# Patient Record
Sex: Male | Born: 1935 | Race: White | Hispanic: No | Smoking: Former smoker
Health system: Southern US, Community
[De-identification: ages and names within clinical notes are randomized; demographics above are authoritative.]

## PROBLEM LIST (undated history)

## (undated) DIAGNOSIS — I10 Essential (primary) hypertension: Secondary | ICD-10-CM

## (undated) DIAGNOSIS — M48062 Spinal stenosis, lumbar region with neurogenic claudication: Secondary | ICD-10-CM

## (undated) DIAGNOSIS — C44601 Unspecified malignant neoplasm of skin of unspecified upper limb, including shoulder: Secondary | ICD-10-CM

## (undated) DIAGNOSIS — E1140 Type 2 diabetes mellitus with diabetic neuropathy, unspecified: Secondary | ICD-10-CM

## (undated) DIAGNOSIS — R3915 Urgency of urination: Secondary | ICD-10-CM

## (undated) DIAGNOSIS — Z87442 Personal history of urinary calculi: Secondary | ICD-10-CM

## (undated) DIAGNOSIS — R600 Localized edema: Secondary | ICD-10-CM

## (undated) DIAGNOSIS — M549 Dorsalgia, unspecified: Secondary | ICD-10-CM

## (undated) DIAGNOSIS — E7800 Pure hypercholesterolemia, unspecified: Secondary | ICD-10-CM

## (undated) DIAGNOSIS — M4800 Spinal stenosis, site unspecified: Secondary | ICD-10-CM

## (undated) DIAGNOSIS — M1990 Unspecified osteoarthritis, unspecified site: Secondary | ICD-10-CM

## (undated) DIAGNOSIS — R252 Cramp and spasm: Secondary | ICD-10-CM

## (undated) HISTORY — DX: Unspecified malignant neoplasm of skin of unspecified upper limb, including shoulder: C44.601

## (undated) HISTORY — DX: Spinal stenosis, lumbar region with neurogenic claudication: M48.062

## (undated) HISTORY — DX: Spinal stenosis, site unspecified: M48.00

## (undated) HISTORY — DX: Type 2 diabetes mellitus with diabetic neuropathy, unspecified: E11.40

## (undated) HISTORY — DX: Essential (primary) hypertension: I10

## (undated) HISTORY — PX: SKIN CANCER EXCISION: SHX779

---

## 2000-09-23 ENCOUNTER — Ambulatory Visit (HOSPITAL_COMMUNITY): Admission: RE | Admit: 2000-09-23 | Discharge: 2000-09-23 | Payer: Self-pay | Admitting: *Deleted

## 2008-06-26 LAB — HM COLONOSCOPY

## 2011-10-14 ENCOUNTER — Encounter: Payer: Self-pay | Admitting: Emergency Medicine

## 2011-10-14 ENCOUNTER — Emergency Department (HOSPITAL_COMMUNITY): Payer: Worker's Compensation

## 2011-10-14 ENCOUNTER — Emergency Department (HOSPITAL_COMMUNITY)
Admission: EM | Admit: 2011-10-14 | Discharge: 2011-10-14 | Disposition: A | Discharge status: Home / Self Care | Payer: Worker's Compensation | Attending: Orthopedic Surgery | Admitting: Orthopedic Surgery

## 2011-10-14 DIAGNOSIS — E119 Type 2 diabetes mellitus without complications: Secondary | ICD-10-CM | POA: Insufficient documentation

## 2011-10-14 DIAGNOSIS — S8990XA Unspecified injury of unspecified lower leg, initial encounter: Secondary | ICD-10-CM | POA: Insufficient documentation

## 2011-10-14 DIAGNOSIS — M79609 Pain in unspecified limb: Secondary | ICD-10-CM | POA: Insufficient documentation

## 2011-10-14 DIAGNOSIS — S9030XA Contusion of unspecified foot, initial encounter: Secondary | ICD-10-CM | POA: Insufficient documentation

## 2011-10-14 DIAGNOSIS — Z794 Long term (current) use of insulin: Secondary | ICD-10-CM | POA: Insufficient documentation

## 2011-10-14 DIAGNOSIS — E7800 Pure hypercholesterolemia, unspecified: Secondary | ICD-10-CM | POA: Insufficient documentation

## 2011-10-14 DIAGNOSIS — W240XXA Contact with lifting devices, not elsewhere classified, initial encounter: Secondary | ICD-10-CM | POA: Insufficient documentation

## 2011-10-14 DIAGNOSIS — S97109A Crushing injury of unspecified toe(s), initial encounter: Secondary | ICD-10-CM

## 2011-10-14 DIAGNOSIS — Y990 Civilian activity done for income or pay: Secondary | ICD-10-CM | POA: Insufficient documentation

## 2011-10-14 DIAGNOSIS — Z7982 Long term (current) use of aspirin: Secondary | ICD-10-CM | POA: Insufficient documentation

## 2011-10-14 DIAGNOSIS — S99919A Unspecified injury of unspecified ankle, initial encounter: Secondary | ICD-10-CM | POA: Insufficient documentation

## 2011-10-14 DIAGNOSIS — Z79899 Other long term (current) drug therapy: Secondary | ICD-10-CM | POA: Insufficient documentation

## 2011-10-14 DIAGNOSIS — S9780XA Crushing injury of unspecified foot, initial encounter: Secondary | ICD-10-CM | POA: Insufficient documentation

## 2011-10-14 DIAGNOSIS — S92919A Unspecified fracture of unspecified toe(s), initial encounter for closed fracture: Secondary | ICD-10-CM | POA: Insufficient documentation

## 2011-10-14 DIAGNOSIS — W208XXA Other cause of strike by thrown, projected or falling object, initial encounter: Secondary | ICD-10-CM | POA: Insufficient documentation

## 2011-10-14 DIAGNOSIS — R609 Edema, unspecified: Secondary | ICD-10-CM | POA: Insufficient documentation

## 2011-10-14 HISTORY — DX: Pure hypercholesterolemia, unspecified: E78.00

## 2011-10-14 LAB — PROTIME-INR
INR: 1.03 (ref 0.00–1.49)
Prothrombin Time: 13.7 seconds (ref 11.6–15.2)

## 2011-10-14 LAB — CBC
MCH: 28.8 pg (ref 26.0–34.0)
MCHC: 34.8 g/dL (ref 30.0–36.0)
Platelets: 196 10*3/uL (ref 150–400)
RBC: 4.51 MIL/uL (ref 4.22–5.81)
RDW: 14.3 % (ref 11.5–15.5)
WBC: 10.7 10*3/uL — ABNORMAL HIGH (ref 4.0–10.5)

## 2011-10-14 LAB — BASIC METABOLIC PANEL
Calcium: 10.9 mg/dL — ABNORMAL HIGH (ref 8.4–10.5)
GFR calc Af Amer: >90 mL/min (ref >90)
GFR calc non Af Amer: 90 mL/min — ABNORMAL LOW (ref >90)
Potassium: 4.3 mEq/L (ref 3.5–5.1)
Sodium: 140 mEq/L (ref 135–145)

## 2011-10-14 LAB — APTT: aPTT: 36 seconds (ref 24–37)

## 2011-10-14 MED ORDER — SODIUM CHLORIDE 0.9 % IV SOLN
Freq: Once | INTRAVENOUS | Status: AC
  Administered 2011-10-14: 18:00:00 via INTRAVENOUS

## 2011-10-14 MED ORDER — CLINDAMYCIN PHOSPHATE 600 MG/50ML IV SOLN
600.0000 mg | Freq: Once | INTRAVENOUS | Status: AC
  Administered 2011-10-14: 600 mg via INTRAVENOUS
  Filled 2011-10-14: qty 50

## 2011-10-14 NOTE — ED Notes (Signed)
Post op boot has been applied

## 2011-10-14 NOTE — Consult Note (Signed)
77 y/o male sustained injury to his left great toe when a forklift malfunctioned at work and caused a crush injury to his foot. Pt states his toe was under the skid of the forklift for 4-5 minutes. Pt denies any other injury s/p this accident. States he has history of neuropathy to his feet due to diabetes. PMH: diabetes, neuropathy, hyperlipidemia Allergies: none Meds: see mar ROS: pain with weight bearing left lower leg, hx of neuropathy bilateral legs,  PE: alert and appropriate 77 y/o male in no acute distress. Vitals stable Left great toe with laceration just proximal to the nail bed his toe, vertical laceration down to IP joint with a 'U" shaped laceration surrounding the nail of the toe, moderate ecchymosis to distal end of the toe and to MTP areas of toes 1-3. Pt had a digital block prior to arrival thus baseline sensation unable to be determined. nv intact to other toes with mild decreased sensation. Pt walking on his heel with mild bleeding present X-rays: comminuted fracture of the left great toe of the distal phalanx Assessment: complex laceration and comminuted fracture of the left great toe. Procedure: after sterile prep and consent pt had a repeat digital block of the left great toe, thorough irrigation with 500cc of saline to toe and laceration. Pt had 3 sutures of vicryl placed into nail bed to repair the nail bed. 4.0 prolene used to suture the superficial stellate laceration leaving some opening for drainage as needed. Sterile dressing applied Plan: follow up in our office in 2 days for a wound check. Pt received IV antibiotics in the ED and given scripts for oral antibiotics and pain medications.

## 2011-10-14 NOTE — ED Notes (Signed)
Pt here after having fork arm drop on foot; pt with large laceration to left great toe; bleeding controlled with dressing bruising noted

## 2011-10-14 NOTE — Progress Notes (Signed)
Orthopedic Tech Progress Note Patient Details:  Christian [name] [birth year] [id number]  Other Ortho Devices Type of Ortho Device: Postop boot Ortho Device Location: left foot Ortho Device Interventions: Application   Nikki Dom 10/14/2011, 6:55 PM

## 2011-10-14 NOTE — ED Provider Notes (Signed)
History     CSN: [id number]  Arrival date & time 10/14/11  1424   First MD Initiated Contact with Patient 10/14/11 1554      Chief Complaint  Patient presents with  . Laceration   Pleasant, 77 year old gentleman, who is a known diabetic. Sustained an injury to his left foot, today while he was at work and the fork arms from the forklift dropped and landed on his foot. There was bleeding noted. It was controlled. A dressing. Bruising. Also noted in triage. Patient states that his tetanus is up to date. He denies any other injuries. He denies any numbness, weakness or tingling. Patient did not attempt to ambulate after this happened. (Consider location/radiation/quality/duration/timing/severity/associated sxs/prior treatment) HPI  Past Medical History  Diagnosis Date  . Diabetes mellitus   . Hypercholesterolemia     History reviewed. No pertinent past surgical history.  History reviewed. No pertinent family history.  History  Substance Use Topics  . Smoking status: Never Smoker   . Smokeless tobacco: Not on file  . Alcohol Use: No      Review of Systems  All other systems reviewed and are negative.    Allergies  Review of patient's allergies indicates no known allergies.  Home Medications   Current Outpatient Rx  Name Route Sig Dispense Refill  . ASPIRIN EC 81 MG PO TBEC Oral Take 81 mg by mouth daily.      Marland Kitchen VITAMIN D 1000 UNITS PO TABS Oral Take 1,000 Units by mouth daily.      . OMEGA-3 FATTY ACIDS 1000 MG PO CAPS Oral Take 3 g by mouth 2 (two) times daily.      Marland Kitchen GLIMEPIRIDE 4 MG PO TABS Oral Take 4 mg by mouth daily before breakfast.      . INSULIN GLARGINE 100 UNIT/ML Dean SOLN Subcutaneous Inject 10 Units into the skin at bedtime.      Marland Kitchen LISINOPRIL 10 MG PO TABS Oral Take 10 mg by mouth daily.      Marland Kitchen METFORMIN HCL 1000 MG PO TABS Oral Take 1,000 mg by mouth 2 (two) times daily with a meal.      . ADULT MULTIVITAMIN W/MINERALS CH Oral Take 1 tablet by mouth  daily.      Marland Kitchen SIMVASTATIN 80 MG PO TABS Oral Take 80 mg by mouth at bedtime.        BP 161/59  Pulse 66  Temp(Src) 97.5 F (36.4 C) (Oral)  Resp 20  SpO2 99%  Physical Exam  Nursing note and vitals reviewed. Constitutional: He is oriented to person, place, and time. He appears well-developed and well-nourished.  HENT:  Head: Normocephalic and atraumatic.  Eyes: Conjunctivae and EOM are normal. Pupils are equal, round, and reactive to light.  Neck: Neck supple.  Cardiovascular: Normal rate and regular rhythm.  Exam reveals no gallop and no friction rub.   No murmur heard. Pulmonary/Chest: Breath sounds normal. He has no wheezes. He has no rales. He exhibits no tenderness.  Abdominal: Soft. Bowel sounds are normal. He exhibits no distension. There is no tenderness. There is no rebound and no guarding.  Musculoskeletal: Normal range of motion. He exhibits edema and tenderness.       Bruising, bleeding noted.   Neurological: He is alert and oriented to person, place, and time. No cranial nerve deficit. Coordination normal.  Skin: Skin is warm and dry. No rash noted.  Psychiatric: He has a normal mood and affect.    ED Course  Procedures (including critical care time)  Labs Reviewed - No data to display No results found.   No diagnosis found.    MDM  Pt is seen and examined;  Initial history and physical completed.  Will follow.  xrays ordered, Td up to date.  Did not want pain meds at thsi time      4:38 PM  Patient has returned from x-ray. Will thoroughly irrigate and cleanse wound with the assistant medical student with normal saline and Betadine.  Digital block for now.  May need ortho repair.  No results found for this or any previous visit. Dg Foot Complete Left  10/14/2011  *RADIOLOGY REPORT*  Clinical Data: Injury of left foot.  Soft tissue lacerations involving toes and distal foot with bruising.  LEFT FOOT - COMPLETE 3+ VIEW  Comparison: None.  Findings:  Nonaneurysmal arterial calcifications are present.  There is distal soft tissue injury involving the first toe.  There is fracture of the proximal lateral articular surface of the distal phalanx at the IP joint of the first toe.  There is a comminuted fracture of the distal phalangeal tuberosity of the distal phalanx of the first toe.  No opaque foreign body is evident.  There is narrowing of the lateral joint space of the first MTP joint with spurring.  No dislocation is evident.  Posterior and plantar calcaneal spurring is present.  Dorsal navicular spurring is present.  IMPRESSION: Fracture of proximal lateral articular surface of the distal phalanx at the IP joint of the first toe.  Comminuted fracture of the distal phalangeal tuberosity of distal phalanx of first toe.  Distal soft tissue injury laceration involving first toe.  First MTP joint osteoarthritic change.  Dorsal tarsal degenerative spurring.  Calcaneal spurring.  Original Report Authenticated By: Crawford Givens, M.D.    Will page ORTHO  Ortho contacted.  Repair done by ORtho PA, Dixon.  Rx and instructions given by PA.  Discharged by myself.   Angelino Rumery A. Patrica Duel, MD 10/15/11 1535

## 2013-02-21 ENCOUNTER — Other Ambulatory Visit: Payer: Self-pay | Admitting: Family Medicine

## 2013-02-21 ENCOUNTER — Other Ambulatory Visit: Payer: Self-pay | Admitting: Physician Assistant

## 2013-02-21 NOTE — Telephone Encounter (Signed)
Medication refilled per protocol. 

## 2013-03-03 ENCOUNTER — Other Ambulatory Visit: Payer: Self-pay | Admitting: Family Medicine

## 2013-03-03 ENCOUNTER — Ambulatory Visit (INDEPENDENT_AMBULATORY_CARE_PROVIDER_SITE_OTHER): Payer: Medicare Other | Admitting: Physician Assistant

## 2013-03-03 ENCOUNTER — Encounter: Payer: Self-pay | Admitting: Physician Assistant

## 2013-03-03 VITALS — BP 136/80 | HR 72 | Temp 97.2°F | Resp 18 | Ht 66.5 in | Wt 195.0 lb

## 2013-03-03 DIAGNOSIS — E119 Type 2 diabetes mellitus without complications: Secondary | ICD-10-CM

## 2013-03-03 DIAGNOSIS — E7800 Pure hypercholesterolemia, unspecified: Secondary | ICD-10-CM

## 2013-03-03 DIAGNOSIS — I10 Essential (primary) hypertension: Secondary | ICD-10-CM | POA: Insufficient documentation

## 2013-03-03 HISTORY — DX: Essential (primary) hypertension: I10

## 2013-03-03 MED ORDER — PIOGLITAZONE HCL 45 MG PO TABS: 45.0000 mg | ORAL_TABLET | Freq: Every day | ORAL | Status: DC

## 2013-03-03 MED ORDER — NIACIN ER (ANTIHYPERLIPIDEMIC) 500 MG PO TBCR: 500.0000 mg | EXTENDED_RELEASE_TABLET | Freq: Every day | ORAL | Status: DC

## 2013-03-03 NOTE — Progress Notes (Signed)
Patient ID: Christian [name] MRN: [id number], DOB: December 06, 1934, 78 y.o. Date of Encounter: @DATE @  Chief Complaint:  Chief Complaint  Patient presents with  . routine follow up and med refills    HPI: 78 y.o. year old male  presents for routine f/u.  1-Diabetes:  He did retire one month ago but his diet/exercise have not improved as he had said they would. At prior OVs with Dr Modesto Charon he had said his diet, exercise would improve once he retired. However, today says he "can do no outdoor activity b/c he is allergic to the sun". Has a stationary bike but he rarely uses it. Regarding diet, he says "tries but it is hard to stick to it".  Says he stopped Lantus 1 montha ago b/c didn't think he needed it. Says he was sometimes getting low BS readings at night early morning-in the 60s. So, he just quit the Lantus.  Now, he checks BS every morning and every night. Morning readings are now 160 to >200. Night readings are >200 quitte often.  He is taking other meds as directed. Says that sometimes (about once a week he may forget the night dose of hi med but o/w is taking as directed.   2-Lipids: Taking meds as directed. No adv effects.   3. HTN: Taking med as directed. No LE Edema or other adv effects.    Past Medical History  Diagnosis Date  . Diabetes mellitus   . Hypercholesterolemia   . HTN (hypertension) 03/03/2013     Home Meds: See attached medication section for current medication list. Any medications entered into computer today will not appear on this note's list. The medications listed below were entered prior to today. Current Outpatient Prescriptions on File Prior to Visit  Medication Sig Dispense Refill  . aspirin EC 81 MG tablet Take 81 mg by mouth daily.        . cholecalciferol (VITAMIN D) 1000 UNITS tablet Take 1,000 Units by mouth daily.        . fish oil-omega-3 fatty acids 1000 MG capsule Take 3 g by mouth 2 (two) times daily.        Marland Kitchen glimepiride (AMARYL) 4 MG tablet  Take 4 mg by mouth 2 (two) times daily.       Marland Kitchen lisinopril (PRINIVIL,ZESTRIL) 10 MG tablet Take 10 mg by mouth daily.        . metFORMIN (GLUCOPHAGE) 1000 MG tablet Take 1,000 mg by mouth 2 (two) times daily with a meal.        . Multiple Vitamin (MULITIVITAMIN WITH MINERALS) TABS Take 1 tablet by mouth daily.        . simvastatin (ZOCOR) 80 MG tablet TAKE ONE TABLET BY MOUTH AT BEDTIME  30 tablet  2  . insulin glargine (LANTUS) 100 UNIT/ML injection Inject 10 Units into the skin at bedtime.         No current facility-administered medications on file prior to visit.    Allergies: No Known Allergies  History   Social History  . Marital Status: Single    Spouse Name: N/A    Number of Children: N/A  . Years of Education: N/A   Occupational History  . Not on file.   Social History Main Topics  . Smoking status: Former Smoker    Quit date: 03/04/2003  . Smokeless tobacco: Never Used  . Alcohol Use: No  . Drug Use: No  . Sexually Active: Not on file   Other Topics  Concern  . Not on file   Social History Narrative  . No narrative on file    No family history on file.   Review of Systems:  See HPI for pertinent ROS. All other ROS negative.    Physical Exam: Blood pressure 136/80, pulse 72, temperature 97.2 F (36.2 C), temperature source Oral, resp. rate 18, height 5' 6.5" (1.689 m), weight 195 lb (88.451 kg)., Body mass index is 31.01 kg/(m^2). General: WNWD WM. Appears in no acute distress. Neck: Supple. No thyromegaly. No lymphadenopathy. No Carotid Bruits. Lungs: Clear bilaterally to auscultation without wheezes, rales, or rhonchi. Breathing is unlabored. Heart: RRR with S1 S2. No murmurs, rubs, or gallops. Abdomen: Soft, non-tender, non-distended with normoactive bowel sounds. No hepatomegaly. No rebound/guarding. No obvious abdominal masses. Musculoskeletal:  Strength and tone normal for age. Extremities/Skin: Warm and dry. No clubbing or cyanosis. No edema. No  rashes or suspicious lesions. Neuro: Alert and oriented X 3. Moves all extremities spontaneously. Gait is normal. CNII-XII grossly in tact. Psych:  Responds to questions appropriately with a normal affect. Diabetic Foot Exam: Inspection: Nml. Sensation : Nml. 2+ Right DP. 1 + Left DP. No palpable PT pulse Bilaterally.      ASSESSMENT AND PLAN:  78 y.o. year old male with  1. Diabetes See HPI. Pt quit Lantus. He is to discuss with Pharmacy cost of Actos vs cost of Lantus. Will check A1C then decide what changes to make to currnet meds.  - Microalbumin, urine - Hemoglobin A1C, fingerstick  2. HTN (hypertension) At Goal. On ACE Inh. Had BMET LOV. Repeat this next OV  3. Hypercholesterolemia Was at goal at last lab. Cont currnet meds. Needs refill on niaspan now.  - niacin (NIASPAN) 500 MG CR tablet; Take 1 tablet (500 mg total) by mouth at bedtime.  Dispense: 30 tablet; Refill: 5  ROV 3 months.   [address] [place], [place], [facility] 03/03/2013 2:12 PM

## 2013-03-04 ENCOUNTER — Telehealth: Payer: Self-pay | Admitting: Family Medicine

## 2013-03-04 MED ORDER — PIOGLITAZONE HCL 15 MG PO TABS: 15.0000 mg | ORAL_TABLET | Freq: Every day | ORAL | Status: DC

## 2013-03-04 NOTE — Telephone Encounter (Signed)
Message copied by Donne Anon on Fri Mar 04, 2013  9:15 AM ------      Message from: Allayne Butcher      Created: Fri Mar 04, 2013  8:03 AM       At his OV, we discussed cost of meds. I sent Rx for Actos 45mg  to pharmacy just so he could ask pharmacy cost of Actos vs Lantus. He called the office yesterday and told Morrie Sheldon that he wanted to do the Actos. Tell him that the 45 mg dose may be too much. I hope he did not already pick up that Rx. I recommend Actos 15mg  . Tell him A1C and urine test were good.      Send Rx for Actos 15mg  one po QD # 30/ 5 ------

## 2013-03-04 NOTE — Telephone Encounter (Signed)
Message copied by Donne Anon on Fri Mar 04, 2013  9:16 AM ------      Message from: Allayne Butcher      Created: Fri Mar 04, 2013  8:03 AM       At his OV, we discussed cost of meds. I sent Rx for Actos 45mg  to pharmacy just so he could ask pharmacy cost of Actos vs Lantus. He called the office yesterday and told Morrie Sheldon that he wanted to do the Actos. Tell him that the 45 mg dose may be too much. I hope he did not already pick up that Rx. I recommend Actos 15mg  . Tell him A1C and urine test were good.      Send Rx for Actos 15mg  one po QD # 30/ 5 ------

## 2013-03-04 NOTE — Telephone Encounter (Signed)
Patient was called.  He had not picked up new Rx Actos 45 mg yet.  Told patient based on lab results the provider wants to decrease the Actos to 15 mg per day.  Told to continue to monitor sugar levels and to call us if any abnormal highs or lows.  Make f/u appt for 3 months.  Pharmacy was called and RX was changed verbally with pharmacist.

## 2013-03-29 ENCOUNTER — Telehealth: Payer: Self-pay | Admitting: Physician Assistant

## 2013-03-30 MED ORDER — METFORMIN HCL 1000 MG PO TABS: 1000.0000 mg | ORAL_TABLET | Freq: Two times a day (BID) | ORAL | Status: DC

## 2013-03-30 NOTE — Telephone Encounter (Signed)
Medication refilled per protocol. 

## 2013-04-20 DIAGNOSIS — C44601 Unspecified malignant neoplasm of skin of unspecified upper limb, including shoulder: Secondary | ICD-10-CM

## 2013-04-20 HISTORY — DX: Unspecified malignant neoplasm of skin of unspecified upper limb, including shoulder: C44.601

## 2013-05-25 ENCOUNTER — Other Ambulatory Visit: Payer: Self-pay | Admitting: Physician Assistant

## 2013-05-25 ENCOUNTER — Encounter: Payer: Self-pay | Admitting: Family Medicine

## 2013-05-25 NOTE — Telephone Encounter (Signed)
Medication refill for one time only.  Patient needs to be seen.  Letter sent for patient to call and schedule 

## 2013-05-30 LAB — HM DIABETES EYE EXAM

## 2013-05-31 ENCOUNTER — Telehealth: Payer: Self-pay | Admitting: Physician Assistant

## 2013-05-31 MED ORDER — LISINOPRIL 10 MG PO TABS: 10.0000 mg | ORAL_TABLET | Freq: Every day | ORAL | Status: DC

## 2013-05-31 NOTE — Telephone Encounter (Signed)
Lisinopril 10mg

## 2013-05-31 NOTE — Telephone Encounter (Signed)
F/u appt next month  One month refill sent

## 2013-06-08 ENCOUNTER — Ambulatory Visit (INDEPENDENT_AMBULATORY_CARE_PROVIDER_SITE_OTHER): Payer: Medicare Other | Admitting: Physician Assistant

## 2013-06-08 ENCOUNTER — Encounter: Payer: Self-pay | Admitting: Physician Assistant

## 2013-06-08 VITALS — BP 100/56 | HR 60 | Temp 97.7°F | Resp 18 | Wt 203.0 lb

## 2013-06-08 DIAGNOSIS — E1142 Type 2 diabetes mellitus with diabetic polyneuropathy: Secondary | ICD-10-CM

## 2013-06-08 DIAGNOSIS — E7800 Pure hypercholesterolemia, unspecified: Secondary | ICD-10-CM

## 2013-06-08 DIAGNOSIS — M791 Myalgia, unspecified site: Secondary | ICD-10-CM

## 2013-06-08 DIAGNOSIS — E119 Type 2 diabetes mellitus without complications: Secondary | ICD-10-CM

## 2013-06-08 DIAGNOSIS — E1149 Type 2 diabetes mellitus with other diabetic neurological complication: Secondary | ICD-10-CM

## 2013-06-08 DIAGNOSIS — I10 Essential (primary) hypertension: Secondary | ICD-10-CM

## 2013-06-08 DIAGNOSIS — IMO0001 Reserved for inherently not codable concepts without codable children: Secondary | ICD-10-CM

## 2013-06-08 DIAGNOSIS — E1140 Type 2 diabetes mellitus with diabetic neuropathy, unspecified: Secondary | ICD-10-CM

## 2013-06-08 LAB — HEMOGLOBIN A1C: Hgb A1c MFr Bld: 6.2 % — ABNORMAL HIGH (ref <5.7)

## 2013-06-08 LAB — COMPLETE METABOLIC PANEL WITH GFR
Alkaline Phosphatase: 46 U/L (ref 39–117)
BUN: 21 mg/dL (ref 6–23)
CO2: 25 mEq/L (ref 19–32)
Creat: 0.92 mg/dL (ref 0.50–1.35)
GFR, Est African American: >89 mL/min
GFR, Est Non African American: 81 mL/min
Glucose, Bld: 111 mg/dL — ABNORMAL HIGH (ref 70–99)
Sodium: 141 mEq/L (ref 135–145)
Total Bilirubin: 0.4 mg/dL (ref 0.3–1.2)
Total Protein: 6.6 g/dL (ref 6.0–8.3)

## 2013-06-08 LAB — LIPID PANEL
Cholesterol: 117 mg/dL (ref 0–200)
LDL Cholesterol: 52 mg/dL (ref 0–99)
VLDL: 24 mg/dL (ref 0–40)

## 2013-06-08 MED ORDER — GLIMEPIRIDE 2 MG PO TABS: ORAL_TABLET | ORAL | Status: DC

## 2013-06-08 MED ORDER — GABAPENTIN 300 MG PO CAPS: 300.0000 mg | ORAL_CAPSULE | Freq: Three times a day (TID) | ORAL | Status: DC

## 2013-06-08 NOTE — Progress Notes (Signed)
Patient ID: Christian [name] MRN: [id number], DOB: 07-05-1935, 78 y.o. Date of Encounter: @DATE @  Chief Complaint:  Chief Complaint  Patient presents with  . 3 month check up    HPI: 78 y.o. year old white male  presents for a routine followup office visit.  #1 diabetes: At his last office visit on 03/03/2013 we discussed restarting Lantus versus starting Actos. He did check with the pharmacy regarding cost and they were to be very similar. He did go ahead and start the activities. He is taking that daily in addition to his other diabetic medicines which include metformin and Amaryl. Diet: He says he has had difficulty "sticking to it quite. However he says he has cut out bread. And if he does eat any it is whole-grain.  He still is doing no exercise. His only activity includes working in the yard and in his shop. He does check his blood sugar 3 times a day every day. He says at that 30 day average is 123. He does report that several times when he has been out doing yard while working in his shop he is felt shaky checked his blood sugar and gynecologist is 60. He then doesn't drink some orange juice and it resolves. Asked him what time of day this is. He says that it is always in the evening between 5 and 8 PM. He says that his fasting blood sugars in the morning her never too low Discussed that he must eat regularly and not wait 5-6 hours between meals. He needs to be eating before he goes out to do the daughter brought her shop work.  Today I did ask him if he is having any type of pain burning or pins and needles sensation in his feet. He says he does have a constant pain in the bottoms of his feet year when he's at rest.   Hyperlipidemia: He is taking the medications as directed. No adverse effects. No myalgias. He is on Zocor 80 as well as fish oil and Niaspan.  Hypertension: He is taking medications as directed. He has no lower extremity edema. As well he never had lightheadedness  or presyncope.   Past Medical History  Diagnosis Date  . Diabetes mellitus   . Hypercholesterolemia   . HTN (hypertension) 03/03/2013  . Diabetic neuropathy      Home Meds: See attached medication section for current medication list. Any medications entered into computer today will not appear on this note's list. The medications listed below were entered prior to today. Current Outpatient Prescriptions on File Prior to Visit  Medication Sig Dispense Refill  . aspirin EC 81 MG tablet Take 81 mg by mouth daily.        . cholecalciferol (VITAMIN D) 1000 UNITS tablet Take 1,000 Units by mouth daily.        . fish oil-omega-3 fatty acids 1000 MG capsule Take 3 g by mouth 2 (two) times daily.        Marland Kitchen lisinopril (PRINIVIL,ZESTRIL) 10 MG tablet Take 1 tablet (10 mg total) by mouth daily.  30 tablet  0  . metFORMIN (GLUCOPHAGE) 1000 MG tablet Take 1 tablet (1,000 mg total) by mouth 2 (two) times daily with a meal.  60 tablet  5  . Multiple Vitamin (MULITIVITAMIN WITH MINERALS) TABS Take 1 tablet by mouth daily.        . niacin (NIASPAN) 500 MG CR tablet Take 1 tablet (500 mg total) by mouth at bedtime.  30  tablet  5  . pioglitazone (ACTOS) 15 MG tablet Take 1 tablet (15 mg total) by mouth daily.  30 tablet  5  . simvastatin (ZOCOR) 80 MG tablet TAKE ONE TABLET BY MOUTH AT BEDTIME  30 tablet  0   No current facility-administered medications on file prior to visit.    Allergies: No Known Allergies  History   Social History  . Marital Status: Single    Spouse Name: N/A    Number of Children: N/A  . Years of Education: N/A   Occupational History  . Not on file.   Social History Main Topics  . Smoking status: Former Smoker    Quit date: 03/04/2003  . Smokeless tobacco: Never Used  . Alcohol Use: No  . Drug Use: No  . Sexual Activity: Not on file   Other Topics Concern  . Not on file   Social History Narrative  . No narrative on file    No family history on file.   Review of  Systems:  See HPI for pertinent ROS. All other ROS negative.    Physical Exam: Blood pressure 100/56, pulse 60, temperature 97.7 F (36.5 C), temperature source Oral, resp. rate 18, weight 203 lb (92.08 kg)., Body mass index is 32.28 kg/(m^2). General: Well-nourished well-developed white male . Appears in no acute distress.  Neck: Supple. No thyromegaly. No lymphadenopathy. No carotid bruit. Lungs: Clear bilaterally to auscultation without wheezes, rales, or rhonchi. Breathing is unlabored. Heart: RRR with S1 S2. No murmurs, rubs, or gallops. Abdomen: Soft, non-tender, non-distended with normoactive bowel sounds. No hepatomegaly. No rebound/guarding. No obvious abdominal masses. Musculoskeletal:  Strength and tone normal for age. Extremities/Skin: Warm and dry. No clubbing or cyanosis. No edema. No rashes or suspicious lesions. Neuro: Alert and oriented X 3. Moves all extremities spontaneously. Gait is normal. CNII-XII grossly in tact. Psych:  Responds to questions appropriately with a normal affect.     ASSESSMENT AND PLAN:  78 y.o. year old male with  1. Diabetes Microalbumin: Done 03/03/2013 - COMPLETE METABOLIC PANEL WITH GFR - Hemoglobin A1c Discussed with him at length that he needs to eat regularly every 4 hours. He should be eating 5 small meals/snacks per day. He especially needs to make sure that he has eaten within several hours if he is going to be doing activity outside in and her shop. However given that he says that his 30 day average glucose was 123 according to his monitor or an event that he is having episodes of some low readings we'll go ahead and decrease his Amaryl dose. He had been taking 4 mg twice a day. Decrease this to 2 mg twice a day. Call me if he has continues to have any episodes of low blood sugars in your 60 or consistently less than 80. - glimepiride (AMARYL) 2 MG tablet; Take One Twice a Day  Dispense: 60 tablet; Refill: 5  2. HTN (hypertension) BP  at goal. On ACE inhibitor. Nurse got a lower reading than usual today. Blood pressure is usually good. As well patient denies any lightheadedness. Continue current medication without change. - COMPLETE METABOLIC PANEL WITH GFR  3. Hypercholesterolemia On Zocor 80, fish oil, Niaspan. He did have coffee which had nondairy creamer and Splenda. This was about 2-1/2 hours ago he did drink 16 ounces. Otherwise is fasting so we'll go ahead and check lab. - COMPLETE METABOLIC PANEL WITH GFR - Lipid panel  4. Diabetic neuropathy We'll start Neurontin. He is going to  take 1 each bedtime on day one. Will increase to 1 twice a day on day 2. He will then increase to one by mouth 3 times a day. I've instructed him of these directions. - gabapentin (NEURONTIN) 300 MG capsule; Take 1 capsule (300 mg total) by mouth 3 (three) times daily.  Dispense: 90 capsule; Refill: 3  5. Myalgia When I asked him about the pain in his feet- then mentions that he does get some cramps in his legs. I will check a CK given he is on statin therapy. However this has not been original complaints of his andthis  does not seem to be significant . The CK is normal then can continue statin therapy. - CK  #6 screening PSA normal 10 2012. Would not repeat given his advanced age of 72.  #7 screening colonoscopy: He reports that his last one was 5 years ago and is due to repeat. He onset he is going to call him in the near future to schedule this.  #8 vaccine: Pneumovax: 05/01/2008 here Tetanus: 07/23/2011 given here Zostavax: He needs to check with his insurance regarding cost of this in followup. Flu vaccine he will need this at his next visit as it will then be flu season.    [address] [place], [place], [facility] 06/08/2013 1:03 PM

## 2013-06-21 ENCOUNTER — Telehealth: Payer: Self-pay | Admitting: Family Medicine

## 2013-06-21 MED ORDER — SIMVASTATIN 40 MG PO TABS: 40.0000 mg | ORAL_TABLET | Freq: Every day | ORAL | Status: DC

## 2013-06-21 NOTE — Telephone Encounter (Signed)
Message copied by Donne Anon on Tue Jun 21, 2013 12:08 PM ------      Message from: Allayne Butcher      Created: Fri Jun 17, 2013 12:26 PM       His cholesterol has gotten really low. Currently on Simvastatin 80mg . Can decrease his to 40mg .      Send new Rx-6 month supply      Rest of labs are great. ------

## 2013-06-21 NOTE — Telephone Encounter (Signed)
Pt aware of lab results and lowering of Simvastatin.  New Rx to pharmacy.

## 2013-06-27 ENCOUNTER — Encounter: Payer: Self-pay | Admitting: Family Medicine

## 2013-06-27 DIAGNOSIS — Z961 Presence of intraocular lens: Secondary | ICD-10-CM | POA: Insufficient documentation

## 2013-06-28 ENCOUNTER — Other Ambulatory Visit: Payer: Self-pay | Admitting: Physician Assistant

## 2013-08-24 ENCOUNTER — Other Ambulatory Visit: Payer: Self-pay | Admitting: Physician Assistant

## 2013-08-29 ENCOUNTER — Other Ambulatory Visit: Payer: Self-pay | Admitting: Physician Assistant

## 2013-08-29 DIAGNOSIS — E1349 Other specified diabetes mellitus with other diabetic neurological complication: Secondary | ICD-10-CM

## 2013-08-29 NOTE — Telephone Encounter (Signed)
Test strips refilled

## 2013-09-05 ENCOUNTER — Other Ambulatory Visit: Payer: Self-pay | Admitting: Physician Assistant

## 2013-09-05 NOTE — Telephone Encounter (Signed)
Medication refilled per protocol. 

## 2013-09-07 ENCOUNTER — Encounter: Payer: Self-pay | Admitting: Physician Assistant

## 2013-09-07 ENCOUNTER — Ambulatory Visit (INDEPENDENT_AMBULATORY_CARE_PROVIDER_SITE_OTHER): Payer: Medicare Other | Admitting: Physician Assistant

## 2013-09-07 VITALS — BP 126/68 | HR 76 | Temp 97.6°F | Resp 18 | Wt 213.0 lb

## 2013-09-07 DIAGNOSIS — E1142 Type 2 diabetes mellitus with diabetic polyneuropathy: Secondary | ICD-10-CM

## 2013-09-07 DIAGNOSIS — I10 Essential (primary) hypertension: Secondary | ICD-10-CM

## 2013-09-07 DIAGNOSIS — Z23 Encounter for immunization: Secondary | ICD-10-CM

## 2013-09-07 DIAGNOSIS — E119 Type 2 diabetes mellitus without complications: Secondary | ICD-10-CM

## 2013-09-07 DIAGNOSIS — E1149 Type 2 diabetes mellitus with other diabetic neurological complication: Secondary | ICD-10-CM

## 2013-09-07 DIAGNOSIS — E7800 Pure hypercholesterolemia, unspecified: Secondary | ICD-10-CM

## 2013-09-07 DIAGNOSIS — E1140 Type 2 diabetes mellitus with diabetic neuropathy, unspecified: Secondary | ICD-10-CM

## 2013-09-07 LAB — HEMOGLOBIN A1C, FINGERSTICK: Hgb A1C (fingerstick): 6.1 % — ABNORMAL HIGH (ref <5.7)

## 2013-09-07 MED ORDER — GABAPENTIN 600 MG PO TABS: 600.0000 mg | ORAL_TABLET | Freq: Three times a day (TID) | ORAL | Status: DC

## 2013-09-07 NOTE — Progress Notes (Signed)
Patient ID: KALIQ LEGE MRN: [id number], DOB: 27-Mar-1935, 78 y.o. Date of Encounter: @DATE @  Chief Complaint:  Chief Complaint  Patient presents with  . 3 mth check up    is fasting    HPI: 78 y.o. year old male  presents for ROV  DM:  At LOV he reportd that some times around 5-6 pm he was feeling shaky and would check BS and would get 60s. Discussed need to eat routinely every 4 hours. Also, did go ahead and decrease Amaryl from 4mg  BID to 2mg  BID. Pt says he did make this change. Says he tries to remember to eat regularlay but sometimes "not hungry so forgets to eat". 2-3 times has "gottne low BS--70s"- since LOV.  One month BS average is 143. Fasting this AM was 123.   HTN:  Taking meds as directed. No adv effects.  HLD:  At last lab LDL was very low. Saidcould decrease Zocor 80 to 40. Pt did make this change.   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   At LOV started neurontin for Diabetic Neuropathy. Pt says htat helped A LOT. However, symptoms not completley resolved.    Past Medical History  Diagnosis Date  . Diabetes mellitus   . Hypercholesterolemia   . HTN (hypertension) 03/03/2013  . Diabetic neuropathy      Home Meds: See attached medication section for current medication list. Any medications entered into computer today will not appear on this note's list. The medications listed below were entered prior to today. Current Outpatient Prescriptions on File Prior to Visit  Medication Sig Dispense Refill  . ACCU-CHEK AVIVA PLUS test strip USE ONE STRIP TO CHECK GLUCOSE TWICE DAILY  100 each  11  . aspirin EC 81 MG tablet Take 81 mg by mouth daily.        . cholecalciferol (VITAMIN D) 1000 UNITS tablet Take 1,000 Units by mouth daily.        . Cyanocobalamin (B-12 PO) Take 1 tablet by mouth daily.      . fish oil-omega-3 fatty acids 1000 MG capsule Take 3 g by mouth 2 (two) times daily.        Marland Kitchen glimepiride (AMARYL) 2 MG tablet Take One  Twice a Day  60 tablet  5  . lisinopril (PRINIVIL,ZESTRIL) 10 MG tablet TAKE ONE TABLET BY MOUTH ONCE DAILY  30 tablet  5  . metFORMIN (GLUCOPHAGE) 1000 MG tablet Take 1 tablet (1,000 mg total) by mouth 2 (two) times daily with a meal.  60 tablet  5  . Multiple Vitamin (MULITIVITAMIN WITH MINERALS) TABS Take 1 tablet by mouth daily.        . niacin (NIASPAN) 500 MG CR tablet TAKE ONE TABLET BY MOUTH AT BEDTIME  30 tablet  5  . pioglitazone (ACTOS) 15 MG tablet TAKE ONE TABLET BY MOUTH ONCE DAILY  30 tablet  1  . potassium gluconate 595 MG TABS tablet Take 595 mg by mouth daily.      . simvastatin (ZOCOR) 40 MG tablet Take 1 tablet (40 mg total) by mouth at bedtime.  30 tablet  5   No current facility-administered medications on file prior to visit.    Allergies: No Known Allergies  History   Social History  . Marital Status: Single    Spouse Name: N/A    Number of Children: N/A  . Years of Education: N/A   Occupational History  . Not on file.  Social History Main Topics  . Smoking status: Former Smoker    Quit date: 03/04/2003  . Smokeless tobacco: Never Used  . Alcohol Use: No  . Drug Use: No  . Sexual Activity: Not on file   Other Topics Concern  . Not on file   Social History Narrative  . No narrative on file    No family history on file.   Review of Systems:  See HPI for pertinent ROS. All other ROS negative.    Physical Exam: Blood pressure 126/68, pulse 76, temperature 97.6 F (36.4 C), temperature source Oral, resp. rate 18, weight 213 lb (96.616 kg)., Body mass index is 33.87 kg/(m^2). General:Obese WM Appears in no acute distress. Neck: Supple. No thyromegaly. No lymphadenopathy.No carotid bruit.  Lungs: Clear bilaterally to auscultation without wheezes, rales, or rhonchi. Breathing is unlabored. Heart: RRR with S1 S2. No murmurs, rubs, or gallops. Abdomen: Soft, non-tender, non-distended with normoactive bowel sounds. No hepatomegaly. No  rebound/guarding. No obvious abdominal masses. Musculoskeletal:  Strength and tone normal for age. Extremities/Skin: Warm and dry. No clubbing or cyanosis. No edema. No rashes or suspicious lesions. Neuro: Alert and oriented X 3. Moves all extremities spontaneously. Gait is normal. CNII-XII grossly in tact. Psych:  Responds to questions appropriately with a normal affect. Diabetic Foot Exam: Inspection: nml.  Sensation: intact. 2+ Right DP. 1+ Left DP.   No palpable PT pulses bilaterally     ASSESSMENT AND PLAN:  78 y.o. year old male with  1. Diabetes - Hemoglobin A1C, fingerstick MicroAlbumin 03/03/13 Cont to try to eat routinely. Will not furhter decrease Amaryl now but will do so if cont to have low/lower readings  2. Diabetic neuropathy Will gradually increase dose. Currently on 300 TID.  Day 1: Take 300, 300, 600 HS Day 2: 600, 300, 600 Day 3: 600 tid - gabapentin (NEURONTIN) 600 MG tablet; Take 1 tablet (600 mg total) by mouth 3 (three) times daily.  Dispense: 90 tablet; Refill: 3  3. Hypercholesterolemia Pt did decrese dose of statin as directed at last lab.  4. HTN (hypertension) At goal.  5. Screening PSAS: Nml 07/2011.    Will not repeat given advanced age of 25  6. Screening Colonsocopy: AtLOV pt says due for f/u. Today ssys he forgot to call to sched f/u. I wrote this down on his AVS for his "homework' today  7. Vaccines: Pneumovax: 05/01/2008: here Tetanus: 07/23/2011: here Zostavax: He checked with insurance: cost $200-pt defers Flu vaccine: pt agreeable to get today   5. Need for prophylactic vaccination and inoculation against influenza - Flu Vaccine QUAD 36+ mos PF IM (Fluarix)   Signed, [address] [place], [place], [facility] 09/07/2013 9:18 AM

## 2013-09-08 ENCOUNTER — Telehealth: Payer: Self-pay | Admitting: Family Medicine

## 2013-09-08 NOTE — Telephone Encounter (Signed)
Message copied by Donne Anon on Thu Sep 08, 2013  4:53 PM ------      Message from: Allayne Butcher      Created: Wed Sep 07, 2013  2:04 PM       Tell him to stop the Amaryl. I am afraid he is going to get hypoglycemia.       Stop the Amaryl but continue all other medications the same. ------

## 2013-09-08 NOTE — Telephone Encounter (Signed)
Pt aware of lab results and provider recommendations.  Amaryl removed from medication list.  Pt has 3 mth f/u appt

## 2013-10-04 ENCOUNTER — Other Ambulatory Visit: Payer: Self-pay | Admitting: Physician Assistant

## 2013-10-18 ENCOUNTER — Other Ambulatory Visit: Payer: Self-pay | Admitting: Physician Assistant

## 2013-10-28 ENCOUNTER — Encounter: Payer: Self-pay | Admitting: Family Medicine

## 2013-11-04 ENCOUNTER — Other Ambulatory Visit: Payer: Self-pay | Admitting: Physician Assistant

## 2013-11-04 NOTE — Telephone Encounter (Signed)
Medication refilled per protocol. 

## 2013-11-17 ENCOUNTER — Other Ambulatory Visit: Payer: Self-pay | Admitting: Physician Assistant

## 2013-11-17 DIAGNOSIS — E119 Type 2 diabetes mellitus without complications: Secondary | ICD-10-CM

## 2013-11-17 NOTE — Telephone Encounter (Signed)
Medication refilled per protocol. 

## 2013-12-07 ENCOUNTER — Encounter: Payer: Self-pay | Admitting: Physician Assistant

## 2013-12-07 ENCOUNTER — Ambulatory Visit (INDEPENDENT_AMBULATORY_CARE_PROVIDER_SITE_OTHER): Payer: Medicare Other | Admitting: Physician Assistant

## 2013-12-07 VITALS — BP 124/68 | HR 68 | Temp 97.1°F | Resp 18 | Ht 66.5 in | Wt 213.0 lb

## 2013-12-07 DIAGNOSIS — E119 Type 2 diabetes mellitus without complications: Secondary | ICD-10-CM

## 2013-12-07 DIAGNOSIS — I10 Essential (primary) hypertension: Secondary | ICD-10-CM

## 2013-12-07 DIAGNOSIS — E1140 Type 2 diabetes mellitus with diabetic neuropathy, unspecified: Secondary | ICD-10-CM

## 2013-12-07 DIAGNOSIS — Z23 Encounter for immunization: Secondary | ICD-10-CM

## 2013-12-07 DIAGNOSIS — E1149 Type 2 diabetes mellitus with other diabetic neurological complication: Secondary | ICD-10-CM

## 2013-12-07 DIAGNOSIS — E1142 Type 2 diabetes mellitus with diabetic polyneuropathy: Secondary | ICD-10-CM

## 2013-12-07 DIAGNOSIS — E7800 Pure hypercholesterolemia, unspecified: Secondary | ICD-10-CM

## 2013-12-07 LAB — COMPLETE METABOLIC PANEL WITH GFR
ALK PHOS: 53 U/L (ref 39–117)
ALT: 16 U/L (ref 0–53)
AST: 15 U/L (ref 0–37)
Albumin: 4.4 g/dL (ref 3.5–5.2)
BILIRUBIN TOTAL: 0.5 mg/dL (ref 0.2–1.2)
BUN: 21 mg/dL (ref 6–23)
CO2: 25 meq/L (ref 19–32)
CREATININE: 0.94 mg/dL (ref 0.50–1.35)
Calcium: 9.6 mg/dL (ref 8.4–10.5)
Chloride: 105 mEq/L (ref 96–112)
GFR, Est African American: >89 mL/min
GFR, Est Non African American: 78 mL/min
Glucose, Bld: 172 mg/dL — ABNORMAL HIGH (ref 70–99)
Potassium: 4.6 mEq/L (ref 3.5–5.3)
Sodium: 142 mEq/L (ref 135–145)
TOTAL PROTEIN: 6.6 g/dL (ref 6.0–8.3)

## 2013-12-07 LAB — LIPID PANEL
CHOL/HDL RATIO: 3.8 ratio
Cholesterol: 159 mg/dL (ref 0–200)
HDL: 42 mg/dL (ref >39)
LDL Cholesterol: 92 mg/dL (ref 0–99)
TRIGLYCERIDES: 123 mg/dL (ref <150)
VLDL: 25 mg/dL (ref 0–40)

## 2013-12-07 LAB — HEMOGLOBIN A1C
HEMOGLOBIN A1C: 8 % — AB (ref <5.7)
Mean Plasma Glucose: 183 mg/dL — ABNORMAL HIGH (ref <117)

## 2013-12-07 NOTE — Progress Notes (Signed)
Patient ID: Christian [name] MRN: [id number], DOB: 11-Jun-1935, 79 y.o. Date of Encounter: @DATE @  Chief Complaint: No chief complaint on file.   HPI: 79 y.o. year old white malehite male  presents for routine followup visit.  Diabetes: At his last office visit 09/07/13 he actually was reporting that sometimes around 5 or 6 PM he was feeling shaky and when checked his blood sugar --he would get it in the 60s. See that note for further details of that.  However today he says that for the past 2 months all of a sudden his sugars have been reading much higher.  Says that he checks his blood sugar 3 times a day: Fasting morning, around 1:00 PM before eating lunch, and around 6 to 7 PM before eating dinner. Says that all 3 times a day he is getting high readings usually between 200-300. Says this morning fasting reading was 167. Says this is about as low as it has gotten in the last 2 months. Says that his meter and battery are very old and he is wondering whether his meter is an accurate.  I told him we will check an A1c today and see whether it reflects the above high readings or not. If A1c is high as well then we will make adjustments in his medicines. If A1c is low then we will get him a new meter and new battery and see whether his blood sugar readings change with that or not.  Hypertension: Taking meds as directed. No adverse effects.  Hyperlipidemia: At lab 9/14 recommended decrease Zocor from  80 to 40. He did make that change.  Office visit 9/14 started Neurontin for diabetic neuropathy. At office visit 12/14 he reported that the medicine had helped a lot. However symptoms were not completely resolved. Therefore at that visit 12/14 be further titrated up the Neurontin. He says that he did increase that dose is and is up to 600 mg 3 times a day. Says that the discomfort in his feet is much improved and he claims that he was experiencing in his feet have resolved.  He does no activity except  for yard work and working in his shop at times.   Past Medical History  Diagnosis Date  . Diabetes mellitus   . Hypercholesterolemia   . HTN (hypertension) 03/03/2013  . Diabetic neuropathy   . Skin cancer of arm 04/20/2013    left upper arm     Home Meds: See attached medication section for current medication list. Any medications entered into computer today will not appear on this note's list. The medications listed below were entered prior to today. Current Outpatient Prescriptions on File Prior to Visit  Medication Sig Dispense Refill  . ACCU-CHEK AVIVA PLUS test strip USE ONE STRIP TO CHECK GLUCOSE TWICE DAILY  100 each  11  . aspirin EC 81 MG tablet Take 81 mg by mouth daily.        . cholecalciferol (VITAMIN D) 1000 UNITS tablet Take 1,000 Units by mouth daily.        . Cyanocobalamin (B-12 PO) Take 1 tablet by mouth daily.      . fish oil-omega-3 fatty acids 1000 MG capsule Take 3 g by mouth 2 (two) times daily.        Marland Kitchen gabapentin (NEURONTIN) 600 MG tablet Take 1 tablet (600 mg total) by mouth 3 (three) times daily.  90 tablet  3  . lisinopril (PRINIVIL,ZESTRIL) 10 MG tablet TAKE ONE TABLET BY MOUTH  ONCE DAILY  30 tablet  5  . metFORMIN (GLUCOPHAGE) 1000 MG tablet TAKE ONE TABLET BY MOUTH TWICE DAILY WITH A MEAL  60 tablet  2  . Multiple Vitamin (MULITIVITAMIN WITH MINERALS) TABS Take 1 tablet by mouth daily.        . niacin (NIASPAN) 500 MG CR tablet TAKE ONE TABLET BY MOUTH AT BEDTIME  30 tablet  5  . pioglitazone (ACTOS) 15 MG tablet TAKE ONE TABLET BY MOUTH ONCE DAILY  30 tablet  0  . potassium gluconate 595 MG TABS tablet Take 595 mg by mouth daily.      . simvastatin (ZOCOR) 40 MG tablet Take 1 tablet (40 mg total) by mouth at bedtime.  30 tablet  5   No current facility-administered medications on file prior to visit.    Allergies: No Known Allergies  History   Social History  . Marital Status: Single    Spouse Name: N/A    Number of Children: N/A  . Years of  Education: N/A   Occupational History  . Not on file.   Social History Main Topics  . Smoking status: Former Smoker    Quit date: 03/04/2003  . Smokeless tobacco: Never Used  . Alcohol Use: No  . Drug Use: No  . Sexual Activity: Not on file   Other Topics Concern  . Not on file   Social History Narrative  . No narrative on file    No family history on file.   Review of Systems:  See HPI for pertinent ROS. All other ROS negative.    Physical Exam: Blood pressure 124/68, pulse 68, temperature 97.1 F (36.2 C), temperature source Oral, resp. rate 18, height 5' 6.5" (1.689 m), weight 213 lb (96.616 kg)., Body mass index is 33.87 kg/(m^2). General: WM. Abdominal Obesity--Moderate. Appears in no acute distress. Neck: Supple. No thyromegaly. No lymphadenopathy.No carotid bruit.  Lungs: Clear bilaterally to auscultation without wheezes, rales, or rhonchi. Breathing is unlabored. Heart: RRR with S1 S2. No murmurs, rubs, or gallops. Abdomen: Soft, non-tender, non-distended with normoactive bowel sounds. No hepatomegaly. No rebound/guarding. No obvious abdominal masses. Musculoskeletal:  Strength and tone normal for age. Extremities/Skin: Warm and dry. No clubbing or cyanosis. No edema. No rashes or suspicious lesions. Neuro: Alert and oriented X 3. Moves all extremities spontaneously. Gait is normal. CNII-XII grossly in tact. Psych:  Responds to questions appropriately with a normal affect. Diabetic foot exam: Inspection: He has a lot of scaly skin. However no open wound or sores. Sensation is intact. Trace bilateral  dorsalis pedis pulses . No palpable posterior tibial pulses bilaterally.      ASSESSMENT AND PLAN:  79 y.o. year old male with  1. Diabetes - COMPLETE METABOLIC PANEL WITH GFR - Hemoglobin A1c  Microalbumin 03/03/13 He does have annual eye exam. On statin. On ACE inhibitor. Has had Pneumovax in past. Will update today with Prevnar 13.  2. Diabetic  neuropathy Improved with increased dose of Neurontin. - COMPLETE METABOLIC PANEL WITH GFR  3. Hypercholesterolemia Decrease dose of statin for his Zocor 80 down to Zocor 40 at lab 9/14. Recheck now. - COMPLETE METABOLIC PANEL WITH GFR - Lipid panel  4. HTN (hypertension) Blood pressure at goal. On ACE inhibitor. - COMPLETE METABOLIC PANEL WITH GFR  5. screening PSA: Normal 07/2011. Would not repeat given advanced age of 32.  36. screening colonoscopy: At recent office visits we have discussed that this was due to. Today he says that he has decided  that he needs to come in to pick it off for now because he had to make a change of his insurance he is well aware of risks versus benefits.  7. Immunizations: Pneumonia vaccine: Received Pneumovax here 05/01/2008. Update with Prevnar 13 now. Tetanus: Given here 07/23/2011 Zostavax: He checked with his insurance-- cost would be $200 patient defers. Influenza Vaccine: Received here 09/07/13  Regular office visit 3 months. We'll follow up his A1c and decide what to do about his sugars.   Signed, [address] [place], [place], [facility] 12/07/2013 9:01 AM

## 2013-12-08 ENCOUNTER — Other Ambulatory Visit: Payer: Self-pay | Admitting: *Deleted

## 2013-12-08 MED ORDER — GLIPIZIDE ER 5 MG PO TB24: 5.0000 mg | ORAL_TABLET | Freq: Every day | ORAL | Status: DC

## 2013-12-08 MED ORDER — PIOGLITAZONE HCL 45 MG PO TABS: 45.0000 mg | ORAL_TABLET | Freq: Every day | ORAL | Status: DC

## 2013-12-15 ENCOUNTER — Other Ambulatory Visit: Payer: Self-pay | Admitting: Physician Assistant

## 2013-12-15 NOTE — Telephone Encounter (Signed)
Refill appropriate and filled per protocol. 

## 2014-01-09 ENCOUNTER — Other Ambulatory Visit: Payer: Self-pay | Admitting: Physician Assistant

## 2014-01-09 NOTE — Telephone Encounter (Signed)
Medication refilled per protocol. 

## 2014-02-01 ENCOUNTER — Other Ambulatory Visit: Payer: Self-pay | Admitting: Physician Assistant

## 2014-02-01 NOTE — Telephone Encounter (Signed)
Refill appropriate and filled per protocol. 

## 2014-02-06 ENCOUNTER — Other Ambulatory Visit: Payer: Self-pay | Admitting: Physician Assistant

## 2014-03-13 ENCOUNTER — Ambulatory Visit (INDEPENDENT_AMBULATORY_CARE_PROVIDER_SITE_OTHER): Payer: Medicare Other | Admitting: Physician Assistant

## 2014-03-13 ENCOUNTER — Encounter: Payer: Self-pay | Admitting: Family Medicine

## 2014-03-13 ENCOUNTER — Encounter: Payer: Self-pay | Admitting: Physician Assistant

## 2014-03-13 VITALS — BP 134/70 | HR 68 | Temp 98.2°F | Resp 18 | Wt 222.0 lb

## 2014-03-13 DIAGNOSIS — E7800 Pure hypercholesterolemia, unspecified: Secondary | ICD-10-CM

## 2014-03-13 DIAGNOSIS — E119 Type 2 diabetes mellitus without complications: Secondary | ICD-10-CM

## 2014-03-13 DIAGNOSIS — E1142 Type 2 diabetes mellitus with diabetic polyneuropathy: Secondary | ICD-10-CM

## 2014-03-13 DIAGNOSIS — M545 Low back pain, unspecified: Secondary | ICD-10-CM

## 2014-03-13 DIAGNOSIS — E1149 Type 2 diabetes mellitus with other diabetic neurological complication: Secondary | ICD-10-CM

## 2014-03-13 DIAGNOSIS — E1140 Type 2 diabetes mellitus with diabetic neuropathy, unspecified: Secondary | ICD-10-CM

## 2014-03-13 DIAGNOSIS — I10 Essential (primary) hypertension: Secondary | ICD-10-CM

## 2014-03-13 LAB — HEMOGLOBIN A1C, FINGERSTICK: HEMOGLOBIN A1C, FINGERSTICK: 6 % — AB (ref <5.7)

## 2014-03-13 LAB — MICROALBUMIN, URINE: MICROALB UR: 1.78 mg/dL (ref 0.00–1.89)

## 2014-03-13 MED ORDER — SITAGLIPTIN PHOSPHATE 100 MG PO TABS: 100.0000 mg | ORAL_TABLET | Freq: Every day | ORAL | Status: DC

## 2014-03-13 NOTE — Progress Notes (Signed)
Patient ID: Christian [name] MRN: [id number], DOB: 1935-05-10, 79 y.o. Date of Encounter: @DATE @  Chief Complaint:  Chief Complaint  Patient presents with  . 3 mth check up    had coffee with splenda/creamer    HPI: 79 y.o. year old white male  presents for routine followup visit.  Diabetes: At his office visit 09/07/13 he actually was reporting that sometimes around 5 or 6 PM he was feeling shaky and when checked his blood sugar --he would get it in the 60s. 09/07/13 he actually was reporting that sometimes around 5 or 6 PM he was feeling shaky and when checked his blood sugar --he would get it in the 60s. See that note for further details of that.  However at [place] 12/2013 he said that for the past 2 months all of a sudden his sugars had been reading much higher.  Said that he checks his blood sugar 3 times a day: Fasting morning, around 1:00 PM before eating lunch, and around 6 to 7 PM before eating dinner. Says that all 3 times a day he is getting high readings usually between 200-300. Says this morning fasting reading was 167. Says this is about as low as it has gotten in the last 2 months. Said that his meter and battery were very old and he was wondering whether his meter was accurate.  I told him we would check an A1c  and see whether it reflects the above high readings or not. If A1c is high as well then we will make adjustments in his medicines. If A1c is low then we will get him a new meter and new battery and see whether his blood sugar readings change with that or not.  12/2013--A1C had risen to 8.0. At that time I told him to increase Actos from 15-45 mg. Also recommended he add Glucotrol XL 5 mg every morning. Today he states that he did both of these medication changes. Says that his 30 day average blood sugar now is down to 122. As it may states he checks his blood sugar 2 times a day. As the there are some days are him he checks it once a day and some days he is able to check it 3 times a day. He always checks it fasting in the morning and consistently gets 120s to 130s. As his readings are back down to normal now. However,  he says that since making these medicine changes he has developed some swelling in both of his ankles. Visit he read that Actos can cause swelling. Says that prior to increasing the dose he was having no swelling and now he is having swelling every day.  Hypertension: Taking meds as directed. No adverse effects.  Hyperlipidemia: At lab 9/14 recommended decrease Zocor from  80 to 40. He did make that change.  Office visit 9/14 started Neurontin for diabetic neuropathy. At office visit 12/14 he reported that the medicine had helped a lot. However symptoms were not completely resolved. Therefore at that visit 12/14 be further titrated up the Neurontin. He says that he did increase that dose is and is up to 600 mg 3 times a day. Says that the discomfort in his feet is much improved and he claims that he was experiencing in his feet have resolved.  He does no activity except for yard work and working in his shop at times.  He reports that his "back has been giving him a lot of trouble." Says that he has pain across bilateral low back. Says this has been going on for about 6 months. Not having any radiation of pain or numbness or tingling down either  leg. Has never received medical evaluation of this back pain.   Past Medical History  Diagnosis Date  . Diabetes mellitus   . Hypercholesterolemia   . HTN (hypertension) 03/03/2013  . Diabetic neuropathy   . Skin cancer of arm 04/20/2013    left upper arm     Home Meds:  Outpatient Prescriptions Prior to Visit  Medication Sig Dispense Refill  . ACCU-CHEK AVIVA PLUS test strip USE ONE STRIP TO CHECK GLUCOSE TWICE DAILY  100 each  11  . aspirin EC 81 MG tablet Take 81 mg by mouth daily.        . cholecalciferol (VITAMIN D) 1000 UNITS tablet Take 1,000 Units by mouth daily.        . Cyanocobalamin (B-12 PO) Take 1 tablet by mouth daily.      . fish oil-omega-3 fatty acids 1000 MG capsule Take 3 g by mouth 2 (two) times daily.        Marland Kitchen gabapentin  (NEURONTIN) 600 MG tablet TAKE ONE TABLET BY MOUTH THREE TIMES DAILY  90 tablet  1  . glipiZIDE (GLUCOTROL XL) 5 MG 24 hr tablet Take 1 tablet (5 mg total) by mouth daily with breakfast.  30 tablet  3  . lisinopril (PRINIVIL,ZESTRIL) 10 MG tablet TAKE ONE TABLET BY MOUTH ONCE DAILY  30 tablet  3  . metFORMIN (GLUCOPHAGE) 1000 MG tablet TAKE ONE TABLET BY MOUTH TWICE DAILY WITH MEALS  60 tablet  1  . Multiple Vitamin (MULITIVITAMIN WITH MINERALS) TABS Take 1 tablet by mouth daily.        . niacin (NIASPAN) 500 MG CR tablet TAKE ONE TABLET BY MOUTH AT BEDTIME  30 tablet  5  . potassium gluconate 595 MG TABS tablet Take 595 mg by mouth daily.      . simvastatin (ZOCOR) 40 MG tablet TAKE ONE TABLET BY MOUTH ONCE DAILY AT BEDTIME  30 tablet  5  . pioglitazone (ACTOS) 45 MG tablet Take 1 tablet (45 mg total) by mouth daily.  30 tablet  3   No facility-administered medications prior to visit.    Allergies: No Known Allergies  History   Social History  . Marital Status: Single    Spouse Name: N/A    Number of Children: N/A  . Years of Education: N/A   Occupational History  . Not on file.   Social History Main Topics  . Smoking status: Former Smoker    Quit date: 03/04/2003  . Smokeless tobacco: Never Used  . Alcohol Use: No  . Drug Use: No  . Sexual Activity: Not on file   Other Topics Concern  . Not on file   Social History Narrative  . No narrative on file    No family history on file.   Review of Systems:  See HPI for pertinent ROS. All other ROS negative.    Physical Exam: Blood pressure 134/70, pulse 68, temperature 98.2 F (36.8 C), temperature source Oral, resp. rate 18, weight 222 lb (100.699 kg)., Body mass index is 35.3 kg/(m^2). General: WM. Abdominal Obesity--Moderate. Appears in no acute distress. Neck: Supple. No thyromegaly. No lymphadenopathy.No carotid bruit.  Lungs: Clear bilaterally to auscultation without wheezes, rales, or rhonchi. Breathing is  unlabored. Heart: RRR with S1 S2. No murmurs, rubs, or gallops. Abdomen: Soft, non-tender, non-distended with normoactive bowel sounds. No hepatomegaly. No rebound/guarding. No obvious abdominal masses. Musculoskeletal:  Strength and tone normal for age. Extremities/Skin: Warm and dry. 1+ - 2+ pitting edema at ankles  bilaterally. No edema higher than this level.  Neuro: Alert and oriented X 3. Moves all extremities spontaneously. Gait is normal. CNII-XII grossly in tact. Psych:  Responds to questions appropriately with a normal affect. Diabetic foot exam: Inspection: He has a lot of scaly skin. However no open wound or sores. Sensation is intact. Trace bilateral  dorsalis pedis pulses . No palpable posterior tibial pulses bilaterally.      ASSESSMENT AND PLAN:  79 y.o. year old male with   1. Diabetes - Hemoglobin A1C, fingerstick - Microalbumin, urine - sitaGLIPtin (JANUVIA) 100 MG tablet; Take 1 tablet (100 mg total) by mouth daily.  Dispense: 30 tablet; Refill: 3  Will stop Actos. Will start Januvia in place of Actos. Continiue other medications the same.  -MicroAlbumin--Last was 03/03/13--Repeat now.    He does have annual eye exam. On statin. On ACE inhibitor. Has had Pneumovax in past.  Updated 12/2013 with Prevnar 13.  2. Diabetic neuropathy Improved with increased dose of Neurontin.   3. Hypercholesterolemia Decreased dose of statin from Zocor 80 down to Zocor 40 at lab 9/14. Rechecked FLP/ LFT 12/2013.  4. HTN (hypertension) Blood pressure at goal. On ACE inhibitor. CMET good 12/2013   5. Low back pain - DG Lumbar Spine Complete; Future Will obtain x-ray. I will followup with patient once I obtain these results for further recommendations.   5. screening PSA: Normal 07/2011. Would not repeat given advanced age of 21.  70. screening colonoscopy: At recent office visits we have discussed that this was due.   7. Immunizations: Pneumonia vaccine: Received  Pneumovax here 05/01/2008. Updated with Prevnar 13 12/2013. Tetanus: Given here 07/23/2011 Zostavax: He checked with his insurance-- cost would be $200 patient defers. Influenza Vaccine: Received here 09/07/13  Regular office visit 3 months. Will follow up his A1c and decide what to do about his sugars.     [address] [place], [place], [facility] 03/13/2014 5:19 PM

## 2014-03-14 ENCOUNTER — Ambulatory Visit
Admission: RE | Admit: 2014-03-14 | Discharge: 2014-03-14 | Disposition: A | Discharge status: Home / Self Care | Payer: Medicare Other | Source: Ambulatory Visit | Attending: Physician Assistant | Admitting: Physician Assistant

## 2014-03-14 DIAGNOSIS — M545 Low back pain, unspecified: Secondary | ICD-10-CM

## 2014-03-15 ENCOUNTER — Telehealth: Payer: Self-pay | Admitting: Family Medicine

## 2014-03-15 DIAGNOSIS — M545 Low back pain, unspecified: Secondary | ICD-10-CM

## 2014-03-15 DIAGNOSIS — R937 Abnormal findings on diagnostic imaging of other parts of musculoskeletal system: Secondary | ICD-10-CM

## 2014-03-15 NOTE — Telephone Encounter (Signed)
Pt made aware of lab and xray results.  States no problem with cost of Januvia.  Ortho referral initiated for back pain.

## 2014-03-15 NOTE — Telephone Encounter (Signed)
Message copied by Olena Mater on Wed Mar 15, 2014 12:42 PM ------      Message from: Dena Billet      Created: Mon Mar 13, 2014  5:17 PM       Labs are good.      A1 C. is back down to 6.0 which is excellent. (Last check 3/15 it had gone up to 8.0)      Tell him to take the medications as directed/ as planned at the office visit today. Let me know if the cost of Januvia is going to be high.       ------

## 2014-03-15 NOTE — Telephone Encounter (Signed)
Message copied by Olena Mater on Wed Mar 15, 2014 12:43 PM ------      Message from: Dena Billet      Created: Tue Mar 14, 2014  5:21 PM       Tell him that the x-ray shows advanced disc degeneration.      I recommend that he followup with a specialist.Find out if he has any preference of who he sees. ------

## 2014-03-17 ENCOUNTER — Other Ambulatory Visit: Payer: Self-pay | Admitting: Physician Assistant

## 2014-03-17 NOTE — Telephone Encounter (Signed)
Medication refilled per protocol. 

## 2014-04-06 ENCOUNTER — Other Ambulatory Visit: Payer: Self-pay | Admitting: Family Medicine

## 2014-04-06 ENCOUNTER — Other Ambulatory Visit: Payer: Self-pay | Admitting: Physician Assistant

## 2014-04-06 NOTE — Telephone Encounter (Signed)
Medication refilled per protocol. 

## 2014-04-25 ENCOUNTER — Other Ambulatory Visit: Payer: Self-pay | Admitting: Physician Assistant

## 2014-04-25 NOTE — Telephone Encounter (Signed)
Prescription sent to pharmacy.

## 2014-05-17 DIAGNOSIS — G9519 Other vascular myelopathies: Secondary | ICD-10-CM

## 2014-05-17 DIAGNOSIS — R29818 Other symptoms and signs involving the nervous system: Secondary | ICD-10-CM

## 2014-05-17 DIAGNOSIS — M4800 Spinal stenosis, site unspecified: Secondary | ICD-10-CM

## 2014-05-17 HISTORY — DX: Other vascular myelopathies: G95.19

## 2014-05-17 HISTORY — DX: Spinal stenosis, site unspecified: M48.00

## 2014-05-17 HISTORY — DX: Other symptoms and signs involving the nervous system: R29.818

## 2014-06-03 ENCOUNTER — Encounter: Payer: Self-pay | Admitting: *Deleted

## 2014-06-14 ENCOUNTER — Ambulatory Visit (INDEPENDENT_AMBULATORY_CARE_PROVIDER_SITE_OTHER): Payer: Medicare Other | Admitting: Physician Assistant

## 2014-06-14 ENCOUNTER — Encounter: Payer: Self-pay | Admitting: Physician Assistant

## 2014-06-14 VITALS — BP 124/52 | HR 74 | Temp 97.9°F | Resp 18 | Ht 68.5 in | Wt 217.0 lb

## 2014-06-14 DIAGNOSIS — E7800 Pure hypercholesterolemia, unspecified: Secondary | ICD-10-CM

## 2014-06-14 DIAGNOSIS — M545 Low back pain, unspecified: Secondary | ICD-10-CM

## 2014-06-14 DIAGNOSIS — R600 Localized edema: Secondary | ICD-10-CM

## 2014-06-14 DIAGNOSIS — E1149 Type 2 diabetes mellitus with other diabetic neurological complication: Secondary | ICD-10-CM

## 2014-06-14 DIAGNOSIS — R609 Edema, unspecified: Secondary | ICD-10-CM

## 2014-06-14 DIAGNOSIS — Z23 Encounter for immunization: Secondary | ICD-10-CM

## 2014-06-14 DIAGNOSIS — E119 Type 2 diabetes mellitus without complications: Secondary | ICD-10-CM

## 2014-06-14 DIAGNOSIS — E1165 Type 2 diabetes mellitus with hyperglycemia: Secondary | ICD-10-CM

## 2014-06-14 DIAGNOSIS — I10 Essential (primary) hypertension: Secondary | ICD-10-CM

## 2014-06-14 LAB — COMPLETE METABOLIC PANEL WITH GFR
ALBUMIN: 4.2 g/dL (ref 3.5–5.2)
ALK PHOS: 47 U/L (ref 39–117)
ALT: 26 U/L (ref 0–53)
AST: 20 U/L (ref 0–37)
BUN: 13 mg/dL (ref 6–23)
CALCIUM: 9.4 mg/dL (ref 8.4–10.5)
CHLORIDE: 107 meq/L (ref 96–112)
CO2: 25 meq/L (ref 19–32)
Creat: 0.99 mg/dL (ref 0.50–1.35)
GFR, EST AFRICAN AMERICAN: 85 mL/min
GFR, EST NON AFRICAN AMERICAN: 73 mL/min
GLUCOSE: 168 mg/dL — AB (ref 70–99)
POTASSIUM: 4.3 meq/L (ref 3.5–5.3)
Sodium: 140 mEq/L (ref 135–145)
TOTAL PROTEIN: 6.5 g/dL (ref 6.0–8.3)
Total Bilirubin: 0.4 mg/dL (ref 0.2–1.2)

## 2014-06-14 LAB — LIPID PANEL
CHOLESTEROL: 131 mg/dL (ref 0–200)
HDL: 38 mg/dL — ABNORMAL LOW (ref >39)
LDL Cholesterol: 60 mg/dL (ref 0–99)
Total CHOL/HDL Ratio: 3.4 Ratio
Triglycerides: 163 mg/dL — ABNORMAL HIGH (ref <150)
VLDL: 33 mg/dL (ref 0–40)

## 2014-06-14 MED ORDER — HYDROCHLOROTHIAZIDE 12.5 MG PO CAPS: 12.5000 mg | ORAL_CAPSULE | Freq: Every day | ORAL | Status: DC

## 2014-06-14 NOTE — Progress Notes (Signed)
Patient ID: Christian [name] MRN: [id number], DOB: 1935/09/15, 79 y.o. Date of Encounter: @DATE @  Chief Complaint:  Chief Complaint  Patient presents with  . Follow-up    3 month    HPI: 79 y.o. year old white male  presents for routine followup visit.  Diabetes:  At [place] 12/2013 he said that for the past 2 months all of a sudden his sugars had been reading much higher.  12/2013--A1C had risen to 8.0. At that time I told him to increase Actos from 15mg  to 45 mg. Also recommended he add Glucotrol XL 5 mg every morning.  At f/u OV 03/2014 he stated that he did both of these medication changes. Says that his 30 day average blood sugar now is down to 122. On most days he checks his blood sugar 2 times a day. Said there are some days are him he checks it once a day and some days he is able to check it 3 times a day. He always checks it fasting in the morning and consistently gets 120s to 130s. As his readings are back down to normal now. However, at that 03/2014 OV he said that since making these medicine changes he had developed some swelling in both of his ankles. Said he read that Actos can cause swelling. Said that prior to increasing the dose he was having no swelling and now he is having swelling every day.  At the 03/2014 OV I told him to stop Actos. Started Januvia in place of Actos. Continued all other medications the same.  Today he reports that he did stop the activities and did start [place]. Says that he continues to take the Januvia daily as directed.  However, he says he does still have some swelling in his ankles. He says that he sleeps on 2 pillows at night but has done this for long time for comfort. Says that he is has no orthopnea. He has noticed no increase in shortness of breath or dyspnea on exertion and no PND.   Hypertension: Taking meds as directed. No adverse effects.  Hyperlipidemia: On Zocor 40mg  and on Niaspan. No myalgias or other adverse medication  effects.  Office visit 9/14 started Neurontin for diabetic neuropathy. At office visit 12/14 he reported that the medicine had helped a lot. However symptoms were not completely resolved. Therefore at that visit 12/14 be further titrated up the Neurontin. He says that he did increase that dose is and is up to 600 mg 3 times a day. Says that the discomfort in his feet is much improved and he claims that he was experiencing in his feet have resolved.  He does no activity except for yard work and working in his shop at times.  At [place] 03/2014 he reported that his "back has been giving him a lot of trouble." Michela Pitcher that he has pain across bilateral low back. Michela Pitcher this has been going on for about 6 months. Not having any radiation of pain or numbness or tingling down either leg. Has never received medical evaluation of this back pain. At office visit 03/2014 obtained x-ray of lumbar spine. X-ray of lumbar spine showed:  "Moderate to severe disc space loss at L2-L3 and L3-L4-----" and "wide spread in plate changes----" I then ordered a referral to a spine specialist. Today patient states that he is seeing Dr. Nelva Bush and Dr. Nelva Bush is doing injections. He says is so far he has had injections once and he is scheduled to have  his second round of injections next Wednesday. Says after that first injection he only felt relief for a couple of days.   Past Medical History  Diagnosis Date  . Diabetes mellitus   . Hypercholesterolemia   . HTN (hypertension) 03/03/2013  . Diabetic neuropathy   . Skin cancer of arm 04/20/2013    left upper arm  . Spinal stenosis 05/17/2014    L3-L4   . Neurogenic claudication 05/17/2014     Home Meds:  Outpatient Prescriptions Prior to Visit  Medication Sig Dispense Refill  . ACCU-CHEK AVIVA PLUS test strip USE ONE STRIP TO CHECK GLUCOSE TWICE DAILY  100 each  11  . aspirin EC 81 MG tablet Take 81 mg by mouth daily.        . cholecalciferol (VITAMIN D) 1000 UNITS tablet Take  1,000 Units by mouth daily.        . Cyanocobalamin (B-12 PO) Take 1 tablet by mouth daily.      . fish oil-omega-3 fatty acids 1000 MG capsule Take 3 g by mouth 2 (two) times daily.        Marland Kitchen gabapentin (NEURONTIN) 600 MG tablet TAKE ONE TABLET BY MOUTH THREE TIMES DAILY  90 tablet  3  . glipiZIDE (GLUCOTROL XL) 5 MG 24 hr tablet TAKE ONE TABLET BY MOUTH ONCE DAILY WITH  BREAKFAST  30 tablet  3  . lisinopril (PRINIVIL,ZESTRIL) 10 MG tablet TAKE ONE TABLET BY MOUTH ONCE DAILY  30 tablet  3  . metFORMIN (GLUCOPHAGE) 1000 MG tablet TAKE ONE TABLET BY MOUTH TWICE DAILY WITH MEALS  60 tablet  3  . Multiple Vitamin (MULITIVITAMIN WITH MINERALS) TABS Take 1 tablet by mouth daily.        . niacin (NIASPAN) 500 MG CR tablet TAKE ONE TABLET BY MOUTH AT BEDTIME  30 tablet  6  . potassium gluconate 595 MG TABS tablet Take 595 mg by mouth daily.      . simvastatin (ZOCOR) 40 MG tablet TAKE ONE TABLET BY MOUTH ONCE DAILY AT BEDTIME  30 tablet  5  . sitaGLIPtin (JANUVIA) 100 MG tablet Take 1 tablet (100 mg total) by mouth daily.  30 tablet  3   No facility-administered medications prior to visit.    Allergies: No Known Allergies  History   Social History  . Marital Status: Single    Spouse Name: N/A    Number of Children: N/A  . Years of Education: N/A   Occupational History  . Not on file.   Social History Main Topics  . Smoking status: Former Smoker    Quit date: 03/04/2003  . Smokeless tobacco: Never Used  . Alcohol Use: No  . Drug Use: No  . Sexual Activity: Not on file   Other Topics Concern  . Not on file   Social History Narrative  . No narrative on file    No family history on file.   Review of Systems:  See HPI for pertinent ROS. All other ROS negative.    Physical Exam: Blood pressure 124/52, pulse 74, temperature 97.9 F (36.6 C), temperature source Oral, resp. rate 18, height 5' 8.5" (1.74 m), weight 217 lb (98.431 kg)., Body mass index is 32.51 kg/(m^2). General: WM.  Abdominal Obesity--Moderate. Appears in no acute distress. Neck: Supple. No thyromegaly. No lymphadenopathy.No carotid bruit.  Lungs: Clear bilaterally to auscultation without wheezes, rales, or rhonchi. Breathing is unlabored. Heart: RRR with S1 S2. No murmurs, rubs, or gallops. Abdomen: Soft, non-tender, non-distended with  normoactive bowel sounds. No hepatomegaly. No rebound/guarding. No obvious abdominal masses. Musculoskeletal:  Strength and tone normal for age. Extremities/Skin: Warm and dry. 1+ - 2+ pitting edema at ankles bilaterally. No edema higher than this level.  Neuro: Alert and oriented X 3. Moves all extremities spontaneously. Gait is normal. CNII-XII grossly in tact. Psych:  Responds to questions appropriately with a normal affect. Diabetic foot exam: Inspection: He has a lot of scaly skin. However no open wound or sores. Sensation is intact. Trace bilateral  dorsalis pedis pulses . No palpable posterior tibial pulses bilaterally.      ASSESSMENT AND PLAN:  79 y.o. year old male with   1. Diabetes - Hemoglobin A1C - CMET -FLP  Microalbumin was performed 03/13/2014   He does have annual eye exam.  On ASA 81mg  QD On statin. On ACE inhibitor.  Has had Pneumovax 23 in past. Had Prevnar 13-- 12/2013.  2. Diabetic neuropathy Improved with increased dose of Neurontin. Some of this neuropathy is probably actually secondary to his lumbar spine abnormalities that were just diagnosed 03/2014. Also diabetic neuropathy may be contributing to symptoms.   3. Hypercholesterolemia On Zocor and Niaspan. Rechecked FLP/ LFT 12/2013.   4. Essential hypertension Blood Pressure at goal. On ACE inhibitor. Need to add low-dose diuretic but we'll have to monitor for lightheadedness/presyncope as his blood pressure is on the low side. Told him to call me if he develops lightheadedness/presyncope.  The only medicine he is on effecting blood pressure is  ACE inhibitor which he needs for  renal protection with his diabetes. However he is now needing some low-dose diuretics because of his lower extremity edema. - COMPLETE METABOLIC PANEL WITH GFR - hydrochlorothiazide (MICROZIDE) 12.5 MG capsule; Take 1 capsule (12.5 mg total) by mouth daily.  Dispense: 30 capsule; Refill: 5  5. Bilateral lower extremity edema - hydrochlorothiazide (MICROZIDE) 12.5 MG capsule; Take 1 capsule (12.5 mg total) by mouth daily.  Dispense: 30 capsule; Refill: 5 He is to call me if he develops lightheadedness/presyncope. He had normal renal function and normal potassium at last CMET -CMET   6. Low back pain Being managed by Dr. Nelva Bush.   7. screening PSA: Normal 07/2011. Would not repeat given advanced age of 72.  23. screening colonoscopy: At recent office visits we have discussed that this was due.  At visit 06/2014 he says that he needs to get his back pain control prior to scheduling this.  7. Immunizations: Pneumonia vaccine: Received Pneumovax here 05/01/2008. Updated with Prevnar 13 12/2013. Tetanus: Given here 07/23/2011 Zostavax: He checked with his insurance-- cost would be $200 patient defers. Influenza Vaccine: Received here 09/07/13. At office visit 06/14/2014 he was agreeable to go ahead and get his flu shot for this season.  Regular office visit 3 months. Sooner if needed. I GAVE HIM JANUVIA SAMPLES TODAY--21 PILLS--THIS IS THE ONLY MED WE GET SAMPLES OF THAT HE IS ON--WILL GIVE HIM SAMPLES AT [facility] OV as he is Medicare/try to avoid [employer].     [address] [place], [place], [facility] 06/14/2014 9:33 AM

## 2014-06-15 LAB — HEMOGLOBIN A1C
Hgb A1c MFr Bld: 7.3 % — ABNORMAL HIGH (ref <5.7)
Mean Plasma Glucose: 163 mg/dL — ABNORMAL HIGH (ref <117)

## 2014-06-22 ENCOUNTER — Other Ambulatory Visit: Payer: Self-pay | Admitting: Physician Assistant

## 2014-06-22 MED ORDER — GLIPIZIDE 10 MG PO TABS: 10.0000 mg | ORAL_TABLET | Freq: Two times a day (BID) | ORAL | Status: DC

## 2014-07-14 ENCOUNTER — Encounter: Payer: Self-pay | Admitting: Family Medicine

## 2014-07-14 DIAGNOSIS — M5106 Intervertebral disc disorders with myelopathy, lumbar region: Secondary | ICD-10-CM | POA: Insufficient documentation

## 2014-07-25 ENCOUNTER — Other Ambulatory Visit: Payer: Self-pay | Admitting: Physician Assistant

## 2014-07-25 NOTE — Telephone Encounter (Signed)
Medication refilled per protocol. 

## 2014-08-21 ENCOUNTER — Other Ambulatory Visit: Payer: Self-pay | Admitting: Physician Assistant

## 2014-08-21 NOTE — Telephone Encounter (Signed)
Medication refilled per protocol. 

## 2014-08-23 ENCOUNTER — Other Ambulatory Visit: Payer: Self-pay | Admitting: Physician Assistant

## 2014-08-24 NOTE — Telephone Encounter (Signed)
Medication refilled per protocol. 

## 2014-09-05 ENCOUNTER — Other Ambulatory Visit: Payer: Self-pay | Admitting: Physician Assistant

## 2014-09-06 NOTE — Telephone Encounter (Signed)
Medication refilled per protocol. 

## 2014-09-13 ENCOUNTER — Encounter: Payer: Self-pay | Admitting: Physician Assistant

## 2014-09-13 ENCOUNTER — Ambulatory Visit (INDEPENDENT_AMBULATORY_CARE_PROVIDER_SITE_OTHER): Payer: Medicare Other | Admitting: Physician Assistant

## 2014-09-13 VITALS — BP 142/70 | HR 76 | Temp 98.2°F | Resp 18 | Wt 218.0 lb

## 2014-09-13 DIAGNOSIS — I10 Essential (primary) hypertension: Secondary | ICD-10-CM

## 2014-09-13 DIAGNOSIS — E7800 Pure hypercholesterolemia, unspecified: Secondary | ICD-10-CM

## 2014-09-13 DIAGNOSIS — E1165 Type 2 diabetes mellitus with hyperglycemia: Secondary | ICD-10-CM

## 2014-09-13 DIAGNOSIS — E1142 Type 2 diabetes mellitus with diabetic polyneuropathy: Secondary | ICD-10-CM

## 2014-09-13 DIAGNOSIS — M5106 Intervertebral disc disorders with myelopathy, lumbar region: Secondary | ICD-10-CM

## 2014-09-13 DIAGNOSIS — R600 Localized edema: Secondary | ICD-10-CM

## 2014-09-13 LAB — HEMOGLOBIN A1C, FINGERSTICK: HEMOGLOBIN A1C, FINGERSTICK: 6.9 % — AB (ref <5.7)

## 2014-09-13 NOTE — Progress Notes (Signed)
Patient ID: Christian [name] MRN: [id number], DOB: [birth year], 79 y.o. Date of Encounter: @DATE @  Chief Complaint:  Chief Complaint  Patient presents with  . routine check up    is fasting    HPI: 79 y.o. year old white male  presents for routine followup visit.  Diabetes:  At [place] 12/2013 he said that for the past 2 months all of a sudden his sugars had been reading much higher.  12/2013--A1C had risen to 8.0. At that time I told him to increase Actos from 15mg  to 45 mg. Also recommended he add Glucotrol XL 5 mg every morning.  At f/u OV 03/2014 he stated that he did both of these medication changes. Says that his 30 day average blood sugar now is down to 122. On most days he checks his blood sugar 2 times a day. Said there are some days are him he checks it once a day and some days he is able to check it 3 times a day. He always checks it fasting in the morning and consistently gets 120s to 130s. As his readings are back down to normal now. However, at that 03/2014 OV he said that since making these medicine changes he had developed some swelling in both of his ankles. Said he read that Actos can cause swelling. Said that prior to increasing the dose he was having no swelling and now he is having swelling every day.  At the 03/2014 OV I told him to stop Actos. Started Januvia in place of Actos. Continued all other medications the same.  At f/u OV he reports that he did stop the Actos and did start Januvia. Says that he continues to take the Januvia daily as directed.  At 09/2014 OV--- says BS are running around 200.  Fastings are around 200. Also checks at night. Gets 150 - 200.  Says that he is taking all of his medications as directed and assure that he has not accidentally run out of any of them.  At 09/2014 OV--says no longer having swelling in feet--no longer taking mrd for this.  He says that he sleeps on 2 pillows at night but has done this for long time for comfort. Says that he  is has no orthopnea. He has noticed no increase in shortness of breath or dyspnea on exertion and no PND.   Hypertension: Taking meds as directed. No adverse effects.  Hyperlipidemia: On Zocor 40mg  and on Niaspan. No myalgias or other adverse medication effects.  Office visit 9/14 started Neurontin for diabetic neuropathy. At office visit 12/14 he reported that the medicine had helped a lot. However symptoms were not completely resolved. Therefore at that visit 12/14 be further titrated up the Neurontin. He says that he did increase that dose is and is up to 600 mg 3 times a day. Says that the discomfort in his feet is much improved and he claims that he was experiencing in his feet have resolved. At office visit 09/2014 he says that the cost of the Neurontin has increased from $30-$50.  He also says that he feels that this medicine is not really helping. He is also seeing Dr. Nelva Bush regarding his back. Says that he has had 2 injections by Dr. Nelva Bush and that they are not helping either. Says that he has another appointment with Dr. Nelva Bush December  28th. I have told him to discuss all of these symptoms with Dr. Nelva Bush and to continue current dose of Neurontin for now  until we get input from Dr. Nelva Bush.    At [place] 03/2014 he reported that his "back has been giving him a lot of trouble." Michela Pitcher that he has pain across bilateral low back. Michela Pitcher this has been going on for about 6 months. Not having any radiation of pain or numbness or tingling down either leg. Has never received medical evaluation of this back pain. At office visit 03/2014 obtained x-ray of lumbar spine. X-ray of lumbar spine showed:  "Moderate to severe disc space loss at L2-L3 and L3-L4-----" and "wide spread end plate changes----" I then ordered a referral to a spine specialist. Today patient states that he is seeing Dr. Nelva Bush and Dr. Nelva Bush is doing injections. He says is so far he has had injections once and he is scheduled to have his  second round of injections next Wednesday. Says after that first injection he only felt relief for a couple of days.   Past Medical History  Diagnosis Date  . Diabetes mellitus   . Hypercholesterolemia   . HTN (hypertension) 03/03/2013  . Diabetic neuropathy   . Skin cancer of arm 04/20/2013    left upper arm  . Spinal stenosis 05/17/2014    L3-L4   . Neurogenic claudication 05/17/2014     Home Meds:  Outpatient Prescriptions Prior to Visit  Medication Sig Dispense Refill  . ACCU-CHEK AVIVA PLUS test strip USE ONE STRIP TO CHECK GLUCOSE TWICE DAILY 100 each 11  . aspirin EC 81 MG tablet Take 81 mg by mouth daily.      . cholecalciferol (VITAMIN D) 1000 UNITS tablet Take 1,000 Units by mouth daily.      . Cyanocobalamin (B-12 PO) Take 1 tablet by mouth daily.    . fish oil-omega-3 fatty acids 1000 MG capsule Take 3 g by mouth 2 (two) times daily.      Marland Kitchen gabapentin (NEURONTIN) 600 MG tablet TAKE ONE TABLET BY MOUTH THREE TIMES DAILY 90 tablet 0  . glipiZIDE (GLUCOTROL) 10 MG tablet Take 1 tablet (10 mg total) by mouth 2 (two) times daily before a meal. 60 tablet 3  . hydrochlorothiazide (MICROZIDE) 12.5 MG capsule Take 1 capsule (12.5 mg total) by mouth daily. 30 capsule 5  . JANUVIA 100 MG tablet TAKE ONE TABLET BY MOUTH ONCE DAILY 30 tablet 0  . lisinopril (PRINIVIL,ZESTRIL) 10 MG tablet TAKE ONE TABLET BY MOUTH ONCE DAILY 30 tablet 0  . metFORMIN (GLUCOPHAGE) 1000 MG tablet TAKE ONE TABLET BY MOUTH TWICE DAILY WITH MEALS 60 tablet 0  . Multiple Vitamin (MULITIVITAMIN WITH MINERALS) TABS Take 1 tablet by mouth daily.      . niacin (NIASPAN) 500 MG CR tablet TAKE ONE TABLET BY MOUTH AT BEDTIME 30 tablet 6  . potassium gluconate 595 MG TABS tablet Take 595 mg by mouth daily.    . simvastatin (ZOCOR) 40 MG tablet TAKE ONE TABLET BY MOUTH ONCE DAILY AT BEDTIME 30 tablet 3   No facility-administered medications prior to visit.    Allergies: No Known Allergies  History   Social  History  . Marital Status: Single    Spouse Name: N/A    Number of Children: N/A  . Years of Education: N/A   Occupational History  . Not on file.   Social History Main Topics  . Smoking status: Former Smoker    Quit date: 03/04/2003  . Smokeless tobacco: Never Used  . Alcohol Use: No  . Drug Use: No  . Sexual Activity: Not on  file   Other Topics Concern  . Not on file   Social History Narrative    History reviewed. No pertinent family history.   Review of Systems:  See HPI for pertinent ROS. All other ROS negative.    Physical Exam: Blood pressure 142/70, pulse 76, temperature 98.2 F (36.8 C), temperature source Oral, resp. rate 18, weight 218 lb (98.884 kg)., Body mass index is 32.66 kg/(m^2). General: WM. Abdominal Obesity--Moderate. Appears in no acute distress. Neck: Supple. No thyromegaly. No lymphadenopathy.No carotid bruit.  Lungs: Clear bilaterally to auscultation without wheezes, rales, or rhonchi. Breathing is unlabored. Heart: RRR with S1 S2. No murmurs, rubs, or gallops. Abdomen: Soft, non-tender, non-distended with normoactive bowel sounds. No hepatomegaly. No rebound/guarding. No obvious abdominal masses. Musculoskeletal:  Strength and tone normal for age. Extremities/Skin: Warm and dry. 1+ - 2+ pitting edema at ankles bilaterally. No edema higher than this level.  Neuro: Alert and oriented X 3. Moves all extremities spontaneously. Gait is normal. CNII-XII grossly in tact. Psych:  Responds to questions appropriately with a normal affect. Diabetic foot exam: Inspection: He has a lot of scaly skin. However no open wound or sores. Sensation is intact. Trace right  dorsalis pedis pulse . No palpable Left DP pulse.  No palpable posterior tibial pulses bilaterally.      ASSESSMENT AND PLAN:  79 y.o. year old male with   1. Diabetes - Hemoglobin A1C   Microalbumin was performed 03/13/2014 Diabetic foot exam documented and quality metrics on  09/13/2014.   He does have annual eye exam.  On ASA 81mg  QD On statin. On ACE inhibitor.  Has had Pneumovax 23 in past. Had Prevnar 13-- 12/2013.  2. Diabetic neuropathy In past--Improved with increased dose of Neurontin.- Some of this neuropathy is probably actually secondary to his lumbar spine abnormalities that were just diagnosed 03/2014. Also diabetic neuropathy may be contributing to symptoms. The history of present illness on 09/2014 office note--- wait to get input from Dr.Ramos  3. Hypercholesterolemia On Zocor and Niaspan. Lipid panel excellent at last check 06/14/14.   4. Essential hypertension Blood Pressure at goal. On ACE inhibitor.  5. H/O  Bilateral lower extremity edema - hydrochlorothiazide (MICROZIDE) 12.5 MG capsule; Take 1 capsule (12.5 mg total) by mouth daily.  Dispense: 30 capsule; Refill: 5--- says he is no longer having lower extremity edema and is just using this when necessary.   6. Low back pain Being managed by Dr. Nelva Bush.   7. screening PSA: Normal 07/2011. Will not repeat given advanced age of 26.  52. screening colonoscopy: At recent office visits we have discussed that this was due.  At visit 06/2014 he says that he needs to get his back pain control prior to scheduling this.  7. Immunizations: Pneumonia vaccine: Received Pneumovax here 05/01/2008. Updated with Prevnar 13 12/2013. Tetanus: Given here 07/23/2011 Zostavax: He checked with his insurance-- cost would be $200 patient defers. Influenza Vaccine: Received here 09/07/13. At office visit 06/14/2014 he was agreeable to go ahead and get his flu shot for this season.  Regular office visit 3 months. Sooner if needed. I GAVE HIM JANUVIA SAMPLES TODAY--21 PILLS--THIS IS THE ONLY MED WE GET SAMPLES OF THAT HE IS ON--WILL GIVE HIM SAMPLES AT [facility] OV as he is Medicare/try to avoid [employer].     Signed, [address] [place], [place], [facility] 09/13/2014 9:01 AM

## 2014-09-18 ENCOUNTER — Other Ambulatory Visit: Payer: Self-pay | Admitting: Physician Assistant

## 2014-09-19 NOTE — Telephone Encounter (Signed)
Medication refilled per protocol. 

## 2014-09-25 ENCOUNTER — Other Ambulatory Visit: Payer: Self-pay | Admitting: Physician Assistant

## 2014-09-25 NOTE — Telephone Encounter (Signed)
Medication refilled per protocol. 

## 2014-10-09 ENCOUNTER — Other Ambulatory Visit: Payer: Self-pay | Admitting: Physician Assistant

## 2014-10-09 NOTE — Telephone Encounter (Signed)
Medication refilled per protocol. 

## 2014-10-23 ENCOUNTER — Other Ambulatory Visit: Payer: Self-pay | Admitting: Physician Assistant

## 2014-10-24 NOTE — Telephone Encounter (Signed)
Medication refilled per protocol. 

## 2014-10-25 ENCOUNTER — Other Ambulatory Visit: Payer: Self-pay | Admitting: Physician Assistant

## 2014-10-25 NOTE — Telephone Encounter (Signed)
Refill appropriate and filled per protocol. 

## 2014-10-31 ENCOUNTER — Other Ambulatory Visit: Payer: Self-pay | Admitting: Neurological Surgery

## 2014-11-01 ENCOUNTER — Other Ambulatory Visit: Payer: Self-pay | Admitting: Physician Assistant

## 2014-11-01 NOTE — Telephone Encounter (Signed)
Medication refilled per protocol. 

## 2014-11-02 ENCOUNTER — Other Ambulatory Visit: Payer: Self-pay | Admitting: Physician Assistant

## 2014-11-02 DIAGNOSIS — IMO0002 Reserved for concepts with insufficient information to code with codable children: Secondary | ICD-10-CM

## 2014-11-02 DIAGNOSIS — E1165 Type 2 diabetes mellitus with hyperglycemia: Secondary | ICD-10-CM

## 2014-11-02 NOTE — Telephone Encounter (Signed)
Medication refilled per protocol. 

## 2014-11-14 ENCOUNTER — Ambulatory Visit (HOSPITAL_COMMUNITY)
Admission: RE | Admit: 2014-11-14 | Discharge: 2014-11-14 | Disposition: A | Discharge status: Home / Self Care | Payer: PPO | Source: Ambulatory Visit | Attending: Neurological Surgery | Admitting: Neurological Surgery

## 2014-11-14 ENCOUNTER — Encounter (HOSPITAL_COMMUNITY): Payer: Self-pay

## 2014-11-14 ENCOUNTER — Encounter (HOSPITAL_COMMUNITY)
Admission: RE | Admit: 2014-11-14 | Discharge: 2014-11-14 | Disposition: A | Discharge status: Home / Self Care | Payer: PPO | Source: Ambulatory Visit | Attending: Neurological Surgery | Admitting: Neurological Surgery

## 2014-11-14 DIAGNOSIS — M4806 Spinal stenosis, lumbar region: Secondary | ICD-10-CM | POA: Insufficient documentation

## 2014-11-14 DIAGNOSIS — I10 Essential (primary) hypertension: Secondary | ICD-10-CM | POA: Diagnosis not present

## 2014-11-14 DIAGNOSIS — M48061 Spinal stenosis, lumbar region without neurogenic claudication: Secondary | ICD-10-CM

## 2014-11-14 HISTORY — DX: Unspecified osteoarthritis, unspecified site: M19.90

## 2014-11-14 HISTORY — DX: Urgency of urination: R39.15

## 2014-11-14 HISTORY — DX: Personal history of urinary calculi: Z87.442

## 2014-11-14 NOTE — Progress Notes (Signed)
Nurse noticed that patient had a chest xray at 1328. Patient did not have any labs or EKG done before leaving PAT. Lorriane Shire at Dr. Ronnald Ramp office informed of this.

## 2014-11-14 NOTE — Progress Notes (Signed)
   11/14/14 [place]  Have you ever been diagnosed with sleep apnea through a sleep study? No  Do you snore loudly (loud enough to be heard through closed doors)?  0  Do you often feel tired, fatigued, or sleepy during the daytime? 1  Has anyone observed you stop breathing during your sleep? 0  Do you have, or are you being treated for high blood pressure? 1  BMI more than 35 kg/m2? 0  Age over 78 years old? 1  Neck circumference greater than 40 cm/16 inches? 1  Gender: 1  Obstructive Sleep Apnea Score 5  Score 4 or greater  Results sent to PCP   This patient has screened at risk for sleep apnea using the STOP Bang tool used during a pre-surgical visit. A score of 4 or greater is at risk for sleep apnea.

## 2014-11-14 NOTE — Progress Notes (Signed)
PCP is [employer]. Patient denied having any acute cardiac or pulmonary issues. Patient stated he had a stress test in [place], but patient was unaware as to exactly when or where it occurred. Patient denied having a cardiac cath or sleep study. Sleep apnea results sent to PCP.  Nurse informed patient that he needed to have a chest xray done. Patient stated he had one done he thought within the last year at the building over near [place]. Nurse called Lorriane Shire at Dr. Ronnald Ramp office to inquire about xray results. Lorriane Shire stated she did not have any results, but informed Nurse that patient see Dr. Nelva Bush at [facility]. Nurse then called Dr. Herma Mering office and inquired about xray results, they too denied having any results. Finally Nurse called [facility] and they too did not have any xray results on file for patient. Will inform patient of this.

## 2014-11-14 NOTE — Progress Notes (Signed)
Gerilyn Nestle (Quarry manager) approched Nurse inquiring about patients whereabouts. Nurse last saw patient seating in chair awaiting to have lab work, EKG, and Xray done. Patient is no where to be found. Another patient was also sitting in PAT and stated "he was sitting right beside me talking to that other man...Marland KitchenMarland KitchenI never saw him leave...Marland KitchenMarland KitchenI don't know where he went." Nurse called patient x 4 times on number listed, however phone is going straight to voicemail.

## 2014-11-14 NOTE — Pre-Procedure Instructions (Signed)
Christian [name]  11/14/2014   Your procedure is scheduled on:  Wednesday November 22, 2014 at 12:50 PM.  Report to [facility] Admitting at 10:50 AM.  Call this number if you have problems the morning of surgery: [phone]   For any other questions Monday-Friday from 8am-4pm call: [phone]   Remember:   Do not eat food or drink liquids after midnight.   Take these medicines the morning of surgery with A SIP OF WATER: Acetaminophen (Tylenol) if needed, Gabapentin (Neurontin), Hydrocodone if needed   Do NOT take any diabetic medications the morning of your surgery    Please stop taking any vitamins, fish oil, Advil, Motrin, Ibuprofen, Alleve, etc on Wednesday February 10th   Do not wear jewelry.  Do not wear lotions, powders, or cologne.   Men may shave face and neck.  Do not bring valuables to the hospital.  [facility] is not responsible for any belongings or valuables.               Contacts, dentures or bridgework may not be worn into surgery.  Leave suitcase in the car. After surgery it may be brought to your room.  For patients admitted to the hospital, discharge time is determined by your treatment team.               Patients discharged the day of surgery will not be allowed to drive home.  Name and phone number of your driver:   Special Instructions: Please shower using CHG the night before and the morning of your surgery   Please read over the following fact sheets that you were given: Pain Booklet, Coughing and Deep Breathing, MRSA Information and Surgical Site Infection Prevention

## 2014-11-15 ENCOUNTER — Other Ambulatory Visit (HOSPITAL_COMMUNITY): Payer: Self-pay | Admitting: *Deleted

## 2014-11-15 ENCOUNTER — Other Ambulatory Visit: Payer: Self-pay | Admitting: Family Medicine

## 2014-11-15 ENCOUNTER — Other Ambulatory Visit: Payer: PPO

## 2014-11-15 ENCOUNTER — Telehealth: Payer: Self-pay | Admitting: Family Medicine

## 2014-11-15 DIAGNOSIS — Z01818 Encounter for other preprocedural examination: Secondary | ICD-10-CM

## 2014-11-15 DIAGNOSIS — Z9189 Other specified personal risk factors, not elsewhere classified: Secondary | ICD-10-CM

## 2014-11-15 LAB — CBC WITH DIFFERENTIAL/PLATELET
BASOS ABS: 0 10*3/uL (ref 0.0–0.1)
Basophils Relative: 0 % (ref 0–1)
Eosinophils Absolute: 0.2 10*3/uL (ref 0.0–0.7)
Eosinophils Relative: 2 % (ref 0–5)
HCT: 34.6 % — ABNORMAL LOW (ref 39.0–52.0)
Hemoglobin: 11.5 g/dL — ABNORMAL LOW (ref 13.0–17.0)
LYMPHS PCT: 31 % (ref 12–46)
Lymphs Abs: 2.4 10*3/uL (ref 0.7–4.0)
MCH: 28.7 pg (ref 26.0–34.0)
MCHC: 33.2 g/dL (ref 30.0–36.0)
MCV: 86.3 fL (ref 78.0–100.0)
MPV: 8.7 fL (ref 8.6–12.4)
Monocytes Absolute: 0.6 10*3/uL (ref 0.1–1.0)
Monocytes Relative: 8 % (ref 3–12)
NEUTROS ABS: 4.7 10*3/uL (ref 1.7–7.7)
NEUTROS PCT: 59 % (ref 43–77)
PLATELETS: 196 10*3/uL (ref 150–400)
RBC: 4.01 MIL/uL — AB (ref 4.22–5.81)
RDW: 15.6 % — AB (ref 11.5–15.5)
WBC: 7.9 10*3/uL (ref 4.0–10.5)

## 2014-11-15 LAB — BASIC METABOLIC PANEL
BUN: 22 mg/dL (ref 6–23)
CHLORIDE: 106 meq/L (ref 96–112)
CO2: 22 meq/L (ref 19–32)
CREATININE: 1.13 mg/dL (ref 0.50–1.35)
Calcium: 9.6 mg/dL (ref 8.4–10.5)
Glucose, Bld: 230 mg/dL — ABNORMAL HIGH (ref 70–99)
POTASSIUM: 4.5 meq/L (ref 3.5–5.3)
Sodium: 140 mEq/L (ref 135–145)

## 2014-11-15 LAB — SURGICAL PCR SCREEN
MRSA, PCR: POSITIVE — AB
Staphylococcus aureus: POSITIVE — AB

## 2014-11-15 LAB — PROTIME-INR
INR: 1.06 (ref <1.50)
Prothrombin Time: 13.8 seconds (ref 11.6–15.2)

## 2014-11-15 NOTE — Telephone Encounter (Signed)
Called patient.  He did not realize he needed labs yesterday.  Some one told him after CXR he could leave.  Is going to come by our office today to do lab work.  Also explain about sleep study and order for has been placed

## 2014-11-15 NOTE — Progress Notes (Signed)
Pt notified for positive MRSA and staph swab. Pt voiced understanding that he needs to pick up Mupirocin Rx at [facility] today and start using it twice a day.

## 2014-11-15 NOTE — Progress Notes (Signed)
Mupirocin Ointment Rx called into [facility] at [facility] for positive PCR of MRSA and Staph.

## 2014-11-17 NOTE — Progress Notes (Signed)
Patient had lab work done at [facility] office. Nurse will discontinue previous orders entered. Patient will need EKG DOS.

## 2014-11-21 MED ORDER — CEFAZOLIN SODIUM-DEXTROSE 2-3 GM-% IV SOLR
2.0000 g | INTRAVENOUS | Status: DC
  Filled 2014-11-21: qty 50

## 2014-11-21 MED ORDER — DEXAMETHASONE SODIUM PHOSPHATE 10 MG/ML IJ SOLN
10.0000 mg | INTRAMUSCULAR | Status: DC
  Filled 2014-11-21: qty 1

## 2014-11-22 ENCOUNTER — Ambulatory Visit (HOSPITAL_COMMUNITY)
Admission: RE | Admit: 2014-11-22 | Discharge: 2014-11-24 | Disposition: A | Discharge status: Skilled Nursing Facility | Payer: PPO | Source: Ambulatory Visit | Attending: Neurological Surgery | Admitting: Neurological Surgery

## 2014-11-22 ENCOUNTER — Encounter (HOSPITAL_COMMUNITY): Payer: Self-pay | Admitting: Certified Registered Nurse Anesthetist

## 2014-11-22 ENCOUNTER — Inpatient Hospital Stay (HOSPITAL_COMMUNITY): Payer: PPO

## 2014-11-22 ENCOUNTER — Encounter (HOSPITAL_COMMUNITY)
Admission: RE | Disposition: A | Discharge status: Skilled Nursing Facility | Payer: PPO | Source: Ambulatory Visit | Attending: Neurological Surgery

## 2014-11-22 ENCOUNTER — Inpatient Hospital Stay (HOSPITAL_COMMUNITY): Payer: PPO | Admitting: Certified Registered Nurse Anesthetist

## 2014-11-22 DIAGNOSIS — E780 Pure hypercholesterolemia: Secondary | ICD-10-CM | POA: Diagnosis not present

## 2014-11-22 DIAGNOSIS — Z79899 Other long term (current) drug therapy: Secondary | ICD-10-CM | POA: Insufficient documentation

## 2014-11-22 DIAGNOSIS — E1140 Type 2 diabetes mellitus with diabetic neuropathy, unspecified: Secondary | ICD-10-CM | POA: Insufficient documentation

## 2014-11-22 DIAGNOSIS — E119 Type 2 diabetes mellitus without complications: Secondary | ICD-10-CM | POA: Insufficient documentation

## 2014-11-22 DIAGNOSIS — M4806 Spinal stenosis, lumbar region: Principal | ICD-10-CM | POA: Insufficient documentation

## 2014-11-22 DIAGNOSIS — I10 Essential (primary) hypertension: Secondary | ICD-10-CM | POA: Diagnosis not present

## 2014-11-22 DIAGNOSIS — Z85828 Personal history of other malignant neoplasm of skin: Secondary | ICD-10-CM | POA: Insufficient documentation

## 2014-11-22 DIAGNOSIS — Z419 Encounter for procedure for purposes other than remedying health state, unspecified: Secondary | ICD-10-CM

## 2014-11-22 DIAGNOSIS — M1990 Unspecified osteoarthritis, unspecified site: Secondary | ICD-10-CM | POA: Insufficient documentation

## 2014-11-22 DIAGNOSIS — Z87891 Personal history of nicotine dependence: Secondary | ICD-10-CM | POA: Diagnosis not present

## 2014-11-22 DIAGNOSIS — Z7982 Long term (current) use of aspirin: Secondary | ICD-10-CM | POA: Diagnosis not present

## 2014-11-22 DIAGNOSIS — Z9889 Other specified postprocedural states: Secondary | ICD-10-CM

## 2014-11-22 HISTORY — PX: LUMBAR LAMINECTOMY/DECOMPRESSION MICRODISCECTOMY: SHX5026

## 2014-11-22 LAB — GLUCOSE, CAPILLARY
GLUCOSE-CAPILLARY: 139 mg/dL — AB (ref 70–99)
GLUCOSE-CAPILLARY: 167 mg/dL — AB (ref 70–99)
Glucose-Capillary: 154 mg/dL — ABNORMAL HIGH (ref 70–99)
Glucose-Capillary: 214 mg/dL — ABNORMAL HIGH (ref 70–99)

## 2014-11-22 SURGERY — LUMBAR LAMINECTOMY/DECOMPRESSION MICRODISCECTOMY 1 LEVEL
Anesthesia: General | Site: Back | Laterality: Bilateral

## 2014-11-22 MED ORDER — METFORMIN HCL 500 MG PO TABS
1000.0000 mg | ORAL_TABLET | Freq: Two times a day (BID) | ORAL | Status: DC
  Administered 2014-11-23 – 2014-11-24 (×3): 1000 mg via ORAL
  Filled 2014-11-22 (×5): qty 2

## 2014-11-22 MED ORDER — PROMETHAZINE HCL 25 MG/ML IJ SOLN
6.2500 mg | INTRAMUSCULAR | Status: DC | PRN
Start: 2014-11-22 — End: 2014-11-22

## 2014-11-22 MED ORDER — LISINOPRIL 10 MG PO TABS
10.0000 mg | ORAL_TABLET | Freq: Every day | ORAL | Status: DC
  Administered 2014-11-23 – 2014-11-24 (×2): 10 mg via ORAL
  Filled 2014-11-22 (×2): qty 1

## 2014-11-22 MED ORDER — MIDAZOLAM HCL 2 MG/2ML IJ SOLN
INTRAMUSCULAR | Status: AC
  Filled 2014-11-22: qty 2

## 2014-11-22 MED ORDER — ONDANSETRON HCL 4 MG/2ML IJ SOLN
INTRAMUSCULAR | Status: AC
  Filled 2014-11-22: qty 2

## 2014-11-22 MED ORDER — HYDROMORPHONE HCL 1 MG/ML IJ SOLN
INTRAMUSCULAR | Status: AC
  Filled 2014-11-22: qty 1

## 2014-11-22 MED ORDER — 0.9 % SODIUM CHLORIDE (POUR BTL) OPTIME
TOPICAL | Status: DC | PRN
  Administered 2014-11-22: 1000 mL

## 2014-11-22 MED ORDER — ROCURONIUM BROMIDE 50 MG/5ML IV SOLN
INTRAVENOUS | Status: AC
  Filled 2014-11-22: qty 1

## 2014-11-22 MED ORDER — PHENYLEPHRINE HCL 10 MG/ML IJ SOLN
10.0000 mg | INTRAMUSCULAR | Status: DC | PRN
  Administered 2014-11-22: 40 ug/min via INTRAVENOUS

## 2014-11-22 MED ORDER — SODIUM CHLORIDE 0.9 % IR SOLN
Status: DC | PRN
  Administered 2014-11-22: 14:00:00

## 2014-11-22 MED ORDER — BUPIVACAINE HCL (PF) 0.25 % IJ SOLN
INTRAMUSCULAR | Status: DC | PRN
  Administered 2014-11-22: 3 mL

## 2014-11-22 MED ORDER — EPHEDRINE SULFATE 50 MG/ML IJ SOLN
INTRAMUSCULAR | Status: AC
  Filled 2014-11-22: qty 1

## 2014-11-22 MED ORDER — CEFAZOLIN SODIUM 1-5 GM-% IV SOLN
1.0000 g | Freq: Three times a day (TID) | INTRAVENOUS | Status: AC
  Administered 2014-11-22 – 2014-11-23 (×2): 1 g via INTRAVENOUS
  Filled 2014-11-22 (×2): qty 50

## 2014-11-22 MED ORDER — GABAPENTIN 600 MG PO TABS
600.0000 mg | ORAL_TABLET | Freq: Three times a day (TID) | ORAL | Status: DC
  Administered 2014-11-22 – 2014-11-24 (×5): 600 mg via ORAL
  Filled 2014-11-22 (×7): qty 1

## 2014-11-22 MED ORDER — PHENOL 1.4 % MT LIQD: 1.0000 | OROMUCOSAL | Status: DC | PRN

## 2014-11-22 MED ORDER — LACTATED RINGERS IV SOLN
INTRAVENOUS | Status: DC | PRN
  Administered 2014-11-22 (×2): via INTRAVENOUS

## 2014-11-22 MED ORDER — PHENYLEPHRINE HCL 10 MG/ML IJ SOLN
INTRAMUSCULAR | Status: DC | PRN
Start: 2014-11-22 — End: 2014-11-22
  Administered 2014-11-22: 120 ug via INTRAVENOUS
  Administered 2014-11-22: 80 ug via INTRAVENOUS

## 2014-11-22 MED ORDER — LACTATED RINGERS IV SOLN: INTRAVENOUS | Status: DC

## 2014-11-22 MED ORDER — FENTANYL CITRATE 0.05 MG/ML IJ SOLN
INTRAMUSCULAR | Status: AC
  Filled 2014-11-22: qty 5

## 2014-11-22 MED ORDER — LINAGLIPTIN 5 MG PO TABS
5.0000 mg | ORAL_TABLET | Freq: Every day | ORAL | Status: DC
  Administered 2014-11-23 – 2014-11-24 (×2): 5 mg via ORAL
  Filled 2014-11-22 (×2): qty 1

## 2014-11-22 MED ORDER — HYDROCODONE-ACETAMINOPHEN 10-325 MG PO TABS
1.0000 | ORAL_TABLET | Freq: Four times a day (QID) | ORAL | Status: DC | PRN
  Administered 2014-11-22: 1 via ORAL
  Administered 2014-11-23 (×3): 2 via ORAL
  Administered 2014-11-24: 1 via ORAL
  Administered 2014-11-24: 2 via ORAL
  Filled 2014-11-22 (×3): qty 2
  Filled 2014-11-22: qty 1
  Filled 2014-11-22 (×2): qty 2

## 2014-11-22 MED ORDER — GLYCOPYRROLATE 0.2 MG/ML IJ SOLN
INTRAMUSCULAR | Status: DC | PRN
  Administered 2014-11-22: 0.4 mg via INTRAVENOUS

## 2014-11-22 MED ORDER — SODIUM CHLORIDE 0.9 % IJ SOLN
INTRAMUSCULAR | Status: AC
  Filled 2014-11-22: qty 10

## 2014-11-22 MED ORDER — HYDROMORPHONE HCL 1 MG/ML IJ SOLN
INTRAMUSCULAR | Status: AC
  Administered 2014-11-22: 0.5 mg
  Filled 2014-11-22: qty 1

## 2014-11-22 MED ORDER — SODIUM CHLORIDE 0.9 % IJ SOLN
3.0000 mL | Freq: Two times a day (BID) | INTRAMUSCULAR | Status: DC
  Administered 2014-11-22: 3 mL via INTRAVENOUS

## 2014-11-22 MED ORDER — MENTHOL 3 MG MT LOZG: 1.0000 | LOZENGE | OROMUCOSAL | Status: DC | PRN

## 2014-11-22 MED ORDER — POTASSIUM CHLORIDE IN NACL 20-0.9 MEQ/L-% IV SOLN
INTRAVENOUS | Status: DC
  Filled 2014-11-22 (×5): qty 1000

## 2014-11-22 MED ORDER — PHENYLEPHRINE 40 MCG/ML (10ML) SYRINGE FOR IV PUSH (FOR BLOOD PRESSURE SUPPORT)
PREFILLED_SYRINGE | INTRAVENOUS | Status: AC
  Filled 2014-11-22: qty 10

## 2014-11-22 MED ORDER — HYDROMORPHONE HCL 1 MG/ML IJ SOLN
0.2500 mg | INTRAMUSCULAR | Status: DC | PRN
  Administered 2014-11-22 (×3): 0.5 mg via INTRAVENOUS

## 2014-11-22 MED ORDER — FENTANYL CITRATE 0.05 MG/ML IJ SOLN
INTRAMUSCULAR | Status: DC | PRN
  Administered 2014-11-22: 150 ug via INTRAVENOUS

## 2014-11-22 MED ORDER — ASPIRIN EC 81 MG PO TBEC
81.0000 mg | DELAYED_RELEASE_TABLET | Freq: Every day | ORAL | Status: DC
  Administered 2014-11-23 – 2014-11-24 (×2): 81 mg via ORAL
  Filled 2014-11-22 (×2): qty 1

## 2014-11-22 MED ORDER — ONDANSETRON HCL 4 MG/2ML IJ SOLN: 4.0000 mg | INTRAMUSCULAR | Status: DC | PRN

## 2014-11-22 MED ORDER — LIDOCAINE HCL (CARDIAC) 20 MG/ML IV SOLN
INTRAVENOUS | Status: DC | PRN
  Administered 2014-11-22: 80 mg via INTRAVENOUS

## 2014-11-22 MED ORDER — CHLORHEXIDINE GLUCONATE CLOTH 2 % EX PADS
6.0000 | MEDICATED_PAD | Freq: Every day | CUTANEOUS | Status: DC
  Administered 2014-11-23 – 2014-11-24 (×2): 6 via TOPICAL

## 2014-11-22 MED ORDER — ACETAMINOPHEN 650 MG RE SUPP: 650.0000 mg | RECTAL | Status: DC | PRN

## 2014-11-22 MED ORDER — VANCOMYCIN HCL IN DEXTROSE 1-5 GM/200ML-% IV SOLN
INTRAVENOUS | Status: AC
  Administered 2014-11-22: 1000 mg via INTRAVENOUS
  Filled 2014-11-22: qty 200

## 2014-11-22 MED ORDER — ONDANSETRON HCL 4 MG/2ML IJ SOLN
INTRAMUSCULAR | Status: DC | PRN
Start: 2014-11-22 — End: 2014-11-22
  Administered 2014-11-22: 4 mg via INTRAVENOUS

## 2014-11-22 MED ORDER — NEOSTIGMINE METHYLSULFATE 10 MG/10ML IV SOLN
INTRAVENOUS | Status: DC | PRN
  Administered 2014-11-22: 3 mg via INTRAVENOUS

## 2014-11-22 MED ORDER — ROCURONIUM BROMIDE 100 MG/10ML IV SOLN
INTRAVENOUS | Status: DC | PRN
  Administered 2014-11-22: 40 mg via INTRAVENOUS

## 2014-11-22 MED ORDER — GLIPIZIDE 10 MG PO TABS
10.0000 mg | ORAL_TABLET | Freq: Every day | ORAL | Status: DC
  Administered 2014-11-23 – 2014-11-24 (×2): 10 mg via ORAL
  Filled 2014-11-22 (×3): qty 1

## 2014-11-22 MED ORDER — PROPOFOL 10 MG/ML IV BOLUS
INTRAVENOUS | Status: AC
  Filled 2014-11-22: qty 20

## 2014-11-22 MED ORDER — THROMBIN 5000 UNITS EX SOLR
CUTANEOUS | Status: DC | PRN
  Administered 2014-11-22 (×2): 5000 [IU] via TOPICAL

## 2014-11-22 MED ORDER — LIDOCAINE HCL (CARDIAC) 20 MG/ML IV SOLN
INTRAVENOUS | Status: AC
  Filled 2014-11-22: qty 5

## 2014-11-22 MED ORDER — SODIUM CHLORIDE 0.9 % IJ SOLN: 3.0000 mL | INTRAMUSCULAR | Status: DC | PRN

## 2014-11-22 MED ORDER — THROMBIN 5000 UNITS EX SOLR
CUTANEOUS | Status: DC | PRN
  Administered 2014-11-22: 15:00:00 via TOPICAL

## 2014-11-22 MED ORDER — MIDAZOLAM HCL 5 MG/5ML IJ SOLN
INTRAMUSCULAR | Status: DC | PRN
  Administered 2014-11-22: 2 mg via INTRAVENOUS

## 2014-11-22 MED ORDER — NIACIN ER (ANTIHYPERLIPIDEMIC) 500 MG PO TBCR
500.0000 mg | EXTENDED_RELEASE_TABLET | Freq: Every day | ORAL | Status: DC
  Administered 2014-11-22 – 2014-11-23 (×2): 500 mg via ORAL
  Filled 2014-11-22 (×3): qty 1

## 2014-11-22 MED ORDER — PROPOFOL 10 MG/ML IV BOLUS
INTRAVENOUS | Status: DC | PRN
  Administered 2014-11-22: 170 mg via INTRAVENOUS

## 2014-11-22 MED ORDER — HEMOSTATIC AGENTS (NO CHARGE) OPTIME
TOPICAL | Status: DC | PRN
  Administered 2014-11-22: 1 via TOPICAL

## 2014-11-22 MED ORDER — MORPHINE SULFATE 2 MG/ML IJ SOLN: 1.0000 mg | INTRAMUSCULAR | Status: DC | PRN

## 2014-11-22 MED ORDER — ACETAMINOPHEN 325 MG PO TABS: 650.0000 mg | ORAL_TABLET | ORAL | Status: DC | PRN

## 2014-11-22 SURGICAL SUPPLY — 49 items
BAG DECANTER FOR FLEXI CONT (MISCELLANEOUS) ×3 IMPLANT
BENZOIN TINCTURE PRP APPL 2/3 (GAUZE/BANDAGES/DRESSINGS) ×3 IMPLANT
BUR MATCHSTICK NEURO 3.0 LAGG (BURR) ×3 IMPLANT
CANISTER SUCT 3000ML PPV (MISCELLANEOUS) ×3 IMPLANT
CLOSURE WOUND 1/2 X4 (GAUZE/BANDAGES/DRESSINGS) ×1
CONT SPEC 4OZ CLIKSEAL STRL BL (MISCELLANEOUS) ×3 IMPLANT
DRAPE LAPAROTOMY 100X72X124 (DRAPES) ×3 IMPLANT
DRAPE MICROSCOPE LEICA (MISCELLANEOUS) ×3 IMPLANT
DRAPE POUCH INSTRU U-SHP 10X18 (DRAPES) ×3 IMPLANT
DRAPE SURG 17X23 STRL (DRAPES) ×3 IMPLANT
DRSG OPSITE 4X5.5 SM (GAUZE/BANDAGES/DRESSINGS) IMPLANT
DRSG OPSITE POSTOP 4X6 (GAUZE/BANDAGES/DRESSINGS) ×3 IMPLANT
DRSG TELFA 3X8 NADH (GAUZE/BANDAGES/DRESSINGS) IMPLANT
DURAPREP 26ML APPLICATOR (WOUND CARE) ×3 IMPLANT
ELECT REM PT RETURN 9FT ADLT (ELECTROSURGICAL) ×3
ELECTRODE REM PT RTRN 9FT ADLT (ELECTROSURGICAL) ×1 IMPLANT
GAUZE SPONGE 4X4 16PLY XRAY LF (GAUZE/BANDAGES/DRESSINGS) IMPLANT
GLOVE BIO SURGEON STRL SZ8 (GLOVE) ×3 IMPLANT
GLOVE BIO SURGEON STRL SZ8.5 (GLOVE) ×3 IMPLANT
GLOVE BIOGEL PI IND STRL 7.5 (GLOVE) ×4 IMPLANT
GLOVE BIOGEL PI INDICATOR 7.5 (GLOVE) ×8
GLOVE SS BIOGEL STRL SZ 8 (GLOVE) ×1 IMPLANT
GLOVE SUPERSENSE BIOGEL SZ 8 (GLOVE) ×2
GLOVE SURG SS PI 7.0 STRL IVOR (GLOVE) ×9 IMPLANT
GOWN STRL REUS W/ TWL LRG LVL3 (GOWN DISPOSABLE) IMPLANT
GOWN STRL REUS W/ TWL XL LVL3 (GOWN DISPOSABLE) ×4 IMPLANT
GOWN STRL REUS W/TWL 2XL LVL3 (GOWN DISPOSABLE) IMPLANT
GOWN STRL REUS W/TWL LRG LVL3 (GOWN DISPOSABLE)
GOWN STRL REUS W/TWL XL LVL3 (GOWN DISPOSABLE) ×8
HEMOSTAT POWDER KIT SURGIFOAM (HEMOSTASIS) IMPLANT
HEMOSTAT POWDER SURGIFOAM 1G (HEMOSTASIS) ×3 IMPLANT
KIT BASIN OR (CUSTOM PROCEDURE TRAY) ×3 IMPLANT
KIT ROOM TURNOVER OR (KITS) ×3 IMPLANT
NEEDLE HYPO 25X1 1.5 SAFETY (NEEDLE) ×3 IMPLANT
NEEDLE SPNL 20GX3.5 QUINCKE YW (NEEDLE) ×3 IMPLANT
NS IRRIG 1000ML POUR BTL (IV SOLUTION) ×3 IMPLANT
PACK LAMINECTOMY NEURO (CUSTOM PROCEDURE TRAY) ×3 IMPLANT
PAD ARMBOARD 7.5X6 YLW CONV (MISCELLANEOUS) ×15 IMPLANT
RUBBERBAND STERILE (MISCELLANEOUS) ×6 IMPLANT
SPONGE SURGIFOAM ABS GEL SZ50 (HEMOSTASIS) ×3 IMPLANT
STRIP CLOSURE SKIN 1/2X4 (GAUZE/BANDAGES/DRESSINGS) ×2 IMPLANT
SUT VIC AB 0 CT1 18XCR BRD8 (SUTURE) ×1 IMPLANT
SUT VIC AB 0 CT1 8-18 (SUTURE) ×2
SUT VIC AB 2-0 CP2 18 (SUTURE) ×3 IMPLANT
SUT VIC AB 3-0 SH 8-18 (SUTURE) ×6 IMPLANT
SYR 20ML ECCENTRIC (SYRINGE) ×3 IMPLANT
TOWEL OR 17X24 6PK STRL BLUE (TOWEL DISPOSABLE) ×3 IMPLANT
TOWEL OR 17X26 10 PK STRL BLUE (TOWEL DISPOSABLE) ×3 IMPLANT
WATER STERILE IRR 1000ML POUR (IV SOLUTION) ×3 IMPLANT

## 2014-11-22 NOTE — Anesthesia Procedure Notes (Signed)
Procedure Name: Intubation Date/Time: 11/22/2014 1:07 PM Performed by: Carney Living Pre-anesthesia Checklist: Patient identified, Emergency Drugs available, Suction available, Patient being monitored and Timeout performed Patient Re-evaluated:Patient Re-evaluated prior to inductionOxygen Delivery Method: Circle system utilized Preoxygenation: Pre-oxygenation with 100% oxygen Intubation Type: IV induction Ventilation: Mask ventilation without difficulty Laryngoscope Size: Mac and 4 Grade View: Grade I Tube type: Oral Tube size: 7.5 mm Number of attempts: 1 Airway Equipment and Method: Stylet Placement Confirmation: ETT inserted through vocal cords under direct vision and breath sounds checked- equal and bilateral Secured at: 23 cm Tube secured with: Tape Dental Injury: Teeth and Oropharynx as per pre-operative assessment

## 2014-11-22 NOTE — Anesthesia Postprocedure Evaluation (Signed)
  Anesthesia Post-op Note  Patient: Christian [name]  Procedure(s) Performed: Procedure(s): Laminectomy and foraminotomy lumbar three-lumbar four bilateral  (Bilateral)  Patient Location: PACU  Anesthesia Type:General  Level of Consciousness: awake  Airway and Oxygen Therapy: Patient Spontanous Breathing  Post-op Pain: mild  Post-op Assessment: Post-op Vital signs reviewed  Post-op Vital Signs: Reviewed  Last Vitals:  Filed Vitals:   11/22/14 1740  BP:   Pulse: 61  Temp: 37.3 C  Resp: 11    Complications: No apparent anesthesia complications

## 2014-11-22 NOTE — Progress Notes (Signed)
Patient left for OR prior to bag of LR being scanned.

## 2014-11-22 NOTE — Anesthesia Preprocedure Evaluation (Addendum)
Anesthesia Evaluation  Patient identified by MRN, date of birth, ID band Patient awake    Reviewed: Allergy & Precautions, NPO status , Patient's Chart, lab work & pertinent test results  Airway Mallampati: II  TM Distance: >3 FB Neck ROM: Full    Dental  (+) Poor Dentition, Missing, Dental Advisory Given   Pulmonary former smoker,  breath sounds clear to auscultation        Cardiovascular hypertension, Pt. on medications Rhythm:Regular Rate:Normal     Neuro/Psych    GI/Hepatic negative GI ROS, Neg liver ROS,   Endo/Other  diabetes, Type 2, Oral Hypoglycemic Agents  Renal/GU negative Renal ROS     Musculoskeletal  (+) Arthritis -, Osteoarthritis,    Abdominal   Peds  Hematology   Anesthesia Other Findings   Reproductive/Obstetrics                           Anesthesia Physical Anesthesia Plan  ASA: III  Anesthesia Plan: General   Post-op Pain Management:    Induction: Intravenous  Airway Management Planned: Oral ETT  Additional Equipment:   Intra-op Plan:   Post-operative Plan: Extubation in OR  Informed Consent: I have reviewed the patients History and Physical, chart, labs and discussed the procedure including the risks, benefits and alternatives for the proposed anesthesia with the patient or authorized representative who has indicated his/her understanding and acceptance.   Dental advisory given  Plan Discussed with: CRNA and Anesthesiologist  Anesthesia Plan Comments:         Anesthesia Quick Evaluation

## 2014-11-22 NOTE — Op Note (Signed)
11/22/2014  3:03 PM  PATIENT:  Christian [name]  80 y.o. male  PRE-OPERATIVE DIAGNOSIS:  Lumbar spinal stenosis L3-4 with back and bilateral leg pain  POST-OPERATIVE DIAGNOSIS:  Same  PROCEDURE:  Decompressive lumbar laminectomy, medial facetectomy, foraminotomy at L3-4 bilaterally for central canal and nerve root decompression  SURGEON:  Sherley Bounds, MD  ASSISTANTS: Dr. Arnoldo Morale  ANESTHESIA:   General  EBL: Less than 50 ml  Total I/O In: 1000 [I.V.:1000] Out: -   BLOOD ADMINISTERED:none  DRAINS: None   SPECIMEN:  No Specimen  INDICATION FOR PROCEDURE: This patient presented with back and bilateral leg pain. MRI showed spinal stenosis at L3-4. He tried medical management without relief. I recommended decompressive hemilaminectomies at L3-4 for spinal stenosis. Patient understood the risks, benefits, and alternatives and potential outcomes and wished to proceed.  PROCEDURE DETAILS: The patient was taken to the operating room and after induction of adequate generalized endotracheal anesthesia, the patient was rolled into the prone position on the Wilson frame and all pressure points were padded. The lumbar region was cleaned and then prepped with DuraPrep and draped in the usual sterile fashion. 5 cc of local anesthesia was injected and then a dorsal midline incision was made and carried down to the lumbo sacral fascia. The fascia was opened and the paraspinous musculature was taken down in a subperiosteal fashion to expose L3-4 bilaterally. Intraoperative x-ray confirmed my level, and then I used a combination of the high-speed drill and the Kerrison punches to perform a hemilaminectomy, medial facetectomy, and foraminotomy at L3-4 bilaterally. The underlying yellow ligament was opened and removed in a piecemeal fashion to expose the underlying dura and exiting nerve root. I undercut the lateral recess bilaterally and dissected down until I was medial to and distal to the pedicle. The  nerve root was well decompressed. We then gently retracted the nerve root medially with a retractor, coagulated the epidural venous vasculature, and inspected the disc space. No significant disc herniation was identified. I then palpated with a coronary dilator along the nerve root and into the foramen to assure adequate decompression. I felt no more compression of the nerve root. I irrigated with saline solution containing bacitracin. Achieved hemostasis with bipolar cautery, lined the dura with Gelfoam, and then closed the fascia with 0 Vicryl. I closed the subcutaneous tissues with 2-0 Vicryl and the subcuticular tissues with 3-0 Vicryl. The skin was then closed with benzoin and Steri-Strips. The drapes were removed, a sterile dressing was applied. The patient was awakened from general anesthesia and transferred to the recovery room in stable condition. At the end of the procedure all sponge, needle and instrument counts were correct.   PLAN OF CARE: Admit for overnight observation  PATIENT DISPOSITION:  PACU - hemodynamically stable.   Delay start of Pharmacological VTE agent (>24hrs) due to surgical blood loss or risk of bleeding:  yes

## 2014-11-22 NOTE — Transfer of Care (Signed)
Immediate Anesthesia Transfer of Care Note  Patient: Christian [name]  Procedure(s) Performed: Procedure(s): Laminectomy and foraminotomy lumbar three-lumbar four bilateral  (Bilateral)  Patient Location: PACU  Anesthesia Type:General  Level of Consciousness: awake, alert , oriented and patient cooperative  Airway & Oxygen Therapy: Patient Spontanous Breathing and Patient connected to nasal cannula oxygen  Post-op Assessment: Report given to RN and Post -op Vital signs reviewed and stable  Post vital signs: Reviewed and stable  Last Vitals:  Filed Vitals:   11/22/14 1113  BP: 128/68  Pulse: 69  Temp: 36.5 C  Resp: 18    Complications: No apparent anesthesia complications

## 2014-11-22 NOTE — H&P (Signed)
Subjective: Patient is a 80 y.o. male admitted for lum lam for stenosis. Onset of symptoms was several months ago, gradually worsening since that time.  The pain is rated moderate, and is located at the across the lower back and radiates to legs. The pain is described as aching and occurs intermittently. The symptoms have been progressive. Symptoms are exacerbated by exercise. MRI or CT showed stenosis L3-4   Past Medical History  Diagnosis Date  . Hypercholesterolemia   . Diabetic neuropathy   . Skin cancer of arm 04/20/2013    left upper arm  . Spinal stenosis 05/17/2014    L3-L4   . 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 HTN (hypertension) 03/03/2013  . Diabetes mellitus     Type 2  . History of kidney stones   . Urgency of urination   . Arthritis     Past Surgical History  Procedure Laterality Date  . Skin cancer excision Left     shoulder and hand    Prior to Admission medications   Medication Sig Start Date End Date Taking? Authorizing Provider  ACCU-CHEK AVIVA PLUS test strip USE ONE STRIP TO CHECK GLUCOSE TWICE DAILY 11/02/14  Yes Orlena Sheldon, PA-C  acetaminophen (TYLENOL) 500 MG tablet Take 1,000 mg by mouth every 8 (eight) hours as needed for mild pain or moderate pain (back pain).   Yes Historical Provider, MD  aspirin EC 81 MG tablet Take 81 mg by mouth daily.     Yes Historical Provider, MD  aspirin-sod bicarb-citric acid (ALKA-SELTZER) 325 MG TBEF tablet Take 325 mg by mouth daily as needed (morning congestion).   Yes Historical Provider, MD  cholecalciferol (VITAMIN D) 1000 UNITS tablet Take 1,000 Units by mouth daily.     Yes Historical Provider, MD  Cyanocobalamin (B-12 PO) Take 1 tablet by mouth daily.   Yes Historical Provider, MD  fish oil-omega-3 fatty acids 1000 MG capsule Take 3 g by mouth 2 (two) times daily.     Yes Historical Provider, MD  gabapentin (NEURONTIN) 600 MG tablet TAKE ONE TABLET BY MOUTH THREE TIMES DAILY 09/25/14  Yes Orlena Sheldon, PA-C   glipiZIDE (GLUCOTROL) 10 MG tablet TAKE ONE TABLET BY MOUTH TWICE DAILY BEFORE  A  MEAL 10/24/14  Yes Orlena Sheldon, PA-C  HYDROcodone-acetaminophen (NORCO) 10-325 MG per tablet Take 1 tablet by mouth 3 (three) times daily as needed (per Dr Nelva Bush).   Yes Historical Provider, MD  JANUVIA 100 MG tablet TAKE ONE TABLET BY MOUTH ONCE DAILY 11/01/14  Yes Orlena Sheldon, PA-C  lisinopril (PRINIVIL,ZESTRIL) 10 MG tablet TAKE ONE TABLET BY MOUTH ONCE DAILY 10/09/14  Yes Orlena Sheldon, PA-C  metFORMIN (GLUCOPHAGE) 1000 MG tablet TAKE ONE TABLET BY MOUTH TWICE DAILY WITH MEALS 09/19/14  Yes Orlena Sheldon, PA-C  Multiple Vitamin (MULITIVITAMIN WITH MINERALS) TABS Take 1 tablet by mouth daily.     Yes Historical Provider, MD  niacin (NIASPAN) 500 MG CR tablet TAKE ONE TABLET BY MOUTH AT BEDTIME 10/25/14  Yes Lonie Peak Dixon, PA-C  potassium gluconate 595 MG TABS tablet Take 595 mg by mouth daily.   Yes Historical Provider, MD  simvastatin (ZOCOR) 40 MG tablet TAKE ONE TABLET BY MOUTH ONCE DAILY AT BEDTIME 07/25/14  Yes Orlena Sheldon, PA-C   No Known Allergies  History  Substance Use Topics  . Smoking status: Former Smoker    Quit date: 03/04/2003  . Smokeless tobacco: Never Used  . Alcohol Use: No  History reviewed. No pertinent family history.   Review of Systems  Positive ROS: neg  All other systems have been reviewed and were otherwise negative with the exception of those mentioned in the HPI and as above.  Objective: Vital signs in last 24 hours: Temp:  [97.7 F (36.5 C)] 97.7 F (36.5 C) (02/17 1113) Pulse Rate:  [69] 69 (02/17 1113) Resp:  [18] 18 (02/17 1113) BP: (128)/(68) 128/68 mmHg (02/17 1113) SpO2:  [97 %] 97 % (02/17 1113) Weight:  [215 lb 3.2 oz (97.614 kg)] 215 lb 3.2 oz (97.614 kg) (02/17 1113)  General Appearance: Alert, cooperative, no distress, appears stated age Head: Normocephalic, without obvious abnormality, atraumatic Eyes: PERRL, conjunctiva/corneas clear, EOM's intact     Neck: Supple, symmetrical, trachea midline Back: Symmetric, no curvature, ROM normal, no CVA tenderness Lungs:  respirations unlabored Heart: Regular rate and rhythm Abdomen: Soft, non-tender Extremities: Extremities normal, atraumatic, no cyanosis or edema Pulses: 2+ and symmetric all extremities Skin: Skin color, texture, turgor normal, no rashes or lesions  NEUROLOGIC:   Mental status: Alert and oriented x4,  no aphasia, good attention span, fund of knowledge, and memory Motor Exam - grossly normal Sensory Exam - grossly normal Reflexes: 1= Coordination - grossly normal Gait - grossly normal Balance - grossly normal Cranial Nerves: I: smell Not tested  II: visual acuity  OS: nl    OD: nl  II: visual fields Full to confrontation  II: pupils Equal, round, reactive to light  III,VII: ptosis None  III,IV,VI: extraocular muscles  Full ROM  V: mastication Normal  V: facial light touch sensation  Normal  V,VII: corneal reflex  Present  VII: facial muscle function - upper  Normal  VII: facial muscle function - lower Normal  VIII: hearing Not tested  IX: soft palate elevation  Normal  IX,X: gag reflex Present  XI: trapezius strength  5/5  XI: sternocleidomastoid strength 5/5  XI: neck flexion strength  5/5  XII: tongue strength  Normal    Data Review Lab Results  Component Value Date   WBC 7.9 11/15/2014   HGB 11.5* 11/15/2014   HCT 34.6* 11/15/2014   MCV 86.3 11/15/2014   PLT 196 11/15/2014   Lab Results  Component Value Date   NA 140 11/15/2014   K 4.5 11/15/2014   CL 106 11/15/2014   CO2 22 11/15/2014   BUN 22 11/15/2014   CREATININE 1.13 11/15/2014   GLUCOSE 230* 11/15/2014   Lab Results  Component Value Date   INR 1.06 11/15/2014    Assessment/Plan: Patient admitted for lumbar lam for stenosis L3-4. Patient has failed a reasonable attempt at conservative therapy.  I explained the condition and procedure to the patient and answered any questions.   Patient wishes to proceed with procedure as planned. Understands risks/ benefits and typical outcomes of procedure.   Malaiyah Achorn S 11/22/2014 12:14 PM

## 2014-11-22 NOTE — Plan of Care (Signed)
Problem: Consults Goal: Diagnosis - Spinal Surgery Outcome: Completed/Met Date Met:  11/22/14 Lumbar Laminectomy (Complex)     

## 2014-11-23 ENCOUNTER — Encounter (HOSPITAL_COMMUNITY): Payer: Self-pay | Admitting: Neurological Surgery

## 2014-11-23 DIAGNOSIS — M4806 Spinal stenosis, lumbar region: Secondary | ICD-10-CM | POA: Diagnosis not present

## 2014-11-23 LAB — GLUCOSE, CAPILLARY
GLUCOSE-CAPILLARY: 189 mg/dL — AB (ref 70–99)
Glucose-Capillary: 166 mg/dL — ABNORMAL HIGH (ref 70–99)
Glucose-Capillary: 186 mg/dL — ABNORMAL HIGH (ref 70–99)
Glucose-Capillary: 143 mg/dL — ABNORMAL HIGH (ref 70–99)

## 2014-11-23 MED ORDER — INSULIN ASPART 100 UNIT/ML ~~LOC~~ SOLN
0.0000 [IU] | Freq: Three times a day (TID) | SUBCUTANEOUS | Status: DC
Start: 2014-11-23 — End: 2014-11-24
  Administered 2014-11-23: 3 [IU] via SUBCUTANEOUS
  Administered 2014-11-24: 2 [IU] via SUBCUTANEOUS
  Administered 2014-11-24: 3 [IU] via SUBCUTANEOUS

## 2014-11-23 MED ORDER — INSULIN ASPART 100 UNIT/ML ~~LOC~~ SOLN: 0.0000 [IU] | Freq: Every day | SUBCUTANEOUS | Status: DC

## 2014-11-23 NOTE — Progress Notes (Signed)
Subjective: Patient reports appropriate back soreness, no leg pain, apparently he walks several times with a walker in the halls yesterday and did fine. Today he seems a little off balanced especially with sitting. He tends to want to lean back.  Objective: Vital signs in last 24 hours: Temp:  [97.3 F (36.3 C)-99.5 F (37.5 C)] 99.5 F (37.5 C) (02/18 0400) Pulse Rate:  [61-108] 102 (02/18 0400) Resp:  [11-22] 20 (02/18 0400) BP: (128-186)/(49-75) 148/57 mmHg (02/18 0400) SpO2:  [92 %-100 %] 97 % (02/18 0400) Weight:  [215 lb 3.2 oz (97.614 kg)] 215 lb 3.2 oz (97.614 kg) (02/17 1113)  Intake/Output from previous day: 02/17 0730 - 02/18 0729 In: 1500 [I.V.:1500] Out: 50 [Blood:50] Intake/Output this shift:    Neurologic: Grossly normal, though he has somewhat difficulty with sitting balance right now  Lab Results: Lab Results  Component Value Date   WBC 7.9 11/15/2014   HGB 11.5* 11/15/2014   HCT 34.6* 11/15/2014   MCV 86.3 11/15/2014   PLT 196 11/15/2014   Lab Results  Component Value Date   INR 1.06 11/15/2014   BMET Lab Results  Component Value Date   NA 140 11/15/2014   K 4.5 11/15/2014   CL 106 11/15/2014   CO2 22 11/15/2014   GLUCOSE 230* 11/15/2014   BUN 22 11/15/2014   CREATININE 1.13 11/15/2014   CALCIUM 9.6 11/15/2014    Studies/Results: Dg Lumbar Spine 2-3 Views  11/22/2014   CLINICAL DATA:  L3-4 laminectomy.  EXAM: LUMBAR SPINE - 2-3 VIEW  COMPARISON:  03/14/2014  FINDINGS: Examination demonstrates normal vertebral body alignment and heights. There is moderate spondylosis of the lumbar spine. There is moderate disc space narrowing at the L3-4 level in to a lesser extent at the L2-3 level. Moderate facet arthropathy is present. Initial film demonstrates a metallic instrument with tip between the L3 and L4 spinous processes. Subsequent image demonstrates metallic surgical instrument with tip over the posterior elements at the L3-4 level.  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 Surgical instrument with tip over the posterior elements at the L3-4 level.   Electronically Signed   By: Marin Olp M.D.   On: 11/22/2014 15:26    Assessment/Plan: Seems to be doing okay other than some difficulty with balance. I will ask physical therapy to evaluate him.   LOS: 1 day    Dwanda Tufano S 11/23/2014, 7:32 AM

## 2014-11-23 NOTE — Progress Notes (Signed)
Patient appears to be too unsteady on his feet to be safe for discharge home today. I have ordered physical therapy and he will be discharged when he appears to be more steady on his feet and less of a fall risk

## 2014-11-23 NOTE — Evaluation (Addendum)
Occupational Therapy Evaluation Patient Details Name: Christian [name] MRN: [id number] DOB: 1935/06/05 Today's Date: 11/23/2014    History of Present Illness pt admitted with stenosis at L34, s/p Decompressive lumbar laminectomy, medial facetectomy, foraminotomy at L3-4 bilaterally for central canal and nerve root decompression   Clinical Impression   PTA pt lived at home and was independent with ADLs. Pt's daughter was present and provided PLOF information due to pt's decreased attention and difficulty answering questions. Pt currently limited by pain, decreased cognition, and decreased ROM and is not safe to go home at this time. Pt lives alone and does not have 24/7 Supervision and safest d/c venue would be SNF at this time. Pt will benefit from acute OT to address functional mobility and ADLs while maintaining back precautions.      Follow Up Recommendations  SNF;Supervision/Assistance - 24 hour    Equipment Recommendations  3 in 1 bedside comode    Recommendations for Other Services       Precautions / Restrictions Precautions Precautions: Back Precaution Booklet Issued: Yes (comment) Precaution Comments: Pt unable to recall back precautions from PT session immediately before OT. Educated pt on 3/3 back precautions and incorporating into ADLs.  Restrictions Weight Bearing Restrictions: No      Mobility Bed Mobility Overal bed mobility: Needs Assistance Bed Mobility: Rolling;Sidelying to Sit;Sit to Sidelying Rolling: Min assist Sidelying to sit: Min assist     Sit to sidelying: Min assist General bed mobility comments: VC's and tactile cues for sequencing and safety. Min A to help pt perform mobility.   Transfers Overall transfer level: Needs assistance Equipment used: Rolling walker (2 wheeled) Transfers: Sit to/from Stand Sit to Stand: Min assist         General transfer comment: cued for hand placement and technique    Balance Overall balance assessment:  Needs assistance Sitting-balance support: Single extremity supported;Feet supported Sitting balance-Leahy Scale: Poor Sitting balance - Comments: due to pain, pt visibly shaking   Standing balance support: Bilateral upper extremity supported;During functional activity Standing balance-Leahy Scale: Poor                              ADL Overall ADL's : Needs assistance/impaired Eating/Feeding: Independent;Sitting   Grooming: Min guard;Standing;Wash/dry hands   Upper Body Bathing: Set up;Sitting   Lower Body Bathing: Sit to/from stand;Moderate assistance Lower Body Bathing Details (indicate cue type and reason): min A to stand, assist to reach Bil feet Upper Body Dressing : Set up;Sitting   Lower Body Dressing: Maximal assistance;Sit to/from stand   Toilet Transfer: Minimal assistance;Ambulation;RW Toilet Transfer Details (indicate cue type and reason): sit<>stand with min A; pt stood over toilet Toileting- Clothing Manipulation and Hygiene: Minimal assistance;Sit to/from stand       Functional mobility during ADLs: Minimal assistance;Rolling walker       Vision  Pt reports no change from baseline.           Pertinent Vitals/Pain Pain Assessment: Faces Faces Pain Scale: Hurts whole lot Pain Location: back Pain Descriptors / Indicators: Aching;Grimacing Pain Intervention(s): Limited activity within patient's tolerance;Monitored during session;Repositioned;Patient requesting pain meds-RN notified     Hand Dominance     Extremity/Trunk Assessment Upper Extremity Assessment Upper Extremity Assessment: Overall WFL for tasks assessed   Lower Extremity Assessment Lower Extremity Assessment: Generalized weakness   Cervical / Trunk Assessment Cervical / Trunk Assessment: Normal   Communication Communication Communication: No difficulties   Cognition Arousal/Alertness:  Awake/alert Behavior During Therapy: WFL for tasks assessed/performed Overall Cognitive  Status: Impaired/Different from baseline Area of Impairment: Attention;Memory;Following commands;Safety/judgement;Problem solving   Current Attention Level: Sustained Memory: Decreased short-term memory;Decreased recall of precautions Following Commands: Follows one step commands with increased time;Follows one step commands inconsistently Safety/Judgement: Decreased awareness of safety;Decreased awareness of deficits   Problem Solving: Slow processing;Decreased initiation;Difficulty sequencing;Requires tactile cues;Requires verbal cues General Comments: Pt not answering questions appropriately. Pt's daughter reports he has been "off" for a bit but this is apparently worse than normal.    General Comments    Educated pt and dtr on back care/ advised precautions, lifting restrictions, log roll a dn bed mobility, progression of activity.            Home Living Family/patient expects to be discharged to:: Private residence Living Arrangements: Alone Available Help at Discharge: Available PRN/intermittently Type of Home: Mobile home Home Access: Ramped entrance     [place]: One level     Bathroom Shower/Tub: Tub/shower unit Shower/tub characteristics: Architectural technologist: Standard     Home Equipment: Cane - single point   Additional Comments: Got by independently      Prior Functioning/Environment Level of Independence: Independent             OT Diagnosis: Generalized weakness;Acute pain   OT Problem List: Decreased strength;Decreased range of motion;Decreased activity tolerance;Impaired balance (sitting and/or standing);Decreased cognition;Decreased safety awareness;Decreased knowledge of use of DME or AE;Decreased knowledge of precautions;Pain   OT Treatment/Interventions: Self-care/ADL training;Therapeutic exercise;Neuromuscular education;Energy conservation;DME and/or AE instruction;Therapeutic activities;Cognitive remediation/compensation;Patient/family  education;Balance training    OT Goals(Current goals can be found in the care plan section) Acute Rehab OT Goals Patient Stated Goal: get home OT Goal Formulation: With patient Time For Goal Achievement: 12/07/14 Potential to Achieve Goals: Good ADL Goals Pt Will Perform Grooming: with supervision;standing Pt Will Perform Lower Body Bathing: with set-up;with supervision;with adaptive equipment;sit to/from stand Pt Will Perform Lower Body Dressing: with set-up;with supervision;with adaptive equipment;sit to/from stand Pt Will Transfer to Toilet: with supervision;ambulating;bedside commode Pt Will Perform Toileting - Clothing Manipulation and hygiene: with supervision;sit to/from stand  OT Frequency: Min 2X/week   Barriers to D/C: Decreased caregiver support             End of Session Equipment Utilized During Treatment: Gait belt;Rolling walker Nurse Communication: Patient requests pain meds  Activity Tolerance: Patient limited by pain Patient left: in bed;with call bell/phone within reach;with family/visitor present   Time: 7124-5809 OT Time Calculation (min): 24 min Charges:  OT General Charges $OT Visit: 1 Procedure OT Evaluation $Initial OT Evaluation Tier I: 1 Procedure OT Treatments $Self Care/Home Management : 8-22 mins G-Codes: OT G-codes **NOT FOR INPATIENT CLASS** Functional Assessment Tool Used: clinical judgement Functional Limitation: Self care Self Care Current Status (X8338): At least 40 percent but less than 60 percent impaired, limited or restricted Self Care Goal Status (S5053): At least 1 percent but less than 20 percent impaired, limited or restricted  Juluis Rainier 11/23/2014, 6:30 PM  Cyndie Chime, OTR/L Occupational Therapist [phone] (pager)

## 2014-11-23 NOTE — Progress Notes (Signed)
Inpatient Diabetes Program Recommendations  AACE/ADA: New Consensus Statement on Inpatient Glycemic Control (2013)  Target Ranges:  Prepandial:   less than 140 mg/dL      Peak postprandial:   less than 180 mg/dL (1-2 hours)      Critically ill patients:  140 - 180 mg/dL   Results for Christian [name], Christian [name] (MRN [id number]) as of 11/23/2014 09:55  Ref. Range 11/22/2014 11:12 11/22/2014 15:12 11/22/2014 18:33 11/22/2014 21:31 11/23/2014 08:16  Glucose-Capillary Latest Range: 70-99 mg/dL 154 (H) 139 (H) 167 (H) 214 (H) 166 (H)    Diabetes history: DM2 Outpatient Diabetes medications: Glipzide 10 mg BID, Januvia 100 mg daily, Metformin 1000 mg BID Current orders for Inpatient glycemic control: Tradjenta 5 mg daily, Glipizide 10 mg QAM, Metformin 1000 mg BID  Inpatient Diabetes Program Recommendations Correction (SSI): While inpatient, please consider ordering CBGs with Novolog correction ACHS. HgbA1C: Last A1C was 7.3% on 06/14/14. Please consider ordering an A1C to evalaute glycemic control over the past 2-3 months.  Thanks, Barnie Alderman, RN, MSN, CCRN, CDE Diabetes Coordinator Inpatient Diabetes Program [phone] (Team Pager) [phone] ([facility] office) [phone] [facility] office)

## 2014-11-23 NOTE — Evaluation (Signed)
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  Pt currently limited functionally due to the problems listed. ( See problems list.)   Pt will benefit from PT to maximize function and safety in order to get ready for next venue listed below.     Follow Up Recommendations SNF    Equipment Recommendations  Rolling walker with 5" wheels;3in1 (PT)    Recommendations for Other Services       Precautions / Restrictions Precautions Precautions: Back      Mobility  Bed Mobility Overal bed mobility: Needs Assistance Bed Mobility: Rolling;Sidelying to Sit;Sit to Sidelying Rolling: Min assist Sidelying to sit: Min assist     Sit to sidelying: Min assist General bed mobility comments: educated on safest technique and practiced  Transfers Overall transfer level: Needs assistance Equipment used: Rolling walker (2 wheeled) Transfers: Sit to/from Stand Sit to Stand: Min assist         General transfer comment: cued for hand placement and technique  Ambulation/Gait Ambulation/Gait assistance: Min assist Ambulation Distance (Feet): 15 Feet (x2) Assistive device: Rolling walker (2 wheeled) Gait Pattern/deviations: Step-through pattern Gait velocity: slow   General Gait Details: short slow guarded steps with poor use of RW  Stairs            Wheelchair Mobility    Modified Rankin (Stroke Patients Only)       Balance Overall balance assessment: Needs assistance Sitting-balance support: No upper extremity supported Sitting balance-Leahy Scale: Fair     Standing balance support: Single extremity supported;Bilateral upper  extremity supported Standing balance-Leahy Scale: Poor                               Pertinent Vitals/Pain Pain Assessment: Faces Faces Pain Scale: Hurts whole lot Pain Location: back Pain Descriptors / Indicators: Aching;Grimacing Pain Intervention(s): Limited activity within patient's tolerance;Monitored during session    Home Living Family/patient expects to be discharged to:: Private residence Living Arrangements: Alone Available Help at Discharge: Available PRN/intermittently Type of Home: Mobile home Home Access: Ramped entrance     Home Layout: One level Home Equipment: [place] - single point Additional Comments: Got by independently    Prior Function Level of Independence: Independent               Hand Dominance        Extremity/Trunk Assessment   Upper Extremity Assessment: Defer to OT evaluation           Lower Extremity Assessment: Generalized weakness         Communication   Communication: No difficulties  Cognition Arousal/Alertness: Awake/alert Behavior During Therapy: WFL for tasks assessed/performed Overall Cognitive Status: Within Functional Limits for tasks assessed                      General Comments General comments (skin integrity, edema, etc.): Educated pt and dtr on back care/ advised precautions, lifting restrictions, log roll a dn bed mobility, progression of activity.    Exercises        Assessment/Plan    PT Assessment Patient needs continued PT services  PT Diagnosis Difficulty walking;Generalized weakness;Acute pain  PT Problem List Decreased strength;Decreased activity tolerance;Decreased balance;Decreased mobility;Pain;Decreased knowledge of use of DME  PT Treatment Interventions DME instruction;Functional mobility training;Therapeutic activities;Balance training;Patient/family education;Gait training   PT Goals (Current goals can be found in the Care Plan section) Acute Rehab PT  Goals Patient Stated Goal: get home PT Goal Formulation: With patient Time For Goal Achievement: 11/30/14 Potential to Achieve Goals: Good    Frequency Min 5X/week   Barriers to discharge        Co-evaluation               End of Session   Activity Tolerance: Patient tolerated treatment well Patient left: in bed;with call bell/phone within reach Nurse Communication: Mobility status    Functional Assessment Tool Used: clinical judgement Functional Limitation: Mobility: Walking and moving around Mobility: Walking and Moving Around Current Status (O5929): At least 1 percent but less than 20 percent impaired, limited or restricted Mobility: Walking and Moving Around Discharge Status [phone]): At least 1 percent but less than 20 percent impaired, limited or restricted    Time: 1630-1650 PT Time Calculation (min) (ACUTE ONLY): 20 min   Charges:   PT Evaluation $Initial PT Evaluation Tier I: 1 Procedure     PT G Codes:   PT G-Codes **NOT FOR INPATIENT CLASS** Functional Assessment Tool Used: clinical judgement Functional Limitation: Mobility: Walking and moving around Mobility: Walking and Moving Around Current Status (M6381): At least 1 percent but less than 20 percent impaired, limited or restricted Mobility: Walking and Moving Around Discharge Status [phone]): At least 1 percent but less than 20 percent impaired, limited or restricted    Jeyli Zwicker, Tessie Fass 11/23/2014, 5:01 PM   11/23/2014  Donnella Sham, PT [phone]  (pager)

## 2014-11-24 DIAGNOSIS — M4806 Spinal stenosis, lumbar region: Secondary | ICD-10-CM | POA: Diagnosis not present

## 2014-11-24 LAB — GLUCOSE, CAPILLARY
Glucose-Capillary: 152 mg/dL — ABNORMAL HIGH (ref 70–99)
Glucose-Capillary: 148 mg/dL — ABNORMAL HIGH (ref 70–99)

## 2014-11-24 MED ORDER — HYDROCODONE-ACETAMINOPHEN 10-325 MG PO TABS: 1.0000 | ORAL_TABLET | Freq: Four times a day (QID) | ORAL | Status: DC | PRN

## 2014-11-24 NOTE — Progress Notes (Signed)
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 Pt with improved functional mobility with direct assist but cognition continues to impair safety and function. Will continue to follow and recommend SNF. Discussed with RN and requested conversation with dgtr regarding prior cognition.   Follow Up Recommendations  SNF     Equipment Recommendations       Recommendations for Other Services       Precautions / Restrictions Precautions Precautions: Back Precaution Booklet Issued: Yes (comment) Precaution Comments: Pt unable to recall precautions even within session with handout directly in front of him despite education repeatedly during session    Mobility  Bed Mobility Overal bed mobility: Needs Assistance Bed Mobility: Rolling;Sidelying to Sit Rolling: Min assist Sidelying to sit: Min assist       General bed mobility comments: VC's and tactile cues for sequencing and safety. Min A to help pt perform mobility.   Transfers Overall transfer level: Needs assistance   Transfers: Sit to/from Stand Sit to Stand: Min assist         General transfer comment: cued for hand placement and technique with max cues for initiation  Ambulation/Gait Ambulation/Gait assistance: Min assist Ambulation Distance (Feet): 150 Feet Assistive device: Rolling walker (2 wheeled) Gait Pattern/deviations: Step-through pattern;Decreased stride length;Trunk flexed   Gait velocity interpretation: Below normal speed for age/gender General Gait Details: max constant multimodal cues for posture, position  in RW, looking up and directional cues. Pt required cues every 3 sec for use of RW and gait, no evidence of carry over   Stairs            Wheelchair Mobility    Modified Rankin (Stroke Patients Only)       Balance Overall balance assessment: Needs assistance   Sitting balance-Leahy Scale: Good       Standing balance-Leahy Scale: Fair                      Cognition Arousal/Alertness: Awake/alert Behavior During Therapy: Flat affect Overall Cognitive Status: Impaired/Different from baseline Area of Impairment: Attention;Memory;Following commands;Safety/judgement;Problem solving;Orientation Orientation Level: Disoriented to;Time;Situation Current Attention Level: Sustained Memory: Decreased recall of precautions;Decreased short-term memory Following Commands: Follows one step commands with increased time;Follows one step commands inconsistently Safety/Judgement: Decreased awareness of safety;Decreased awareness of deficits   Problem Solving: Slow processing;Decreased initiation;Difficulty sequencing;Requires tactile cues;Requires verbal cues General Comments: Pt unaware of month, day and year ("1966") even after education for orientation within session. Pt requires constant cues for precautions and mobility. No family present to confirm PLOF but markedly different for someone who was living alone    Exercises      General Comments        Pertinent Vitals/Pain Pain Assessment: No/denies pain    Home Living                      Prior Function            PT Goals (current goals can now be found in the care plan section) Progress towards PT goals: Progressing toward goals    Frequency  PT Plan Current plan remains appropriate    Co-evaluation             End of Session Equipment Utilized During Treatment: Gait belt Activity Tolerance: Patient tolerated treatment well Patient left: in chair;with call bell/phone within reach      Time: 0758-0822 PT Time Calculation (min) (ACUTE ONLY): 24 min  Charges:  $Gait Training: 8-22 mins $Therapeutic Activity: 8-22 mins                    G Codes:      Melford Aase Dec 21, 2014, 9:12 AM Elwyn Reach, [place]

## 2014-11-24 NOTE — Progress Notes (Signed)
Patient ID: Christian [name], male   DOB: 1935/09/27, 80 y.o.   MRN: [id number] Patient doing better improved leg pain back pain well-controlled.  Awake alert somewhat confused but appears to be neurologically at his baseline  Continue to mobilize physical outpatient therapy will work on SNF placement

## 2014-11-24 NOTE — Progress Notes (Signed)
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 Pt limited by increased confusion and fatigue, requiring +2 assistance for safety and multimodal cues for sequencing. Pt required mod assist to transfer into car and had difficulty following simple commands. Pt d/c at this time to SNF. No further acute OT needs.    Follow Up Recommendations  SNF;Supervision/Assistance - 24 hour    Equipment Recommendations       Recommendations for Other Services      Precautions / Restrictions Precautions Precautions: Back Precaution Comments: Pt uanble to recall precautions       Mobility Bed Mobility               General bed mobility comments: NT  Transfers Overall transfer level: Needs assistance Equipment used: Rolling walker (2 wheeled) Transfers: Sit to/from [employer] Transfers Sit to Stand: Min assist;+2 safety/equipment Stand pivot transfers: Mod assist;+2 safety/equipment       General transfer comment: Multimodal cues for sequencing. Pt frequently forgot commands and stated desire to do other tasks. +2 for safety due to confusion. Pt required mod assistance to transfer into car and to scoot hips back due to difficulty following commands.         ADL Overall ADL's : Needs assistance/impaired                                       General ADL Comments: Assisted pt, family, and NT with car transfer. Pt required +2 for safety with multimodal cues. Pt unable to follow simple commands. Assisted pt in transfer to w/c then into car. Explained to family that SNF staff would assist with transfer out of car and family  should NOT attempt. Family is driving straight to SNF.                 Cognition  Arousal/Alertness: Awake/Alert Behavior During Therapy: Flat affect Overall Cognitive Status: Impaired/Different from baseline Area of Impairment: Orientation;Attention;Memory;Following commands;Safety/judgement;Awareness;Problem solving Orientation Level: Disoriented to;Time;Situation Current Attention Level: Sustained Memory: Decreased recall of precautions;Decreased short-term memory  Following Commands: Follows one step commands with increased time Safety/Judgement: Decreased awareness of safety;Decreased awareness of deficits Awareness: Emergent Problem Solving: Slow processing;Decreased initiation;Difficulty sequencing;Requires tactile cues;Requires verbal cues General Comments: Pt with decreased orientation and attention from this AM session. Possibly due to medications.                 Pertinent Vitals/ Pain       Pain Assessment: Faces Faces Pain Scale: Hurts little more Pain Location: back Pain Descriptors / Indicators: Grimacing Pain Intervention(s): Limited activity within patient's tolerance;Monitored during session;Repositioned         Frequency       Progress Toward Goals  OT Goals(current goals can now be found in the care plan section)  Progress towards OT goals: Goals met/education completed, patient discharged from [place] All goals met and education completed, patient discharged from OT services       End of Session Equipment Utilized During Treatment: Gait belt;Rolling walker   Activity Tolerance Patient limited by fatigue   Patient  Left Other (comment) (in car with family, transferring to SNF)   Nurse Communication      Functional Assessment Tool Used: clinical judgement Functional Limitation: Self care Self Care Current Status [phone]): At least 40 percent but less than 60 percent impaired, limited or restricted Self Care Goal Status (L4650): At  least 1 percent but less than 20 percent impaired, limited or restricted Self Care Discharge Status [phone]): At least 40 percent but less than 60 percent impaired, limited or restricted   Time: 1735-1755 OT Time Calculation (min): 20 min  Charges: OT G-codes **NOT FOR INPATIENT CLASS** Functional Assessment Tool Used: clinical judgement Functional Limitation: Self care Self Care Current Status (K8127): At least 40 percent but less than 60 percent impaired, limited or restricted Self Care Goal Status (N1700): At least 1 percent but less than 20 percent impaired, limited or restricted Self Care Discharge Status [phone]): At least 40 percent but less than 60 percent impaired, limited or restricted OT General Charges $OT Visit: 1 Procedure OT Treatments $Self Care/Home Management : 8-22 mins  Juluis Rainier 11/24/2014, 6:19 PM   Secundino Ginger Lynetta Mare, OTR/L Occupational Therapist [phone] (pager)

## 2014-11-24 NOTE — Clinical Social Work Note (Signed)
Message left with daughter. Patient agreeable to SNF placement but seems somewhat confused. CSW will go ahead and start SNF search so that patient can be placed today.   Liz Beach MSW, [place], [place], [phone]

## 2014-11-24 NOTE — Progress Notes (Signed)
Report called to Hassan Rowan at [facility] @ 1655. Pt's daughter given D/C instructions with Rx in the packet for SNF. Pt's incision is covered with Honeycomb dressing and has no sign of infection. Pt's IV was removed prior to D/C. Pt D/C'd to SNF @ 1735 with family transporting. Pt is stable @ D/C. Holli Humbles, RN

## 2014-11-24 NOTE — Discharge Summary (Signed)
Physician Discharge Summary  Patient ID: Christian [name] MRN: [id number] DOB/AGE: Jul 01, 1935 80 y.o.  Admit date: 11/22/2014 Discharge date: 11/24/2014  Admission Diagnoses:  Discharge Diagnoses:  Active Problems:   S/P lumbar laminectomy   Discharged Condition: good  Hospital Course: Surgery 2 days ago for lumbar decompression. Did fairly well with pain issues, but had some confusion that resolved. He was very slow to increase activity, and needed a great deal of assistance. It was felt that he would benefit from a short stay at a SNF, and this was arranged for him. He was released to a SNF on pod 2.  Consults: None  Significant Diagnostic Studies: none  Treatments: surgery: L 34 decompression  Discharge Exam: Blood pressure 129/50, pulse 95, temperature 98.6 F (37 C), temperature source Oral, resp. rate 16, height 5\' 8"  (1.727 m), weight 97.614 kg (215 lb 3.2 oz), SpO2 100 %. Incision/Wound:clean and dry; no new neuro issues  Disposition:SNF     Medication List    ASK your doctor about these medications        ACCU-CHEK AVIVA PLUS test strip  Generic drug:  glucose blood  USE ONE STRIP TO CHECK GLUCOSE TWICE DAILY     acetaminophen 500 MG tablet  Commonly known as:  TYLENOL  Take 1,000 mg by mouth every 8 (eight) hours as needed for mild pain or moderate pain (back pain).     aspirin EC 81 MG tablet  Take 81 mg by mouth daily.     aspirin-sod bicarb-citric acid 325 MG Tbef tablet  Commonly known as:  ALKA-SELTZER  Take 325 mg by mouth daily as needed (morning congestion).     B-12 PO  Take 1 tablet by mouth daily.     cholecalciferol 1000 UNITS tablet  Commonly known as:  VITAMIN D  Take 1,000 Units by mouth daily.     fish oil-omega-3 fatty acids 1000 MG capsule  Take 3 g by mouth 2 (two) times daily.     gabapentin 600 MG tablet  Commonly known as:  NEURONTIN  TAKE ONE TABLET BY MOUTH THREE TIMES DAILY     glipiZIDE 10 MG tablet  Commonly known  as:  GLUCOTROL  TAKE ONE TABLET BY MOUTH TWICE DAILY BEFORE  A  MEAL     HYDROcodone-acetaminophen 10-325 MG per tablet  Commonly known as:  NORCO  Take 1 tablet by mouth 3 (three) times daily as needed (per Dr Nelva Bush).     JANUVIA 100 MG tablet  Generic drug:  sitaGLIPtin  TAKE ONE TABLET BY MOUTH ONCE DAILY     lisinopril 10 MG tablet  Commonly known as:  PRINIVIL,ZESTRIL  TAKE ONE TABLET BY MOUTH ONCE DAILY     metFORMIN 1000 MG tablet  Commonly known as:  GLUCOPHAGE  TAKE ONE TABLET BY MOUTH TWICE DAILY WITH MEALS     multivitamin with minerals Tabs tablet  Take 1 tablet by mouth daily.     niacin 500 MG CR tablet  Commonly known as:  NIASPAN  TAKE ONE TABLET BY MOUTH AT BEDTIME     potassium gluconate 595 MG Tabs tablet  Take 595 mg by mouth daily.     simvastatin 40 MG tablet  Commonly known as:  ZOCOR  TAKE ONE TABLET BY MOUTH ONCE DAILY AT BEDTIME         At home rest most of the time. Get up 9 or 10 times each day and take a 15 or 20 minute walk. No riding  in the car and to your first postoperative appointment. If you have neck surgery you may shower from the chest down starting on the third postoperative day. If you had back surgery he may start showering on the third postoperative day with saran wrap wrapped around your incisional area 3 times. After the shower remove the saran wrap. Take pain medicine as needed and other medications as instructed. Call my office for an appointment.  SignedFaythe Ghee, MD 11/24/2014, 1:07 PM

## 2014-11-24 NOTE — Progress Notes (Signed)
Occupational Therapy Treatment Patient Details Name: NYAIRE DENBLEYKER MRN: [id number] DOB: 1935-08-14 Today's Date: 11/24/2014    History of present illness pt admitted with stenosis at L34, s/p Decompressive lumbar laminectomy, medial facetectomy, foraminotomy at L3-4 bilaterally for central canal and nerve root decompression   OT comments  Pt seen today for functional mobility and ADLs. Pt continues ot be limited by pain (although he denies pain) and decreased ROM. Pt shaking and reports he is cold, but also feel that generalized weakness is contributing. Pt was oriented during OT session, however note that he was not Oriented during PT session this AM. Continue to feel that SNF would be safest d/c venue. Acute OT will continue to follow per POC.    Follow Up Recommendations  SNF;Supervision/Assistance - 24 hour    Equipment Recommendations  3 in 1 bedside comode    Recommendations for Other Services      Precautions / Restrictions Precautions Precautions: Back Precaution Booklet Issued: Yes (comment) Precaution Comments: Pt able to recall 1/3 back precautions (twisting) independently. Used handout to review back precautions and ways to incorporate into ADLs.  Restrictions Weight Bearing Restrictions: No       Mobility Bed Mobility Overal bed mobility: Needs Assistance Bed Mobility: Rolling;Sidelying to Sit;Sit to Sidelying Rolling: Min assist Sidelying to sit: Min assist     Sit to sidelying: Min assist General bed mobility comments: VC's and tactile cues for sequencing and safety. Min A to help pt perform mobility.   Transfers Overall transfer level: Needs assistance Equipment used: Rolling walker (2 wheeled) Transfers: Sit to/from Stand Sit to Stand: Min assist         General transfer comment: VC"s for hand placement and Min A to power up from bed. Multiple VC's for pt not to pull on RW. Tactile cues to move hands to bed.     Balance Overall balance  assessment: Needs assistance   Sitting balance-Leahy Scale: Good       Standing balance-Leahy Scale: Fair                     ADL Overall ADL's : Needs assistance/impaired     Grooming: Min guard;Standing;Wash/dry hands               Lower Body Dressing: Maximal assistance;Sit to/from stand Lower Body Dressing Details (indicate cue type and reason): pt is unable to reach LEs through figure four method. Reinforced no bending to reach LEs for LB ADLs. 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  Toilet Transfer: Minimal assistance;Ambulation;RW           Functional mobility during ADLs: Minimal assistance;Rolling walker General ADL Comments: Pt is progressing slowly and denies pain today, however grimacing with movement. Pt is oriented during OT session, but note that he was not oriented during PT this morning.                 Cognition  Arousal/Alertness: Awake/Alert Behavior During Therapy: Flat affect Overall Cognitive Status: Impaired/Different from baseline Area of Impairment: Attention;Memory;Safety/judgement;Problem solving Orientation Level: Disoriented to;Time;Situation Current Attention Level: Sustained Memory: Decreased recall of precautions;Decreased short-term memory  Following Commands: Follows one step commands with increased time Safety/Judgement: Decreased awareness of safety;Decreased awareness of deficits   Problem Solving: Slow processing;Decreased initiation;Difficulty sequencing;Requires tactile cues;Requires verbal cues General Comments: Pt oriented to time, however note that this is different from PT session this morning. Pt continues to demonstrate decreased awareness of safety,  attention, memory, and problem solving.                  Pertinent Vitals/ Pain       Pain Assessment: Faces (pt verablly declined pain, but grimacing) Faces Pain Scale: Hurts even more Pain Location: back Pain  Descriptors / Indicators: Grimacing Pain Intervention(s): Limited activity within patient's tolerance;Monitored during session;Repositioned         Frequency Min 2X/week     Progress Toward Goals  OT Goals(current goals can now be found in the care plan section)  Progress towards OT goals: Progressing toward goals  Acute Rehab OT Goals Patient Stated Goal: get home OT Goal Formulation: With patient Time For Goal Achievement: 12/07/14 Potential to Achieve Goals: Good  Plan Discharge plan remains appropriate       End of Session Equipment Utilized During Treatment: Gait belt;Rolling walker   Activity Tolerance Patient limited by fatigue   Patient Left in bed;with call bell/phone within reach;with family/visitor present   Nurse Communication      Functional Assessment Tool Used: clinical judgement Functional Limitation: Self care Self Care Current Status (V0131): At least 40 percent but less than 60 percent impaired, limited or restricted Self Care Goal Status (Y3888): At least 1 percent but less than 20 percent impaired, limited or restricted   Time: 7579-7282 OT Time Calculation (min): 24 min  Charges: OT G-codes **NOT FOR INPATIENT CLASS** Functional Assessment Tool Used: clinical judgement Functional Limitation: Self care Self Care Current Status (S6015): At least 40 percent but less than 60 percent impaired, limited or restricted Self Care Goal Status (I1537): At least 1 percent but less than 20 percent impaired, limited or restricted OT General Charges $OT Visit: 1 Procedure OT Treatments $Self Care/Home Management : 23-37 mins  Juluis Rainier 11/24/2014, 11:41 AM   Cyndie Chime, OTR/L Occupational Therapist [phone] (pager)

## 2014-11-24 NOTE — Discharge Instructions (Signed)

## 2014-11-24 NOTE — Clinical Social Work Placement (Signed)
Clinical Social Work Department CLINICAL SOCIAL WORK PLACEMENT NOTE 11/24/2014  Patient:  Christian [name], Christian [name]  Account Number:  [id number] Admit date:  11/22/2014  Clinical Social Worker:  Kemper Durie, [place]  Date/time:  11/24/2014 01:37 PM  Clinical Social Work is seeking post-discharge placement for this patient at the following level of care:   [place]   (*CSW will update this form in Epic as items are completed)   11/24/2014  Patient/family provided with [facility] Department of Clinical Social Work's list of facilities offering this level of care within the geographic area requested by the patient (or if unable, by the patient's family).  11/24/2014  Patient/family informed of their freedom to choose among providers that offer the needed level of care, that participate in Medicare, Medicaid or managed care program needed by the patient, have an available bed and are willing to accept the patient.  11/24/2014  Patient/family informed of [facility]' ownership interest in [facility], as well as of the fact that they are under no obligation to receive care at this facility.  PASARR submitted to EDS on 11/24/2014 PASARR number received on 11/24/2014  FL2 transmitted to all facilities in geographic area requested by pt/family on  11/24/2014 FL2 transmitted to all facilities within larger geographic area on   Patient informed that his/her managed care company has contracts with or will negotiate with  certain facilities, including the following:     Patient/family informed of bed offers received:  11/24/2014 Patient chooses bed at [facility] Physician recommends and patient chooses bed at    Patient to be transferred to [facility] on  11/24/2014 Patient to be transferred to facility by Family will transport Patient and family notified of transfer on 11/24/2014 Name of family member notified:  Thedore Mins  The following physician request were  entered in Epic:   Additional Comments:    Per MD patient ready for DC to [facility]. RN, patient, patient's family, and facility notified of DC. RN given number for report. DC packet on chart. Family wishes to transport (patient cannot DC until auth received, RN aware of this). CSW signing off.    Liz Beach MSW, [place], [place], [phone]

## 2014-11-27 ENCOUNTER — Encounter: Payer: Self-pay | Admitting: Internal Medicine

## 2014-11-27 ENCOUNTER — Non-Acute Institutional Stay (SKILLED_NURSING_FACILITY): Payer: PPO | Admitting: Internal Medicine

## 2014-11-27 DIAGNOSIS — Z9889 Other specified postprocedural states: Secondary | ICD-10-CM

## 2014-11-27 DIAGNOSIS — E7800 Pure hypercholesterolemia, unspecified: Secondary | ICD-10-CM

## 2014-11-27 DIAGNOSIS — E1165 Type 2 diabetes mellitus with hyperglycemia: Secondary | ICD-10-CM

## 2014-11-27 DIAGNOSIS — D62 Acute posthemorrhagic anemia: Secondary | ICD-10-CM

## 2014-11-27 DIAGNOSIS — E1142 Type 2 diabetes mellitus with diabetic polyneuropathy: Secondary | ICD-10-CM

## 2014-11-27 DIAGNOSIS — K5900 Constipation, unspecified: Secondary | ICD-10-CM

## 2014-11-27 DIAGNOSIS — R2681 Unsteadiness on feet: Secondary | ICD-10-CM

## 2014-11-27 DIAGNOSIS — I10 Essential (primary) hypertension: Secondary | ICD-10-CM

## 2014-11-27 NOTE — Progress Notes (Signed)
Patient ID: NAS WAFER, male   DOB: 08/04/1935, 80 y.o.   MRN: [id number]     Facility: [facility]    PCP: Karis Juba, PA-C  Code Status: full code  No Known Allergies  Chief Complaint  Patient presents with  . New Admit To SNF     HPI:  80 year old patient is here for short term rehabilitation post hospital admission from 11/22/14-11/24/14 for lumbar spinal stenosis where he underwent lumbar L3-4 decompression. He is seen in his room today. He mentions that his pain is not under control at present. Denies muscle spasm. Has numbness and tingling in his legs and feet which is chronic for him. He has not had a bowel movement since admission to the hospital. Appetite is good. Denies nausea, vomiting or abdominal pain.  Has easy fatigue. He has PMH of HTN, DM, neuropathy  Review of Systems:  Constitutional: Negative for fever, chills, diaphoresis.  HENT: Negative for headache, congestion, nasal discharge Eyes: Negative for eye pain, blurred vision, double vision and discharge.  Respiratory: Negative for cough, shortness of breath and wheezing.   Cardiovascular: Negative for chest pain, palpitations, leg swelling.  Gastrointestinal: Negative for heartburn, nausea, vomiting, abdominal pain, melena, rectal bleed, diarrhea Genitourinary: Negative for dysuria, flank pain.  Musculoskeletal: Negative for back pain, falls Skin: Negative for itching, rash.  Neurological: Negative for dizziness, focal weakness Psychiatric/Behavioral: Negative for depression, anxiety   Past Medical History  Diagnosis Date  . Hypercholesterolemia   . Diabetic neuropathy   . Skin cancer of arm 04/20/2013    left upper arm  . Spinal stenosis 05/17/2014    L3-L4   . 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 HTN (hypertension) 03/03/2013  . Diabetes mellitus     Type 2  . History of kidney stones   . Urgency of urination   . Arthritis    Past Surgical History  Procedure  Laterality Date  . Skin cancer excision Left     shoulder and hand  . Lumbar laminectomy/decompression microdiscectomy Bilateral 11/22/2014    Procedure: Laminectomy and foraminotomy lumbar three-lumbar four bilateral ;  Surgeon: Eustace Moore, MD;  Location: [place] [facility];  Service: Neurosurgery;  Laterality: Bilateral;   Social History:   reports that he quit smoking about 11 years ago. He has never used smokeless tobacco. He reports that he does not drink alcohol or use illicit drugs.  History reviewed. No pertinent family history.  Medications: Patient's Medications  New Prescriptions   No medications on file  Previous Medications   ACCU-CHEK AVIVA PLUS TEST STRIP    USE ONE STRIP TO CHECK GLUCOSE TWICE DAILY   ACETAMINOPHEN (TYLENOL) 500 MG TABLET    Take 1,000 mg by mouth every 8 (eight) hours as needed for mild pain or moderate pain (back pain).   ASPIRIN EC 81 MG TABLET    Take 81 mg by mouth daily.     ASPIRIN-SOD BICARB-CITRIC ACID (ALKA-SELTZER) 325 MG TBEF TABLET    Take 325 mg by mouth daily as needed (morning congestion).   CHOLECALCIFEROL (VITAMIN D) 1000 UNITS TABLET    Take 1,000 Units by mouth daily.     CYANOCOBALAMIN (B-12 PO)    Take 1 tablet by mouth daily.   FISH OIL-OMEGA-3 FATTY ACIDS 1000 MG CAPSULE    Take 3 g by mouth 2 (two) times daily.     GABAPENTIN (NEURONTIN) 600 MG TABLET    TAKE ONE TABLET BY MOUTH THREE TIMES DAILY  GLIPIZIDE (GLUCOTROL) 10 MG TABLET    TAKE ONE TABLET BY MOUTH TWICE DAILY BEFORE  A  MEAL   HYDROCODONE-ACETAMINOPHEN (NORCO) 10-325 MG PER TABLET    Take 1-2 tablets by mouth every 6 (six) hours as needed for moderate pain (per Dr Nelva Bush).   JANUVIA 100 MG TABLET    TAKE ONE TABLET BY MOUTH ONCE DAILY   LISINOPRIL (PRINIVIL,ZESTRIL) 10 MG TABLET    TAKE ONE TABLET BY MOUTH ONCE DAILY   METFORMIN (GLUCOPHAGE) 1000 MG TABLET    TAKE ONE TABLET BY MOUTH TWICE DAILY WITH MEALS   MULTIPLE VITAMIN (MULITIVITAMIN WITH MINERALS) TABS    Take 1  tablet by mouth daily.     NIACIN (NIASPAN) 500 MG CR TABLET    TAKE ONE TABLET BY MOUTH AT BEDTIME   POTASSIUM GLUCONATE 595 MG TABS TABLET    Take 595 mg by mouth daily.   SIMVASTATIN (ZOCOR) 40 MG TABLET    TAKE ONE TABLET BY MOUTH ONCE DAILY AT BEDTIME  Modified Medications   No medications on file  Discontinued Medications   No medications on file     Physical Exam: Filed Vitals:   11/27/14 1309  BP: 102/55  Pulse: 79  Temp: 97 F (36.1 C)  Resp: 18  SpO2: 98%    General- elderly male, overweight, in no acute distress Head- normocephalic, atraumatic Throat- moist mucus membrane Eyes- PERRLA, EOMI, no pallor, no icterus, no discharge, normal conjunctiva, normal sclera, wears glasses Neck- no cervical lymphadenopathy Cardiovascular- normal s1,s2, no murmurs, palpable dorsalis pedis and radial pulses, no leg edema Respiratory- bilateral clear to auscultation, no wheeze, no rhonchi, no crackles, no use of accessory muscles Abdomen- bowel sounds present, soft, non tender, distended, no guarding or rigidity Musculoskeletal- able to move all 4 extremities, lower extremity weakness noted, no paraspinal tenderness Neurological- no focal deficit Skin- warm and dry, honey comb dressing on lumbar surgical incision, some blood stain at one dressing site but otherwise clean and dry Psychiatry- alert and oriented to person, place and time, normal mood and affect    Labs reviewed: Basic Metabolic Panel:  Recent Labs  12/07/13 0859 06/14/14 0947 11/15/14 1133  NA 142 140 140  K 4.6 4.3 4.5  CL 105 107 106  CO2 25 25 22   GLUCOSE 172* 168* 230*  BUN 21 13 22   CREATININE 0.94 0.99 1.13  CALCIUM 9.6 9.4 9.6   Liver Function Tests:  Recent Labs  12/07/13 0859 06/14/14 0947  AST 15 20  ALT 16 26  ALKPHOS 53 47  BILITOT 0.5 0.4  PROT 6.6 6.5  ALBUMIN 4.4 4.2   No results for input(s): LIPASE, AMYLASE in the last 8760 hours. No results for input(s): AMMONIA in the last  8760 hours. CBC:  Recent Labs  11/15/14 1133  WBC 7.9  NEUTROABS 4.7  HGB 11.5*  HCT 34.6*  MCV 86.3  PLT 196   Cardiac Enzymes: No results for input(s): CKTOTAL, CKMB, CKMBINDEX, TROPONINI in the last 8760 hours. BNP: Invalid input(s): POCBNP CBG:  Recent Labs  11/23/14 2133 11/24/14 0850 11/24/14 1256  GLUCAP 143* 152* 148*    Assessment/Plan  Gait instability Will have him work with physical therapy and occupational therapy team to help with gait training and muscle strengthening exercises.fall precautions. Skin care. Encourage to be out of bed.   Lumbar spinal stenosis S/p decompression. Continue therapy. On norco 10/325 tid prn for pain and pain not under control. Will have him on norco 10/325 q6h  prn for pain for now and reassess. Has f/u with orthopedics  Diabetic neuropathy Continue neurontin 600 mg po tid  Diabetes Continue home regimen glucotrol 10 mg id, januvia 100 mg daily and glucophage 1000 mg bid, monitor cbg  HTN bp on softer side of normal in facility, decrease lisinopril to 5 mg daily for now to avoid hypotension and falls. Also monitor bp to adjust dosing further as needed  Anemia Likely post op from blood loss, monitor with recheck next lab  HLD Continue fish oil- omega 3 supplement with zocor 40 mg daily  Constipation Add miralax 17 g daily and colace 100 mg bid for now and reassess   Goals of care: short term rehabilitation   Labs/tests ordered: cbc next lab  Family/ staff Communication: reviewed care plan with patient and nursing supervisor    Blanchie Serve, MD  [place] [phone] (Monday-Friday 8 am - 5 pm) [phone] (afterhours)

## 2014-11-29 LAB — CBC AND DIFFERENTIAL
HCT: 30 % — AB (ref 41–53)
Platelets: 306 10*3/uL (ref 150–399)
WBC: 10.1 10^3/mL

## 2014-12-11 ENCOUNTER — Encounter: Payer: Self-pay | Admitting: Registered Nurse

## 2014-12-11 ENCOUNTER — Non-Acute Institutional Stay (SKILLED_NURSING_FACILITY): Payer: PPO | Admitting: Registered Nurse

## 2014-12-11 DIAGNOSIS — Z9889 Other specified postprocedural states: Secondary | ICD-10-CM

## 2014-12-11 DIAGNOSIS — E7800 Pure hypercholesterolemia, unspecified: Secondary | ICD-10-CM

## 2014-12-11 DIAGNOSIS — L853 Xerosis cutis: Secondary | ICD-10-CM

## 2014-12-11 DIAGNOSIS — E1142 Type 2 diabetes mellitus with diabetic polyneuropathy: Secondary | ICD-10-CM | POA: Diagnosis not present

## 2014-12-11 DIAGNOSIS — D62 Acute posthemorrhagic anemia: Secondary | ICD-10-CM | POA: Diagnosis not present

## 2014-12-11 DIAGNOSIS — R2681 Unsteadiness on feet: Secondary | ICD-10-CM | POA: Diagnosis not present

## 2014-12-11 DIAGNOSIS — I10 Essential (primary) hypertension: Secondary | ICD-10-CM

## 2014-12-11 DIAGNOSIS — K5900 Constipation, unspecified: Secondary | ICD-10-CM | POA: Diagnosis not present

## 2014-12-11 LAB — CBC AND DIFFERENTIAL: Hemoglobin: 10 g/dL — AB (ref 13.5–17.5)

## 2014-12-11 NOTE — Progress Notes (Signed)
Patient ID: Christian [name], male   DOB: 1935-03-24, 80 y.o.   MRN: [id number]   Place of Service: [facility]  No Known Allergies  Code Status: Full Code  Goals of Care: Longevity/STR  Chief Complaint  Patient presents with  . Discharge Note    HPI 80 y.o. male with PMH of DM2 with peripheral neuropathy, HLD, HTN, spinal stenosis among other is being seen for a discharge visit. Patient was here for short-term rehabilitation post hospital admission from 11/22/14 to 11/24/14 for lumbar L3-4 decompression. Patient has worked with therapy team and is ready to be discharged home with [facility] PT/OT. Seen in room today. Denies any concerns  Review of Systems Constitutional: Negative for fever and chills HENT: Negative for ear pain, congestion, and sore throat Eyes: Negative for eye pain and eye discharge Cardiovascular: Negative for chest pain, palpitations, and leg swelling Respiratory: Negative cough, shortness of breath, and wheezing.  Gastrointestinal: Negative for nausea and vomiting. Negative for abdominal pain, diarrhea and constipation.  Genitourinary: Negative for  dysuria Endocrine: Negative for polydipsia, polyphagia, and polyuria Musculoskeletal: Positive for back pain.  Neurological: Negative for dizziness and headache.  Skin: Negative for rash and itchiness Psychiatric: Negative for depression.   Past Medical History  Diagnosis Date  . Hypercholesterolemia   . Diabetic neuropathy   . Skin cancer of arm 04/20/2013    left upper arm  . Spinal stenosis 05/17/2014    L3-L4   . 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 HTN (hypertension) 03/03/2013  . Diabetes mellitus     Type 2  . History of kidney stones   . Urgency of urination   . Arthritis     Past Surgical History  Procedure Laterality Date  . Skin cancer excision Left     shoulder and hand  . Lumbar laminectomy/decompression microdiscectomy Bilateral 11/22/2014    Procedure: Laminectomy and foraminotomy  lumbar three-lumbar four bilateral ;  Surgeon: Eustace Moore, MD;  Location: [place] [facility];  Service: Neurosurgery;  Laterality: Bilateral;    History   Social History  . Marital Status: Divorced    Spouse Name: N/A  . Number of Children: N/A  . Years of Education: N/A   Occupational History  . Not on file.   Social History Main Topics  . Smoking status: Former Smoker    Quit date: 03/04/2003  . Smokeless tobacco: Never Used  . Alcohol Use: No  . Drug Use: No  . Sexual Activity: Not on file   Other Topics Concern  . Not on file   Social History Narrative    No family history on file.    Medication List       This list is accurate as of: 12/11/14  4:25 PM.  Always use your most recent med list.               ACCU-CHEK AVIVA PLUS test strip  Generic drug:  glucose blood  USE ONE STRIP TO CHECK GLUCOSE TWICE DAILY     acetaminophen 500 MG tablet  Commonly known as:  TYLENOL  Take 1,000 mg by mouth every 8 (eight) hours as needed for mild pain or moderate pain (back pain).     aspirin EC 81 MG tablet  Take 81 mg by mouth daily.     aspirin-sod bicarb-citric acid 325 MG Tbef tablet  Commonly known as:  ALKA-SELTZER  Take 325 mg by mouth daily as needed (morning congestion).     B-12 PO  Take 1 tablet by mouth daily.     cholecalciferol 1000 UNITS tablet  Commonly known as:  VITAMIN D  Take 1,000 Units by mouth daily.     fish oil-omega-3 fatty acids 1000 MG capsule  Take 3 g by mouth 2 (two) times daily.     gabapentin 600 MG tablet  Commonly known as:  NEURONTIN  TAKE ONE TABLET BY MOUTH THREE TIMES DAILY     glipiZIDE 10 MG tablet  Commonly known as:  GLUCOTROL  TAKE ONE TABLET BY MOUTH TWICE DAILY BEFORE  A  MEAL     HYDROcodone-acetaminophen 10-325 MG per tablet  Commonly known as:  NORCO  Take 1 tablet by mouth every 4 (four) hours as needed.     JANUVIA 100 MG tablet  Generic drug:  sitaGLIPtin  TAKE ONE TABLET BY MOUTH ONCE DAILY      lisinopril 10 MG tablet  Commonly known as:  PRINIVIL,ZESTRIL  TAKE ONE TABLET BY MOUTH ONCE DAILY     metFORMIN 1000 MG tablet  Commonly known as:  GLUCOPHAGE  TAKE ONE TABLET BY MOUTH TWICE DAILY WITH MEALS     multivitamin with minerals Tabs tablet  Take 1 tablet by mouth daily.     niacin 500 MG CR tablet  Commonly known as:  NIASPAN  TAKE ONE TABLET BY MOUTH AT BEDTIME     potassium gluconate 595 MG Tabs tablet  Take 595 mg by mouth daily.     simvastatin 40 MG tablet  Commonly known as:  ZOCOR  TAKE ONE TABLET BY MOUTH ONCE DAILY AT BEDTIME        Physical Exam  BP 143/69 mmHg  Pulse 93  Temp(Src) 98.6 F (37 C)  Resp 18  Ht 5\' 8"  (1.727 m)  Wt 204 lb (92.534 kg)  BMI 31.03 kg/m2  Constitutional: WDWN elderly male in no acute distress. Conversant and pleasant HEENT: Normocephalic and atraumatic. PERRL. EOM intact. No icterus. External auditory canals patent, auricles without lesions. No nasal discharge or sinus tenderness. Oral mucosa moist. Posterior pharynx clear of any exudate or lesions.  Neck: Supple and nontender. No lymphadenopathy, masses, or thyromegaly. No JVD or carotid bruits. Cardiac: Normal S1, S2. RRR without appreciable murmurs, rubs, or gallops. Distal pulses intact. Trace pitting edema of BLE Lungs: No respiratory distress. Unlabored respirations. Breath sounds clear bilaterally without rales, rhonchi, or wheezes. Abdomen: Audible bowel sounds in all quadrants. Round, appears distended, but soft and nontender   Musculoskeletal: able to move all extremities. Ambulates with cane.  No joint erythema or tenderness.  Skin: Warm and very dry/flaky. No rash noted. Lumbar surgical incision w/o signs of infection Neurological: Alert and oriented to person, place, and time. No focal deficits.  Psychiatric: Judgment and insight adequate. Appropriate mood and affect.   Labs Reviewed  CBC Latest Ref Rng 12/11/2014 11/29/2014 11/15/2014  WBC - - 10.1 7.9    Hemoglobin 13.5 - 17.5 g/dL 10.0(A) - 11.5(L)  Hematocrit 41 - 53 % - 30(A) 34.6(L)  Platelets 150 - 399 K/L - 306 196    CMP Latest Ref Rng 11/15/2014 06/14/2014 12/07/2013  Glucose 70 - 99 mg/dL 230(H) 168(H) 172(H)  BUN 6 - 23 mg/dL 22 13 21   Creatinine 0.50 - 1.35 mg/dL 1.13 0.99 0.94  Sodium 135 - 145 mEq/L 140 140 142  Potassium 3.5 - 5.3 mEq/L 4.5 4.3 4.6  Chloride 96 - 112 mEq/L 106 107 105  CO2 19 - 32 mEq/L 22 25 25   Calcium  8.4 - 10.5 mg/dL 9.6 9.4 9.6  Total Protein 6.0 - 8.3 g/dL - 6.5 6.6  Total Bilirubin 0.2 - 1.2 mg/dL - 0.4 0.5  Alkaline Phos 39 - 117 U/L - 47 53  AST 0 - 37 U/L - 20 15  ALT 0 - 53 U/L - 26 16    Assessment & Plan 1. S/P lumbar laminectomy Pain is adequately controlled on current regimen. Continue norco 10/325mg  every four as needed for pain. Continue to work with [facility] PT/OT for gait/strength/balance training to restore/maintain function. Continue to f/u with neurosurg.  2. Hypercholesterolemia Continue zocor 40mg  daily.  3. Essential hypertension Slightly elevated today. BP goal for him <140/90. Continue lisinopril 10mg  daily. Encourage keeping BP diary and review with pcp. PCP to adjust BP med as necessary.   4. Diabetic polyneuropathy associated with type 2 diabetes mellitus Stable. CBG range 120-210s. Continue glipizide 10mg  twice daily, metformin 100mg  twice daily, and [place] 100mg  daily with neurotin 600mg  three times daily for neuropathy. Continue to f/u with pcp  5. Constipation, unspecified constipation type No issues. Continue colace and miralax as needed for constipation. Encourage hydration and ambulation as tolerated.   6. Acute blood loss anemia Most likely post-op. Recent h&h 10/30.2. F/u with pcp   7. Gait instability Continue to work with [facility] PT/OT for gait/strength/balance training to restore/maximze function. Fall risk precautions  8. Xerosis Encourage increasing fluid intake and using moisturizer daily.    Home health  services: PT/OT DME required:None PCP follow-up: Dr. Doren Custard on 12/20/14 @10 :15 30-day supply of prescription medications provided (#30 Norco 10/325mg )   Time spent: 40 minutes on care coordination and counseling  Family/Staff Communication Plan of care discussed with patient and nursing staff. Patient and nursing staff verbalized understanding and agree with plan of care. No additional questions or concerns reported.    Arthur Holms, MSN, AGNP-C [facility] [address] [place], [place] [address] [phone] [8am-5pm] After hours: [phone]

## 2014-12-13 ENCOUNTER — Telehealth: Payer: Self-pay | Admitting: *Deleted

## 2014-12-13 DIAGNOSIS — E1140 Type 2 diabetes mellitus with diabetic neuropathy, unspecified: Secondary | ICD-10-CM | POA: Diagnosis not present

## 2014-12-13 DIAGNOSIS — E785 Hyperlipidemia, unspecified: Secondary | ICD-10-CM | POA: Diagnosis not present

## 2014-12-13 DIAGNOSIS — Z4789 Encounter for other orthopedic aftercare: Secondary | ICD-10-CM | POA: Diagnosis not present

## 2014-12-13 NOTE — Telephone Encounter (Signed)
Received fax from [facility] Discharge Dispostion   Date of admit: 11/24/2014  Date of discharge: 12/12/14  Level of [place] and rehab  Attending physician:David Ronnald Ramp, MD  VVZ:SMOLMBE [place],MD  Discharge Dispostion:Discharged to home, Self care  DX:M48.06-Spinal Stenosis,lumbar region

## 2014-12-20 ENCOUNTER — Ambulatory Visit (INDEPENDENT_AMBULATORY_CARE_PROVIDER_SITE_OTHER): Payer: PPO | Admitting: Physician Assistant

## 2014-12-20 ENCOUNTER — Inpatient Hospital Stay: Payer: PPO | Admitting: Physician Assistant

## 2014-12-20 ENCOUNTER — Encounter: Payer: Self-pay | Admitting: Physician Assistant

## 2014-12-20 ENCOUNTER — Ambulatory Visit: Payer: Medicare Other | Admitting: Physician Assistant

## 2014-12-20 VITALS — BP 126/62 | HR 92 | Temp 98.5°F | Resp 18 | Wt 210.0 lb

## 2014-12-20 DIAGNOSIS — Z09 Encounter for follow-up examination after completed treatment for conditions other than malignant neoplasm: Secondary | ICD-10-CM | POA: Diagnosis not present

## 2014-12-20 NOTE — Progress Notes (Signed)
Patient ID: MAHONRI SEIDEN MRN: [id number], DOB: [birth year], 80 y.o. Date of Encounter: 12/20/2014, 3:54 PM    Chief Complaint:  Chief Complaint  Patient presents with  . hosp follow up    back surg     HPI: 80 y.o. year old white male here for follow-up office visit status post hospitalization and low back surgery. Daughter is here with him for this visit. Patient states that he sees the surgeon for follow-up visit this upcoming Monday and at that time they will determine whether he can return to driving.  I reviewed his discharge summary from the hospital. Hospitalized 2/17 through 11/24/14. He then went to skilled nursing facility. They report that he was there in that facility for 19 days.  Pt states that he feels pretty good. Says that he has some soreness in his back but otherwise is good. Says that he really doesn't feel much weakness.  feels that his strength is pretty good and he feels like he's getting around well. Says physical therapy is coming to his house every Wednesday. Says at the follow-up appointment with the surgeon on Monday they will determine when he can return to driving and will also determine how much further physical therapy he needs.  No specific complaints or concerns today. He had no significant complications with this surgery and hospitalization.     Home Meds:   Outpatient Prescriptions Prior to Visit  Medication Sig Dispense Refill  . ACCU-CHEK AVIVA PLUS test strip USE ONE STRIP TO CHECK GLUCOSE TWICE DAILY 100 each 11  . acetaminophen (TYLENOL) 500 MG tablet Take 1,000 mg by mouth every 8 (eight) hours as needed for mild pain or moderate pain (back pain).    Marland Kitchen aspirin EC 81 MG tablet Take 81 mg by mouth daily.      Marland Kitchen aspirin-sod bicarb-citric acid (ALKA-SELTZER) 325 MG TBEF tablet Take 325 mg by mouth daily as needed (morning congestion).    . cholecalciferol (VITAMIN D) 1000 UNITS tablet Take 1,000 Units by mouth daily.      Marland Kitchen gabapentin  (NEURONTIN) 600 MG tablet TAKE ONE TABLET BY MOUTH THREE TIMES DAILY 90 tablet 5  . glipiZIDE (GLUCOTROL) 10 MG tablet TAKE ONE TABLET BY MOUTH TWICE DAILY BEFORE  A  MEAL 60 tablet 2  . HYDROcodone-acetaminophen (NORCO) 10-325 MG per tablet Take 1 tablet by mouth every 4 (four) hours as needed.    Marland Kitchen JANUVIA 100 MG tablet TAKE ONE TABLET BY MOUTH ONCE DAILY 30 tablet 1  . lisinopril (PRINIVIL,ZESTRIL) 10 MG tablet TAKE ONE TABLET BY MOUTH ONCE DAILY 30 tablet 3  . metFORMIN (GLUCOPHAGE) 1000 MG tablet TAKE ONE TABLET BY MOUTH TWICE DAILY WITH MEALS 60 tablet 3  . Multiple Vitamin (MULITIVITAMIN WITH MINERALS) TABS Take 1 tablet by mouth daily.      . simvastatin (ZOCOR) 40 MG tablet TAKE ONE TABLET BY MOUTH ONCE DAILY AT BEDTIME 30 tablet 3  . Cyanocobalamin (B-12 PO) Take 1 tablet by mouth daily.    . fish oil-omega-3 fatty acids 1000 MG capsule Take 3 g by mouth 2 (two) times daily.      . niacin (NIASPAN) 500 MG CR tablet TAKE ONE TABLET BY MOUTH AT BEDTIME (Patient not taking: Reported on 12/20/2014) 30 tablet 0  . potassium gluconate 595 MG TABS tablet Take 595 mg by mouth daily.     No facility-administered medications prior to visit.    Allergies: No Known Allergies    Review of Systems:  See HPI for pertinent ROS. All other ROS negative.    Physical Exam: Blood pressure 126/62, pulse 92, temperature 98.5 F (36.9 C), temperature source Oral, resp. rate 18, weight 210 lb (95.255 kg)., Body mass index is 31.94 kg/(m^2). General: WNWD WM.  Appears in no acute distress. Neck: Supple. No thyromegaly. No lymphadenopathy. No carotid bruits. Lungs: Clear bilaterally to auscultation without wheezes, rales, or rhonchi. Breathing is unlabored. Heart: Regular rhythm. No murmurs, rubs, or gallops. Msk:  Strength and tone normal for age. Extremities/Skin: Warm and dry. Neuro: Alert and oriented X 3. Moves all extremities spontaneously. Gait is normal. CNII-XII grossly in tact. Psych:   Responds to questions appropriately with a normal affect.     ASSESSMENT AND PLAN:  80 y.o. year old male with  1. Hospital discharge follow-up  He made it through the hospitalization and surgery with no significant complications. He is what doing well postoperatively as well. Last routine office visit was 09/13/14. Will have him schedule follow-up office visit here for about 3 weeks from now so we can get him back on schedule for regular office visits and lab work.   [address] [place], [place], [facility] 12/20/2014 3:54 PM

## 2015-01-09 ENCOUNTER — Other Ambulatory Visit: Payer: Self-pay | Admitting: Physician Assistant

## 2015-01-09 NOTE — Telephone Encounter (Signed)
Medication refilled per protocol. 

## 2015-01-10 ENCOUNTER — Ambulatory Visit (INDEPENDENT_AMBULATORY_CARE_PROVIDER_SITE_OTHER): Payer: PPO | Admitting: Physician Assistant

## 2015-01-10 ENCOUNTER — Encounter: Payer: Self-pay | Admitting: Physician Assistant

## 2015-01-10 VITALS — BP 160/78 | HR 68 | Temp 97.8°F | Resp 18 | Wt 215.0 lb

## 2015-01-10 DIAGNOSIS — Z9889 Other specified postprocedural states: Secondary | ICD-10-CM

## 2015-01-10 DIAGNOSIS — I10 Essential (primary) hypertension: Secondary | ICD-10-CM | POA: Diagnosis not present

## 2015-01-10 DIAGNOSIS — E1142 Type 2 diabetes mellitus with diabetic polyneuropathy: Secondary | ICD-10-CM | POA: Diagnosis not present

## 2015-01-10 DIAGNOSIS — E7800 Pure hypercholesterolemia, unspecified: Secondary | ICD-10-CM

## 2015-01-10 DIAGNOSIS — E1165 Type 2 diabetes mellitus with hyperglycemia: Secondary | ICD-10-CM | POA: Diagnosis not present

## 2015-01-10 MED ORDER — HYDROCODONE-ACETAMINOPHEN 5-325 MG PO TABS: 1.0000 | ORAL_TABLET | Freq: Four times a day (QID) | ORAL | Status: DC | PRN

## 2015-01-10 MED ORDER — NIACIN ER (ANTIHYPERLIPIDEMIC) 500 MG PO TBCR: 500.0000 mg | EXTENDED_RELEASE_TABLET | Freq: Every day | ORAL | Status: DC

## 2015-01-10 NOTE — Progress Notes (Signed)
Patient ID: Christian [name] MRN: [id number], DOB: [birth year], 80 y.o. Date of Encounter: @DATE @  Chief Complaint:  Chief Complaint  Patient presents with  . 3 week follow up    needs RF hydrocodone    HPI: 80 y.o. year old white male  presents for routine followup visit.    Hypertension: Taking meds as directed. No adverse effects.  Hyperlipidemia: On Zocor 40mg  and on Niaspan. No myalgias or other adverse medication effects.  Diabetes: On metformin, Glucotrol, Januvia. 03/2014 he was having lower extremity edema so Actos was stopped and was changed to [place]. He is taking all medicines as directed. No hypoglycemia episodes.  Last visit with me was 12/20/14 for a postoperative visit after having lumbar laminectomy 11/22/14. He has done well with that surgery with no complications. A day he is requesting a refill on his hydrocodone. Has the medicine bottle with him which was given when he left the rehabilitation facility. The date was 12/13/14 on his prescription bottle for #30. He says that he only uses these occasionally when he had absolutely has to. However says that he does occasionally have significant pain and wants to have some pain medicine on hand to use if needed.  No other complaints or concerns today.  Past Medical History  Diagnosis Date  . Hypercholesterolemia   . Diabetic neuropathy   . Skin cancer of arm 04/20/2013    left upper arm  . Spinal stenosis 05/17/2014    L3-L4   . 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 HTN (hypertension) 03/03/2013  . Diabetes mellitus     Type 2  . History of kidney stones   . Urgency of urination   . Arthritis      Home Meds:  Outpatient Prescriptions Prior to Visit  Medication Sig Dispense Refill  . ACCU-CHEK AVIVA PLUS test strip USE ONE STRIP TO CHECK GLUCOSE TWICE DAILY 100 each 11  . acetaminophen (TYLENOL) 500 MG tablet Take 1,000 mg by mouth every 8 (eight) hours as needed for mild pain or moderate pain (back  pain).    Marland Kitchen aspirin EC 81 MG tablet Take 81 mg by mouth daily.      Marland Kitchen aspirin-sod bicarb-citric acid (ALKA-SELTZER) 325 MG TBEF tablet Take 325 mg by mouth daily as needed (morning congestion).    . cholecalciferol (VITAMIN D) 1000 UNITS tablet Take 1,000 Units by mouth daily.      . Cyanocobalamin (B-12 PO) Take 1 tablet by mouth daily.    . fish oil-omega-3 fatty acids 1000 MG capsule Take 3 g by mouth 2 (two) times daily.      Marland Kitchen gabapentin (NEURONTIN) 600 MG tablet TAKE ONE TABLET BY MOUTH THREE TIMES DAILY 90 tablet 5  . glipiZIDE (GLUCOTROL) 10 MG tablet TAKE ONE TABLET BY MOUTH TWICE DAILY BEFORE  A  MEAL 60 tablet 2  . JANUVIA 100 MG tablet TAKE ONE TABLET BY MOUTH ONCE DAILY 30 tablet 1  . lisinopril (PRINIVIL,ZESTRIL) 10 MG tablet TAKE ONE TABLET BY MOUTH ONCE DAILY 30 tablet 3  . metFORMIN (GLUCOPHAGE) 1000 MG tablet TAKE ONE TABLET BY MOUTH TWICE DAILY WITH MEALS 60 tablet 3  . Multiple Vitamin (MULITIVITAMIN WITH MINERALS) TABS Take 1 tablet by mouth daily.      . potassium gluconate 595 MG TABS tablet Take 595 mg by mouth daily.    . simvastatin (ZOCOR) 40 MG tablet TAKE ONE TABLET BY MOUTH ONCE DAILY AT BEDTIME 30 tablet 3  . HYDROcodone-acetaminophen (NORCO) 10-325 MG  per tablet Take 1 tablet by mouth every 4 (four) hours as needed.    . niacin (NIASPAN) 500 MG CR tablet TAKE ONE TABLET BY MOUTH AT BEDTIME 30 tablet 5   No facility-administered medications prior to visit.    Allergies: No Known Allergies  History   Social History  . Marital Status: Divorced    Spouse Name: N/A  . Number of Children: N/A  . Years of Education: N/A   Occupational History  . Not on file.   Social History Main Topics  . Smoking status: Former Smoker    Quit date: 03/04/2003  . Smokeless tobacco: Never Used  . Alcohol Use: No  . Drug Use: No  . Sexual Activity: Not on file   Other Topics Concern  . Not on file   Social History Narrative    No family history on file.   Review  of Systems:  See HPI for pertinent ROS. All other ROS negative.    Physical Exam: Blood pressure 160/78, pulse 68, temperature 97.8 F (36.6 C), temperature source Oral, resp. rate 18, weight 215 lb (97.523 kg)., Body mass index is 32.7 kg/(m^2). General: WM. Abdominal Obesity--Moderate. Appears in no acute distress. Neck: Supple. No thyromegaly. No lymphadenopathy.No carotid bruit.  Lungs: Clear bilaterally to auscultation without wheezes, rales, or rhonchi. Breathing is unlabored. Heart: RRR with S1 S2. No murmurs, rubs, or gallops. Abdomen: Soft, non-tender, non-distended with normoactive bowel sounds. No hepatomegaly. No rebound/guarding. No obvious abdominal masses. Musculoskeletal:  Strength and tone normal for age. Extremities/Skin: Warm and dry. 1+ - 2+ pitting edema at ankles bilaterally. No edema higher than this level.  Neuro: Alert and oriented X 3. Moves all extremities spontaneously. Gait is normal. CNII-XII grossly in tact. Psych:  Responds to questions appropriately with a normal affect. Diabetic foot exam: Inspection: He has a lot of scaly skin. However no open wound or sores. Sensation is intact. 2+Left DP pulse. 1+ Right DP pulse.   No palpable posterior tibial pulses bilaterally.      ASSESSMENT AND PLAN:  80 y.o. year old male with   1. Diabetes - Hemoglobin A1C   Microalbumin was performed 03/13/2014 Diabetic foot exam documented and quality metrics on 01/10/2015.   He does have annual eye exam.  On ASA 81mg  QD On statin. On ACE inhibitor.  Has had Pneumovax 23 in past. Had Prevnar 13-- 12/2013.  2. Hypercholesterolemia On Zocor and Niaspan. Lipid panel excellent at last check 06/14/14.   3. Essential hypertension Blood Pressure high today but usually at goal. WILL MONITOR --IF HIGH AGAIN AT NEXT OV, WILL INCREASE MEDS. On ACE inhibitor. Check BMET.  4. H/O  Bilateral lower extremity edema - hydrochlorothiazide (MICROZIDE) 12.5 MG capsule; Take 1  capsule (12.5 mg total) by mouth daily.  Dispense: 30 capsule; Refill: 5--- says he is no longer having lower extremity edema and is just using this when necessary.  5. S/P lumbar laminectomy He had been getting hydrocodone 10mg . Will decrease the dose. Discussed with patient. - HYDROcodone-acetaminophen (NORCO/VICODIN) 5-325 MG per tablet; Take 1 tablet by mouth every 6 (six) hours as needed.  Dispense: 30 tablet; Refill: 0    6. screening PSA: Normal 07/2011. Will not repeat given advanced age of 56.  55. screening colonoscopy: At recent office visits we have discussed that this was due.  At visit 06/2014 he says that he needs to get his back pain control prior to scheduling this. Given age 64, can probably stop these--will discuss with  pt  7. Immunizations: Pneumonia vaccine: Received Pneumovax here 05/01/2008. Updated with Prevnar 13 12/2013. Tetanus: Given here 07/23/2011 Zostavax: He checked with his insurance-- cost would be $200 patient defers. Influenza Vaccine: Received here 09/07/13. At office visit 06/14/2014 he was agreeable to go ahead and get his flu shot for this season.  Regular office visit 3 months. Sooner if needed.    Christian [name], [place], [facility] 01/10/2015 6:23 PM

## 2015-01-11 LAB — COMPLETE METABOLIC PANEL WITH GFR
ALT: 29 U/L (ref 0–53)
AST: 22 U/L (ref 0–37)
Albumin: 3.8 g/dL (ref 3.5–5.2)
Alkaline Phosphatase: 49 U/L (ref 39–117)
BUN: 22 mg/dL (ref 6–23)
CALCIUM: 9.3 mg/dL (ref 8.4–10.5)
CO2: 25 mEq/L (ref 19–32)
CREATININE: 1.02 mg/dL (ref 0.50–1.35)
Chloride: 107 mEq/L (ref 96–112)
GFR, Est African American: 81 mL/min
GFR, Est Non African American: 70 mL/min
Glucose, Bld: 104 mg/dL — ABNORMAL HIGH (ref 70–99)
Potassium: 4.5 mEq/L (ref 3.5–5.3)
Sodium: 141 mEq/L (ref 135–145)
Total Bilirubin: 0.4 mg/dL (ref 0.2–1.2)
Total Protein: 6.1 g/dL (ref 6.0–8.3)

## 2015-01-11 LAB — HEMOGLOBIN A1C
HEMOGLOBIN A1C: 7.1 % — AB (ref <5.7)
MEAN PLASMA GLUCOSE: 157 mg/dL — AB (ref <117)

## 2015-01-26 ENCOUNTER — Other Ambulatory Visit: Payer: Self-pay | Admitting: *Deleted

## 2015-01-26 MED ORDER — LISINOPRIL 10 MG PO TABS: 10.0000 mg | ORAL_TABLET | Freq: Every day | ORAL | Status: DC

## 2015-01-26 NOTE — Telephone Encounter (Signed)
Pt called stating he is needing refill on lisinopril

## 2015-01-30 ENCOUNTER — Other Ambulatory Visit: Payer: Self-pay | Admitting: Family Medicine

## 2015-01-30 MED ORDER — LISINOPRIL 10 MG PO TABS: 10.0000 mg | ORAL_TABLET | Freq: Every day | ORAL | Status: DC

## 2015-01-30 MED ORDER — SIMVASTATIN 40 MG PO TABS: 40.0000 mg | ORAL_TABLET | Freq: Every day | ORAL | Status: DC

## 2015-01-30 NOTE — Telephone Encounter (Signed)
Medication refilled per protocol.  meds had already been refilled but pharmacy said they didn't have them.  Sent again

## 2015-02-11 ENCOUNTER — Encounter (HOSPITAL_BASED_OUTPATIENT_CLINIC_OR_DEPARTMENT_OTHER): Payer: Self-pay

## 2015-02-22 ENCOUNTER — Other Ambulatory Visit: Payer: Self-pay | Admitting: Physician Assistant

## 2015-02-22 NOTE — Telephone Encounter (Signed)
Christian [name] requesting speak with you regarding his test strips - insurance would not pay for them [phone]

## 2015-02-23 NOTE — Telephone Encounter (Signed)
Told patient to call his insurance and find out which diabetic supplies they cover.  Let me know and we will order those for him.

## 2015-02-26 ENCOUNTER — Other Ambulatory Visit: Payer: Self-pay | Admitting: Physician Assistant

## 2015-02-26 NOTE — Telephone Encounter (Signed)
Medication refill per protocol °

## 2015-02-28 ENCOUNTER — Other Ambulatory Visit: Payer: Self-pay | Admitting: Physician Assistant

## 2015-02-28 ENCOUNTER — Telehealth: Payer: Self-pay | Admitting: Physician Assistant

## 2015-02-28 DIAGNOSIS — Z9889 Other specified postprocedural states: Secondary | ICD-10-CM

## 2015-02-28 MED ORDER — HYDROCODONE-ACETAMINOPHEN 5-325 MG PO TABS: 1.0000 | ORAL_TABLET | Freq: Four times a day (QID) | ORAL | Status: DC | PRN

## 2015-02-28 NOTE — Telephone Encounter (Signed)
Rx ready for pick up.  No answer when try call pt

## 2015-02-28 NOTE — Telephone Encounter (Signed)
Patient requesting refill on his hydrocodone  [phone]

## 2015-02-28 NOTE — Telephone Encounter (Signed)
Approved for #[id number] 

## 2015-02-28 NOTE — Telephone Encounter (Signed)
LRF 01/10/15 #30  LOV 01/10/15  OK refill?

## 2015-02-28 NOTE — Telephone Encounter (Signed)
Patient returning your call [phone]

## 2015-03-08 MED ORDER — BLOOD GLUCOSE TEST VI STRP: ORAL_STRIP | Status: DC

## 2015-03-08 NOTE — Telephone Encounter (Signed)
Generic prescription sent to pharmacy.

## 2015-03-19 ENCOUNTER — Other Ambulatory Visit: Payer: Self-pay | Admitting: Physician Assistant

## 2015-03-19 NOTE — Telephone Encounter (Signed)
Script sent to pharmacy.

## 2015-03-26 ENCOUNTER — Other Ambulatory Visit: Payer: Self-pay | Admitting: Physician Assistant

## 2015-03-27 NOTE — Telephone Encounter (Signed)
Medication refilled per protocol. 

## 2015-04-12 ENCOUNTER — Encounter: Payer: Self-pay | Admitting: Physician Assistant

## 2015-04-12 ENCOUNTER — Ambulatory Visit (INDEPENDENT_AMBULATORY_CARE_PROVIDER_SITE_OTHER): Payer: PPO | Admitting: Physician Assistant

## 2015-04-12 VITALS — BP 104/58 | HR 68 | Temp 97.4°F | Resp 20 | Ht 68.0 in | Wt 213.0 lb

## 2015-04-12 DIAGNOSIS — E1142 Type 2 diabetes mellitus with diabetic polyneuropathy: Secondary | ICD-10-CM

## 2015-04-12 DIAGNOSIS — E7800 Pure hypercholesterolemia, unspecified: Secondary | ICD-10-CM

## 2015-04-12 DIAGNOSIS — Z9889 Other specified postprocedural states: Secondary | ICD-10-CM

## 2015-04-12 DIAGNOSIS — R600 Localized edema: Secondary | ICD-10-CM

## 2015-04-12 DIAGNOSIS — E1165 Type 2 diabetes mellitus with hyperglycemia: Secondary | ICD-10-CM

## 2015-04-12 DIAGNOSIS — I10 Essential (primary) hypertension: Secondary | ICD-10-CM | POA: Diagnosis not present

## 2015-04-12 LAB — COMPLETE METABOLIC PANEL WITH GFR
ALBUMIN: 4.1 g/dL (ref 3.5–5.2)
ALT: 53 U/L (ref 0–53)
AST: 43 U/L — ABNORMAL HIGH (ref 0–37)
Alkaline Phosphatase: 54 U/L (ref 39–117)
BUN: 28 mg/dL — ABNORMAL HIGH (ref 6–23)
CHLORIDE: 101 meq/L (ref 96–112)
CO2: 24 meq/L (ref 19–32)
Calcium: 9.6 mg/dL (ref 8.4–10.5)
Creat: 1.36 mg/dL — ABNORMAL HIGH (ref 0.50–1.35)
GFR, EST AFRICAN AMERICAN: 57 mL/min — AB
GFR, Est Non African American: 49 mL/min — ABNORMAL LOW
GLUCOSE: 183 mg/dL — AB (ref 70–99)
POTASSIUM: 4.5 meq/L (ref 3.5–5.3)
SODIUM: 137 meq/L (ref 135–145)
TOTAL PROTEIN: 6.6 g/dL (ref 6.0–8.3)
Total Bilirubin: 0.4 mg/dL (ref 0.2–1.2)

## 2015-04-12 LAB — LIPID PANEL
Cholesterol: 154 mg/dL (ref 0–200)
HDL: 40 mg/dL (ref >=40)
LDL CALC: 78 mg/dL (ref 0–99)
Total CHOL/HDL Ratio: 3.9 Ratio
Triglycerides: 182 mg/dL — ABNORMAL HIGH (ref <150)
VLDL: 36 mg/dL (ref 0–40)

## 2015-04-12 MED ORDER — GLIPIZIDE ER 5 MG PO TB24: 5.0000 mg | ORAL_TABLET | Freq: Every day | ORAL | Status: DC

## 2015-04-12 NOTE — Progress Notes (Signed)
Patient ID: Christian [name] MRN: [id number], DOB: [birth year], 80 y.o. Date of Encounter: @DATE @  Chief Complaint:  Chief Complaint  Patient presents with  . Medication Management    pt fasting - pt is only taking 5 mg of glipizide - would like rx changed so he does not have to cut in half  . Medication Refill    HPI: 80 y.o. year old white male  presents for routine followup visit.    Hypertension: Taking meds as directed. No adverse effects.  Hyperlipidemia: On Zocor 40mg  and on Niaspan. No myalgias or other adverse medication effects.  Diabetes: On metformin, Glucotrol, Januvia. 03/2014 he was having lower extremity edema so Actos was stopped and was changed to [place]. He is taking all medicines as directed. No hypoglycemia episodes.  At OV 04/12/2015--is that he tries to check his blood sugar every morning but sometimes it's every 2 or 3 days. Even fasting morning he is getting high readings recently in the 200s sometimes even up to 300. Says that he has been "on the go ". Says that he is "not eating like are to ". Says that he has been working trying to get a vehicle to where it will pass inspection. Says that he has had been working on things around the house. Says that he "has been eating biscuits, potpie, sausage/ egg/ cheese croissant biscuits, french fries--all these things I know I shouldn't be eating". Asked exactly why this has changed but other than just being busy with these projects around the house he cannot give me any other answer. He has lived alone for 20 years. Says that he had been cooking healthier foods but says that he "hates cooking".  At visit with me  12/20/14 was here for a postoperative visit after having lumbar laminectomy 11/22/14. He has done well with that surgery with no complications. At that [place], was  requesting a refill on his hydrocodone. Has the medicine bottle with him which was given when he left the rehabilitation facility. The date was  12/13/14 on his prescription bottle for #30. He says that he only uses these occasionally when he had absolutely has to. However says that he does occasionally have significant pain and wants to have some pain medicine on hand to use if needed.  No other complaints or concerns today.  Past Medical History  Diagnosis Date  . Hypercholesterolemia   . Diabetic neuropathy   . Skin cancer of arm 04/20/2013    left upper arm  . Spinal stenosis 05/17/2014    L3-L4   . 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 HTN (hypertension) 03/03/2013  . Diabetes mellitus     Type 2  . History of kidney stones   . Urgency of urination   . Arthritis      Home Meds:  Outpatient Prescriptions Prior to Visit  Medication Sig Dispense Refill  . acetaminophen (TYLENOL) 500 MG tablet Take 1,000 mg by mouth every 8 (eight) hours as needed for mild pain or moderate pain (back pain).    Marland Kitchen aspirin EC 81 MG tablet Take 81 mg by mouth daily.      Marland Kitchen aspirin-sod bicarb-citric acid (ALKA-SELTZER) 325 MG TBEF tablet Take 325 mg by mouth daily as needed (morning congestion).    . cholecalciferol (VITAMIN D) 1000 UNITS tablet Take 1,000 Units by mouth daily.      . Cyanocobalamin (B-12 PO) Take 1 tablet by mouth daily.    . fish oil-omega-3 fatty acids 1000 MG  capsule Take 3 g by mouth 2 (two) times daily.      Marland Kitchen gabapentin (NEURONTIN) 600 MG tablet TAKE ONE TABLET BY MOUTH THREE TIMES DAILY 90 tablet 5  . glipiZIDE (GLUCOTROL) 10 MG tablet TAKE ONE TABLET BY MOUTH TWICE DAILY BEFORE A MEAL (Patient taking differently: TAKE 1/2 TABLET BY MOUTH TWICE DAILY BEFORE A MEAL) 60 tablet 3  . Glucose Blood (BLOOD GLUCOSE TEST STRIPS) STRP Dispense based on patient and insurance preference. Use to monitor FSBS 2x daily. Dx: E11.9 100 each 11  . HYDROcodone-acetaminophen (NORCO/VICODIN) 5-325 MG per tablet Take 1 tablet by mouth every 6 (six) hours as needed. 30 tablet 0  . JANUVIA 100 MG tablet TAKE ONE TABLET BY MOUTH ONCE DAILY 30  tablet 2  . lisinopril (PRINIVIL,ZESTRIL) 10 MG tablet Take 1 tablet (10 mg total) by mouth daily. 90 tablet 1  . metFORMIN (GLUCOPHAGE) 1000 MG tablet TAKE ONE TABLET BY MOUTH TWICE DAILY WITH  MEALS 60 tablet 1  . Multiple Vitamin (MULITIVITAMIN WITH MINERALS) TABS Take 1 tablet by mouth daily.      . niacin (NIASPAN) 500 MG CR tablet Take 1 tablet (500 mg total) by mouth at bedtime. 30 tablet 5  . potassium gluconate 595 MG TABS tablet Take 595 mg by mouth daily.    . simvastatin (ZOCOR) 40 MG tablet Take 1 tablet (40 mg total) by mouth daily with breakfast. 90 tablet 1   No facility-administered medications prior to visit.    Allergies:  Allergies  Allergen Reactions  . Actos [Pioglitazone]     LE Edema--03/2014    History   Social History  . Marital Status: Divorced    Spouse Name: N/A  . Number of Children: N/A  . Years of Education: N/A   Occupational History  . Not on file.   Social History Main Topics  . Smoking status: Former Smoker    Quit date: 03/04/2003  . Smokeless tobacco: Never Used  . Alcohol Use: No  . Drug Use: No  . Sexual Activity: Not on file   Other Topics Concern  . Not on file   Social History Narrative    History reviewed. No pertinent family history.   Review of Systems:  See HPI for pertinent ROS. All other ROS negative.    Physical Exam: Blood pressure 104/58, pulse 68, temperature 97.4 F (36.3 C), temperature source Oral, resp. rate 20, height 5\' 8"  (1.727 m), weight 213 lb (96.616 kg)., Body mass index is 32.39 kg/(m^2). General: WM. Abdominal Obesity--Moderate. Appears in no acute distress. Neck: Supple. No thyromegaly. No lymphadenopathy.No carotid bruit.  Lungs: Clear bilaterally to auscultation without wheezes, rales, or rhonchi. Breathing is unlabored. Heart: RRR with S1 S2. No murmurs, rubs, or gallops. Abdomen: Soft, non-tender, non-distended with normoactive bowel sounds. No hepatomegaly. No rebound/guarding. No obvious  abdominal masses. Musculoskeletal:  Strength and tone normal for age. Extremities/Skin: Warm and dry. 1+ - 2+ pitting edema at ankles bilaterally. No edema higher than this level.  Neuro: Alert and oriented X 3. Moves all extremities spontaneously. Gait is normal. CNII-XII grossly in tact. Psych:  Responds to questions appropriately with a normal affect. Diabetic foot exam: Inspection: He has a lot of scaly skin. However no open wound or sores. Sensation is intact. 2+Left DP pulse. 1+ Right DP pulse.   No palpable posterior tibial pulses bilaterally.      ASSESSMENT AND PLAN:  80 y.o. year old male with   1. Diabetes - Hemoglobin A1C  Microalbumin was performed 03/13/2014. Repeat 04/12/2015. Diabetic foot exam documented and quality metrics on 01/10/2015.   He does have annual eye exam.  On ASA 81mg  QD On statin. On ACE inhibitor.  Has had Pneumovax 23 in past. Had Prevnar 13-- 12/2013.  2. Hypercholesterolemia On Zocor and Niaspan. Lipid panel excellent at last check. Recheck 04/12/2015.   3. Essential hypertension Blood Pressure controlled-- noted that today 04/12/2015 blood pressure a little low but also noted that at my last visit 01/10/15 his blood pressure was a little high-- therefore, will continue current medicines the same and continue to monitor. On ACE inhibitor. Check BMET.  4. H/O  Bilateral lower extremity edema - hydrochlorothiazide (MICROZIDE) 12.5 MG capsule; Take 1 capsule (12.5 mg total) by mouth daily.  Dispense: 30 capsule; Refill: 5--- says he is no longer having lower extremity edema and is just using this when necessary.  5. S/P lumbar laminectomy   6. screening PSA: Normal 07/2011. Will not repeat given advanced age of 60.  35. screening colonoscopy: At recent office visits we have discussed that this was due.  At visit 06/2014 he says that he needs to get his back pain control prior to scheduling this. Given age 28, can probably stop these--will discuss  with pt Discussed colonoscpoy with patient at visit 04/12/15. He does seem interested in wanting to have continued follow-up colonoscopies. However at today's visit he just says it to much is going on and for [place] to rediscuss it at his next visit once things settle down.  7. Immunizations: Pneumonia vaccine: Received Pneumovax here 05/01/2008. Updated with Prevnar 13 12/2013. Tetanus: Given here 07/23/2011 Zostavax: He checked with his insurance-- cost would be $200 patient defers. Influenza Vaccine: Received here 09/07/13. At office visit 06/14/2014 he was agreeable to go ahead and get his flu shot for this season.  Regular office visit 3 months. Sooner if needed.    Signed, [address] [place], [place], [facility] 04/12/2015 9:47 AM

## 2015-04-13 ENCOUNTER — Other Ambulatory Visit: Payer: Self-pay | Admitting: Family Medicine

## 2015-04-13 DIAGNOSIS — E1165 Type 2 diabetes mellitus with hyperglycemia: Secondary | ICD-10-CM

## 2015-04-13 DIAGNOSIS — IMO0002 Reserved for concepts with insufficient information to code with codable children: Secondary | ICD-10-CM

## 2015-04-13 LAB — HEMOGLOBIN A1C
HEMOGLOBIN A1C: 8.9 % — AB (ref <5.7)
Mean Plasma Glucose: 209 mg/dL — ABNORMAL HIGH (ref <117)

## 2015-04-13 LAB — MICROALBUMIN, URINE: Microalb, Ur: 1.2 mg/dL (ref <2.0)

## 2015-04-13 MED ORDER — GLIPIZIDE ER 10 MG PO TB24: 10.0000 mg | ORAL_TABLET | Freq: Every day | ORAL | Status: DC

## 2015-05-03 ENCOUNTER — Encounter: Payer: Self-pay | Admitting: Physician Assistant

## 2015-05-03 ENCOUNTER — Ambulatory Visit (INDEPENDENT_AMBULATORY_CARE_PROVIDER_SITE_OTHER): Payer: PPO | Admitting: Physician Assistant

## 2015-05-03 VITALS — BP 134/68 | HR 70 | Temp 97.5°F | Resp 18 | Ht 68.0 in | Wt 208.0 lb

## 2015-05-03 DIAGNOSIS — E1165 Type 2 diabetes mellitus with hyperglycemia: Secondary | ICD-10-CM

## 2015-05-03 MED ORDER — CANAGLIFLOZIN 300 MG PO TABS: 300.0000 mg | ORAL_TABLET | Freq: Every day | ORAL | Status: DC

## 2015-05-03 NOTE — Progress Notes (Addendum)
Patient ID: PORTER MOES MRN: [id number], DOB: 04/01/1935, 80 y.o. Date of Encounter: @DATE @  Chief Complaint:  Chief Complaint  Patient presents with  . 3 week F/U    DM/ Kidney function- is fasting    HPI: 80 y.o. year old white male  presents to discuss Diabetes.  NOTE---ADDENDUM ADDED 06/14/2015---- RECEIVED NOTE IN EPIC FROM RN HEALTH COACH---SEE HER NOTE DOCUMENTED IN EPIC--- ---SHE WILL CONT TO F/U WITH PT-------  Hypertension: Taking meds as directed. No adverse effects.  Hyperlipidemia: On Zocor 40mg  and on Niaspan. No myalgias or other adverse medication effects.  Diabetes: On metformin, Glucotrol, Januvia. 03/2014 he was having lower extremity edema so Actos was stopped and was changed to [place]. He is taking all medicines as directed. No hypoglycemia episodes.  At OV 04/12/2015--is that he tries to check his blood sugar every morning but sometimes it's every 2 or 3 days. Even fasting morning he is getting high readings recently in the 200s sometimes even up to 300. Says that he has been "on the go ". Says that he is "not eating like are to ". Says that he has been working trying to get a vehicle to where it will pass inspection. Says that he has had been working on things around the house. Says that he "has been eating biscuits, potpie, sausage/ egg/ cheese croissant biscuits, french fries--all these things I know I shouldn't be eating". Asked exactly why this has changed but other than just being busy with these projects around the house he cannot give me any other answer. He has lived alone for 20 years. Says that he had been cooking healthier foods but says that he "hates cooking".  At 04/12/2015 OV, labs revealed HgbA1C 8.9.  Also Creatinine up to 1.36.  At that time increased the Glucotrol XL to 10 mg daily. Told him to document blood sugar readings and bring that in to visit in 3 weeks.  He comes in for f/u OV 05/03/2015. He brings blood sugar readings. Has  the following for fasting morning readings: 229, 245, 166, 222, 240, 183, 197, 210, 164, 289, 212, 232, 999, 265, 208, 194, 171, 219, 212 "PM Readings": 117, 190, 172, 123, 218, and 96, 165, 232, 42, 249, 202, 215, 250, 220, 188, 247, 145.  At visit 05/03/2015 discussed with him the fact that we are running out of medication options and the fact that his sugar is not controlled.  Also discussed that the higher his sugar is the worse his kidney number will be.  Discussed that if his kidney numbers continue to rise will have to stop metformin and will have to decrease Januvia which is going to make his blood sugar even worse. Discussed his diet and the diet that he reported 04/12/2015. Also, I discussed the fact that he had lived alone for 20 years and asked if he had always been eating this way or if his diet recently worsened. He says that for the most part he has always eaten this way and really his diet has not changed much. Discussed then, if this is the case, that it's just that his pancreas is not working as well as it had in the past and that pancreatic function is gradually worsening. Discussed his breakfast biscuits and the fact that the sausage egg cheese has minimal carbohydrate but if he could just cut out the bread part-- or at least decrease the amount of bread that he is eating with that. Discussed french fries and  the fact that this is potatoes which his carbohydrates and this needs to be cut out. Discussed potpie having crust which is basically bread/carbohydrate and this needs to be cut out. He says that he does like beans and chicken. Discussed that he can eat as much of this as he wants. Gave him another carbohydrate handout to remind him the foods that he needs to be avoiding/limiting as much as possible.  He states that the Januvia cost him $45. Today I gave him 28 sample tablets to help save him some money.  Today I printed in AVS and marked the Glucotrol XL 10 mg for him to make  sure that he has increased to that dose as directed at the time of labs 04/12/2015.  Also at today's visit 05/03/15 I am adding Invokana. We had some samples of the 100 mg dose so I given him #10 of these to start taking one each morning. I have sent in a prescription for the 300 mg strength and he will start taking that prescription strength after he finishes up the samples. (Will f/u with him at next OV to find out how much this COSTS him and whether he can afford this--cannot use savings card--Medicare) He is to work on his diet as well as these medication changes. Explained to him that if we cannot get his sugar controlled with this, that he will need to take Lantus--- he says that he did use the Lantus pens in the past and is aware of them. He will have follow-up visit here in 3 months to reassess.   At visit with me  12/20/14 was here for a postoperative visit after having lumbar laminectomy 11/22/14. He has done well with that surgery with no complications. At that [place], was  requesting a refill on his hydrocodone. Has the medicine bottle with him which was given when he left the rehabilitation facility. The date was 12/13/14 on his prescription bottle for #30. He says that he only uses these occasionally when he had absolutely has to. However says that he does occasionally have significant pain and wants to have some pain medicine on hand to use if needed.  No other complaints or concerns today.  Past Medical History  Diagnosis Date  . Hypercholesterolemia   . Diabetic neuropathy   . Skin cancer of arm 04/20/2013    left upper arm  . Spinal stenosis 05/17/2014    L3-L4   . 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 HTN (hypertension) 03/03/2013  . Diabetes mellitus     Type 2  . History of kidney stones   . Urgency of urination   . Arthritis      Home Meds:  Outpatient Prescriptions Prior to Visit  Medication Sig Dispense Refill  . acetaminophen (TYLENOL) 500 MG tablet Take 1,000 mg by  mouth every 8 (eight) hours as needed for mild pain or moderate pain (back pain).    Marland Kitchen aspirin EC 81 MG tablet Take 81 mg by mouth daily.      Marland Kitchen aspirin-sod bicarb-citric acid (ALKA-SELTZER) 325 MG TBEF tablet Take 325 mg by mouth daily as needed (morning congestion).    . cholecalciferol (VITAMIN D) 1000 UNITS tablet Take 1,000 Units by mouth daily.      . Cyanocobalamin (B-12 PO) Take 1 tablet by mouth daily.    . fish oil-omega-3 fatty acids 1000 MG capsule Take 3 g by mouth 2 (two) times daily.      Marland Kitchen gabapentin (NEURONTIN) 600 MG tablet TAKE  ONE TABLET BY MOUTH THREE TIMES DAILY 90 tablet 5  . glipiZIDE (GLUCOTROL XL) 10 MG 24 hr tablet Take 1 tablet (10 mg total) by mouth daily with breakfast. 30 tablet 5  . Glucose Blood (BLOOD GLUCOSE TEST STRIPS) STRP Dispense based on patient and insurance preference. Use to monitor FSBS 2x daily. Dx: E11.9 100 each 11  . HYDROcodone-acetaminophen (NORCO/VICODIN) 5-325 MG per tablet Take 1 tablet by mouth every 6 (six) hours as needed. 30 tablet 0  . JANUVIA 100 MG tablet TAKE ONE TABLET BY MOUTH ONCE DAILY 30 tablet 2  . lisinopril (PRINIVIL,ZESTRIL) 10 MG tablet Take 1 tablet (10 mg total) by mouth daily. 90 tablet 1  . metFORMIN (GLUCOPHAGE) 1000 MG tablet TAKE ONE TABLET BY MOUTH TWICE DAILY WITH  MEALS 60 tablet 1  . Multiple Vitamin (MULITIVITAMIN WITH MINERALS) TABS Take 1 tablet by mouth daily.      . niacin (NIASPAN) 500 MG CR tablet Take 1 tablet (500 mg total) by mouth at bedtime. 30 tablet 5  . potassium gluconate 595 MG TABS tablet Take 595 mg by mouth daily.    . simvastatin (ZOCOR) 40 MG tablet Take 1 tablet (40 mg total) by mouth daily with breakfast. 90 tablet 1   No facility-administered medications prior to visit.    Allergies:  Allergies  Allergen Reactions  . Actos [Pioglitazone]     LE Edema--03/2014    History   Social History  . Marital Status: Divorced    Spouse Name: N/A  . Number of Children: N/A  . Years of  Education: N/A   Occupational History  . Not on file.   Social History Main Topics  . Smoking status: Former Smoker    Quit date: 03/04/2003  . Smokeless tobacco: Never Used  . Alcohol Use: No  . Drug Use: No  . Sexual Activity: Not on file   Other Topics Concern  . Not on file   Social History Narrative    History reviewed. No pertinent family history.   Review of Systems:  See HPI for pertinent ROS. All other ROS negative.    Physical Exam: Blood pressure 134/68, pulse 70, temperature 97.5 F (36.4 C), temperature source Oral, resp. rate 18, height 5\' 8"  (1.727 m), weight 208 lb (94.348 kg)., Body mass index is 31.63 kg/(m^2). General: WM. Abdominal Obesity--Moderate. Appears in no acute distress. Neck: Supple. No thyromegaly. No lymphadenopathy.No carotid bruit.  Lungs: Clear bilaterally to auscultation without wheezes, rales, or rhonchi. Breathing is unlabored. Heart: RRR with S1 S2. No murmurs, rubs, or gallops. Abdomen: Soft, non-tender, non-distended with normoactive bowel sounds. No hepatomegaly. No rebound/guarding. No obvious abdominal masses. Musculoskeletal:  Strength and tone normal for age. Extremities/Skin: Warm and dry. 1+ - 2+ pitting edema at ankles bilaterally. No edema higher than this level.  Neuro: Alert and oriented X 3. Moves all extremities spontaneously. Gait is normal. CNII-XII grossly in tact. Psych:  Responds to questions appropriately with a normal affect. Diabetic foot exam: Inspection: He has a lot of scaly skin. However no open wound or sores. Sensation is intact. 2+Left DP pulse. 1+ Right DP pulse.   No palpable posterior tibial pulses bilaterally.      ASSESSMENT AND PLAN:  80 y.o. year old male with     ---------------------------NOTE---ADDENDUM ADDED 06/14/2015---- RECEIVED NOTE IN EPIC FROM RN HEALTH COACH---SEE HER NOTE DOCUMENTED IN EPIC--- ---SHE WILL CONT TO F/U WITH PT-------   1. Diabetes  SEE HPI---AT LAB  04/12/2015--Increased Glucotrol XL to  10mg  QD. AT OV 05/03/2015---Added Invokana  H/O LE Edema with Actos  Watch Kidney Function regarding Metformin and Januvia  At future OVs, will give Januvia samples when they are available, to keep down cost for pt.   - Hemoglobin A1C   Microalbumin was performed 03/13/2014. Repeat 04/12/2015. Diabetic foot exam documented and quality metrics on 01/10/2015.   He does have annual eye exam.  On ASA 81mg  QD On statin. On ACE inhibitor.  Has had Pneumovax 23 in past. Had Prevnar 13-- 12/2013.  2. Hypercholesterolemia On Zocor and Niaspan. Lipid panel excellent at last check. Recheck 04/12/2015.   3. Essential hypertension Blood Pressure controlled-- noted that today 04/12/2015 blood pressure a little low but also noted that at my last visit 01/10/15 his blood pressure was a little high-- therefore, will continue current medicines the same and continue to monitor. On ACE inhibitor. Check BMET.  4. H/O  Bilateral lower extremity edema - hydrochlorothiazide (MICROZIDE) 12.5 MG capsule; Take 1 capsule (12.5 mg total) by mouth daily.  Dispense: 30 capsule; Refill: 5--- says he is no longer having lower extremity edema and is just using this when necessary.  5. S/P lumbar laminectomy   6. screening PSA: Normal 07/2011. Will not repeat given advanced age of 37.  61. screening colonoscopy: At recent office visits we have discussed that this was due.  At visit 06/2014 he says that he needs to get his back pain control prior to scheduling this. Given age 30, can probably stop these--will discuss with pt Discussed colonoscpoy with patient at visit 04/12/15. He does seem interested in wanting to have continued follow-up colonoscopies. However at today's visit he just says it to much is going on and for [place] to rediscuss it at his next visit once things settle down.  7. Immunizations: Pneumonia vaccine: Received Pneumovax here 05/01/2008. Updated with Prevnar 13  12/2013. Tetanus: Given here 07/23/2011 Zostavax: He checked with his insurance-- cost would be $200 patient defers. Influenza Vaccine: Received here 09/07/13. At office visit 06/14/2014 he was agreeable to go ahead and get his flu shot for this season.  Regular office visit 3 months. Sooner if needed.    Marin Olp [place], [place], [facility] 05/03/2015 9:42 AM

## 2015-05-28 ENCOUNTER — Telehealth: Payer: Self-pay | Admitting: Physician Assistant

## 2015-05-28 DIAGNOSIS — Z9889 Other specified postprocedural states: Secondary | ICD-10-CM

## 2015-05-28 MED ORDER — METFORMIN HCL 1000 MG PO TABS: ORAL_TABLET | ORAL | Status: DC

## 2015-05-28 MED ORDER — HYDROCODONE-ACETAMINOPHEN 5-325 MG PO TABS: 1.0000 | ORAL_TABLET | Freq: Four times a day (QID) | ORAL | Status: DC | PRN

## 2015-05-28 NOTE — Telephone Encounter (Signed)
Patient needs rx for his norco  Also needs refill on his metformin to go to the [facility] [address]  [phone]

## 2015-05-28 NOTE — Telephone Encounter (Signed)
LRF Norco 02/28/15 #30  LOV 05/03/15  OK refill?

## 2015-05-28 NOTE — Telephone Encounter (Signed)
Approved. # 30 + 0. 

## 2015-05-28 NOTE — Telephone Encounter (Signed)
RX ready and pt aware

## 2015-06-04 NOTE — Patient Outreach (Signed)
[place] [facility]) Care Management  06/04/2015  EVERSON MOTT [birth year] [id number]   Referral from HTA tier 4 list, assigned to Quinn Plowman, [facility] for patient outreach.  Dalena Plantz L. Ita Fritzsche, [place] Care Management Assistant

## 2015-06-06 ENCOUNTER — Ambulatory Visit: Payer: Self-pay

## 2015-06-06 ENCOUNTER — Other Ambulatory Visit: Payer: Self-pay | Admitting: Pharmacist

## 2015-06-06 ENCOUNTER — Other Ambulatory Visit: Payer: Self-pay

## 2015-06-06 DIAGNOSIS — Z79899 Other long term (current) drug therapy: Secondary | ICD-10-CM

## 2015-06-06 DIAGNOSIS — E119 Type 2 diabetes mellitus without complications: Secondary | ICD-10-CM

## 2015-06-06 NOTE — Patient Outreach (Signed)
Christian [name] is a 80 y.o. male referred to pharmacy for medication assistance. Per referral, patient presented to the pharmacy on 06/04/15 to obtain one of his medications and was told the the price would be $183. After calling to follow up with patient's pharmacy and insurance company, [employer], I have found that the patient has just entered the "donut hole" of his insurance coverage.  Call to discuss this with the patient. Patient reports that he was not familiar with the "donut hole". Discuss what this means with the patient and let patient know that per his insurance company, while in the "donut hole" his cost for medications will be 45% of brand name cost and  58% of generics cost. Patient expresses concern that he will no longer be able to afford his medications. Discuss the Extra Help application with the patient, which the patient reports that he has never applied for. Also discuss patient assistance programs. Patient reports that he would like help in completing both the Extra Help application and patient assistance programs for his Invokana and Januvia. Let patient know that these processes can take several weeks to a month to get a response.   Patient reports that he is currently out of his Invokana and that he cannot afford to pay the $183 copay. Let Christian [name] know that without the Invokana, he should expect to see an increase in his blood sugar. Ask patient whether he and his PCP have discussed insulin as an option in the past. Patient reports that he tried using insulin many years ago, but did not like giving himself the injections. However, also reports that he has not liked being placed on so many oral medications. Let patient know that I will contact his PCP to let her know that he has entered the "donut hole", is currently unable to afford some of his medications as a result and that [facility] will be helping him to complete the Extra Help application and patient assistance  programs. Also asked patient to reach out to his PCP to discuss this with her, as well as his options for controlling his blood sugar, both while waiting to see if he qualifies for assistance and in case he does not qualify for assistance. Note patient's most recent A1C was 8.9 as of 04/12/15.   PLAN  1) Will send InBasket message to Care Management Assistant Damita Rhodie asking her to assist patient with completing these Extra Help and Patient Assistance Applications.  2) Will call the patient's PCP, Dena Billet to  let her know that he has entered the "donut hole", is currently unable to afford some of his medications as a result and that [facility] will be helping him to complete the Extra Help application and patient assistance programs.  3) Patient to also follow up with his PCP about options given the costs of these medication.  4) Will follow up with Mr. Reeves in 2 weeks.  [name], PharmD Clinical Pharmacist [place] Management [phone]

## 2015-06-06 NOTE — Patient Outreach (Signed)
Called the patient's PCP, Christian [name] to let her know that the patient has entered the "donut hole", is currently unable to afford his Invokana as a result and that [name] will be helping him to complete the Extra Help application and patient assistance programs, but that these can take weeks to a month to receive a response.  Spoke with [name], nurse with Christian [name]. Let [name] know about the conversation that I had had with [name]. [name] states that she will look to see if they have any samples in the office for the patient for his Invokana as well as his Januvia. States that she will let Christian [name] know so that she can call to follow up with the patient and discuss options, including insulin.  [name] with my contact information.  [name], PharmD Clinical Pharmacist [place] Management [phone]

## 2015-06-06 NOTE — Patient Outreach (Signed)
[place] [facility]) Care Management  06/06/2015  FINNEUS KANESHIRO 1934-12-18 [id number]   Request from Quinn Plowman, RN to assign [place] and Pharmacy, assigned Jon Billings, RN and [name], PharmD.  Thanks, Ronnell Freshwater. [place], [place] Assistant Phone: [phone] Fax: [phone]

## 2015-06-06 NOTE — Addendum Note (Signed)
Addended by: Quinn Plowman E on: 06/06/2015 10:42 AM   Modules accepted: Orders

## 2015-06-06 NOTE — Patient Outreach (Signed)
Christian [name] is a 80 y.o. male referred to pharmacy for medication assistance. Per the pharmacist at the patient's [place], Christian [name] had paid the tier 3 price of $45 when he last filled the [place] on 05/03/15. However, when the pharmacy ran the same medication through his insurance on 06/04/15, the cost to the patient came back as $184. Calling the patient's insurance company, [employer] to determine why the patient's copay has increased/ if he has reached the "donut hole".  Spoke with representative Christian [name] at [employer]. Christian [name] states that the patient has reached the "donut hole'. Reports that while in the donut hole, he will be responsible for 45% of brand medications and 58% of generic medications. Reports that patient is currently has spent $3,310 out of pocket and will remain in the donut hole until having spent $4,850 out of pocket.  [name], PharmD Clinical Pharmacist [place] Management [phone]

## 2015-06-06 NOTE — Patient Outreach (Signed)
[place] [facility]) Care Management  06/06/2015  Christian [name] 06-Jun-1935 [id number]   SUBJECTIVE:  Telephone call to patient regarding [employer] referral.  Discussed and offered [facility] care management services to patient .  Patient verbally agreed to receive services.   DIABETES:  Patient confirms diagnosis of diabetes.  Patient states he is not managing his diet well with his diabetes.  Patient unable to recall A1c level. Patient reports he checks his blood sugars 2 times per day.  Patient reports blood sugar this am fasting 168.  Patient reports primary MD follows for diabetes.  Patient states he is able to keep his appointments.  PHARMACY:  Patient states he received a medication on yesterday.  Unable to state name of medication or reason he is taking.   Patient states he is unable to afford.  States cost of medication $183.00.    ASSESSMENT:  [employer] referral.  Patient will benefit from referral to health coach for diabetes education and management. Patient will benefit from referral to pharmacy for medication assistance.  PLAN: RNCM will refer patient to health coach and pharmacy resident.  Quinn Plowman RN,BSN,CCM [place] Coordinator [phone]

## 2015-06-06 NOTE — Patient Outreach (Signed)
Christian [name] is a 80 y.o. male referred to pharmacy for medication assistance. Per referral, patient presented to the pharmacy on 06/04/15 to obtain one of his medications and was told the the price would be $183. Called to follow up with patient's pharmacy. Per the pharmacist at the patient's [place], [name] had paid the tier 3 price of $45 when he last filled the [name] on 05/03/15. However, when the pharmacy ran the same medication through his insurance on 06/04/15, the cost to the patient came back as $184.  Will now call the patient's insurance company, [employer] to determine why the patient's copay has increased/ if he has reached the "donut hole".  [name], PharmD Clinical Pharmacist [place] Management [phone]

## 2015-06-07 ENCOUNTER — Other Ambulatory Visit: Payer: Self-pay

## 2015-06-07 ENCOUNTER — Telehealth: Payer: Self-pay | Admitting: Family Medicine

## 2015-06-07 NOTE — Telephone Encounter (Signed)
Agree with this plan.

## 2015-06-07 NOTE — Patient Outreach (Signed)
[place] [facility]) Care Management  [place]  06/07/2015   Christian [name] 03-04-1935 [id number]  Telephone call to patient for initial health coach call.  Patient reports he is doing ok.  Patient reports he lives alone but has children who look in on him.  Patient reports he still drives and is independent with activities of daily living.  Patient reports a history of skin cancer limiting his outside time. Patient looking for a new dermatologist as his last one does not take [employer].   Diabetes: Patient reports that his blood sugars have not been the best.  He reports this morning his sugar was 156.  He states it averages around 150. Checked for A1c as patient not sure what his last one was.  A1c in July was 8.9.  Discussed with patient methods he could take such as portion control and limiting carbrohydrates.  Patient reports he does not check his feet daily.  Advised patient on importance of daily foot checks and care.  Patient has not had diabetic eye exam within the last year as he reports things have been hectic.  Discussed with patient importance of eye exam.  He verbalized understanding.     Current Medications:  Current Outpatient Prescriptions  Medication Sig Dispense Refill  . acetaminophen (TYLENOL) 500 MG tablet Take 1,000 mg by mouth every 8 (eight) hours as needed for mild pain or moderate pain (back pain).    Marland Kitchen aspirin EC 81 MG tablet Take 81 mg by mouth daily.      Marland Kitchen aspirin-sod bicarb-citric acid (ALKA-SELTZER) 325 MG TBEF tablet Take 325 mg by mouth daily as needed (morning congestion).    . cholecalciferol (VITAMIN D) 1000 UNITS tablet Take 1,000 Units by mouth daily.      . fish oil-omega-3 fatty acids 1000 MG capsule Take 3 g by mouth 2 (two) times daily.      Marland Kitchen gabapentin (NEURONTIN) 600 MG tablet TAKE ONE TABLET BY MOUTH THREE TIMES DAILY 90 tablet 5  . glipiZIDE (GLUCOTROL XL) 10 MG 24 hr tablet Take 1 tablet (10 mg total) by mouth daily  with breakfast. 30 tablet 5  . Glucose Blood (BLOOD GLUCOSE TEST STRIPS) STRP Dispense based on patient and insurance preference. Use to monitor FSBS 2x daily. Dx: E11.9 100 each 11  . HYDROcodone-acetaminophen (NORCO/VICODIN) 5-325 MG per tablet Take 1 tablet by mouth every 6 (six) hours as needed. 30 tablet 0  . JANUVIA 100 MG tablet TAKE ONE TABLET BY MOUTH ONCE DAILY 30 tablet 2  . lisinopril (PRINIVIL,ZESTRIL) 10 MG tablet Take 1 tablet (10 mg total) by mouth daily. 90 tablet 1  . metFORMIN (GLUCOPHAGE) 1000 MG tablet TAKE ONE TABLET BY MOUTH TWICE DAILY WITH  MEALS 60 tablet 5  . Multiple Vitamin (MULITIVITAMIN WITH MINERALS) TABS Take 1 tablet by mouth daily.      . niacin (NIASPAN) 500 MG CR tablet Take 1 tablet (500 mg total) by mouth at bedtime. 30 tablet 5  . potassium gluconate 595 MG TABS tablet Take 595 mg by mouth daily.    . simvastatin (ZOCOR) 40 MG tablet Take 1 tablet (40 mg total) by mouth daily with breakfast. 90 tablet 1  . canagliflozin (INVOKANA) 300 MG TABS tablet Take 300 mg by mouth daily before breakfast. (Patient not taking: Reported on 06/07/2015) 30 tablet 5  . Cyanocobalamin (B-12 PO) Take 1 tablet by mouth daily.     No current facility-administered medications for this visit.  Functional Status:  In your present state of health, do you have any difficulty performing the following activities: 06/07/2015 11/14/2014  Hearing? N N  Vision? N N  Difficulty concentrating or making decisions? N N  Walking or climbing stairs? N Y  Dressing or bathing? N N  Doing errands, shopping? N N  Preparing Food and eating ? N -  Using the Toilet? N -  In the past six months, have you accidently leaked urine? N -  Do you have problems with loss of bowel control? N -  Managing your Medications? N -  Managing your Finances? N -  Housekeeping or managing your Housekeeping? N -    Fall/Depression Screening: PHQ 2/9 Scores 06/07/2015 05/03/2015 12/07/2013  PHQ - 2 Score 0 0 6   PHQ- 9 Score - - 15   THN CM Care Plan Problem One        Most Recent Value   Care Plan Problem One  Knowledge deficit diabetes   Role Documenting the Problem One  [facility] [place] for Problem One  Active   THN Long Term Goal (31-90 days)  Patient will decrease A1c by 0.5 points within 90 days   THN Long Term Goal Start Date  06/07/15   Interventions for Problem One Long Term Goal  Explanation given to patient regarding purpose of A1c.  Discussed A1c goalsl with patient.    THN CM Short Term Goal #1 (0-30 days)  Patient will report making eye appointment within the next 30 days   THN CM Short Term Goal #1 Start Date  06/07/15   Interventions for Short Term Goal #1  Discussed with patient importance of yearly eye exam and diabetes.   THN CM Short Term Goal #2 (0-30 days)  Patient will be able to report maintaining low carbohydrate diet within 30 days   THN CM Short Term Goal #2 Start Date  06/07/15   Interventions for Short Term Goal #2  Discussed avoidance of high carbohydrate foods such as potatoes, breads, and pastas.       Assessment: Patient will benefit from health coach outreach for education regarding his diabetes.   Plan: RN Health Coach will get information on dermatologist for patient. RN Health Coach will provide ongoing education for patient on diabetes through phone calls and sending printed information to patient for further discussion.  RN Health Coach will send welcome packet with consent to patient as well as printed information on diabetes.  RN Health Coach will send initial barriers letter, assessment, and care plan to primary care physician.  RN Health Coach will contact patient within one month and patient agrees to next contact.  Jone Baseman, RN, MSN [place] [phone]

## 2015-06-07 NOTE — Telephone Encounter (Signed)
Pt has reached "[facility]" and can not afford Januvia and Invokana.  The [facility] Pharmacist is assisting him with applying for patient assistance and to see if he qualifies for low income subsidy.  For now have offered patient 1 month samples of both.  He will pick up tomorrow.  By end of month hopefully we will know if he qualifies for any of these programs.  If not provider will need to decide how to proceed.

## 2015-06-13 NOTE — Patient Outreach (Signed)
[name] [facility]) Care Management  06/13/2015  Christian [name] [birth year] [id number]   I called [name] today to enroll him into Extra Help.  He was willing to do it over the phone.  We completed the application online and submitted it today.  He should hear from social security administration in the next 2 to 4 weeks.  He is going to call me back when he hears from them. We also completed patient assistance applications for his Invokana and Januvia medications. I will follow up in 4 weeks.  [name] L. Murad Staples, [place] Care Management Assistant

## 2015-06-18 ENCOUNTER — Other Ambulatory Visit: Payer: Self-pay | Admitting: Physician Assistant

## 2015-06-19 NOTE — Telephone Encounter (Signed)
Medication refilled per protocol. 

## 2015-06-20 ENCOUNTER — Other Ambulatory Visit: Payer: Self-pay | Admitting: Pharmacist

## 2015-06-20 NOTE — Patient Outreach (Signed)
Christian [name] is a 80 y.o. male referred to pharmacy for medication assistance. Spoke with patient 2 weeks ago regarding his having entered the "donut hole" with his insurance and having difficulty affording his medications, particularly his Invokana and Januvia.   At that time sent an InBasket message to Care Management Assistant Damita Rhodie asking her to assist patient with completing these Extra Help and Patient Assistance Applications and called the patient's PCP, Dena Billet to let her know that he has entered the "donut hole" and was currently unable to afford some of his medications as a result and that [facility] will be helping him to complete the Extra Help application and patient assistance programs.  Note that per EPIC note from Care Management Assistant [place], she completed Extra Help application with the patient on 06/13/15 and initiated the patient assistance application both Invokana and Januvia at that time. Per [name], he received both of these applications on Monday and is currently working on completing them. Patient requests the phone number for Damita. Provide patient with this number. Patient also reports that he has received samples of the Invokana and Januvia from his doctor's office.   Patient reports that he has no further questions for me at this time. Confirm that patient has my phone number. Let patient know that I will call to follow up with him in about 3 weeks.  [name], PharmD Clinical Pharmacist [place] Management [phone]

## 2015-06-26 NOTE — Patient Outreach (Signed)
[place] [facility]) Care Management  06/26/2015  Christian [name] 03-12-1935 [id number]   Telephone outreach to patient,returning his call. Patient stated that he received a decision from social security saying he's been denied for extra help. I requested that he mail me a copy of the decision letter along with the patient assistance application forms that he needed to sign so I can send off to the patient assistance programs for processing.  Thomasena Vandenheuvel L. Thane Age, [place] Care Management Assistant

## 2015-07-05 ENCOUNTER — Other Ambulatory Visit: Payer: Self-pay

## 2015-07-05 NOTE — Patient Outreach (Signed)
[place] [name] [facility]) Care Management  [place]  07/05/2015   COBE VINEY 1935-06-01 [id number]  Subjective:  Telephone outreach to patient.  He reports he is doing ok.  He reports his blood sugar ranging 150-160.  Patient reports he has gone to the eye doctor on yesterday for routine eye exam.  Patient continues to have chronic back pain.  Patient is taking Vicodin as needed about 1 per day. However, patient reports he will talk to physician on the next visit about possibly increasing Vicodin to 10 mg as one tablet does not take the edge off.  Discussed with patient importance of pain management,  following diabetic diet, and knowing A1c.  He verbalized understanding.    Objective:   Current Medications:  Current Outpatient Prescriptions  Medication Sig Dispense Refill  . acetaminophen (TYLENOL) 500 MG tablet Take 1,000 mg by mouth every 8 (eight) hours as needed for mild pain or moderate pain (back pain).    Marland Kitchen aspirin EC 81 MG tablet Take 81 mg by mouth daily.      Marland Kitchen aspirin-sod bicarb-citric acid (ALKA-SELTZER) 325 MG TBEF tablet Take 325 mg by mouth daily as needed (morning congestion).    . cholecalciferol (VITAMIN D) 1000 UNITS tablet Take 1,000 Units by mouth daily.      . Cyanocobalamin (B-12 PO) Take 1 tablet by mouth daily.    . fish oil-omega-3 fatty acids 1000 MG capsule Take 3 g by mouth 2 (two) times daily.      Marland Kitchen gabapentin (NEURONTIN) 600 MG tablet TAKE ONE TABLET BY MOUTH THREE TIMES DAILY 90 tablet 2  . glipiZIDE (GLUCOTROL XL) 10 MG 24 hr tablet Take 1 tablet (10 mg total) by mouth daily with breakfast. 30 tablet 5  . Glucose Blood (BLOOD GLUCOSE TEST STRIPS) STRP Dispense based on patient and insurance preference. Use to monitor FSBS 2x daily. Dx: E11.9 100 each 11  . HYDROcodone-acetaminophen (NORCO/VICODIN) 5-325 MG per tablet Take 1 tablet by mouth every 6 (six) hours as needed. 30 tablet 0  . JANUVIA 100 MG tablet TAKE ONE TABLET BY MOUTH ONCE  DAILY 30 tablet 2  . lisinopril (PRINIVIL,ZESTRIL) 10 MG tablet Take 1 tablet (10 mg total) by mouth daily. 90 tablet 1  . metFORMIN (GLUCOPHAGE) 1000 MG tablet TAKE ONE TABLET BY MOUTH TWICE DAILY WITH  MEALS 60 tablet 5  . Multiple Vitamin (MULITIVITAMIN WITH MINERALS) TABS Take 1 tablet by mouth daily.      . niacin (NIASPAN) 500 MG CR tablet Take 1 tablet (500 mg total) by mouth at bedtime. 30 tablet 5  . potassium gluconate 595 MG TABS tablet Take 595 mg by mouth daily.    . simvastatin (ZOCOR) 40 MG tablet Take 1 tablet (40 mg total) by mouth daily with breakfast. 90 tablet 1  . canagliflozin (INVOKANA) 300 MG TABS tablet Take 300 mg by mouth daily before breakfast. (Patient not taking: Reported on 06/07/2015) 30 tablet 5   No current facility-administered medications for this visit.    Functional Status:  In your present state of health, do you have any difficulty performing the following activities: 06/07/2015 11/14/2014  Hearing? N N  Vision? N N  Difficulty concentrating or making decisions? N N  Walking or climbing stairs? N Y  Dressing or bathing? N N  Doing errands, shopping? N N  Preparing Food and eating ? N -  Using the Toilet? N -  In the past six months, have you accidently leaked urine?  N -  Do you have problems with loss of bowel control? N -  Managing your Medications? N -  Managing your Finances? N -  Housekeeping or managing your Housekeeping? N -    Fall/Depression Screening: PHQ 2/9 Scores 07/05/2015 06/07/2015 05/03/2015 12/07/2013  PHQ - 2 Score 0 0 0 6  PHQ- 9 Score - - - 15    Assessment: Patient continues to benefit from health coach outreach for disease management.  Plan:  [facility] CM Care Plan Problem One        Most Recent Value   Care Plan Problem One  Knowledge deficit diabetes   Role Documenting the Problem One  Health Coach   Care Plan for Problem One  Active   THN Long Term Goal (31-90 days)  Patient will decrease A1c by 0.5 points within 90 days   THN  Long Term Goal Start Date  06/07/15   Interventions for Problem One Long Term Goal  Discussed importance of A1c. Encouraged patient to discuss A1c goal with physician.    THN CM Short Term Goal #1 (0-30 days)  Patient will report making eye appointment within the next 30 days   THN CM Short Term Goal #1 Start Date  06/07/15   [facility] CM Short Term Goal #1 Met Date  07/05/15   Interventions for Short Term Goal #1  Patient had eye appointment 07-04-15   THN CM Short Term Goal #2 (0-30 days)  Patient will be able to report maintaining low carbohydrate diet within 30 days   THN CM Short Term Goal #2 Start Date  07/05/15 [goal continued]   Interventions for Short Term Goal #2  Reviewed importance of portion control and limiting carbohydrates.       RN Health Coach will contact patient within one month and patient agrees to next outreach.    Jone Baseman, RN, MSN [place] [phone]

## 2015-07-12 NOTE — Patient Outreach (Signed)
[place] [facility]) Care Management  07/12/2015  Christian [name] 1935/02/07 [id number]   Telephone outreach to patient to inform him that I need all the pages sent to him from social security stating they denied him for extra help assistance. He states he will locate paperwork and mail to me.  Vence Lalor L. Jakim Drapeau, [place] Care Management Assistant

## 2015-07-16 ENCOUNTER — Ambulatory Visit: Payer: PPO | Admitting: Physician Assistant

## 2015-07-18 ENCOUNTER — Other Ambulatory Visit: Payer: Self-pay | Admitting: Pharmacist

## 2015-07-18 NOTE — Patient Outreach (Signed)
Called to follow up with patient about his patient assistance applications. Patient does not answer and no voicemail is available. Will call Mr. Hayduk back again next week.  [name], PharmD Clinical Pharmacist [place] Management [phone]

## 2015-07-19 NOTE — Patient Outreach (Signed)
[place] [facility]) Care Management  07/19/2015  Christian [name] 09-30-1935 [id number]   Patient assistance applications for [employer] was faxed and [employer] was mailed on 07/19/2015 to the programs for processing. I will follow up with Mr. Salay in 3 weeks.  Frieda Arnall L. Darsh Vandevoort, [place] Care Management Assistant

## 2015-07-26 ENCOUNTER — Other Ambulatory Visit: Payer: Self-pay

## 2015-07-26 NOTE — Patient Outreach (Signed)
[place] [facility]) Care Management  [place]  August 20, 202016   GORDON VANDUNK 1934-10-31 [id number]  Subjective: Telephone call to patient for monthly outreach.  Patient reports that he is doing good.  Patient states he has sent back information pertaining to drug assistance.  Patient reports that he has an appointment with his primary doctor on 08-06-15 and will get his flu vaccine when he goes.     Diabetes: Patient reports that his blood sugars are ranging 150-190.  However, patient reports blood sugar dropping yesterday as he did not eat as he slept most of the day.  Discussed importance of eating regularly while taking diabetic medication.  He verbalized understanding.  Discussed with patient A1c and talking with physician about goals of A1c.  Coached on diabetic diet and limiting carbohydrates.  He verbalized understanding.    Objective:   Current Medications:  Current Outpatient Prescriptions  Medication Sig Dispense Refill  . acetaminophen (TYLENOL) 500 MG tablet Take 1,000 mg by mouth every 8 (eight) hours as needed for mild pain or moderate pain (back pain).    Marland Kitchen aspirin EC 81 MG tablet Take 81 mg by mouth daily.      Marland Kitchen aspirin-sod bicarb-citric acid (ALKA-SELTZER) 325 MG TBEF tablet Take 325 mg by mouth daily as needed (morning congestion).    . cholecalciferol (VITAMIN D) 1000 UNITS tablet Take 1,000 Units by mouth daily.      . Cyanocobalamin (B-12 PO) Take 1 tablet by mouth daily.    . fish oil-omega-3 fatty acids 1000 MG capsule Take 3 g by mouth 2 (two) times daily.      Marland Kitchen gabapentin (NEURONTIN) 600 MG tablet TAKE ONE TABLET BY MOUTH THREE TIMES DAILY 90 tablet 2  . glipiZIDE (GLUCOTROL XL) 10 MG 24 hr tablet Take 1 tablet (10 mg total) by mouth daily with breakfast. 30 tablet 5  . Glucose Blood (BLOOD GLUCOSE TEST STRIPS) STRP Dispense based on patient and insurance preference. Use to monitor FSBS 2x daily. Dx: E11.9 100 each 11  .  HYDROcodone-acetaminophen (NORCO/VICODIN) 5-325 MG per tablet Take 1 tablet by mouth every 6 (six) hours as needed. 30 tablet 0  . JANUVIA 100 MG tablet TAKE ONE TABLET BY MOUTH ONCE DAILY 30 tablet 2  . lisinopril (PRINIVIL,ZESTRIL) 10 MG tablet Take 1 tablet (10 mg total) by mouth daily. 90 tablet 1  . metFORMIN (GLUCOPHAGE) 1000 MG tablet TAKE ONE TABLET BY MOUTH TWICE DAILY WITH  MEALS 60 tablet 5  . Multiple Vitamin (MULITIVITAMIN WITH MINERALS) TABS Take 1 tablet by mouth daily.      . niacin (NIASPAN) 500 MG CR tablet Take 1 tablet (500 mg total) by mouth at bedtime. 30 tablet 5  . potassium gluconate 595 MG TABS tablet Take 595 mg by mouth daily.    . simvastatin (ZOCOR) 40 MG tablet Take 1 tablet (40 mg total) by mouth daily with breakfast. 90 tablet 1  . canagliflozin (INVOKANA) 300 MG TABS tablet Take 300 mg by mouth daily before breakfast. (Patient not taking: Reported on 06/07/2015) 30 tablet 5   No current facility-administered medications for this visit.    Functional Status:  In your present state of health, do you have any difficulty performing the following activities: 06/07/2015 11/14/2014  Hearing? N N  Vision? N N  Difficulty concentrating or making decisions? N N  Walking or climbing stairs? N Y  Dressing or bathing? N N  Doing errands, shopping? N N  Conservation officer, nature and  eating ? N -  Using the Toilet? N -  In the past six months, have you accidently leaked urine? N -  Do you have problems with loss of bowel control? N -  Managing your Medications? N -  Managing your Finances? N -  Housekeeping or managing your Housekeeping? N -    Fall/Depression Screening: PHQ 2/9 Scores 2020/12/614 07/05/2015 06/07/2015 05/03/2015 12/07/2013  PHQ - 2 Score 0 0 0 0 6  PHQ- 9 Score - - - - 15    Assessment: Patient continues to benefit from health coach outreach for disease management.   Plan:  [facility] CM Care Plan Problem One        Most Recent Value   Care Plan Problem One  Knowledge  deficit diabetes   Role Documenting the Problem One  Health Coach   Care Plan for Problem One  Active   THN Long Term Goal (31-90 days)  Patient will decrease A1c by 0.5 points within 90 days   THN Long Term Goal Start Date  06/07/15   Interventions for Problem One Long Term Goal  discussed A1c and patient talking with physician about A1c goals.     THN CM Short Term Goal #1 (0-30 days)  Patient will report making eye appointment within the next 30 days   THN CM Short Term Goal #1 Start Date  06/07/15   [facility] CM Short Term Goal #1 Met Date  07/05/15   Interventions for Short Term Goal #1  Patient had eye appointment 07-04-15   THN CM Short Term Goal #2 (0-30 days)  Patient will be able to report maintaining low carbohydrate diet within 30 days   THN CM Short Term Goal #2 Start Date  07/26/15 [goal continued]   Interventions for Short Term Goal #2  Reemphasized with patient the importance of limiting breads, potatoes, and rice.     THN CM Short Term Goal #3 (0-30 days)  Patient will report eating regular meals within 30 days.     THN CM Short Term Goal #3 Start Date  07/26/15   Interventions for Short [place] Goal #3  Discused with patient importance of eating while taking diabetic medication to avoid hypoglycemic episodes.       RN Health Coach will contact patient within one month and patient agrees to next outreach.    Jone Baseman, RN, MSN [place] [phone]

## 2015-07-27 ENCOUNTER — Other Ambulatory Visit: Payer: Self-pay | Admitting: Physician Assistant

## 2015-07-27 NOTE — Telephone Encounter (Signed)
Medication refilled per protocol. 

## 2015-08-01 ENCOUNTER — Other Ambulatory Visit: Payer: Self-pay | Admitting: Physician Assistant

## 2015-08-01 NOTE — Telephone Encounter (Signed)
Medication refilled per protocol. 

## 2015-08-03 ENCOUNTER — Other Ambulatory Visit: Payer: Self-pay | Admitting: Physician Assistant

## 2015-08-03 NOTE — Telephone Encounter (Signed)
Refill appropriate and filled per protocol. 

## 2015-08-06 ENCOUNTER — Ambulatory Visit (INDEPENDENT_AMBULATORY_CARE_PROVIDER_SITE_OTHER): Payer: PPO | Admitting: Physician Assistant

## 2015-08-06 ENCOUNTER — Other Ambulatory Visit: Payer: Self-pay | Admitting: Pharmacist

## 2015-08-06 ENCOUNTER — Encounter: Payer: Self-pay | Admitting: Family Medicine

## 2015-08-06 ENCOUNTER — Encounter: Payer: Self-pay | Admitting: Physician Assistant

## 2015-08-06 VITALS — BP 122/64 | HR 72 | Temp 97.7°F | Resp 18 | Wt 203.0 lb

## 2015-08-06 DIAGNOSIS — M5106 Intervertebral disc disorders with myelopathy, lumbar region: Secondary | ICD-10-CM | POA: Diagnosis not present

## 2015-08-06 DIAGNOSIS — Z961 Presence of intraocular lens: Secondary | ICD-10-CM | POA: Diagnosis not present

## 2015-08-06 DIAGNOSIS — E1142 Type 2 diabetes mellitus with diabetic polyneuropathy: Secondary | ICD-10-CM

## 2015-08-06 DIAGNOSIS — R600 Localized edema: Secondary | ICD-10-CM | POA: Diagnosis not present

## 2015-08-06 DIAGNOSIS — Z23 Encounter for immunization: Secondary | ICD-10-CM

## 2015-08-06 DIAGNOSIS — Z9889 Other specified postprocedural states: Secondary | ICD-10-CM

## 2015-08-06 DIAGNOSIS — E7800 Pure hypercholesterolemia, unspecified: Secondary | ICD-10-CM

## 2015-08-06 DIAGNOSIS — E1165 Type 2 diabetes mellitus with hyperglycemia: Secondary | ICD-10-CM

## 2015-08-06 DIAGNOSIS — I10 Essential (primary) hypertension: Secondary | ICD-10-CM

## 2015-08-06 LAB — HEMOGLOBIN A1C, FINGERSTICK: HEMOGLOBIN A1C, FINGERSTICK: 6.5 % — AB (ref <5.7)

## 2015-08-06 MED ORDER — HYDROCODONE-ACETAMINOPHEN 10-325 MG PO TABS: 1.0000 | ORAL_TABLET | Freq: Three times a day (TID) | ORAL | Status: DC | PRN

## 2015-08-06 NOTE — Patient Outreach (Signed)
Called to follow up with patient about his patient assistance applications. Patient does not answer and no voicemail is available. Per EPIC note from Care Management Assistant [place], patient assistance application for [employer] [place] was faxed and for [employer] was mailed on 07/19/2015 to the programs for processing.   Will follow up again in about 2 weeks.   [name], PharmD Clinical Pharmacist [place] Management [phone]

## 2015-08-06 NOTE — Progress Notes (Addendum)
Patient ID: Christian [name] MRN: [id number], DOB: January 18, 1935, 80 y.o. Date of Encounter: @DATE @  Chief Complaint:  Chief Complaint  Patient presents with  . routine check up    is fasting    HPI: 80 y.o. year old white male  presents to discuss Diabetes.  Hypertension: Taking meds as directed. No adverse effects.  Hyperlipidemia: On Zocor 40mg  and on Niaspan. No myalgias or other adverse medication effects.  Diabetes: On metformin, Glucotrol, Januvia, Invokana. 03/2014 he was having lower extremity edema so Actos was stopped and was changed to [place].  At OV 04/12/2015--is that he tries to check his blood sugar every morning but sometimes it's every 2 or 3 days. Even fasting morning he is getting high readings recently in the 200s sometimes even up to 300. Says that he has been "on the go ". Says that he is "not eating like are to ". Says that he has been working trying to get a vehicle to where it will pass inspection. Says that he has had been working on things around the house. Says that he "has been eating biscuits, potpie, sausage/ egg/ cheese croissant biscuits, french fries--all these things I know I shouldn't be eating". Asked exactly why this has changed but other than just being busy with these projects around the house he cannot give me any other answer. He has lived alone for 20 years. Says that he had been cooking healthier foods but says that he "hates cooking".  At 04/12/2015 OV, labs revealed HgbA1C 8.9.  Also Creatinine up to 1.36.  At that time increased the Glucotrol XL to 10 mg daily. Told him to document blood sugar readings and bring that in to visit in 3 weeks.  He comes in for f/u OV 05/03/2015. He brings blood sugar readings. Has the following for fasting morning readings: 229, 245, 166, 222, 240, 183, 197, 210, 164, 289, 212, 232, 999, 265, 208, 194, 171, 219, 212 "PM Readings": 117, 190, 172, 123, 218, and 96, 165, 232, 42, 249, 202, 215, 250, 220, 188,  247, 145.  At visit 78/28/2016 discussed with him the fact that we are running out of medication options and the fact that his sugar is not controlled.  Also discussed that the higher his sugar is the worse his kidney number will be.  Discussed that if his kidney numbers continue to rise will have to stop metformin and will have to decrease Januvia which is going to make his blood sugar even worse. Discussed his diet and the diet that he reported 04/12/2015. Also, I discussed the fact that he had lived alone for 20 years and asked if he had always been eating this way or if his diet recently worsened. He says that for the most part he has always eaten this way and really his diet has not changed much. Discussed then, if this is the case, that it's just that his pancreas is not working as well as it had in the past and that pancreatic function is gradually worsening. Discussed his breakfast biscuits and the fact that the sausage egg cheese has minimal carbohydrate but if he could just cut out the bread part-- or at least decrease the amount of bread that he is eating with that. Discussed french fries and the fact that this is potatoes which his carbohydrates and this needs to be cut out. Discussed potpie having crust which is basically bread/carbohydrate and this needs to be cut out. He says that he does  like beans and chicken. Discussed that he can eat as much of this as he wants. Gave him another carbohydrate handout to remind him the foods that he needs to be avoiding/limiting as much as possible.  He states that the Januvia cost him $45. Today I gave him 28 sample tablets to help save him some money.  Today I printed in AVS and marked the Glucotrol XL 10 mg for him to make sure that he has increased to that dose as directed at the time of labs 04/12/2015.  Also at today's visit 05/03/15 I am adding Invokana. We had some samples of the 100 mg dose so I given him #10 of these to start taking one each  morning. I have sent in a prescription for the 300 mg strength and he will start taking that prescription strength after he finishes up the samples. (Will f/u with him at next OV to find out how much this COSTS him and whether he can afford this--cannot use savings card--Medicare) He is to work on his diet as well as these medication changes. Explained to him that if we cannot get his sugar controlled with this, that he will need to take Lantus--- he says that he did use the Lantus pens in the past and is aware of them. He will have follow-up visit here in 3 months to reassess.  At [place] 08/06/2015: He reports that he was not able to afford the [place].IS NOT TAKING INVOKANA. States that the Januvia is extremely expensive and the Invokana was even more expensive and he absolutely cannot afford to pay for both of these. He does bring in blood sugar log forms today. He has been checking fasting readings every morning. Also has been checking either before dinner or 2 hours after dinner. For the month of September fasting readings have been: 156, 165, 129, 174, 112, 125, 167, 169, 184, 170, 183, 171. These continue that all readings are similar. He does have a few readings lower such as 105, 94. However most are 145/167 or 187 range. The month of October fasting morning readings are: 142, 173, 225, 189, 169, 120, 176. Most of these continue similar to these and most are in the 160 range. Readings before dinner: 124, 169, 145. Most of these readings are in that kind of range. A few that are lower closer to 109. A couple readings even down to 85. 2 hours after dinner: 149, 169, 181. Most are in this range. Some are up at around 202 12. Some down to 143 and 127.  ADDENDUM added 08/23/2015:  Received notice from pharmaceutical company---for Invokana---Patient Assistance---They are NOT able to provide him with Invokana---Also, there are notes from [facility] in [place].  At visit with me  12/20/14 was here for a  postoperative visit after having lumbar laminectomy 11/22/14. He has done well with that surgery with no complications. At that [place], was  requesting a refill on his hydrocodone. Has the medicine bottle with him which was given when he left the rehabilitation facility. The date was 12/13/14 on his prescription bottle for #30. He says that he only uses these occasionally when he had absolutely has to. However says that he does occasionally have significant pain and wants to have some pain medicine on hand to use if needed. At [place] 08/06/2015--he says that he needs a refill on his hydrocodone. However he says that he needs the dose increased from 5 mg to 10 mg. Says that in general/on average he takes one  per day. Some days doesn't have to take any. However when he does take it he feels no effect when he takes just 1. Has to take 2 to get pain relief. Requesting refill for the 10 mg dose.  06/14/2015---RN Health Coach--sent me a message that they are now following patient. Since then he has multiple notes in [place] from [facility].  No other complaints or concerns today.  Past Medical History  Diagnosis Date  . Hypercholesterolemia   . Diabetic neuropathy ([place])   . Skin cancer of arm 04/20/2013    left upper arm  . Spinal stenosis 05/17/2014    L3-L4   . 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 HTN (hypertension) 03/03/2013  . Diabetes mellitus     Type 2  . History of kidney stones   . Urgency of urination   . Arthritis      Home Meds:  Outpatient Prescriptions Prior to Visit  Medication Sig Dispense Refill  . acetaminophen (TYLENOL) 500 MG tablet Take 1,000 mg by mouth every 8 (eight) hours as needed for mild pain or moderate pain (back pain).    Marland Kitchen aspirin EC 81 MG tablet Take 81 mg by mouth daily.      Marland Kitchen aspirin-sod bicarb-citric acid (ALKA-SELTZER) 325 MG TBEF tablet Take 325 mg by mouth daily as needed (morning congestion).    . cholecalciferol (VITAMIN D) 1000 UNITS tablet Take 1,000 Units by mouth  daily.      . Cyanocobalamin (B-12 PO) Take 1 tablet by mouth daily.    . fish oil-omega-3 fatty acids 1000 MG capsule Take 3 g by mouth 2 (two) times daily.      Marland Kitchen gabapentin (NEURONTIN) 600 MG tablet TAKE ONE TABLET BY MOUTH THREE TIMES DAILY 90 tablet 2  . glipiZIDE (GLUCOTROL) 10 MG tablet TAKE ONE TABLET BY MOUTH TWICE DAILY BEFORE A MEAL 60 tablet 0  . Glucose Blood (BLOOD GLUCOSE TEST STRIPS) STRP Dispense based on patient and insurance preference. Use to monitor FSBS 2x daily. Dx: E11.9 100 each 11  . JANUVIA 100 MG tablet TAKE ONE TABLET BY MOUTH ONCE DAILY 30 tablet 5  . lisinopril (PRINIVIL,ZESTRIL) 10 MG tablet Take 1 tablet (10 mg total) by mouth daily. 90 tablet 1  . metFORMIN (GLUCOPHAGE) 1000 MG tablet TAKE ONE TABLET BY MOUTH TWICE DAILY WITH  MEALS 60 tablet 5  . Multiple Vitamin (MULITIVITAMIN WITH MINERALS) TABS Take 1 tablet by mouth daily.      . niacin (NIASPAN) 500 MG CR tablet Take 1 tablet (500 mg total) by mouth at bedtime. 30 tablet 5  . potassium gluconate 595 MG TABS tablet Take 595 mg by mouth daily.    . simvastatin (ZOCOR) 40 MG tablet Take 1 tablet (40 mg total) by mouth daily with breakfast. 90 tablet 1  . HYDROcodone-acetaminophen (NORCO/VICODIN) 5-325 MG per tablet Take 1 tablet by mouth every 6 (six) hours as needed. 30 tablet 0  . canagliflozin (INVOKANA) 300 MG TABS tablet Take 300 mg by mouth daily before breakfast. (Patient not taking: Reported on 06/07/2015) 30 tablet 5  . glipiZIDE (GLUCOTROL) 10 MG tablet TAKE ONE TABLET BY MOUTH TWICE DAILY BEFORE A MEAL 60 tablet 0   No facility-administered medications prior to visit.    Allergies:  Allergies  Allergen Reactions  . Actos [Pioglitazone]     LE Edema--03/2014    Social History   Social History  . Marital Status: Divorced    Spouse Name: N/A  . Number of Children: N/A  .  Years of Education: N/A   Occupational History  . Not on file.   Social History Main Topics  . Smoking status: Former  Smoker    Quit date: 03/04/2003  . Smokeless tobacco: Never Used  . Alcohol Use: No  . Drug Use: No  . Sexual Activity: Not on file   Other Topics Concern  . Not on file   Social History Narrative    History reviewed. No pertinent family history.   Review of Systems:  See HPI for pertinent ROS. All other ROS negative.    Physical Exam: Blood pressure 122/64, pulse 72, temperature 97.7 F (36.5 C), temperature source Oral, resp. rate 18, weight 203 lb (92.08 kg)., Body mass index is 30.87 kg/(m^2). General: WM. Abdominal Obesity--Moderate. Appears in no acute distress. Neck: Supple. No thyromegaly. No lymphadenopathy.No carotid bruit.  Lungs: Clear bilaterally to auscultation without wheezes, rales, or rhonchi. Breathing is unlabored. Heart: RRR with S1 S2. No murmurs, rubs, or gallops. Abdomen: Soft, non-tender, non-distended with normoactive bowel sounds. No hepatomegaly. No rebound/guarding. No obvious abdominal masses. Musculoskeletal:  Strength and tone normal for age. Extremities/Skin: Warm and dry. 1+ - 2+ pitting edema at ankles bilaterally. No edema higher than this level.  Neuro: Alert and oriented X 3. Moves all extremities spontaneously. Gait is normal. CNII-XII grossly in tact. Psych:  Responds to questions appropriately with a normal affect. Diabetic foot exam: Inspection: He has a lot of scaly skin. However no open wound or sores. Sensation is intact. 2+Left DP pulse. 1+ Right DP pulse.   No palpable posterior tibial pulses bilaterally.      ASSESSMENT AND PLAN:  80 y.o. year old male with     ---------------------------NOTE---ADDENDUM ADDED 06/14/2015---- RECEIVED NOTE IN EPIC FROM RN HEALTH COACH---SEE HER NOTE DOCUMENTED IN EPIC--- ---SHE WILL CONT TO F/U WITH PT-------   1. Diabetes   04/12/2015--Increased Glucotrol XL to 10mg  QD. AT OV 05/03/2015---Added Invokana 08/06/2015: He reports that he was not able to afford the Invokana.IS NOT TAKING  INVOKANA. States that the Januvia is extremely expensive and the Invokana was even more expensive and he absolutely cannot afford to pay for both of these. ADDENDUM added 08/23/2015:  Received notice from pharmaceutical company---for Invokana---Patient Assistance---They are NOT able to provide him with Invokana---Also, there are notes from [facility] in [place]. H/O LE Edema with Actos  Watch Kidney Function regarding Metformin and Januvia  At future OVs, will give Januvia samples when they are available, to keep down cost for pt.   - Hemoglobin A1C----this was run while the patient was here in the office on 08/06/15. Surprisingly, his A1c was excellent!!!  He states that he has stopped the biscuits and has stopped bread and has made some diet changes. Told him to continue current treatment.   Microalbumin was performed 03/13/2014. Repeat 04/12/2015. Diabetic foot exam documented and quality metrics on 01/10/2015.   He does have annual eye exam.  On ASA 81mg  QD On statin. On ACE inhibitor.  Has had Pneumovax 23 in past. Had Prevnar 13-- 12/2013.  2. Hypercholesterolemia On Zocor and Niaspan. Lipid panel excellent at last check. Recheck 04/12/2015.   3. Essential hypertension Blood Pressure controlled-- noted that today 04/12/2015 blood pressure a little low but also noted that at my last visit 01/10/15 his blood pressure was a little high-- therefore, will continue current medicines the same and continue to monitor. On ACE inhibitor. Check BMET.  4. H/O  Bilateral lower extremity edema - hydrochlorothiazide (MICROZIDE) 12.5 MG capsule; Take  1 capsule (12.5 mg total) by mouth daily.  Dispense: 30 capsule; Refill: 5--- says he is no longer having lower extremity edema and is just using this when necessary.  5. S/P lumbar laminectomy At visit 08/06/15 he is requesting increased dose of hydrocodone from 5 mg to 10 mg. See history of present illness for detail. At the visit on 08/06/15 I discussed that  this medication is addicting and discussed my concern of increasing the dose. Discussed just increasing it to 7.5 mg. However he is very reluctant to do this and states that he needs to go up to the 10 mg in order to get pain relief. Verifies that he takes either none some days or only one on the other days. Rx given for #30+0 refill. Will monitor use and make sure he is not using more than 1 per day.  6. screening PSA: Normal 07/2011. Will not repeat given advanced age of 89.  62. screening colonoscopy: At recent office visits we have discussed that this was due.  At visit 06/2014 he says that he needs to get his back pain control prior to scheduling this. Given age 70, can probably stop these--will discuss with pt Discussed colonoscpoy with patient at visit 04/12/15. He does seem interested in wanting to have continued follow-up colonoscopies. However at today's visit he just says it to much is going on and for [place] to rediscuss it at his next visit once things settle down.  7. Immunizations: Pneumonia vaccine: Received Pneumovax here 05/01/2008. Updated with Prevnar 13 12/2013. Tetanus: Given here 07/23/2011 Zostavax: He checked with his insurance-- cost would be $200 patient defers. Influenza Vaccine: Given here 08/06/2015  Regular office visit 3 months. Sooner if needed.    Signed, [address] [place], [place], [facility] 08/06/2015 9:16 AM

## 2015-08-15 ENCOUNTER — Other Ambulatory Visit: Payer: Self-pay | Admitting: Physician Assistant

## 2015-08-16 NOTE — Telephone Encounter (Signed)
Medication refilled per protocol. 

## 2015-08-22 ENCOUNTER — Other Ambulatory Visit: Payer: Self-pay | Admitting: Pharmacist

## 2015-08-22 NOTE — Patient Outreach (Signed)
Called to follow up with patient about his patient assistance applications. Christian [name] reports that he recently got letters back about the patient assistance applications for both Invokana and Januvia and that he was denied patient assistance for both as he "made too much money". Reports that his PCP knows that he has not been taking his Invokana and that she has been giving him samples of the Januvia, which he has been taking. Reports that he will let his PCP know and keep using these samples until the end of the year.  Patient reports that he has no further questions and has my contact information. Will let Care Management Assistant Damita Rhodie know that Christian [name] has been denied for the patient assistance and will stop following patient for now.  [name], PharmD Clinical Pharmacist [place] Management [phone]

## 2015-08-23 ENCOUNTER — Other Ambulatory Visit: Payer: Self-pay

## 2015-08-23 NOTE — Patient Outreach (Signed)
[facility]) Care Management  08/23/2015  Christian [name] 1935/06/15 [id number]   Notification received from [name], PharmD regarding patient assistance applications submitted on patient's behalf. Patient reported he was denied patient assistance for his medications through [employer] due to income eligibility. Pharmacy assistance needs have been met and I am removing myself from the care team. I am happy to assist in the future with any pharmacy assistance needs that may arise.  Christian [name], [place] Care Management Assistant

## 2015-08-23 NOTE — Patient Outreach (Signed)
[facility]) Care Management  [place]  08/23/2015   Christian [name] May 28, 1935 [id number]  Subjective: Telephone call to patient for monthly outreach.  Patient reports he is doing pretty good.  Patient saw primary doctor since last conversation and had his flu vaccine.  Patient reports visit went well.  Patient shares he also went to the eye doctor for annual exam and he was recommended to use reading glasses.    Diabetes: Patient reports his A1c was 6.5 down from last reading 8.9.  Patient states doctor was very pleased.  Today patient blood sugar was 148.  Coached patient on eating in moderation and limiting carbohydrates and sweets.  He verbalized understanding.    Objective:   Current Medications:  Current Outpatient Prescriptions  Medication Sig Dispense Refill  . acetaminophen (TYLENOL) 500 MG tablet Take 1,000 mg by mouth every 8 (eight) hours as needed for mild pain or moderate pain (back pain).    Marland Kitchen aspirin EC 81 MG tablet Take 81 mg by mouth daily.      Marland Kitchen aspirin-sod bicarb-citric acid (ALKA-SELTZER) 325 MG TBEF tablet Take 325 mg by mouth daily as needed (morning congestion).    . cholecalciferol (VITAMIN D) 1000 UNITS tablet Take 1,000 Units by mouth daily.      . Cyanocobalamin (B-12 PO) Take 1 tablet by mouth daily.    . fish oil-omega-3 fatty acids 1000 MG capsule Take 3 g by mouth 2 (two) times daily.      Marland Kitchen gabapentin (NEURONTIN) 600 MG tablet TAKE ONE TABLET BY MOUTH THREE TIMES DAILY 90 tablet 2  . glipiZIDE (GLUCOTROL) 10 MG tablet TAKE ONE TABLET BY MOUTH TWICE DAILY BEFORE A MEAL 60 tablet 0  . Glucose Blood (BLOOD GLUCOSE TEST STRIPS) STRP Dispense based on patient and insurance preference. Use to monitor FSBS 2x daily. Dx: E11.9 100 each 11  . HYDROcodone-acetaminophen (NORCO) 10-325 MG tablet Take 1 tablet by mouth every 8 (eight) hours as needed. 30 tablet 0  . JANUVIA 100 MG tablet TAKE ONE TABLET BY MOUTH ONCE DAILY 30 tablet 5  .  lisinopril (PRINIVIL,ZESTRIL) 10 MG tablet Take 1 tablet (10 mg total) by mouth daily. 90 tablet 1  . metFORMIN (GLUCOPHAGE) 1000 MG tablet TAKE ONE TABLET BY MOUTH TWICE DAILY WITH  MEALS 60 tablet 5  . Multiple Vitamin (MULITIVITAMIN WITH MINERALS) TABS Take 1 tablet by mouth daily.      . niacin (NIASPAN) 500 MG CR tablet TAKE ONE TABLET BY MOUTH AT BEDTIME 90 tablet 1  . potassium gluconate 595 MG TABS tablet Take 595 mg by mouth daily.    . simvastatin (ZOCOR) 40 MG tablet TAKE ONE TABLET BY MOUTH ONCE DAILY WITH  BREAKFAST 90 tablet 1   No current facility-administered medications for this visit.    Functional Status:  In your present state of health, do you have any difficulty performing the following activities: 06/07/2015 11/14/2014  Hearing? N N  Vision? N N  Difficulty concentrating or making decisions? N N  Walking or climbing stairs? N Y  Dressing or bathing? N N  Doing errands, shopping? N N  Preparing Food and eating ? N -  Using the Toilet? N -  In the past six months, have you accidently leaked urine? N -  Do you have problems with loss of bowel control? N -  Managing your Medications? N -  Managing your Finances? N -  Housekeeping or managing your Housekeeping? N -  Fall/Depression Screening: PHQ 2/9 Scores 08/23/2015 12-29-202016 07/05/2015 06/07/2015 05/03/2015 12/07/2013  PHQ - 2 Score 0 0 0 0 0 6  PHQ- 9 Score - - - - - 15    Assessment: Patient continues to benefit from health coach outreach for disease management.    Plan:  [facility] CM Care Plan Problem One        Most Recent Value   Care Plan Problem One  Knowledge deficit diabetes   Role Documenting the Problem One  Health Coach   Care Plan for Problem One  Active   THN Long Term Goal (31-90 days)  Patient will decrease A1c by 0.5 points within 90 days   THN Long Term Goal Start Date  06/07/15   [facility] Long Term Goal Met Date  08/23/15   Interventions for Problem One Long Term Goal  Patient A1c 6.5   THN CM Short  Term Goal #1 (0-30 days)  Patient will report making eye appointment within the next 30 days   THN CM Short Term Goal #1 Start Date  06/07/15   [facility] CM Short Term Goal #1 Met Date  07/05/15   Interventions for Short Term Goal #1  Patient had eye appointment 07-04-15   THN CM Short Term Goal #2 (0-30 days)  Patient will be able to report maintaining low carbohydrate diet within 30 days   THN CM Short Term Goal #2 Start Date  08/23/15 [goal continued]   Interventions for Short Term Goal #2  RN Health coach reiterated importance of low carbohydrates and limiting sweets.     THN CM Short Term Goal #3 (0-30 days)  Patient will report eating regular meals within 30 days.     THN CM Short Term Goal #3 Start Date  07/26/15   [facility] CM Short Term Goal #3 Met Date  08/23/15   Interventions for Short Tern Goal #3  Discussed with patient importance of eating while taking diabetic medication to avoid hypoglycemic episodes.       RN Health Coach will contact patient within one month and patient agrees to next outreach.    Jone Baseman, RN, MSN [place] [phone]

## 2015-09-06 ENCOUNTER — Other Ambulatory Visit: Payer: Self-pay | Admitting: Family Medicine

## 2015-09-06 NOTE — Telephone Encounter (Signed)
Medication refilled per protocol. 

## 2015-09-18 ENCOUNTER — Other Ambulatory Visit: Payer: Self-pay

## 2015-09-18 NOTE — Patient Outreach (Signed)
[facility]) Care Management  [place]  09/18/2015   Christian [name] 05/01/1935 [id number]  Subjective: Telephone call to patient for monthly outreach.  Patient reports he is doing good. Denies hospital admits or ED visits.  Patient reports blood sugars ranging 80's to 160's in the morning.  Patient asked about carbohydrate items.  Discussed with patient items high in carbohydrates that he eats.  Will send carbohydrate sheet to patient for review.    Objective:   Current Medications:  Current Outpatient Prescriptions  Medication Sig Dispense Refill  . acetaminophen (TYLENOL) 500 MG tablet Take 1,000 mg by mouth every 8 (eight) hours as needed for mild pain or moderate pain (back pain).    Marland Kitchen aspirin EC 81 MG tablet Take 81 mg by mouth daily.      Marland Kitchen aspirin-sod bicarb-citric acid (ALKA-SELTZER) 325 MG TBEF tablet Take 325 mg by mouth daily as needed (morning congestion).    . cholecalciferol (VITAMIN D) 1000 UNITS tablet Take 1,000 Units by mouth daily.      . Cyanocobalamin (B-12 PO) Take 1 tablet by mouth daily.    . fish oil-omega-3 fatty acids 1000 MG capsule Take 3 g by mouth 2 (two) times daily.      Marland Kitchen gabapentin (NEURONTIN) 600 MG tablet TAKE ONE TABLET BY MOUTH THREE TIMES DAILY 90 tablet 2  . glipiZIDE (GLUCOTROL) 10 MG tablet TAKE ONE TABLET BY MOUTH TWICE DAILY BEFORE  A  MEAL 180 tablet 0  . Glucose Blood (BLOOD GLUCOSE TEST STRIPS) STRP Dispense based on patient and insurance preference. Use to monitor FSBS 2x daily. Dx: E11.9 100 each 11  . HYDROcodone-acetaminophen (NORCO) 10-325 MG tablet Take 1 tablet by mouth every 8 (eight) hours as needed. 30 tablet 0  . JANUVIA 100 MG tablet TAKE ONE TABLET BY MOUTH ONCE DAILY 30 tablet 5  . lisinopril (PRINIVIL,ZESTRIL) 10 MG tablet Take 1 tablet (10 mg total) by mouth daily. 90 tablet 1  . metFORMIN (GLUCOPHAGE) 1000 MG tablet TAKE ONE TABLET BY MOUTH TWICE DAILY WITH  MEALS 60 tablet 5  . Multiple Vitamin  (MULITIVITAMIN WITH MINERALS) TABS Take 1 tablet by mouth daily.      . niacin (NIASPAN) 500 MG CR tablet TAKE ONE TABLET BY MOUTH AT BEDTIME 90 tablet 1  . potassium gluconate 595 MG TABS tablet Take 595 mg by mouth daily.    . simvastatin (ZOCOR) 40 MG tablet TAKE ONE TABLET BY MOUTH ONCE DAILY WITH  BREAKFAST 90 tablet 1   No current facility-administered medications for this visit.    Functional Status:  In your present state of health, do you have any difficulty performing the following activities: 06/07/2015 11/14/2014  Hearing? N N  Vision? N N  Difficulty concentrating or making decisions? N N  Walking or climbing stairs? N Y  Dressing or bathing? N N  Doing errands, shopping? N N  Preparing Food and eating ? N -  Using the Toilet? N -  In the past six months, have you accidently leaked urine? N -  Do you have problems with loss of bowel control? N -  Managing your Medications? N -  Managing your Finances? N -  Housekeeping or managing your Housekeeping? N -    Fall/Depression Screening: PHQ 2/9 Scores 09/18/2015 08/23/2015 09/16/2015 07/05/2015 06/07/2015 05/03/2015 12/07/2013  PHQ - 2 Score 0 0 0 0 0 0 6  PHQ- 9 Score - - - - - - 15    Assessment:  Patient continues to benefit from health coach outreach for disease management.   Plan:  [facility] CM Care Plan Problem One        Most Recent Value   Care Plan Problem One  Knowledge deficit diabetes   Role Documenting the Problem One  Health Coach   Care Plan for Problem One  Active   THN Long Term Goal (31-90 days)  Patient will decrease A1c by 0.5 points within 90 days   THN Long Term Goal Start Date  06/07/15   [facility] Long Term Goal Met Date  08/23/15   Interventions for Problem One Long Term Goal  Patient A1c 6.5   THN CM Short Term Goal #1 (0-30 days)  Patient will report making eye appointment within the next 30 days   THN CM Short Term Goal #1 Start Date  06/07/15   [facility] CM Short Term Goal #1 Met Date  07/05/15   Interventions  for Short Term Goal #1  Patient had eye appointment 07-04-15   THN CM Short Term Goal #2 (0-30 days)  Patient will be able to report maintaining low carbohydrate diet within 30 days   THN CM Short Term Goal #2 Start Date  09/18/15 [goal continued]   Interventions for Short Term Goal #2  [place] reviewed carbohydrate count on items that patient reports he eats.     THN CM Short Term Goal #3 (0-30 days)  Patient will report eating regular meals within 30 days.     THN CM Short Term Goal #3 Start Date  07/26/15   [facility] CM Short Term Goal #3 Met Date  08/23/15   Interventions for Short [place]ern Goal #3  Discussed with patient importance of eating while taking diabetic medication to avoid hypoglycemic episodes.       RN Health Coach will send information on carbohydrates and new calendar.   RN Health Coach will contact patient within one month and patient agrees to next outreach.    Jone Baseman, RN, MSN [place] [phone]

## 2015-09-29 ENCOUNTER — Other Ambulatory Visit: Payer: Self-pay | Admitting: Physician Assistant

## 2015-10-02 NOTE — Telephone Encounter (Signed)
Medication refilled per protocol. 

## 2015-10-15 ENCOUNTER — Other Ambulatory Visit: Payer: Self-pay

## 2015-10-15 NOTE — Patient Outreach (Signed)
[facility]) Care Management  [place]  10/15/2015   Christian [name] 1935-05-08 [id number]  Subjective: Telephone call to patient for monthly phone call .  Patient reports he is doing good just snowed in.  Patient reports he has what he needs.  Patient next primary care appointment 11-07-15.   Diabetes: Patient reports that he had misplaced his glucometer but he found it.  He reports his blood sugar was 150 this morning. Patient reports however that he has had some sugars up to 200.  Discussed diabetic diet and watching carbohydrates.  He verbalized understanding.  Objective:   Current Medications:  Current Outpatient Prescriptions  Medication Sig Dispense Refill  . acetaminophen (TYLENOL) 500 MG tablet Take 1,000 mg by mouth every 8 (eight) hours as needed for mild pain or moderate pain (back pain).    Marland Kitchen aspirin EC 81 MG tablet Take 81 mg by mouth daily.      Marland Kitchen aspirin-sod bicarb-citric acid (ALKA-SELTZER) 325 MG TBEF tablet Take 325 mg by mouth daily as needed (morning congestion).    . cholecalciferol (VITAMIN D) 1000 UNITS tablet Take 1,000 Units by mouth daily.      . Cyanocobalamin (B-12 PO) Take 1 tablet by mouth daily.    . fish oil-omega-3 fatty acids 1000 MG capsule Take 3 g by mouth 2 (two) times daily.      Marland Kitchen gabapentin (NEURONTIN) 600 MG tablet TAKE ONE TABLET BY MOUTH THREE TIMES DAILY 90 tablet 1  . glipiZIDE (GLUCOTROL) 10 MG tablet TAKE ONE TABLET BY MOUTH TWICE DAILY BEFORE  A  MEAL 180 tablet 0  . Glucose Blood (BLOOD GLUCOSE TEST STRIPS) STRP Dispense based on patient and insurance preference. Use to monitor FSBS 2x daily. Dx: E11.9 100 each 11  . HYDROcodone-acetaminophen (NORCO) 10-325 MG tablet Take 1 tablet by mouth every 8 (eight) hours as needed. 30 tablet 0  . JANUVIA 100 MG tablet TAKE ONE TABLET BY MOUTH ONCE DAILY 30 tablet 5  . lisinopril (PRINIVIL,ZESTRIL) 10 MG tablet Take 1 tablet (10 mg total) by mouth daily. 90 tablet 1  .  metFORMIN (GLUCOPHAGE) 1000 MG tablet TAKE ONE TABLET BY MOUTH TWICE DAILY WITH  MEALS 60 tablet 5  . potassium gluconate 595 MG TABS tablet Take 595 mg by mouth daily.    . simvastatin (ZOCOR) 40 MG tablet TAKE ONE TABLET BY MOUTH ONCE DAILY WITH  BREAKFAST 90 tablet 1  . Multiple Vitamin (MULITIVITAMIN WITH MINERALS) TABS Take 1 tablet by mouth daily.      . niacin (NIASPAN) 500 MG CR tablet TAKE ONE TABLET BY MOUTH AT BEDTIME 90 tablet 1   No current facility-administered medications for this visit.    Functional Status:  In your present state of health, do you have any difficulty performing the following activities: 06/07/2015 11/14/2014  Hearing? N N  Vision? N N  Difficulty concentrating or making decisions? N N  Walking or climbing stairs? N Y  Dressing or bathing? N N  Doing errands, shopping? N N  Preparing Food and eating ? N -  Using the Toilet? N -  In the past six months, have you accidently leaked urine? N -  Do you have problems with loss of bowel control? N -  Managing your Medications? N -  Managing your Finances? N -  Housekeeping or managing your Housekeeping? N -    Fall/Depression Screening: PHQ 2/9 Scores 10/15/2015 09/18/2015 08/23/2015 2020-02-2015 07/05/2015 06/07/2015 05/03/2015  PHQ - 2 Score  0 0 0 0 0 0 0  PHQ- 9 Score - - - - - - -    Assessment: Patient continues to benefit from health coach outreach for disease management.  Plan:  [facility] CM Care Plan Problem One        Most Recent Value   Care Plan Problem One  Knowledge deficit diabetes   Role Documenting the Problem One  [employer] for Problem One  Active   THN Long Term Goal (31-90 days)  Patient will keep blood sugars less than 175 withih the next 90 days.     THN Long Term Goal Start Date  10/15/15   Interventions for Problem One Long Term Goal  Discussed with patient goal of keeping blood sugar less than 175.  Discussed with patient limiting carbohydrates.     THN CM Short Term Goal #2  (0-30 days)  Patient will be able to report maintaining low carbohydrate diet within 30 days   THN CM Short Term Goal #2 Start Date  10/15/15 Barrie Folk continued]   Interventions for Short Term Goal #2  [place] discussed importance of watching carbohydrates.       RN Health Coach will contact patient within one month and patient agrees to next outreach.    Jone Baseman, RN, MSN [place] [phone]

## 2015-11-07 ENCOUNTER — Encounter: Payer: Self-pay | Admitting: Physician Assistant

## 2015-11-07 ENCOUNTER — Ambulatory Visit (INDEPENDENT_AMBULATORY_CARE_PROVIDER_SITE_OTHER): Payer: PPO | Admitting: Physician Assistant

## 2015-11-07 ENCOUNTER — Other Ambulatory Visit: Payer: Self-pay | Admitting: Family Medicine

## 2015-11-07 VITALS — BP 142/70 | HR 64 | Temp 98.3°F | Resp 18 | Wt 203.0 lb

## 2015-11-07 DIAGNOSIS — I10 Essential (primary) hypertension: Secondary | ICD-10-CM

## 2015-11-07 DIAGNOSIS — E1165 Type 2 diabetes mellitus with hyperglycemia: Secondary | ICD-10-CM

## 2015-11-07 DIAGNOSIS — E7800 Pure hypercholesterolemia, unspecified: Secondary | ICD-10-CM

## 2015-11-07 DIAGNOSIS — E1142 Type 2 diabetes mellitus with diabetic polyneuropathy: Secondary | ICD-10-CM | POA: Diagnosis not present

## 2015-11-07 LAB — COMPLETE METABOLIC PANEL WITH GFR
ALBUMIN: 4.1 g/dL (ref 3.6–5.1)
ALK PHOS: 47 U/L (ref 40–115)
ALT: 19 U/L (ref 9–46)
AST: 21 U/L (ref 10–35)
BILIRUBIN TOTAL: 0.5 mg/dL (ref 0.2–1.2)
BUN: 16 mg/dL (ref 7–25)
CO2: 26 mmol/L (ref 20–31)
Calcium: 9.5 mg/dL (ref 8.6–10.3)
Chloride: 105 mmol/L (ref 98–110)
Creat: 1.04 mg/dL (ref 0.70–1.18)
GFR, EST NON AFRICAN AMERICAN: 68 mL/min (ref >=60)
GFR, Est African American: 79 mL/min (ref >=60)
GLUCOSE: 165 mg/dL — AB (ref 70–99)
Potassium: 4.4 mmol/L (ref 3.5–5.3)
SODIUM: 140 mmol/L (ref 135–146)
TOTAL PROTEIN: 6.5 g/dL (ref 6.1–8.1)

## 2015-11-07 LAB — HEMOGLOBIN A1C
HEMOGLOBIN A1C: 6.3 % — AB (ref <5.7)
Mean Plasma Glucose: 134 mg/dL — ABNORMAL HIGH (ref <117)

## 2015-11-07 LAB — LIPID PANEL
Cholesterol: 150 mg/dL (ref 125–200)
HDL: 42 mg/dL (ref >=40)
LDL Cholesterol: 87 mg/dL (ref <130)
Total CHOL/HDL Ratio: 3.6 Ratio (ref <=5.0)
Triglycerides: 104 mg/dL (ref <150)
VLDL: 21 mg/dL (ref <30)

## 2015-11-07 MED ORDER — BLOOD GLUCOSE TEST VI STRP: ORAL_STRIP | Status: DC

## 2015-11-07 NOTE — Progress Notes (Signed)
Patient ID: WYETT FORCUCCI MRN: [id number], DOB: [birth year], 81 y.o. Date of Encounter: @DATE @  Chief Complaint:  Chief Complaint  Patient presents with  . 3 mth check up    is fasting    HPI: 81 y.o. year old white male  presents to discuss Diabetes.  Hypertension: Taking meds as directed. No adverse effects.  Hyperlipidemia: On Zocor 40mg  and on Niaspan. No myalgias or other adverse medication effects.  Diabetes: On metformin, Glucotrol, Januvia. 03/2014 he was having lower extremity edema so Actos was stopped and was changed to [place]. Also --have added Invokana, but unable to afford---further info below--  At OV 04/12/2015--is that he tries to check his blood sugar every morning but sometimes it's every 2 or 3 days. Even fasting morning he is getting high readings recently in the 200s sometimes even up to 300. Says that he has been "on the go ". Says that he is "not eating like are to ". Says that he has been working trying to get a vehicle to where it will pass inspection. Says that he has had been working on things around the house. Says that he "has been eating biscuits, potpie, sausage/ egg/ cheese croissant biscuits, french fries--all these things I know I shouldn't be eating". Asked exactly why this has changed but other than just being busy with these projects around the house he cannot give me any other answer. He has lived alone for 20 years. Says that he had been cooking healthier foods but says that he "hates cooking".  At 04/12/2015 OV, labs revealed HgbA1C 8.9.  Also Creatinine up to 1.36.  At that time increased the Glucotrol XL to 10 mg daily. Told him to document blood sugar readings and bring that in to visit in 3 weeks.  He comes in for f/u OV 05/03/2015. He brings blood sugar readings. Has the following for fasting morning readings: 229, 245, 166, 222, 240, 183, 197, 210, 164, 289, 212, 232, 999, 265, 208, 194, 171, 219, 212 "PM Readings": 117, 190, 172,  123, 218, and 96, 165, 232, 42, 249, 202, 215, 250, 220, 188, 247, 145.  At visit 79/28/2016 discussed with him the fact that we are running out of medication options and the fact that his sugar is not controlled.  Also discussed that the higher his sugar is the worse his kidney number will be.  Discussed that if his kidney numbers continue to rise will have to stop metformin and will have to decrease Januvia which is going to make his blood sugar even worse. Discussed his diet and the diet that he reported 04/12/2015. Also, I discussed the fact that he had lived alone for 20 years and asked if he had always been eating this way or if his diet recently worsened. He says that for the most part he has always eaten this way and really his diet has not changed much. Discussed then, if this is the case, that it's just that his pancreas is not working as well as it had in the past and that pancreatic function is gradually worsening. Discussed his breakfast biscuits and the fact that the sausage egg cheese has minimal carbohydrate but if he could just cut out the bread part-- or at least decrease the amount of bread that he is eating with that. Discussed french fries and the fact that this is potatoes which his carbohydrates and this needs to be cut out. Discussed potpie having crust which is basically bread/carbohydrate and this  needs to be cut out. He says that he does like beans and chicken. Discussed that he can eat as much of this as he wants. Gave him another carbohydrate handout to remind him the foods that he needs to be avoiding/limiting as much as possible.  He states that the Januvia cost him $45. Today I gave him 28 sample tablets to help save him some money.  At visit 05/03/15 I added Invokana. We had some samples of the 100 mg dose so I given him #10 of these to start taking one each morning. I have sent in a prescription for the 300 mg strength and he will start taking that prescription strength  after he finishes up the samples. (Will f/u with him at next OV to find out how much this COSTS him and whether he can afford this--cannot use savings card--Medicare) He is to work on his diet as well as these medication changes. Explained to him that if we cannot get his sugar controlled with this, that he will need to take Lantus--- he says that he did use the Lantus pens in the past and is aware of them. He will have follow-up visit here in 3 months to reassess.  At [place] 08/06/2015: He reports that he was not able to afford the [place].IS NOT TAKING INVOKANA. States that the Januvia is extremely expensive and the Invokana was even more expensive and he absolutely cannot afford to pay for both of these. He does bring in blood sugar log forms today. He has been checking fasting readings every morning. Also has been checking either before dinner or 2 hours after dinner. For the month of September fasting readings have been: 156, 165, 129, 174, 112, 125, 167, 169, 184, 170, 183, 171. These continue that all readings are similar. He does have a few readings lower such as 105, 94. However most are 145/167 or 187 range. The month of October fasting morning readings are: 142, 173, 225, 189, 169, 120, 176. Most of these continue similar to these and most are in the 160 range. Readings before dinner: 124, 169, 145. Most of these readings are in that kind of range. A few that are lower closer to 109. A couple readings even down to 85. 2 hours after dinner: 149, 169, 181. Most are in this range. Some are up at around 202 12. Some down to 143 and 127.  ADDENDUM added 08/23/2015:  Received notice from pharmaceutical company---for Invokana---Patient Assistance---They are NOT able to provide him with Invokana---Also, there are notes from [facility] in [place].  At visit with me  79/16/16 was here for a postoperative visit after having lumbar laminectomy 11/22/14. He has done well with that surgery with no complications. At  that [place], was  requesting a refill on his hydrocodone. Has the medicine bottle with him which was given when he left the rehabilitation facility. The date was 12/13/14 on his prescription bottle for #30. He says that he only uses these occasionally when he had absolutely has to. However says that he does occasionally have significant pain and wants to have some pain medicine on hand to use if needed. At [place] 08/06/2015--he says that he needs a refill on his hydrocodone. However he says that he needs the dose increased from 5 mg to 10 mg. Says that in general/on average he takes one per day. Some days doesn't have to take any. However when he does take it he feels no effect when he takes just 1. Has to  take 2 to get pain relief. Requesting refill for the 10 mg dose.  06/14/2015---RN Health Coach--sent me a message that they are now following patient. Since then he has multiple notes in Epic from [facility].  11/07/2015: He brings in blood sugar log.  Is that he needs to check his sugar 3 times a day but is running out of strips and wants [place] to order it so that he can check it 3 times a day. Has the log for November December and January. [place] today has a reading for fasting morning and 2 hours after dinner. Occasionally has sugars documented at other times of day as well. For the month of January fasting readings are: 299, 207, 180, 191, 91, 222, 150, 257, 154, 163, 172, 148, 192, 147, 128, 168, 87, 170, 87, 179, 99, 154 2 hour after dinner:  168, 152, 189, 150, 135, 178, 177, 124, 174, 88, 161, 200, 184, 102, 65, 168, 166, 92, 171  Past Medical History  Diagnosis Date  . Hypercholesterolemia   . Diabetic neuropathy ([place])   . Skin cancer of arm 04/20/2013    left upper arm  . Spinal stenosis 05/17/2014    L3-L4   . 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 HTN (hypertension) 03/03/2013  . Diabetes mellitus     Type 2  . History of kidney stones   . Urgency of urination   . Arthritis      Home  Meds:  Outpatient Prescriptions Prior to Visit  Medication Sig Dispense Refill  . acetaminophen (TYLENOL) 500 MG tablet Take 1,000 mg by mouth every 8 (eight) hours as needed for mild pain or moderate pain (back pain).    Marland Kitchen aspirin EC 81 MG tablet Take 81 mg by mouth daily.      Marland Kitchen aspirin-sod bicarb-citric acid (ALKA-SELTZER) 325 MG TBEF tablet Take 325 mg by mouth daily as needed (morning congestion).    . cholecalciferol (VITAMIN D) 1000 UNITS tablet Take 1,000 Units by mouth daily.      . Cyanocobalamin (B-12 PO) Take 1 tablet by mouth daily.    . fish oil-omega-3 fatty acids 1000 MG capsule Take 3 g by mouth 2 (two) times daily.      Marland Kitchen gabapentin (NEURONTIN) 600 MG tablet TAKE ONE TABLET BY MOUTH THREE TIMES DAILY 90 tablet 1  . glipiZIDE (GLUCOTROL) 10 MG tablet TAKE ONE TABLET BY MOUTH TWICE DAILY BEFORE  A  MEAL 180 tablet 0  . Glucose Blood (BLOOD GLUCOSE TEST STRIPS) STRP Dispense based on patient and insurance preference. Use to monitor FSBS 2x daily. Dx: E11.9 100 each 11  . HYDROcodone-acetaminophen (NORCO) 10-325 MG tablet Take 1 tablet by mouth every 8 (eight) hours as needed. 30 tablet 0  . JANUVIA 100 MG tablet TAKE ONE TABLET BY MOUTH ONCE DAILY 30 tablet 5  . lisinopril (PRINIVIL,ZESTRIL) 10 MG tablet Take 1 tablet (10 mg total) by mouth daily. 90 tablet 1  . metFORMIN (GLUCOPHAGE) 1000 MG tablet TAKE ONE TABLET BY MOUTH TWICE DAILY WITH  MEALS 60 tablet 5  . Multiple Vitamin (MULITIVITAMIN WITH MINERALS) TABS Take 1 tablet by mouth daily.      . niacin (NIASPAN) 500 MG CR tablet TAKE ONE TABLET BY MOUTH AT BEDTIME 90 tablet 1  . potassium gluconate 595 MG TABS tablet Take 595 mg by mouth daily.    . simvastatin (ZOCOR) 40 MG tablet TAKE ONE TABLET BY MOUTH ONCE DAILY WITH  BREAKFAST 90 tablet 1   No facility-administered medications  prior to visit.    Allergies:  Allergies  Allergen Reactions  . Actos [Pioglitazone]     LE Edema--03/2014    Social History   Social  History  . Marital Status: Divorced    Spouse Name: N/A  . Number of Children: N/A  . Years of Education: N/A   Occupational History  . Not on file.   Social History Main Topics  . Smoking status: Former Smoker    Quit date: 03/04/2003  . Smokeless tobacco: Never Used  . Alcohol Use: No  . Drug Use: No  . Sexual Activity: Not on file   Other Topics Concern  . Not on file   Social History Narrative    History reviewed. No pertinent family history.   Review of Systems:  See HPI for pertinent ROS. All other ROS negative.    Physical Exam: Blood pressure 142/70, pulse 64, temperature 98.3 F (36.8 C), temperature source Oral, resp. rate 18, weight 203 lb (92.08 kg)., Body mass index is 30.87 kg/(m^2). General: WM. Abdominal Obesity--Moderate. Appears in no acute distress. Neck: Supple. No thyromegaly. No lymphadenopathy.No carotid bruit.  Lungs: Clear bilaterally to auscultation without wheezes, rales, or rhonchi. Breathing is unlabored. Heart: RRR with S1 S2. No murmurs, rubs, or gallops. Abdomen: Soft, non-tender, non-distended with normoactive bowel sounds. No hepatomegaly. No rebound/guarding. No obvious abdominal masses. Musculoskeletal:  Strength and tone normal for age. Extremities/Skin: Warm and dry. 1+ - 2+ pitting edema at ankles bilaterally. No edema higher than this level.  Neuro: Alert and oriented X 3. Moves all extremities spontaneously. Gait is normal. CNII-XII grossly in tact. Psych:  Responds to questions appropriately with a normal affect. Diabetic foot exam: Inspection: He has a lot of scaly skin. However no open wound or sores. Sensation is intact. 2+Left DP pulse. 1+ Right DP pulse.   No palpable posterior tibial pulses bilaterally.      ASSESSMENT AND PLAN:  81 y.o. year old male with     ---------------------------NOTE---ADDENDUM ADDED 06/14/2015---- RECEIVED NOTE IN EPIC FROM RN HEALTH COACH---SEE HER NOTE DOCUMENTED IN EPIC--- ---SHE WILL CONT  TO F/U WITH PT-------   1. Diabetes   04/12/2015--Increased Glucotrol XL to 10mg  QD. AT OV 05/03/2015---Added Invokana 08/06/2015: He reports that he was not able to afford the Invokana.IS NOT TAKING INVOKANA. States that the Januvia is extremely expensive and the Invokana was even more expensive and he absolutely cannot afford to pay for both of these. ADDENDUM added 08/23/2015:  Received notice from pharmaceutical company---for Invokana---Patient Assistance---They are NOT able to provide him with Invokana---Also, there are notes from [facility] in [place]. H/O LE Edema with Actos  Watch Kidney Function regarding Metformin and Januvia  At future OVs, will give Januvia samples when they are available, to keep down cost for pt.   - Hemoglobin A1C----this was run while the patient was here in the office on 08/06/15. Surprisingly, his A1c was excellent!!!  He states that he has stopped the biscuits and has stopped bread and has made some diet changes. Told him to continue current treatment.   Microalbumin was performed 03/13/2014. Repeat 04/12/2015. Diabetic foot exam documented and quality metrics on 01/10/2015.   He does have annual eye exam.  On ASA 81mg  QD On statin. On ACE inhibitor.  Has had Pneumovax 23 in past. Had Prevnar 13-- 12/2013.  2. Hypercholesterolemia On Zocor and Niaspan. Lipid panel excellent at last check. Recheck 04/12/2015.   3. Essential hypertension Blood Pressure controlled-- noted that  04/12/2015 blood pressure  a little low but also noted that at my last visit 01/10/15 his blood pressure was a little high-- therefore, will continue current medicines the same and continue to monitor. At Visit 11/07/15 blood pressure 142/70. However reviewed blood pressure 08/06/15 122/64. Therefore, will not change medicines and will continue meds the same.  On ACE inhibitor. Check BMET.  4. H/O  Bilateral lower extremity edema - hydrochlorothiazide (MICROZIDE) 12.5 MG capsule; Take 1  capsule (12.5 mg total) by mouth daily.  Dispense: 30 capsule; Refill: 5--- says he is no longer having lower extremity edema and is just using this when necessary.  5. S/P lumbar laminectomy At visit 08/06/15 he is requesting increased dose of hydrocodone from 5 mg to 10 mg. See history of present illness for detail. At the visit on 08/06/15 I discussed that this medication is addicting and discussed my concern of increasing the dose. Discussed just increasing it to 7.5 mg. However he is very reluctant to do this and states that he needs to go up to the 10 mg in order to get pain relief. Verifies that he takes either none some days or only one on the other days. Rx given for #30+0 refill. Will monitor use and make sure he is not using more than 1 per day.  6. screening PSA: Normal 07/2011. Will not repeat given advanced age of 71.  11. screening colonoscopy: At recent office visits we have discussed that this was due.  At visit 06/2014 he says that he needs to get his back pain control prior to scheduling this. Given age 73, can probably stop these--will discuss with pt Discussed colonoscpoy with patient at visit 04/12/15. He does seem interested in wanting to have continued follow-up colonoscopies. However at today's visit he just says it to much is going on and for [place] to rediscuss it at his next visit once things settle down.  7. Immunizations: Pneumonia vaccine: Received Pneumovax here 05/01/2008. Updated with Prevnar 13 12/2013. Tetanus: Given here 07/23/2011 Zostavax: He checked with his insurance-- cost would be $200 patient defers. Influenza Vaccine: Given here 08/06/2015  Regular office visit 3 months. Sooner if needed.    Marin Olp [place], [place], [facility] 11/07/2015 8:13 AM

## 2015-11-12 ENCOUNTER — Other Ambulatory Visit: Payer: Self-pay

## 2015-11-12 NOTE — Patient Outreach (Signed)
[name]) Care Management  [place]  11/12/2015   Christian [name] Oct 24, 1934 [id number]  Subjective: Telephone call to patient for monthly outreach.  Patient reports he is doing ok.  He reports he saw his primary doctor last week.  A1c down some to 6.3.  However, patient reports that his sugars are recently 170-180 range.  He reports he is checking them three times a day.  Discussed with patient carbohydrates and importance of limiting them.  Also discussed with patient how breading on food items count as carbohydrates as well.  He verbalized understanding.   Objective:   Current Medications:  Current Outpatient Prescriptions  Medication Sig Dispense Refill  . acetaminophen (TYLENOL) 500 MG tablet Take 1,000 mg by mouth every 8 (eight) hours as needed for mild pain or moderate pain (back pain).    Marland Kitchen aspirin EC 81 MG tablet Take 81 mg by mouth daily.      Marland Kitchen aspirin-sod bicarb-citric acid (ALKA-SELTZER) 325 MG TBEF tablet Take 325 mg by mouth daily as needed (morning congestion).    . cholecalciferol (VITAMIN D) 1000 UNITS tablet Take 1,000 Units by mouth daily.      . Cyanocobalamin (B-12 PO) Take 1 tablet by mouth daily.    . fish oil-omega-3 fatty acids 1000 MG capsule Take 3 g by mouth 2 (two) times daily.      Marland Kitchen gabapentin (NEURONTIN) 600 MG tablet TAKE ONE TABLET BY MOUTH THREE TIMES DAILY 90 tablet 1  . glipiZIDE (GLUCOTROL) 10 MG tablet TAKE ONE TABLET BY MOUTH TWICE DAILY BEFORE  A  MEAL 180 tablet 0  . Glucose Blood (BLOOD GLUCOSE TEST STRIPS) STRP Dispense based on patient and insurance preference. Use to monitor FSBS 3 x daily. Dx: E11.65 100 each 11  . HYDROcodone-acetaminophen (NORCO) 10-325 MG tablet Take 1 tablet by mouth every 8 (eight) hours as needed. 30 tablet 0  . JANUVIA 100 MG tablet TAKE ONE TABLET BY MOUTH ONCE DAILY 30 tablet 5  . lisinopril (PRINIVIL,ZESTRIL) 10 MG tablet Take 1 tablet (10 mg total) by mouth daily. 90 tablet 1  .  metFORMIN (GLUCOPHAGE) 1000 MG tablet TAKE ONE TABLET BY MOUTH TWICE DAILY WITH  MEALS 60 tablet 5  . Multiple Vitamin (MULITIVITAMIN WITH MINERALS) TABS Take 1 tablet by mouth daily.      . niacin (NIASPAN) 500 MG CR tablet TAKE ONE TABLET BY MOUTH AT BEDTIME 90 tablet 1  . potassium gluconate 595 MG TABS tablet Take 595 mg by mouth daily.    . simvastatin (ZOCOR) 40 MG tablet TAKE ONE TABLET BY MOUTH ONCE DAILY WITH  BREAKFAST 90 tablet 1   No current facility-administered medications for this visit.    Functional Status:  In your present state of health, do you have any difficulty performing the following activities: 06/07/2015 11/14/2014  Hearing? N N  Vision? N N  Difficulty concentrating or making decisions? N N  Walking or climbing stairs? N Y  Dressing or bathing? N N  Doing errands, shopping? N N  Preparing Food and eating ? N -  Using the Toilet? N -  In the past six months, have you accidently leaked urine? N -  Do you have problems with loss of bowel control? N -  Managing your Medications? N -  Managing your Finances? N -  Housekeeping or managing your Housekeeping? N -    Fall/Depression Screening: PHQ 2/9 Scores 11/12/2015 10/15/2015 09/18/2015 08/23/2015 2020/08/3015 07/05/2015 06/07/2015  PHQ - 2  Score 0 0 0 0 0 0 0  PHQ- 9 Score - - - - - - -    Assessment: Patient continues to benefit from health coach outreach for disease management and support.  Plan:  [facility] CM Care Plan Problem One        Most Recent Value   Care Plan Problem One  Knowledge deficit diabetes   Role Documenting the Problem One  Health [place] for Problem One  Active   THN Long Term Goal (31-90 days)  Patient will keep blood sugars less than 175 withih the next 90 days.     THN Long Term Goal Start Date  10/15/15   Interventions for Problem One Long Term Goal  Reviewed with patient goal of keeping blood sugar less than 175.  Reviewed with patient limiting carbohydrates.     THN CM Short Term  Goal #2 (0-30 days)  Patient will be able to report maintaining low carbohydrate diet within 30 days   THN CM Short Term Goal #2 Start Date  11/12/15 [goal continued]   Interventions for Short Term Goal #2  [place] reviewed the importance of watching carbohydrates.       RN Health Coach will contact patient within one month and patient agrees to next outreach.    Jone Baseman, RN, MSN [place] [phone]

## 2015-11-14 ENCOUNTER — Other Ambulatory Visit: Payer: Self-pay | Admitting: Physician Assistant

## 2015-11-15 ENCOUNTER — Other Ambulatory Visit: Payer: Self-pay | Admitting: Physician Assistant

## 2015-11-15 NOTE — Telephone Encounter (Signed)
Medication refilled per protocol. 

## 2015-11-15 NOTE — Telephone Encounter (Signed)
Refill appropriate and filled per protocol. 

## 2015-12-05 ENCOUNTER — Other Ambulatory Visit: Payer: Self-pay | Admitting: Physician Assistant

## 2015-12-05 NOTE — Telephone Encounter (Signed)
Refill appropriate and filled per protocol. 

## 2015-12-10 ENCOUNTER — Other Ambulatory Visit: Payer: Self-pay

## 2015-12-10 NOTE — Patient Outreach (Signed)
[facility]) Care Management  [place]  12/10/2015   Christian [name] 07-10-1935 [id number]  Subjective: Telephone call to patient for monthly outreach.  Patient reports that he is doing good.  Shares he was able to go out of town to [place] to the races.  He states he enjoyed his time but got a little off his diet. He reports that this morning that his blood sugar was 203.  Discussed with patient diabetic diet and limiting carbohydrates. He verbalized understanding.  No concerns.    Objective:   Current Medications:  Current Outpatient Prescriptions  Medication Sig Dispense Refill  . acetaminophen (TYLENOL) 500 MG tablet Take 1,000 mg by mouth every 8 (eight) hours as needed for mild pain or moderate pain (back pain).    Marland Kitchen aspirin EC 81 MG tablet Take 81 mg by mouth daily.      Marland Kitchen aspirin-sod bicarb-citric acid (ALKA-SELTZER) 325 MG TBEF tablet Take 325 mg by mouth daily as needed (morning congestion).    . cholecalciferol (VITAMIN D) 1000 UNITS tablet Take 1,000 Units by mouth daily.      . Cyanocobalamin (B-12 PO) Take 1 tablet by mouth daily.    . fish oil-omega-3 fatty acids 1000 MG capsule Take 3 g by mouth 2 (two) times daily.      Marland Kitchen gabapentin (NEURONTIN) 600 MG tablet TAKE ONE TABLET BY MOUTH THREE TIMES DAILY 90 tablet 0  . glipiZIDE (GLUCOTROL) 10 MG tablet TAKE ONE TABLET BY MOUTH TWICE DAILY BEFORE  A  MEAL 180 tablet 0  . Glucose Blood (BLOOD GLUCOSE TEST STRIPS) STRP Dispense based on patient and insurance preference. Use to monitor FSBS 3 x daily. Dx: E11.65 100 each 11  . HYDROcodone-acetaminophen (NORCO) 10-325 MG tablet Take 1 tablet by mouth every 8 (eight) hours as needed. 30 tablet 0  . JANUVIA 100 MG tablet TAKE ONE TABLET BY MOUTH ONCE DAILY 30 tablet 5  . lisinopril (PRINIVIL,ZESTRIL) 10 MG tablet TAKE ONE TABLET BY MOUTH ONCE DAILY 90 tablet 1  . lisinopril (PRINIVIL,ZESTRIL) 10 MG tablet TAKE ONE TABLET BY MOUTH ONCE DAILY 90 tablet 0  .  metFORMIN (GLUCOPHAGE) 1000 MG tablet TAKE ONE TABLET BY MOUTH TWICE DAILY WITH  MEALS 60 tablet 5  . Multiple Vitamin (MULITIVITAMIN WITH MINERALS) TABS Take 1 tablet by mouth daily.      . niacin (NIASPAN) 500 MG CR tablet TAKE ONE TABLET BY MOUTH AT BEDTIME 90 tablet 1  . potassium gluconate 595 MG TABS tablet Take 595 mg by mouth daily.    . simvastatin (ZOCOR) 40 MG tablet TAKE ONE TABLET BY MOUTH ONCE DAILY WITH  BREAKFAST 90 tablet 1   No current facility-administered medications for this visit.    Functional Status:  In your present state of health, do you have any difficulty performing the following activities: 06/07/2015  Hearing? N  Vision? N  Difficulty concentrating or making decisions? N  Walking or climbing stairs? N  Dressing or bathing? N  Doing errands, shopping? N  Preparing Food and eating ? N  Using the Toilet? N  In the past six months, have you accidently leaked urine? N  Do you have problems with loss of bowel control? N  Managing your Medications? N  Managing your Finances? N  Housekeeping or managing your Housekeeping? N    Fall/Depression Screening: PHQ 2/9 Scores 12/10/2015 11/12/2015 10/15/2015 09/18/2015 08/23/2015 03/16/2015 07/05/2015  PHQ - 2 Score 0 0 0 0 0 0 0  PHQ- 9 Score - - - - - - -    Assessment: Patient continues to benefit from health coach outreach for disease management and support.    Plan:  [facility] CM Care Plan Problem One        Most Recent Value   Care Plan Problem One  Knowledge deficit diabetes   Role Documenting the Problem One  Health [place] for Problem One  Active   THN Long Term Goal (31-90 days)  Patient will keep blood sugars less than 175 withih the next 90 days.     THN Long Term Goal Start Date  12/10/15   Interventions for Problem One [place] discussed with patient goal of keeping blood sugar less than 175.  Discussed with patient limiting carbohydrates.     THN CM Short Term Goal #2 (0-30  days)  Patient will be able to report maintaining low carbohydrate diet within 30 days   THN CM Short Term Goal #2 Start Date  12/10/15 Barrie Folk continued]   Interventions for Short Term Goal #2  [place] discussed the importance of watching carbohydrates.       RN Health Coach will contact patient within one month and patient agrees to next outreach.    Jone Baseman, RN, MSN [place] [phone]

## 2015-12-15 ENCOUNTER — Other Ambulatory Visit: Payer: Self-pay | Admitting: Physician Assistant

## 2015-12-17 NOTE — Telephone Encounter (Signed)
Medication refilled per protocol. 

## 2015-12-18 ENCOUNTER — Other Ambulatory Visit: Payer: Self-pay | Admitting: Physician Assistant

## 2015-12-18 NOTE — Telephone Encounter (Signed)
Refill appropriate and filled per protocol. 

## 2016-01-04 ENCOUNTER — Telehealth: Payer: Self-pay | Admitting: Physician Assistant

## 2016-01-04 MED ORDER — HYDROCODONE-ACETAMINOPHEN 10-325 MG PO TABS: 1.0000 | ORAL_TABLET | Freq: Three times a day (TID) | ORAL | Status: DC | PRN

## 2016-01-04 NOTE — Telephone Encounter (Signed)
LRF 08/06/15 #30   LOV 11/07/15  OK refill?

## 2016-01-04 NOTE — Telephone Encounter (Signed)
Approved for #[id number] 

## 2016-01-04 NOTE — Telephone Encounter (Signed)
Pt is requesting a refill of Hydrocodone.

## 2016-01-04 NOTE — Telephone Encounter (Signed)
Rx printed, pt can pick up Monday after provider signs.

## 2016-01-07 ENCOUNTER — Other Ambulatory Visit: Payer: Self-pay

## 2016-01-07 NOTE — Patient Outreach (Signed)
[facility]) Care Management  [place]  01/07/2016   Christian [name] 02/11/1935 [id number]  Subjective: Telephone call to patient for monthly call. Patient reports that he is doing good.  However,  He reports that his sugars are running in the 150-160 range.  Discussed with patient the importance of watching his carbohydrates and portion size.  He verbalized understanding.  Patient voices no concerns.    Objective:   Current Medications:  Current Outpatient Prescriptions  Medication Sig Dispense Refill  . acetaminophen (TYLENOL) 500 MG tablet Take 1,000 mg by mouth every 8 (eight) hours as needed for mild pain or moderate pain (back pain).    Marland Kitchen aspirin EC 81 MG tablet Take 81 mg by mouth daily.      Marland Kitchen aspirin-sod bicarb-citric acid (ALKA-SELTZER) 325 MG TBEF tablet Take 325 mg by mouth daily as needed (morning congestion).    . cholecalciferol (VITAMIN D) 1000 UNITS tablet Take 1,000 Units by mouth daily.      . Cyanocobalamin (B-12 PO) Take 1 tablet by mouth daily.    . fish oil-omega-3 fatty acids 1000 MG capsule Take 3 g by mouth 2 (two) times daily.      Marland Kitchen gabapentin (NEURONTIN) 600 MG tablet TAKE ONE TABLET BY MOUTH THREE TIMES DAILY 90 tablet 0  . glipiZIDE (GLUCOTROL) 10 MG tablet TAKE ONE TABLET BY MOUTH TWICE DAILY BEFORE A MEAL 180 tablet 3  . Glucose Blood (BLOOD GLUCOSE TEST STRIPS) STRP Dispense based on patient and insurance preference. Use to monitor FSBS 3 x daily. Dx: E11.65 100 each 11  . HYDROcodone-acetaminophen (NORCO) 10-325 MG tablet Take 1 tablet by mouth every 8 (eight) hours as needed. 30 tablet 0  . JANUVIA 100 MG tablet TAKE ONE TABLET BY MOUTH ONCE DAILY 30 tablet 5  . lisinopril (PRINIVIL,ZESTRIL) 10 MG tablet TAKE ONE TABLET BY MOUTH ONCE DAILY 90 tablet 1  . lisinopril (PRINIVIL,ZESTRIL) 10 MG tablet TAKE ONE TABLET BY MOUTH ONCE DAILY 90 tablet 0  . metFORMIN (GLUCOPHAGE) 1000 MG tablet TAKE ONE TABLET BY MOUTH TWICE DAILY WITH  MEALS 180 tablet 0  . Multiple Vitamin (MULITIVITAMIN WITH MINERALS) TABS Take 1 tablet by mouth daily.      . niacin (NIASPAN) 500 MG CR tablet TAKE ONE TABLET BY MOUTH AT BEDTIME 90 tablet 1  . potassium gluconate 595 MG TABS tablet Take 595 mg by mouth daily.    . simvastatin (ZOCOR) 40 MG tablet TAKE ONE TABLET BY MOUTH ONCE DAILY WITH  BREAKFAST 90 tablet 1   No current facility-administered medications for this visit.    Functional Status:  In your present state of health, do you have any difficulty performing the following activities: 06/07/2015  Hearing? N  Vision? N  Difficulty concentrating or making decisions? N  Walking or climbing stairs? N  Dressing or bathing? N  Doing errands, shopping? N  Preparing Food and eating ? N  Using the Toilet? N  In the past six months, have you accidently leaked urine? N  Do you have problems with loss of bowel control? N  Managing your Medications? N  Managing your Finances? N  Housekeeping or managing your Housekeeping? N    Fall/Depression Screening: PHQ 2/9 Scores 01/07/2016 12/10/2015 11/12/2015 10/15/2015 09/18/2015 08/23/2015 08/21/2015  PHQ - 2 Score 0 0 0 0 0 0 0  PHQ- 9 Score - - - - - - -    Assessment: Patient continues to benefit from health coach outreach for  disease management.  Plan:  [facility] CM Care Plan Problem One        Most Recent Value   Care Plan Problem One  Knowledge deficit diabetes   Role Documenting the Problem One  Health [place] for Problem One  Active   THN Long Term Goal (31-90 days)  Patient will keep blood sugars less than 175 withih the next 90 days.     THN Long Term Goal Start Date  12/10/15   Interventions for Problem One [place] reviewed with patient goal of keeping blood sugar less than 175.  Discussed with patient limiting carbohydrates.     THN CM Short Term Goal #2 (0-30 days)  Patient will be able to report maintaining low carbohydrate diet within 30 days   THN CM  Short Term Goal #2 Start Date  01/07/16 Barrie Folk continued]   Interventions for Short Term Goal #2  [place] reviewed the importance of watching carbohydrates.       RN Health Coach will contact patient within one month and patient agrees to next outreach.    Jone Baseman, RN, MSN [place] [phone]

## 2016-01-09 ENCOUNTER — Other Ambulatory Visit: Payer: Self-pay | Admitting: Physician Assistant

## 2016-01-09 NOTE — Telephone Encounter (Signed)
Refill appropriate and filled per protocol. 

## 2016-02-04 ENCOUNTER — Other Ambulatory Visit: Payer: Self-pay

## 2016-02-04 NOTE — Patient Outreach (Signed)
[facility]) Care Management  [place]  02/04/2016   FREDDICK MAJKUT 11/17/1934 [id number]  Subjective: Telephone call to patient for monthly call. He reports he is doing ok but reports that his sugars have been higher.  Patient reports highest sugar was 263 but this morning was 151. Patient reports he has been eating out more when trying to figure out why blood sugars are higher.  Discussed with patient how eating out can cause sugars to increase as we are not aware of how food has been prepared.  Also discussed trying to stay away from foods high in carbohydrates.  He verbalized understanding.  Patient reports that he sees primary doctor next week and knows that his A1c will be increased.    Objective:   Encounter Medications:  Outpatient Encounter Prescriptions as of 02/04/2016  Medication Sig  . acetaminophen (TYLENOL) 500 MG tablet Take 1,000 mg by mouth every 8 (eight) hours as needed for mild pain or moderate pain (back pain).  Marland Kitchen aspirin EC 81 MG tablet Take 81 mg by mouth daily.    Marland Kitchen aspirin-sod bicarb-citric acid (ALKA-SELTZER) 325 MG TBEF tablet Take 325 mg by mouth daily as needed (morning congestion).  . cholecalciferol (VITAMIN D) 1000 UNITS tablet Take 1,000 Units by mouth daily.    . Cyanocobalamin (B-12 PO) Take 1 tablet by mouth daily.  . fish oil-omega-3 fatty acids 1000 MG capsule Take 3 g by mouth 2 (two) times daily.    Marland Kitchen gabapentin (NEURONTIN) 600 MG tablet TAKE ONE TABLET BY MOUTH THREE TIMES DAILY  . glipiZIDE (GLUCOTROL) 10 MG tablet TAKE ONE TABLET BY MOUTH TWICE DAILY BEFORE A MEAL  . Glucose Blood (BLOOD GLUCOSE TEST STRIPS) STRP Dispense based on patient and insurance preference. Use to monitor FSBS 3 x daily. Dx: E11.65  . HYDROcodone-acetaminophen (NORCO) 10-325 MG tablet Take 1 tablet by mouth every 8 (eight) hours as needed.  Marland Kitchen JANUVIA 100 MG tablet TAKE ONE TABLET BY MOUTH ONCE DAILY  . lisinopril (PRINIVIL,ZESTRIL) 10 MG tablet TAKE  ONE TABLET BY MOUTH ONCE DAILY  . lisinopril (PRINIVIL,ZESTRIL) 10 MG tablet TAKE ONE TABLET BY MOUTH ONCE DAILY  . metFORMIN (GLUCOPHAGE) 1000 MG tablet TAKE ONE TABLET BY MOUTH TWICE DAILY WITH MEALS  . Multiple Vitamin (MULITIVITAMIN WITH MINERALS) TABS Take 1 tablet by mouth daily.    . niacin (NIASPAN) 500 MG CR tablet TAKE ONE TABLET BY MOUTH AT BEDTIME  . potassium gluconate 595 MG TABS tablet Take 595 mg by mouth daily.  . simvastatin (ZOCOR) 40 MG tablet TAKE ONE TABLET BY MOUTH ONCE DAILY WITH  BREAKFAST   No facility-administered encounter medications on file as of 02/04/2016.    Functional Status:  In your present state of health, do you have any difficulty performing the following activities: 06/07/2015  Hearing? N  Vision? N  Difficulty concentrating or making decisions? N  Walking or climbing stairs? N  Dressing or bathing? N  Doing errands, shopping? N  Preparing Food and eating ? N  Using the Toilet? N  In the past six months, have you accidently leaked urine? N  Do you have problems with loss of bowel control? N  Managing your Medications? N  Managing your Finances? N  Housekeeping or managing your Housekeeping? N    Fall/Depression Screening: PHQ 2/9 Scores 02/04/2016 01/07/2016 12/10/2015 11/12/2015 10/15/2015 09/18/2015 08/23/2015  PHQ - 2 Score 0 0 0 0 0 0 0  PHQ- 9 Score - - - - - - -  Assessment: Patient continues to benefit from health coach outreach for disease management and support.   Plan:  [facility] CM Care Plan Problem One        Most Recent Value   Care Plan Problem One  Knowledge deficit diabetes   Role Documenting the Problem One  Health [place] for Problem One  Active   THN Long Term Goal (31-90 days)  Patient will keep blood sugars less than 175 withih the next 90 days.     THN Long Term Goal Start Date  12/10/15 [goal continued]   Interventions for Problem One Long Term Goal  RN Health Coach discussed with patient goal of keeping blood sugar  less than 175.  Discussed with patient limiting carbohydrates.     THN CM Short Term Goal #2 (0-30 days)  Patient will be able to report maintaining low carbohydrate diet within 30 days   THN CM Short Term Goal #2 Start Date  02/04/16 [goal continued]   Interventions for Short Term Goal #2  [place] discussed the importance of watching carbohydrates.       RN Health Coach will contact patient within one month and patient agrees to next outreach.   Jone Baseman, RN, MSN [place] [phone]

## 2016-02-11 ENCOUNTER — Encounter: Payer: Self-pay | Admitting: Physician Assistant

## 2016-02-11 ENCOUNTER — Ambulatory Visit (INDEPENDENT_AMBULATORY_CARE_PROVIDER_SITE_OTHER): Payer: PPO | Admitting: Physician Assistant

## 2016-02-11 VITALS — BP 108/62 | HR 64 | Temp 98.0°F | Resp 18 | Wt 200.0 lb

## 2016-02-11 DIAGNOSIS — I10 Essential (primary) hypertension: Secondary | ICD-10-CM

## 2016-02-11 DIAGNOSIS — M545 Low back pain, unspecified: Secondary | ICD-10-CM

## 2016-02-11 DIAGNOSIS — E1165 Type 2 diabetes mellitus with hyperglycemia: Secondary | ICD-10-CM | POA: Diagnosis not present

## 2016-02-11 DIAGNOSIS — E7800 Pure hypercholesterolemia, unspecified: Secondary | ICD-10-CM | POA: Diagnosis not present

## 2016-02-11 DIAGNOSIS — E1142 Type 2 diabetes mellitus with diabetic polyneuropathy: Secondary | ICD-10-CM

## 2016-02-11 LAB — HEMOGLOBIN A1C, FINGERSTICK: Hgb A1C (fingerstick): 6.2 % — ABNORMAL HIGH (ref <5.7)

## 2016-02-11 NOTE — Progress Notes (Signed)
Patient ID: SHANDELL PAVELICH MRN: [id number], DOB: 17-Apr-1935, 81 y.o. Date of Encounter: @DATE @  Chief Complaint:  Chief Complaint  Patient presents with  . 6 mth check up    is fasting    HPI: 81 y.o. year old white male  presents to discuss Diabetes.  Hypertension: Taking meds as directed. No adverse effects.  Hyperlipidemia: On Zocor 40mg  and on Niaspan. No myalgias or other adverse medication effects.  Diabetes: On metformin, Glucotrol, Januvia. 03/2014 he was having lower extremity edema so Actos was stopped and was changed to [place]. Also --have added Invokana, but unable to afford---further info below--  At OV 04/12/2015--is that he tries to check his blood sugar every morning but sometimes it's every 2 or 3 days. Even fasting morning he is getting high readings recently in the 200s sometimes even up to 300. Says that he has been "on the go ". Says that he is "not eating like are to ". Says that he has been working trying to get a vehicle to where it will pass inspection. Says that he has had been working on things around the house. Says that he "has been eating biscuits, potpie, sausage/ egg/ cheese croissant biscuits, french fries--all these things I know I shouldn't be eating". Asked exactly why this has changed but other than just being busy with these projects around the house he cannot give me any other answer. He has lived alone for 20 years. Says that he had been cooking healthier foods but says that he "hates cooking".  At 04/12/2015 OV, labs revealed HgbA1C 8.9.  Also Creatinine up to 1.36.  At that time increased the Glucotrol XL to 10 mg daily. Told him to document blood sugar readings and bring that in to visit in 3 weeks.  He comes in for f/u OV 05/03/2015. He brings blood sugar readings. Has the following for fasting morning readings: 229, 245, 166, 222, 240, 183, 197, 210, 164, 289, 212, 232, 999, 265, 208, 194, 171, 219, 212 "PM Readings": 117, 190, 172,  123, 218, and 96, 165, 232, 42, 249, 202, 215, 250, 220, 188, 247, 145.  At visit 05/03/2015 discussed with him the fact that we are running out of medication options and the fact that his sugar is not controlled.  Also discussed that the higher his sugar is the worse his kidney number will be.  Discussed that if his kidney numbers continue to rise will have to stop metformin and will have to decrease Januvia which is going to make his blood sugar even worse. Discussed his diet and the diet that he reported 04/12/2015. Also, I discussed the fact that he had lived alone for 20 years and asked if he had always been eating this way or if his diet recently worsened. He says that for the most part he has always eaten this way and really his diet has not changed much. Discussed then, if this is the case, that it's just that his pancreas is not working as well as it had in the past and that pancreatic function is gradually worsening. Discussed his breakfast biscuits and the fact that the sausage egg cheese has minimal carbohydrate but if he could just cut out the bread part-- or at least decrease the amount of bread that he is eating with that. Discussed french fries and the fact that this is potatoes which his carbohydrates and this needs to be cut out. Discussed potpie having crust which is basically bread/carbohydrate and this  needs to be cut out. He says that he does like beans and chicken. Discussed that he can eat as much of this as he wants. Gave him another carbohydrate handout to remind him the foods that he needs to be avoiding/limiting as much as possible.  He states that the Januvia cost him $45. Today I gave him 28 sample tablets to help save him some money.  At visit 05/03/15 I added Invokana. We had some samples of the 100 mg dose so I given him #10 of these to start taking one each morning. I have sent in a prescription for the 300 mg strength and he will start taking that prescription strength  after he finishes up the samples. (Will f/u with him at next OV to find out how much this COSTS him and whether he can afford this--cannot use savings card--Medicare) He is to work on his diet as well as these medication changes. Explained to him that if we cannot get his sugar controlled with this, that he will need to take Lantus--- he says that he did use the Lantus pens in the past and is aware of them. He will have follow-up visit here in 3 months to reassess.  At [place] 08/06/2015: He reports that he was not able to afford the [place].IS NOT TAKING INVOKANA. States that the Januvia is extremely expensive and the Invokana was even more expensive and he absolutely cannot afford to pay for both of these. He does bring in blood sugar log forms today. He has been checking fasting readings every morning. Also has been checking either before dinner or 2 hours after dinner. For the month of September fasting readings have been: 156, 165, 129, 174, 112, 125, 167, 169, 184, 170, 183, 171. These continue that all readings are similar. He does have a few readings lower such as 105, 94. However most are 145/167 or 187 range. The month of October fasting morning readings are: 142, 173, 225, 189, 169, 120, 176. Most of these continue similar to these and most are in the 160 range. Readings before dinner: 124, 169, 145. Most of these readings are in that kind of range. A few that are lower closer to 109. A couple readings even down to 85. 2 hours after dinner: 149, 169, 181. Most are in this range. Some are up at around 202 12. Some down to 143 and 127.  ADDENDUM added 08/23/2015:  Received notice from pharmaceutical company---for Invokana---Patient Assistance---They are NOT able to provide him with Invokana---Also, there are notes from [facility] in [place].  At visit with me  79/16/16 was here for a postoperative visit after having lumbar laminectomy 79/17/16. He has done well with that surgery with no complications. At  that [place], was  requesting a refill on his hydrocodone. Has the medicine bottle with him which was given when he left the rehabilitation facility. The date was 12/13/14 on his prescription bottle for #30. He says that he only uses these occasionally when he had absolutely has to. However says that he does occasionally have significant pain and wants to have some pain medicine on hand to use if needed. At [place] 08/06/2015--he says that he needs a refill on his hydrocodone. However he says that he needs the dose increased from 5 mg to 10 mg. Says that in general/on average he takes one per day. Some days doesn't have to take any. However when he does take it he feels no effect when he takes just 1. Has to  take 2 to get pain relief. Requesting refill for the 10 mg dose.  06/14/2015---RN Health Coach--sent me a message that they are now following patient. Since then he has multiple notes in Epic from [facility].  11/07/2015: He brings in blood sugar log.  Is that he needs to check his sugar 3 times a day but is running out of strips and wants [place] to order it so that he can check it 3 times a day. Has the log for November December and January. [place] today has a reading for fasting morning and 2 hours after dinner. Occasionally has sugars documented at other times of day as well. For the month of January fasting readings are: 299, 207, 180, 191, 91, 222, 150, 257, 154, 163, 172, 148, 192, 147, 128, 168, 87, 170, 87, 179, 99, 154 2 hour after dinner:  168, 152, 189, 150, 135, 178, 177, 124, 174, 88, 161, 200, 184, 102, 65, 168, 166, 92, 171  02/11/2016: BS Log Brought in: For month of April--he has been checking ~ 3 times a day on average.  Fastings: 166, 138, 185, 151, 174, 161. Lowest: 126, 128, 128.  Before Lunch: 90, 103, 140, 161, 169, 133 2 hours after lunch: 146, 185, 170,  Before Dinner: 100, 154, 120, 130, 73, 136 2 hour after dinner: 150, 167, 164, 144, 221, 157 No complint/concern at this OV  Past Medical  History  Diagnosis Date  . Hypercholesterolemia   . Diabetic neuropathy ([place])   . Skin cancer of arm 04/20/2013    left upper arm  . Spinal stenosis 05/17/2014    L3-L4   . 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 HTN (hypertension) 03/03/2013  . Diabetes mellitus     Type 2  . History of kidney stones   . Urgency of urination   . Arthritis      Home Meds:  Outpatient Prescriptions Prior to Visit  Medication Sig Dispense Refill  . acetaminophen (TYLENOL) 500 MG tablet Take 1,000 mg by mouth every 8 (eight) hours as needed for mild pain or moderate pain (back pain).    Marland Kitchen aspirin EC 81 MG tablet Take 81 mg by mouth daily.      Marland Kitchen aspirin-sod bicarb-citric acid (ALKA-SELTZER) 325 MG TBEF tablet Take 325 mg by mouth daily as needed (morning congestion).    . cholecalciferol (VITAMIN D) 1000 UNITS tablet Take 1,000 Units by mouth daily.      . Cyanocobalamin (B-12 PO) Take 1 tablet by mouth daily.    . fish oil-omega-3 fatty acids 1000 MG capsule Take 3 g by mouth 2 (two) times daily.      Marland Kitchen gabapentin (NEURONTIN) 600 MG tablet TAKE ONE TABLET BY MOUTH THREE TIMES DAILY 90 tablet 0  . glipiZIDE (GLUCOTROL) 10 MG tablet TAKE ONE TABLET BY MOUTH TWICE DAILY BEFORE A MEAL 180 tablet 3  . Glucose Blood (BLOOD GLUCOSE TEST STRIPS) STRP Dispense based on patient and insurance preference. Use to monitor FSBS 3 x daily. Dx: E11.65 100 each 11  . HYDROcodone-acetaminophen (NORCO) 10-325 MG tablet Take 1 tablet by mouth every 8 (eight) hours as needed. 30 tablet 0  . JANUVIA 100 MG tablet TAKE ONE TABLET BY MOUTH ONCE DAILY 30 tablet 5  . lisinopril (PRINIVIL,ZESTRIL) 10 MG tablet TAKE ONE TABLET BY MOUTH ONCE DAILY 90 tablet 1  . metFORMIN (GLUCOPHAGE) 1000 MG tablet TAKE ONE TABLET BY MOUTH TWICE DAILY WITH MEALS 180 tablet 0  . Multiple Vitamin (MULITIVITAMIN WITH MINERALS) TABS Take  1 tablet by mouth daily.      . niacin (NIASPAN) 500 MG CR tablet TAKE ONE TABLET BY MOUTH AT BEDTIME 90 tablet 1    . potassium gluconate 595 MG TABS tablet Take 595 mg by mouth daily.    . simvastatin (ZOCOR) 40 MG tablet TAKE ONE TABLET BY MOUTH ONCE DAILY WITH  BREAKFAST 90 tablet 1  . lisinopril (PRINIVIL,ZESTRIL) 10 MG tablet TAKE ONE TABLET BY MOUTH ONCE DAILY 90 tablet 0   No facility-administered medications prior to visit.    Allergies:  Allergies  Allergen Reactions  . Actos [Pioglitazone]     LE Edema--03/2014    Social History   Social History  . Marital Status: Divorced    Spouse Name: N/A  . Number of Children: N/A  . Years of Education: N/A   Occupational History  . Not on file.   Social History Main Topics  . Smoking status: Former Smoker    Quit date: 03/04/2003  . Smokeless tobacco: Never Used  . Alcohol Use: No  . Drug Use: No  . Sexual Activity: Not on file   Other Topics Concern  . Not on file   Social History Narrative    No family history on file.   Review of Systems:  See HPI for pertinent ROS. All other ROS negative.    Physical Exam: Blood pressure 108/62, pulse 64, temperature 98 F (36.7 C), temperature source Oral, resp. rate 18, weight 200 lb (90.719 kg)., Body mass index is 30.42 kg/(m^2). General: WM. Abdominal Obesity--Moderate. Appears in no acute distress. Neck: Supple. No thyromegaly. No lymphadenopathy.No carotid bruit.  Lungs: Clear bilaterally to auscultation without wheezes, rales, or rhonchi. Breathing is unlabored. Heart: RRR with S1 S2. No murmurs, rubs, or gallops. Abdomen: Soft, non-tender, non-distended with normoactive bowel sounds. No hepatomegaly. No rebound/guarding. No obvious abdominal masses. Musculoskeletal:  Strength and tone normal for age. Extremities/Skin: Warm and dry. 1+ - 2+ pitting edema at ankles bilaterally. No edema higher than this level.  Neuro: Alert and oriented X 3. Moves all extremities spontaneously. Gait is normal. CNII-XII grossly in tact. Psych:  Responds to questions appropriately with a normal  affect. Diabetic foot exam: Inspection: He has a lot of scaly skin. However no open wound or sores. Sensation is intact. 2+Left DP pulse. 1+ Right DP pulse.   No palpable posterior tibial pulses bilaterally.      ASSESSMENT AND PLAN:  81 y.o. year old male with     ---------------------------NOTE---ADDENDUM ADDED 06/14/2015---- RECEIVED NOTE IN EPIC FROM RN HEALTH COACH---SEE HER NOTE DOCUMENTED IN EPIC--- ---SHE WILL CONT TO F/U WITH PT-------   1. Diabetes   04/12/2015--Increased Glucotrol XL to 10mg  QD. AT OV 05/03/2015---Added Invokana 08/06/2015: He reports that he was not able to afford the Invokana.IS NOT TAKING INVOKANA. States that the Januvia is extremely expensive and the Invokana was even more expensive and he absolutely cannot afford to pay for both of these. ADDENDUM added 08/23/2015:  Received notice from pharmaceutical company---for Invokana---Patient Assistance---They are NOT able to provide him with Invokana---Also, there are notes from [facility] in [place]. H/O LE Edema with Actos  Watch Kidney Function regarding Metformin and Januvia  At future OVs, will give Januvia samples when they are available, to keep down cost for pt.   - Hemoglobin A1C----this was run while the patient was here in the office on 08/06/15. Surprisingly, his A1c was excellent!!!  He states that he has stopped the biscuits and has stopped bread and has  made some diet changes. Told him to continue current treatment.   Microalbumin was performed 03/13/2014. Repeat 04/12/2015. Diabetic foot exam documented and quality metrics on 01/10/2015.   He does have annual eye exam.  On ASA 81mg  QD On statin. On ACE inhibitor.  Has had Pneumovax 23 in past. Had Prevnar 13-- 12/2013.  2. Hypercholesterolemia On Zocor and Niaspan. Lipid panel excellent at last check. Recheck 04/12/2015.   3. Essential hypertension Blood Pressure controlled-- noted that  04/12/2015 blood pressure a little low but also noted that at  my last visit 01/10/15 his blood pressure was a little high-- therefore, will continue current medicines the same and continue to monitor. At Visit 11/07/15 blood pressure 142/70. However reviewed blood pressure 08/06/15 122/64. Therefore, will not change medicines and will continue meds the same.  On ACE inhibitor. Check BMET.  4. H/O  Bilateral lower extremity edema - hydrochlorothiazide (MICROZIDE) 12.5 MG capsule; Take 1 capsule (12.5 mg total) by mouth daily.  Dispense: 30 capsule; Refill: 5--- says he is no longer having lower extremity edema and is just using this when necessary.  5. S/P lumbar laminectomy At visit 08/06/15 he is requesting increased dose of hydrocodone from 5 mg to 10 mg. See history of present illness for detail. At the visit on 08/06/15 I discussed that this medication is addicting and discussed my concern of increasing the dose. Discussed just increasing it to 7.5 mg. However he is very reluctant to do this and states that he needs to go up to the 10 mg in order to get pain relief. Verifies that he takes either none some days or only one on the other days. Rx given for #30+0 refill. Will monitor use and make sure he is not using more than 1 per day. At [place] 02/11/2016---NO Rx for Pain given at this OV. He says he doesn't need Rx right now. Says only uses it when absolutley has to.  6. screening PSA: Normal 07/2011. Will not repeat given advanced age of 79.  6. screening colonoscopy: At recent office visits we have discussed that this was due.  At visit 06/2014 he says that he needs to get his back pain control prior to scheduling this. Given age 24, can probably stop these--will discuss with pt Discussed colonoscpoy with patient at visit 04/12/15. He does seem interested in wanting to have continued follow-up colonoscopies. However at today's visit he just says it to much is going on and for [place] to rediscuss it at his next visit once things settle down.  7.  Immunizations: Pneumonia vaccine: Received Pneumovax here 05/01/2008. Updated with Prevnar 13 12/2013. Tetanus: Given here 07/23/2011 Zostavax: He checked with his insurance-- cost would be $200 patient defers. Influenza Vaccine: Given here 08/06/2015  Regular office visit 3 months. Sooner if needed.    [address] [place], [place], [facility] 02/11/2016 8:37 AM

## 2016-02-12 ENCOUNTER — Encounter: Payer: Self-pay | Admitting: *Deleted

## 2016-02-13 ENCOUNTER — Other Ambulatory Visit: Payer: Self-pay | Admitting: Physician Assistant

## 2016-02-14 NOTE — Telephone Encounter (Signed)
Refill appropriate and filled per protocol. 

## 2016-02-27 ENCOUNTER — Other Ambulatory Visit: Payer: Self-pay | Admitting: Physician Assistant

## 2016-02-27 NOTE — Telephone Encounter (Signed)
Refill appropriate and filled per protocol. 

## 2016-03-04 ENCOUNTER — Other Ambulatory Visit: Payer: Self-pay

## 2016-03-04 ENCOUNTER — Ambulatory Visit: Payer: Self-pay

## 2016-03-04 NOTE — Patient Outreach (Signed)
[place] [facility]) Care Management  03/04/2016  TORIN DIEBOLD Jun 24, 1935 [id number]   Telephone call to patient for monthly call. No answer.  Unable to leave a message.   Plan: RN Health Coach will attempt patient within 1-2 weeks.    Jone Baseman, RN, MSN [place] [phone]

## 2016-03-10 ENCOUNTER — Other Ambulatory Visit: Payer: Self-pay | Admitting: Physician Assistant

## 2016-03-14 ENCOUNTER — Other Ambulatory Visit: Payer: Self-pay

## 2016-03-14 NOTE — Patient Outreach (Signed)
[place] [facility]) Care Management  [place]  03/14/2016   Christian [name] 01/16/1935 [id number]  Subjective: Telephone call to patient for monthly call.  Patient reports he is doing good. He reports that A1c was lower on last doctor's appointment. Reviewed with patient the importance of continuing to limit carbohydrates and sweets in order to keep blood sugar lower. He verbalized understanding.     Objective:   Encounter Medications:  Outpatient Encounter Prescriptions as of 03/14/2016  Medication Sig  . acetaminophen (TYLENOL) 500 MG tablet Take 1,000 mg by mouth every 8 (eight) hours as needed for mild pain or moderate pain (back pain).  Marland Kitchen aspirin EC 81 MG tablet Take 81 mg by mouth daily.    Marland Kitchen aspirin-sod bicarb-citric acid (ALKA-SELTZER) 325 MG TBEF tablet Take 325 mg by mouth daily as needed (morning congestion).  . cholecalciferol (VITAMIN D) 1000 UNITS tablet Take 1,000 Units by mouth daily.    . Cyanocobalamin (B-12 PO) Take 1 tablet by mouth daily.  . fish oil-omega-3 fatty acids 1000 MG capsule Take 3 g by mouth 2 (two) times daily.    Marland Kitchen gabapentin (NEURONTIN) 600 MG tablet TAKE ONE TABLET BY MOUTH THREE TIMES DAILY  . glipiZIDE (GLUCOTROL) 10 MG tablet TAKE ONE TABLET BY MOUTH TWICE DAILY BEFORE A MEAL  . Glucose Blood (BLOOD GLUCOSE TEST STRIPS) STRP Dispense based on patient and insurance preference. Use to monitor FSBS 3 x daily. Dx: E11.65  . JANUVIA 100 MG tablet TAKE ONE TABLET BY MOUTH ONCE DAILY  . lisinopril (PRINIVIL,ZESTRIL) 10 MG tablet TAKE ONE TABLET BY MOUTH ONCE DAILY  . metFORMIN (GLUCOPHAGE) 1000 MG tablet TAKE ONE TABLET BY MOUTH TWICE DAILY WITH MEALS  . Multiple Vitamin (MULITIVITAMIN WITH MINERALS) TABS Take 1 tablet by mouth daily.    . niacin (NIASPAN) 500 MG CR tablet TAKE ONE TABLET BY MOUTH AT BEDTIME  . potassium gluconate 595 MG TABS tablet Take 595 mg by mouth daily.  . simvastatin (ZOCOR) 40 MG tablet TAKE ONE TABLET BY  MOUTH ONCE DAILY WITH BREAKFAST  . HYDROcodone-acetaminophen (NORCO) 10-325 MG tablet Take 1 tablet by mouth every 8 (eight) hours as needed.   No facility-administered encounter medications on file as of 03/14/2016.    Functional Status:  In your present state of health, do you have any difficulty performing the following activities: 06/07/2015  Hearing? N  Vision? N  Difficulty concentrating or making decisions? N  Walking or climbing stairs? N  Dressing or bathing? N  Doing errands, shopping? N  Preparing Food and eating ? N  Using the Toilet? N  In the past six months, have you accidently leaked urine? N  Do you have problems with loss of bowel control? N  Managing your Medications? N  Managing your Finances? N  Housekeeping or managing your Housekeeping? N    Fall/Depression Screening: PHQ 2/9 Scores 03/14/2016 02/04/2016 01/07/2016 12/10/2015 11/12/2015 10/15/2015 09/18/2015  PHQ - 2 Score 0 0 0 0 0 0 0  PHQ- 9 Score - - - - - - -    Assessment: Patient continues to benefit from health coach outreach for disease management and support.  Plan:  [facility] CM Care Plan Problem One        Most Recent Value   Care Plan Problem One  Knowledge deficit diabetes   Role Documenting the Problem One  Health Coach   Care Plan for Problem One  Active   THN Long Term Goal (31-90 days)  Patient will keep blood sugars less than 175 withih the next 90 days.     THN Long Term Goal Start Date  03/14/16 [goal continued]   Interventions for Problem One Long Term Goal  RN Health Coach reviewed with patient goal of keeping blood sugar less than 175.  REviewed with patient limiting carbohydrates and sweets.     THN CM Short Term Goal #2 (0-30 days)  Patient will be able to report maintaining low carbohydrate diet within 30 days   THN CM Short Term Goal #2 Start Date  02/04/16   [facility] CM Short Term Goal #2 Met Date  03/14/16 Christian [name] met]   Interventions for Short Term Goal #2  Patient reports limiting carbohydrates.      RN Health Coach will contact patient within one month and patient agrees to next outreach.  Jone Baseman, RN, MSN [place] [phone]

## 2016-03-24 ENCOUNTER — Other Ambulatory Visit: Payer: Self-pay | Admitting: Physician Assistant

## 2016-03-25 NOTE — Telephone Encounter (Signed)
Medication refilled per protocol. 

## 2016-04-11 ENCOUNTER — Other Ambulatory Visit: Payer: Self-pay

## 2016-04-11 NOTE — Patient Outreach (Signed)
[place] [facility]) Care Management  04/11/2016  SHEEHAN BOUTILIER 19-Jan-1935 [id number]   Telephone call to patient for monthly call  No answer. Unable to leave a message.   Plan: RN Health Coach will attempt patient again in the month of July.    Jone Baseman, RN, MSN [place] [phone]

## 2016-04-25 ENCOUNTER — Other Ambulatory Visit: Payer: Self-pay

## 2016-04-25 NOTE — Patient Outreach (Signed)
[place] [facility]) Care Management  04/25/2016  Christian [name] 12/18/1934 [id number]   2nd telephone call to patient for monthly call. No answer. Unable to leave a message.   Plan: RN Health Coach will attempt patient again in the month of July.    Jone Baseman, RN, MSN [place] [phone]

## 2016-05-05 ENCOUNTER — Other Ambulatory Visit: Payer: Self-pay

## 2016-05-05 NOTE — Patient Outreach (Signed)
[facility]) Care Management  05/05/2016  JOHNY MISSEL 1934/12/03 [id number]   3rd telephone assessment attempt.  No answer.  Unable to leave a message.   Plan: RN Health Coach will send letter to attempt outreach.    Jone Baseman, RN, MSN  [place] [phone]

## 2016-05-06 NOTE — Progress Notes (Signed)
This encounter was created in error - please disregard.

## 2016-05-20 ENCOUNTER — Other Ambulatory Visit: Payer: Self-pay

## 2016-05-20 NOTE — Patient Outreach (Signed)
[place] [facility]) Care Management  05/20/2016  WARNER BARLING 03-30-1935 [id number]   Patient has not responded to calls or letter.  Will proceed with case closure.  Will send closure letter to patient and physician. Will notify care management assistant of case closure.  Jone Baseman, RN, MSN [place] [phone]

## 2016-05-21 ENCOUNTER — Ambulatory Visit (INDEPENDENT_AMBULATORY_CARE_PROVIDER_SITE_OTHER): Payer: PPO | Admitting: Physician Assistant

## 2016-05-21 ENCOUNTER — Encounter: Payer: Self-pay | Admitting: Physician Assistant

## 2016-05-21 VITALS — BP 118/60 | HR 63 | Temp 98.2°F | Resp 16 | Ht 68.0 in | Wt 201.0 lb

## 2016-05-21 DIAGNOSIS — E1142 Type 2 diabetes mellitus with diabetic polyneuropathy: Secondary | ICD-10-CM | POA: Diagnosis not present

## 2016-05-21 DIAGNOSIS — Z1211 Encounter for screening for malignant neoplasm of colon: Secondary | ICD-10-CM | POA: Diagnosis not present

## 2016-05-21 DIAGNOSIS — I10 Essential (primary) hypertension: Secondary | ICD-10-CM | POA: Diagnosis not present

## 2016-05-21 DIAGNOSIS — E1165 Type 2 diabetes mellitus with hyperglycemia: Secondary | ICD-10-CM

## 2016-05-21 DIAGNOSIS — E7800 Pure hypercholesterolemia, unspecified: Secondary | ICD-10-CM

## 2016-05-21 DIAGNOSIS — Z1212 Encounter for screening for malignant neoplasm of rectum: Secondary | ICD-10-CM | POA: Diagnosis not present

## 2016-05-21 LAB — COMPLETE METABOLIC PANEL WITH GFR
ALK PHOS: 43 U/L (ref 40–115)
ALT: 16 U/L (ref 9–46)
AST: 20 U/L (ref 10–35)
Albumin: 4.2 g/dL (ref 3.6–5.1)
BUN: 21 mg/dL (ref 7–25)
CHLORIDE: 107 mmol/L (ref 98–110)
CO2: 24 mmol/L (ref 20–31)
Calcium: 9.4 mg/dL (ref 8.6–10.3)
Creat: 1.19 mg/dL — ABNORMAL HIGH (ref 0.70–1.18)
GFR, EST AFRICAN AMERICAN: 67 mL/min (ref >=60)
GFR, EST NON AFRICAN AMERICAN: 58 mL/min — AB (ref >=60)
Glucose, Bld: 112 mg/dL — ABNORMAL HIGH (ref 70–99)
POTASSIUM: 4.6 mmol/L (ref 3.5–5.3)
Sodium: 141 mmol/L (ref 135–146)
Total Bilirubin: 0.4 mg/dL (ref 0.2–1.2)
Total Protein: 6.5 g/dL (ref 6.1–8.1)

## 2016-05-21 LAB — LIPID PANEL
CHOL/HDL RATIO: 3.5 ratio (ref <=5.0)
CHOLESTEROL: 136 mg/dL (ref 125–200)
HDL: 39 mg/dL — ABNORMAL LOW (ref >=40)
LDL Cholesterol: 73 mg/dL (ref <130)
TRIGLYCERIDES: 118 mg/dL (ref <150)
VLDL: 24 mg/dL (ref <30)

## 2016-05-21 NOTE — Progress Notes (Signed)
Patient ID: SEVILLE STEADMAN MRN: [id number], DOB: 15-Jun-1935, 81 y.o. Date of Encounter: @DATE @  Chief Complaint:  Chief Complaint  Patient presents with  . Follow-up    reports no problems    HPI: 81 y.o. year old white male  presents Routine follow-up of chronic medical problems..  Hypertension: Taking meds as directed. No adverse effects.  Hyperlipidemia: On Zocor 40mg  and on Niaspan. No myalgias or other adverse medication effects.  Diabetes: On metformin, Glucotrol, Januvia. 03/2014 he was having lower extremity edema so Actos was stopped and was changed to [place]. Also --have added Invokana, but unable to afford---further info below--  At OV 04/12/2015--is that he tries to check his blood sugar every morning but sometimes it's every 2 or 3 days. Even fasting morning he is getting high readings recently in the 200s sometimes even up to 300. Says that he has been "on the go ". Says that he is "not eating like are to ". Says that he has been working trying to get a vehicle to where it will pass inspection. Says that he has had been working on things around the house. Says that he "has been eating biscuits, potpie, sausage/ egg/ cheese croissant biscuits, french fries--all these things I know I shouldn't be eating". Asked exactly why this has changed but other than just being busy with these projects around the house he cannot give me any other answer. He has lived alone for 20 years. Says that he had been cooking healthier foods but says that he "hates cooking".  At 04/12/2015 OV, labs revealed HgbA1C 8.9.  Also Creatinine up to 1.36.  At that time increased the Glucotrol XL to 10 mg daily. Told him to document blood sugar readings and bring that in to visit in 3 weeks.  He comes in for f/u OV 05/03/2015. He brings blood sugar readings. Has the following for fasting morning readings: 229, 245, 166, 222, 240, 183, 197, 210, 164, 289, 212, 232, 999, 265, 208, 194, 171, 219,  212 "PM Readings": 117, 190, 172, 123, 218, and 96, 165, 232, 42, 249, 202, 215, 250, 220, 188, 247, 145.  At visit 05/03/2015 discussed with him the fact that we are running out of medication options and the fact that his sugar is not controlled.  Also discussed that the higher his sugar is the worse his kidney number will be.  Discussed that if his kidney numbers continue to rise will have to stop metformin and will have to decrease Januvia which is going to make his blood sugar even worse. Discussed his diet and the diet that he reported 04/12/2015. Also, I discussed the fact that he had lived alone for 20 years and asked if he had always been eating this way or if his diet recently worsened. He says that for the most part he has always eaten this way and really his diet has not changed much. Discussed then, if this is the case, that it's just that his pancreas is not working as well as it had in the past and that pancreatic function is gradually worsening. Discussed his breakfast biscuits and the fact that the sausage egg cheese has minimal carbohydrate but if he could just cut out the bread part-- or at least decrease the amount of bread that he is eating with that. Discussed french fries and the fact that this is potatoes which his carbohydrates and this needs to be cut out. Discussed potpie having crust which is basically bread/carbohydrate and  this needs to be cut out. He says that he does like beans and chicken. Discussed that he can eat as much of this as he wants. Gave him another carbohydrate handout to remind him the foods that he needs to be avoiding/limiting as much as possible.  He states that the Januvia cost him $45. Today I gave him 28 sample tablets to help save him some money.  At visit 05/03/15 I added Invokana. We had some samples of the 100 mg dose so I given him #10 of these to start taking one each morning. I have sent in a prescription for the 300 mg strength and he will  start taking that prescription strength after he finishes up the samples. (Will f/u with him at next OV to find out how much this COSTS him and whether he can afford this--cannot use savings card--Medicare) He is to work on his diet as well as these medication changes. Explained to him that if we cannot get his sugar controlled with this, that he will need to take Lantus--- he says that he did use the Lantus pens in the past and is aware of them. He will have follow-up visit here in 3 months to reassess.  At [place] 08/06/2015: He reports that he was not able to afford the [place].IS NOT TAKING INVOKANA. States that the Januvia is extremely expensive and the Invokana was even more expensive and he absolutely cannot afford to pay for both of these. He does bring in blood sugar log forms today. He has been checking fasting readings every morning. Also has been checking either before dinner or 2 hours after dinner. For the month of September fasting readings have been: 156, 165, 129, 174, 112, 125, 167, 169, 184, 170, 183, 171. These continue that all readings are similar. He does have a few readings lower such as 105, 94. However most are 145/167 or 187 range. The month of October fasting morning readings are: 142, 173, 225, 189, 169, 120, 176. Most of these continue similar to these and most are in the 160 range. Readings before dinner: 124, 169, 145. Most of these readings are in that kind of range. A few that are lower closer to 109. A couple readings even down to 85. 2 hours after dinner: 149, 169, 181. Most are in this range. Some are up at around 202 12. Some down to 143 and 127.  ADDENDUM added 08/23/2015:  Received notice from pharmaceutical company---for Invokana---Patient Assistance---They are NOT able to provide him with Invokana---Also, there are notes from [facility] in [place].  At visit with me  12/20/14 was here for a postoperative visit after having lumbar laminectomy 11/22/14. He has done well with  that surgery with no complications. At that [place], was  requesting a refill on his hydrocodone. Has the medicine bottle with him which was given when he left the rehabilitation facility. The date was 12/13/14 on his prescription bottle for #30. He says that he only uses these occasionally when he had absolutely has to. However says that he does occasionally have significant pain and wants to have some pain medicine on hand to use if needed. At [place] 08/06/2015--he says that he needs a refill on his hydrocodone. However he says that he needs the dose increased from 5 mg to 10 mg. Says that in general/on average he takes one per day. Some days doesn't have to take any. However when he does take it he feels no effect when he takes just 1. Has  to take 2 to get pain relief. Requesting refill for the 10 mg dose.  06/14/2015---RN Health Coach--sent me a message that they are now following patient. Since then he has multiple notes in Epic from [facility].  11/07/2015: He brings in blood sugar log.  Is that he needs to check his sugar 3 times a day but is running out of strips and wants [place] to order it so that he can check it 3 times a day. Has the log for November December and January. [place] today has a reading for fasting morning and 2 hours after dinner. Occasionally has sugars documented at other times of day as well. For the month of January fasting readings are: 299, 207, 180, 191, 91, 222, 150, 257, 154, 163, 172, 148, 192, 147, 128, 168, 87, 170, 87, 179, 99, 154 2 hour after dinner:  168, 152, 189, 150, 135, 178, 177, 124, 174, 88, 161, 200, 184, 102, 65, 168, 166, 92, 171  02/11/2016: BS Log Brought in: For month of April--he has been checking ~ 3 times a day on average.  Fastings: 166, 138, 185, 151, 174, 161. Lowest: 126, 128, 128.  Before Lunch: 90, 103, 140, 161, 169, 133 2 hours after lunch: 146, 185, 170,  Before Dinner: 100, 154, 120, 130, 73, 136 2 hour after dinner: 150, 167, 164, 144, 221, 157 No  complint/concern at this OV   05/21/2016:  He brought in blood sugar log for the past 4 months. He is checking blood sugar either 2 or 3 times every single day at different times of the day and as documented being on blood sugar log form. The following readings are some of the readings that he has from July and August but not all of them as he has documented lots of readings. Fasting readings are generally running:  126, 159, 203, 162, 213, 142, 169, 181, 152, 188. 139, 189, 160, 143, 172, 155, 220, 171, 138, 2 hour after breakfast: 176, 139, 162, 251, 131, 208, 216, 98, 197, 167 Before lunch: 138, 163, 121, 129, 196, 122, 93, 128, 156, 137, 159 2 hours after lunch:  1:30, 160, 200, 185, 186, 153 Before dinner: 91, 138, 61, 168, with 67, 106, 105, 165, 106, 129 2 hour after dinner: 256, 262, 179, 198, 126, 194, 200, 118, 168, He has none documented for the bedtime time slot.  Hypertension: Taking meds as directed. No adverse effects.  Hyperlipidemia: On Zocor 40mg  and on Niaspan. No myalgias or other adverse medication effects.  Diabetes: On metformin, Glucotrol, Januvia. 03/2014 he was having lower extremity edema so Actos was stopped and was changed to [place]. Also --have added Invokana, but unable to afford---further info below--   Past Medical History:  Diagnosis Date  . Arthritis   . Diabetes mellitus    Type 2  . Diabetic neuropathy ([place])   . History of kidney stones   . HTN (hypertension) 03/03/2013  . Hypercholesterolemia   . 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 Skin cancer of arm 04/20/2013   left upper arm  . Spinal stenosis 05/17/2014   L3-L4   . Urgency of urination      Home Meds:  Outpatient Medications Prior to Visit  Medication Sig Dispense Refill  . acetaminophen (TYLENOL) 500 MG tablet Take 1,000 mg by mouth every 8 (eight) hours as needed for mild pain or moderate pain (back pain).    Marland Kitchen aspirin EC 81 MG tablet Take 81 mg by mouth daily.      Marland Kitchen  cholecalciferol (VITAMIN D) 1000 UNITS tablet Take 1,000 Units by mouth daily.      . Cyanocobalamin (B-12 PO) Take 1 tablet by mouth daily.    . fish oil-omega-3 fatty acids 1000 MG capsule Take 3 g by mouth 2 (two) times daily.      Marland Kitchen gabapentin (NEURONTIN) 600 MG tablet TAKE ONE TABLET BY MOUTH THREE TIMES DAILY 90 tablet 3  . glipiZIDE (GLUCOTROL) 10 MG tablet TAKE ONE TABLET BY MOUTH TWICE DAILY BEFORE A MEAL 180 tablet 3  . Glucose Blood (BLOOD GLUCOSE TEST STRIPS) STRP Dispense based on patient and insurance preference. Use to monitor FSBS 3 x daily. Dx: E11.65 100 each 11  . HYDROcodone-acetaminophen (NORCO) 10-325 MG tablet Take 1 tablet by mouth every 8 (eight) hours as needed. 30 tablet 0  . JANUVIA 100 MG tablet TAKE ONE TABLET BY MOUTH ONCE DAILY 30 tablet 3  . lisinopril (PRINIVIL,ZESTRIL) 10 MG tablet TAKE ONE TABLET BY MOUTH ONCE DAILY 90 tablet 1  . metFORMIN (GLUCOPHAGE) 1000 MG tablet TAKE ONE TABLET BY MOUTH TWICE DAILY WITH  MEALS 180 tablet 3  . Multiple Vitamin (MULITIVITAMIN WITH MINERALS) TABS Take 1 tablet by mouth daily.      . niacin (NIASPAN) 500 MG CR tablet TAKE ONE TABLET BY MOUTH AT BEDTIME 90 tablet 0  . potassium gluconate 595 MG TABS tablet Take 595 mg by mouth daily.    . simvastatin (ZOCOR) 40 MG tablet TAKE ONE TABLET BY MOUTH ONCE DAILY WITH BREAKFAST 90 tablet 0  . aspirin-sod bicarb-citric acid (ALKA-SELTZER) 325 MG TBEF tablet Take 325 mg by mouth daily as needed (morning congestion).     No facility-administered medications prior to visit.     Allergies:  Allergies  Allergen Reactions  . Actos [Pioglitazone]     LE Edema--03/2014    Social History   Social History  . Marital status: Divorced    Spouse name: N/A  . Number of children: N/A  . Years of education: N/A   Occupational History  . Not on file.   Social History Main Topics  . Smoking status: Former Smoker    Quit date: 03/04/2003  . Smokeless tobacco: Never Used  . Alcohol use  No  . Drug use: No  . Sexual activity: Not on file   Other Topics Concern  . Not on file   Social History Narrative  . No narrative on file    No family history on file.   Review of Systems:  See HPI for pertinent ROS. All other ROS negative.    Physical Exam: Blood pressure 118/60, pulse 63, temperature 98.2 F (36.8 C), temperature source Oral, resp. rate 16, height 5\' 8"  (1.727 m), weight 201 lb (91.2 kg), SpO2 98 %., Body mass index is 30.56 kg/m. General: WM. Abdominal Obesity--Moderate. Appears in no acute distress. Neck: Supple. No thyromegaly. No lymphadenopathy.No carotid bruit.  Lungs: Clear bilaterally to auscultation without wheezes, rales, or rhonchi. Breathing is unlabored. Heart: RRR with S1 S2. No murmurs, rubs, or gallops. Abdomen: Soft, non-tender, non-distended with normoactive bowel sounds. No hepatomegaly. No rebound/guarding. No obvious abdominal masses. Musculoskeletal:  Strength and tone normal for age. Extremities/Skin: Warm and dry. 1+ - 2+ pitting edema at ankles bilaterally. No edema higher than this level.  Neuro: Alert and oriented X 3. Moves all extremities spontaneously. Gait is normal. CNII-XII grossly in tact. Psych:  Responds to questions appropriately with a normal affect. Diabetic foot exam: Inspection: He has a lot of scaly  skin. However no open wound or sores. Sensation is intact. Trace Left DP pulse. Trace Right DP pulse.   No palpable posterior tibial pulses bilaterally.      ASSESSMENT AND PLAN:  81 y.o. year old male with     ---------------------------NOTE---ADDENDUM ADDED 06/14/2015---- RECEIVED NOTE IN EPIC FROM RN HEALTH COACH---SEE HER NOTE DOCUMENTED IN EPIC--- ---SHE WILL CONT TO F/U WITH PT-------   1. Diabetes   04/12/2015--Increased Glucotrol XL to 10mg  QD. AT OV 05/03/2015---Added Invokana 08/06/2015: He reports that he was not able to afford the Invokana.IS NOT TAKING INVOKANA. States that the Januvia is extremely  expensive and the Invokana was even more expensive and he absolutely cannot afford to pay for both of these. ADDENDUM added 08/23/2015:  Received notice from pharmaceutical company---for Invokana---Patient Assistance---They are NOT able to provide him with Invokana---Also, there are notes from [facility] in [place]. H/O LE Edema with Actos  Watch Kidney Function regarding Metformin and Januvia  At future OVs, will give Januvia samples when they are available, to keep down cost for pt.   - Hemoglobin A1C----this was run while the patient was here in the office on 08/06/15. Surprisingly, his A1c was excellent!!!  He states that he has stopped the biscuits and has stopped bread and has made some diet changes. Told him to continue current treatment.   Microalbumin was performed 03/13/2014. Repeat 04/12/2015, 05/21/2016 Diabetic foot exam documented    He does have annual eye exam.  On ASA 81mg  QD On statin. On ACE inhibitor.  Has had Pneumovax 23 in past. Had Prevnar 13-- 12/2013.  2. Hypercholesterolemia On Zocor and Niaspan. Lipid panel excellent at last check.    3. Essential hypertension Blood Pressure controlled-- noted that  04/12/2015 blood pressure a little low but also noted that at my last visit 01/10/15 his blood pressure was a little high-- therefore, will continue current medicines the same and continue to monitor. At Visit 11/07/15 blood pressure 142/70. However reviewed blood pressure 08/06/15 122/64. Therefore, will not change medicines and will continue meds the same.  On ACE inhibitor. Check BMET.  4. H/O  Bilateral lower extremity edema - hydrochlorothiazide (MICROZIDE) 12.5 MG capsule; Take 1 capsule (12.5 mg total) by mouth daily.  Dispense: 30 capsule; Refill: 5--- says he is no longer having lower extremity edema and is just using this when necessary.  5. S/P lumbar laminectomy At visit 08/06/15 he is requesting increased dose of hydrocodone from 5 mg to 10 mg. See history of  present illness for detail. At the visit on 08/06/15 I discussed that this medication is addicting and discussed my concern of increasing the dose. Discussed just increasing it to 7.5 mg. However he is very reluctant to do this and states that he needs to go up to the 10 mg in order to get pain relief. Verifies that he takes either none some days or only one on the other days. Rx given for #30+0 refill. Will monitor use and make sure he is not using more than 1 per day. At [place] 02/11/2016---NO Rx for Pain given at this OV. He says he doesn't need Rx right now. Says only uses it when absolutley has to.  6. screening PSA: Normal 07/2011. Will not repeat given advanced age of 89.  53. screening colonoscopy: At recent office visits we have discussed that this was due.  At visit 06/2014 he says that he needs to get his back pain control prior to scheduling this. Given age 53, can probably  stop these--will discuss with pt Discussed colonoscpoy with patient at visit 04/12/15. He does seem interested in wanting to have continued follow-up colonoscopies. However at today's visit he just says it to much is going on and for [place] to rediscuss it at his next visit once things settle down. Discussed visit 05/21/16 and he is agreeable for me to place order for referral to GI for follow-up colonoscopy.---Ordered referrral to GI -- 05/21/2016  7. Immunizations: Pneumonia vaccine: Received Pneumovax here 05/01/2008. Updated with Prevnar 13 12/2013. Tetanus: Given here 07/23/2011 Zostavax: He checked with his insurance-- cost would be $200 patient defers. Influenza Vaccine: Given here 08/06/2015  Regular office visit 3 months. Sooner if needed.    Signed, [address] [place], [place], BSFM 05/21/2016 8:25 AM

## 2016-05-22 LAB — HEMOGLOBIN A1C
Hgb A1c MFr Bld: 6 % — ABNORMAL HIGH (ref <5.7)
MEAN PLASMA GLUCOSE: 126 mg/dL

## 2016-05-29 DIAGNOSIS — D485 Neoplasm of uncertain behavior of skin: Secondary | ICD-10-CM | POA: Diagnosis not present

## 2016-05-29 DIAGNOSIS — L821 Other seborrheic keratosis: Secondary | ICD-10-CM | POA: Diagnosis not present

## 2016-05-29 DIAGNOSIS — L98429 Non-pressure chronic ulcer of back with unspecified severity: Secondary | ICD-10-CM | POA: Diagnosis not present

## 2016-05-29 DIAGNOSIS — L570 Actinic keratosis: Secondary | ICD-10-CM | POA: Diagnosis not present

## 2016-05-29 DIAGNOSIS — L565 Disseminated superficial actinic porokeratosis (DSAP): Secondary | ICD-10-CM | POA: Diagnosis not present

## 2016-05-29 DIAGNOSIS — B078 Other viral warts: Secondary | ICD-10-CM | POA: Diagnosis not present

## 2016-05-29 DIAGNOSIS — D2271 Melanocytic nevi of right lower limb, including hip: Secondary | ICD-10-CM | POA: Diagnosis not present

## 2016-05-29 DIAGNOSIS — D225 Melanocytic nevi of trunk: Secondary | ICD-10-CM | POA: Diagnosis not present

## 2016-05-29 DIAGNOSIS — D2272 Melanocytic nevi of left lower limb, including hip: Secondary | ICD-10-CM | POA: Diagnosis not present

## 2016-06-16 ENCOUNTER — Other Ambulatory Visit: Payer: Self-pay | Admitting: Physician Assistant

## 2016-06-17 NOTE — Telephone Encounter (Signed)
Medication refilled per protocol. 

## 2016-06-20 ENCOUNTER — Other Ambulatory Visit: Payer: Self-pay | Admitting: Physician Assistant

## 2016-06-27 ENCOUNTER — Telehealth: Payer: Self-pay | Admitting: Physician Assistant

## 2016-06-27 NOTE — Telephone Encounter (Signed)
Received fax from [facility] back Auth# [id number] Treating Provider Dr. Dellia Cloud # of Visits 6 Start Date 08/26/16 End Date 02/22/2017 CPT 99499 DX z12.11 and Z12.10

## 2016-07-08 ENCOUNTER — Other Ambulatory Visit: Payer: Self-pay | Admitting: Physician Assistant

## 2016-07-08 NOTE — Telephone Encounter (Signed)
Patient needs rx for his hydrocodone  [phone]

## 2016-07-08 NOTE — Telephone Encounter (Signed)
Approved. # 30 + 0. 

## 2016-07-08 NOTE — Telephone Encounter (Signed)
Last OV 05-21-2016 Last refill  01-04-2016 Ok to refill?

## 2016-07-09 MED ORDER — HYDROCODONE-ACETAMINOPHEN 10-325 MG PO TABS: 1.0000 | ORAL_TABLET | Freq: Three times a day (TID) | ORAL | 0 refills | Status: DC | PRN

## 2016-07-09 NOTE — Telephone Encounter (Signed)
RX filled can be picked up after 2pm on 07-09-16

## 2016-07-28 ENCOUNTER — Other Ambulatory Visit: Payer: Self-pay | Admitting: Physician Assistant

## 2016-07-29 NOTE — Telephone Encounter (Signed)
Rx refilled per protocol 

## 2016-08-05 DIAGNOSIS — E119 Type 2 diabetes mellitus without complications: Secondary | ICD-10-CM | POA: Diagnosis not present

## 2016-08-05 LAB — HM DIABETES EYE EXAM

## 2016-08-13 ENCOUNTER — Other Ambulatory Visit: Payer: Self-pay | Admitting: Physician Assistant

## 2016-08-14 NOTE — Telephone Encounter (Signed)
Medication refilled per protocol. 

## 2016-08-15 ENCOUNTER — Other Ambulatory Visit: Payer: Self-pay

## 2016-08-15 MED ORDER — LISINOPRIL 10 MG PO TABS: 10.0000 mg | ORAL_TABLET | Freq: Every day | ORAL | 0 refills | Status: DC

## 2016-08-15 NOTE — Telephone Encounter (Signed)
Rx refilled per protocol 

## 2016-08-20 ENCOUNTER — Encounter: Payer: Self-pay | Admitting: Physician Assistant

## 2016-08-20 ENCOUNTER — Encounter (INDEPENDENT_AMBULATORY_CARE_PROVIDER_SITE_OTHER): Payer: PPO | Admitting: Physician Assistant

## 2016-08-21 ENCOUNTER — Ambulatory Visit: Payer: PPO | Admitting: Physician Assistant

## 2016-08-21 ENCOUNTER — Ambulatory Visit (INDEPENDENT_AMBULATORY_CARE_PROVIDER_SITE_OTHER): Payer: PPO | Admitting: Physician Assistant

## 2016-08-21 ENCOUNTER — Encounter: Payer: Self-pay | Admitting: Physician Assistant

## 2016-08-21 VITALS — BP 118/60 | HR 76 | Temp 97.6°F | Resp 18 | Wt 200.0 lb

## 2016-08-21 DIAGNOSIS — E119 Type 2 diabetes mellitus without complications: Secondary | ICD-10-CM | POA: Diagnosis not present

## 2016-08-21 DIAGNOSIS — R600 Localized edema: Secondary | ICD-10-CM | POA: Diagnosis not present

## 2016-08-21 DIAGNOSIS — E0841 Diabetes mellitus due to underlying condition with diabetic mononeuropathy: Secondary | ICD-10-CM | POA: Diagnosis not present

## 2016-08-21 DIAGNOSIS — I10 Essential (primary) hypertension: Secondary | ICD-10-CM | POA: Diagnosis not present

## 2016-08-21 DIAGNOSIS — E7800 Pure hypercholesterolemia, unspecified: Secondary | ICD-10-CM | POA: Diagnosis not present

## 2016-08-21 LAB — HEMOGLOBIN A1C, FINGERSTICK: HEMOGLOBIN A1C, FINGERSTICK: 6.5 % — AB (ref <5.7)

## 2016-08-21 NOTE — Progress Notes (Signed)
Patient ID: ARIO DIRKS MRN: [id number], DOB: [birth year], 81 y.o. Date of Encounter: @DATE @  Chief Complaint:  Chief Complaint  Patient presents with  . Diabetes    HPI: 81 y.o. year old male  presents for routine 3 months f/u OV to f/u DM, HHTN, and other chronic medical problems.   Hypertension: Taking meds as directed. No adverse effects.  Hyperlipidemia: On Zocor 40mg  and on Niaspan. No myalgias or other adverse medication effects.  Diabetes: On metformin, Glucotrol, Januvia. 03/2014 he was having lower extremity edema so Actos was stopped and was changed to [place]. Also --have added Invokana, but unable to afford- Keep lowering blood sugar log's from August September and October with him today. He has been checking blood sugar 3 times a day almost every day. Fasting morning readings do fluctuate quite a bit but most readings are consistent with the following readings: 126, 167, 124, 165, 188, 170, 144, 1:30. He does have a few readings in the 70s. He does have a few readings higher such as 200, 203, 211. He has also sometimes checks 2 hours after breakfast. These readings are around 160, 164, 240, 146, 167 Before lunch: 118, 124, 119, 106, 142, 185, 165 2 hours after lunch: 194, 164, 178, 165 Before dinner: 139, 140, 80, 144, 172, 159, 110 2 hours after dinner 182, 153, when 99, 156  In the past we had difficulty getting his diabetes controlled and he was very noncompliant with diet. All of this information is detailed and documented in my note 05/21/16. See that note for details of all of the past information.  He has no specific concerns to discuss today. He says that he is scheduled for colonoscopy with Dr. Orvan July next week.  Past Medical History:  Diagnosis Date  . Arthritis   . Diabetes mellitus    Type 2  . Diabetic neuropathy ([place])   . History of kidney stones   . HTN (hypertension) 03/03/2013  . Hypercholesterolemia   . Neurogenic claudication  05/17/2014  . Skin cancer of arm 04/20/2013   left upper arm  . Spinal stenosis 05/17/2014   L3-L4   . Urgency of urination      Home Meds: Outpatient Medications Prior to Visit  Medication Sig Dispense Refill  . acetaminophen (TYLENOL) 500 MG tablet Take 1,000 mg by mouth every 8 (eight) hours as needed for mild pain or moderate pain (back pain).    Marland Kitchen aspirin EC 81 MG tablet Take 81 mg by mouth daily.      . cholecalciferol (VITAMIN D) 1000 UNITS tablet Take 1,000 Units by mouth daily.      . Cyanocobalamin (B-12 PO) Take 1 tablet by mouth daily.    . fish oil-omega-3 fatty acids 1000 MG capsule Take 3 g by mouth 2 (two) times daily.      Marland Kitchen gabapentin (NEURONTIN) 600 MG tablet TAKE ONE TABLET BY MOUTH THREE TIMES DAILY 90 tablet 3  . glipiZIDE (GLUCOTROL) 10 MG tablet TAKE ONE TABLET BY MOUTH TWICE DAILY BEFORE A MEAL 180 tablet 3  . Glucose Blood (BLOOD GLUCOSE TEST STRIPS) STRP Dispense based on patient and insurance preference. Use to monitor FSBS 3 x daily. Dx: E11.65 100 each 11  . HYDROcodone-acetaminophen (NORCO) 10-325 MG tablet Take 1 tablet by mouth every 8 (eight) hours as needed. 30 tablet 0  . JANUVIA 100 MG tablet TAKE ONE TABLET BY MOUTH ONCE DAILY 30 tablet 0  . lisinopril (PRINIVIL,ZESTRIL) 10 MG tablet Take 1  tablet (10 mg total) by mouth daily. 90 tablet 0  . metFORMIN (GLUCOPHAGE) 1000 MG tablet TAKE ONE TABLET BY MOUTH TWICE DAILY WITH  MEALS 180 tablet 3  . Multiple Vitamin (MULITIVITAMIN WITH MINERALS) TABS Take 1 tablet by mouth daily.      . niacin (NIASPAN) 500 MG CR tablet TAKE ONE TABLET BY MOUTH AT BEDTIME 90 tablet 0  . potassium gluconate 595 MG TABS tablet Take 595 mg by mouth daily.    . simvastatin (ZOCOR) 40 MG tablet TAKE ONE TABLET BY MOUTH ONCE DAILY WITH BREAKFAST 90 tablet 0   No facility-administered medications prior to visit.     Allergies:  Allergies  Allergen Reactions  . Actos [Pioglitazone]     LE Edema--03/2014    Social History    Social History  . Marital status: Divorced    Spouse name: N/A  . Number of children: N/A  . Years of education: N/A   Occupational History  . Not on file.   Social History Main Topics  . Smoking status: Former Smoker    Quit date: 03/04/2003  . Smokeless tobacco: Never Used  . Alcohol use No  . Drug use: No  . Sexual activity: Not on file   Other Topics Concern  . Not on file   Social History Narrative  . No narrative on file    No family history on file.   Review of Systems:  See HPI for pertinent ROS. All other ROS negative.    Physical Exam: Blood pressure 118/60, pulse 76, temperature 97.6 F (36.4 C), temperature source Oral, resp. rate 18, weight 200 lb (90.7 kg), SpO2 96 %., Body mass index is 30.41 kg/m. General: WNWD WM. Appears in no acute distress. Neck: Supple. No thyromegaly. No lymphadenopathy. No carotid bruit. Lungs: Clear bilaterally to auscultation without wheezes, rales, or rhonchi. Breathing is unlabored. Heart: RRR with S1 S2. No murmurs, rubs, or gallops. Abdomen: Soft, non-tender, non-distended with normoactive bowel sounds. No hepatomegaly. No rebound/guarding. No obvious abdominal masses. Musculoskeletal:  Strength and tone normal for age. Extremities/Skin: Warm and dry. Trace lower extremity edema. Neuro: Alert and oriented X 3. Moves all extremities spontaneously. Gait is normal. CNII-XII grossly in tact. Psych:  Responds to questions appropriately with a normal affect. Diabetic foot exam: Inspection: On the dorsal aspect of his feet he has a lot of scaly skin. No open wounds or sores. Sensation is intact. Plantar surface of his feet look good. No calluses or wounds. Trace left DP pulse. Trace right DP pulse. No palpable posterior tibial pulses bilaterally.      ASSESSMENT AND PLAN:  81 y.o. year old male with   1. Essential hypertension Blood pressure is at goal/controlled. He is on ACE inhibitor. Labs have been stable and have been  monitoring these routinely.  2. Type 2 diabetes mellitus without complication, without long-term current use of insulin ([place])  08/21/2016:- Hemoglobin A1C, fingerstick         - Microalbumin, urine  not able to afford the Invokana.IS NOT TAKING INVOKANA. In past we did patient assistance forms with the pharmaceutical company and they were not able to provide him with assistance. also there are notes in Epic from [facility] regarding this  Januvia is extremely expensive--- I give him samples when possible  H/O LE Edema with Actos  Watch Kidney Function regarding Metformin and Januvia  He does have annual eye exam.  On ASA 81mg  QD On statin. On ACE inhibitor.  Has had Pneumovax  23 in past. Had Prevnar 13-- 12/2013.   3. Diabetic mononeuropathy associated with diabetes mellitus due to underlying condition ([place]) Stable/controlled with current dose of Neurontin  4. Hypercholesterolemia He is on Statin and Niaspan. Last FLP at goal. LFTs normal. Cotn current meds.  5. Bilateral lower extremity edema He uses HCTZ 12.5mg  PRN swelling--no longer having to use daily.    6. screening PSA: Normal 07/2011. Will not repeat given advanced age of 34.  28. screening colonoscopy: At recent office visits we have discussed that this was due.  At visit 06/2014 he says that he needs to get his back pain control prior to scheduling this. Given age 51, can probably stop these--will discuss with pt Discussed colonoscpoy with patient at visit 04/12/15. He does seem interested in wanting to have continued follow-up colonoscopies. However at today's visit he just says it to much is going on and for [place] to rediscuss it at his next visit once things settle down. Discussed visit 05/21/16 and he is agreeable for me to place order for referral to GI for follow-up colonoscopy.---Ordered referrral to GI -- 05/21/2016 08/21/2016---Has colonoscopy scheduled this upcoming week.   7. Immunizations: Pneumonia vaccine:  Received Pneumovax here 05/01/2008. Updated with Prevnar 13 12/2013. Tetanus: Given here 07/23/2011 Zostavax: He checked with his insurance-- cost would be $200 patient defers. Influenza Vaccine: Given here 08/06/2015  Regular office visit 3 months. Sooner if needed.    [address] [place], [place], [facility] 08/21/2016 1:49 PM

## 2016-08-21 NOTE — Progress Notes (Signed)
This encounter was created in error - please disregard.

## 2016-08-22 ENCOUNTER — Encounter: Payer: Self-pay | Admitting: Family Medicine

## 2016-08-22 LAB — MICROALBUMIN, URINE: Microalb, Ur: 0.4 mg/dL

## 2016-08-29 ENCOUNTER — Other Ambulatory Visit: Payer: Self-pay | Admitting: Physician Assistant

## 2016-09-01 NOTE — Telephone Encounter (Signed)
Rx filled per protocol  

## 2016-09-05 DIAGNOSIS — Z1211 Encounter for screening for malignant neoplasm of colon: Secondary | ICD-10-CM | POA: Diagnosis not present

## 2016-09-23 ENCOUNTER — Other Ambulatory Visit: Payer: Self-pay | Admitting: Physician Assistant

## 2016-10-07 ENCOUNTER — Telehealth: Payer: Self-pay | Admitting: Physician Assistant

## 2016-10-07 NOTE — Telephone Encounter (Signed)
Patient is calling to get rx for his hydrocodone and also would like [place] called in only for 30 days because he cant affort 90 day supply [facility]  He also needs niacin

## 2016-10-08 MED ORDER — SITAGLIPTIN PHOSPHATE 100 MG PO TABS: 100.0000 mg | ORAL_TABLET | Freq: Every day | ORAL | 3 refills | Status: DC

## 2016-10-08 MED ORDER — NIACIN ER (ANTIHYPERLIPIDEMIC) 500 MG PO TBCR: 500.0000 mg | EXTENDED_RELEASE_TABLET | Freq: Every day | ORAL | 0 refills | Status: DC

## 2016-10-08 MED ORDER — HYDROCODONE-ACETAMINOPHEN 10-325 MG PO TABS: 1.0000 | ORAL_TABLET | Freq: Three times a day (TID) | ORAL | 0 refills | Status: DC | PRN

## 2016-10-08 NOTE — Telephone Encounter (Signed)
Ok to refill??      Hydrocodone  

## 2016-10-08 NOTE — Telephone Encounter (Signed)
Approved. # 30 + 0. 

## 2016-10-08 NOTE — Telephone Encounter (Signed)
Prescription printed and patient made aware to come to office to pick up on 10/09/2016.  Prescription sent to pharmacy.

## 2016-11-06 ENCOUNTER — Other Ambulatory Visit: Payer: Self-pay | Admitting: Physician Assistant

## 2016-11-26 ENCOUNTER — Encounter: Payer: Self-pay | Admitting: Physician Assistant

## 2016-11-26 ENCOUNTER — Ambulatory Visit (INDEPENDENT_AMBULATORY_CARE_PROVIDER_SITE_OTHER): Payer: PPO | Admitting: Physician Assistant

## 2016-11-26 VITALS — BP 122/62 | HR 80 | Temp 97.7°F | Resp 18 | Wt 207.0 lb

## 2016-11-26 DIAGNOSIS — E0842 Diabetes mellitus due to underlying condition with diabetic polyneuropathy: Secondary | ICD-10-CM

## 2016-11-26 DIAGNOSIS — E7800 Pure hypercholesterolemia, unspecified: Secondary | ICD-10-CM | POA: Diagnosis not present

## 2016-11-26 DIAGNOSIS — I10 Essential (primary) hypertension: Secondary | ICD-10-CM

## 2016-11-26 DIAGNOSIS — R600 Localized edema: Secondary | ICD-10-CM | POA: Diagnosis not present

## 2016-11-26 DIAGNOSIS — E1140 Type 2 diabetes mellitus with diabetic neuropathy, unspecified: Secondary | ICD-10-CM | POA: Diagnosis not present

## 2016-11-26 LAB — COMPLETE METABOLIC PANEL WITH GFR
ALT: 20 U/L (ref 9–46)
AST: 19 U/L (ref 10–35)
Albumin: 4.1 g/dL (ref 3.6–5.1)
Alkaline Phosphatase: 59 U/L (ref 40–115)
BILIRUBIN TOTAL: 0.4 mg/dL (ref 0.2–1.2)
BUN: 23 mg/dL (ref 7–25)
CHLORIDE: 105 mmol/L (ref 98–110)
CO2: 22 mmol/L (ref 20–31)
CREATININE: 1.31 mg/dL — AB (ref 0.70–1.18)
Calcium: 9.5 mg/dL (ref 8.6–10.3)
GFR, EST AFRICAN AMERICAN: 59 mL/min — AB (ref >=60)
GFR, Est Non African American: 51 mL/min — ABNORMAL LOW (ref >=60)
GLUCOSE: 157 mg/dL — AB (ref 70–99)
Potassium: 4.3 mmol/L (ref 3.5–5.3)
SODIUM: 140 mmol/L (ref 135–146)
TOTAL PROTEIN: 6.7 g/dL (ref 6.1–8.1)

## 2016-11-26 LAB — LIPID PANEL
Cholesterol: 134 mg/dL (ref <200)
HDL: 35 mg/dL — ABNORMAL LOW (ref >40)
LDL CALC: 64 mg/dL (ref <100)
TRIGLYCERIDES: 176 mg/dL — AB (ref <150)
Total CHOL/HDL Ratio: 3.8 Ratio (ref <5.0)
VLDL: 35 mg/dL — AB (ref <30)

## 2016-11-26 LAB — HEMOGLOBIN A1C
HEMOGLOBIN A1C: 6.9 % — AB (ref <5.7)
Mean Plasma Glucose: 151 mg/dL

## 2016-11-26 NOTE — Progress Notes (Signed)
Patient ID: Christian [name] MRN: [id number], DOB: [birth year], 82 y.o. Date of Encounter: @DATE @  Chief Complaint:  Chief Complaint  Patient presents with  . 3 mth check up    is fasting    HPI: 82 y.o. year old male  presents for routine 3 months f/u OV to f/u DM, HTN, and other chronic medical problems.   Hypertension: 11/26/2016: Taking meds as directed. No adverse effects.  Hyperlipidemia: 11/26/2016: On Zocor 40mg  and on Niaspan. No myalgias or other adverse medication effects.  Diabetes: On metformin, Glucotrol, Januvia. 03/2014 he was having lower extremity edema so Actos was stopped and was changed to [place]. Also --have added Invokana, but unable to afford- 08/2016: Brought blood sugar log's from August September and October with him today. He has been checking blood sugar 3 times a day almost every day. Fasting morning readings do fluctuate quite a bit but most readings are consistent with the following readings: 126, 167, 124, 165, 188, 170, 144, 1:30. He does have a few readings in the 70s. He does have a few readings higher such as 200, 203, 211. He has also sometimes checks 2 hours after breakfast. These readings are around 160, 164, 240, 146, 167 Before lunch: 118, 124, 119, 106, 142, 185, 165 2 hours after lunch: 194, 164, 178, 165 Before dinner: 139, 140, 80, 144, 172, 159, 110 2 hours after dinner 182, 153, when 99, 156  11/26/2016: He did not bring BS log with him today. Says he has been checkin BS "off and on"--says yesterday morning was 114---couple hours later it was 170s and he had eaten noting--and it had gone up.  This morning was 215.  Says he hasnt been that hungry. Not eating a lot.   In the past we had difficulty getting his diabetes controlled and he was very noncompliant with diet. All of this information is detailed and documented in my note 05/21/16. See that note for details of all of the past information.  11/26/2016: Says he has been feeling  good. No concerns to address today.    Past Medical History:  Diagnosis Date  . Arthritis   . Diabetes mellitus    Type 2  . Diabetic neuropathy ([place])   . History of kidney stones   . HTN (hypertension) 03/03/2013  . Hypercholesterolemia   . 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 Skin cancer of arm 04/20/2013   left upper arm  . Spinal stenosis 05/17/2014   L3-L4   . Urgency of urination      Home Meds: Outpatient Medications Prior to Visit  Medication Sig Dispense Refill  . acetaminophen (TYLENOL) 500 MG tablet Take 1,000 mg by mouth every 8 (eight) hours as needed for mild pain or moderate pain (back pain).    Marland Kitchen aspirin EC 81 MG tablet Take 81 mg by mouth daily.      . cholecalciferol (VITAMIN D) 1000 UNITS tablet Take 1,000 Units by mouth daily.      . Cyanocobalamin (B-12 PO) Take 1 tablet by mouth daily.    . fish oil-omega-3 fatty acids 1000 MG capsule Take 3 g by mouth 2 (two) times daily.      Marland Kitchen gabapentin (NEURONTIN) 600 MG tablet TAKE ONE TABLET BY MOUTH THREE TIMES DAILY 90 tablet 3  . glipiZIDE (GLUCOTROL) 10 MG tablet TAKE ONE TABLET BY MOUTH TWICE DAILY BEFORE A MEAL 180 tablet 3  . Glucose Blood (BLOOD GLUCOSE TEST STRIPS) STRP Dispense based on patient and insurance preference.  Use to monitor FSBS 3 x daily. Dx: E11.65 100 each 11  . HYDROcodone-acetaminophen (NORCO) 10-325 MG tablet Take 1 tablet by mouth every 8 (eight) hours as needed. 30 tablet 0  . lisinopril (PRINIVIL,ZESTRIL) 10 MG tablet Take 1 tablet (10 mg total) by mouth daily. 90 tablet 0  . metFORMIN (GLUCOPHAGE) 1000 MG tablet TAKE ONE TABLET BY MOUTH TWICE DAILY WITH  MEALS 180 tablet 3  . Multiple Vitamin (MULITIVITAMIN WITH MINERALS) TABS Take 1 tablet by mouth daily.      . niacin (NIASPAN) 500 MG CR tablet Take 1 tablet (500 mg total) by mouth at bedtime. 90 tablet 0  . potassium gluconate 595 MG TABS tablet Take 595 mg by mouth daily.    . simvastatin (ZOCOR) 40 MG tablet TAKE ONE TABLET BY  MOUTH ONCE DAILY WITH BREAKFAST 90 tablet 0  . sitaGLIPtin (JANUVIA) 100 MG tablet Take 1 tablet (100 mg total) by mouth daily. 30 tablet 3   No facility-administered medications prior to visit.     Allergies:  Allergies  Allergen Reactions  . Actos [Pioglitazone]     LE Edema--03/2014    Social History   Social History  . Marital status: Divorced    Spouse name: N/A  . Number of children: N/A  . Years of education: N/A   Occupational History  . Not on file.   Social History Main Topics  . Smoking status: Former Smoker    Quit date: 03/04/2003  . Smokeless tobacco: Never Used  . Alcohol use No  . Drug use: No  . Sexual activity: Not on file   Other Topics Concern  . Not on file   Social History Narrative  . No narrative on file    No family history on file.   Review of Systems:  See HPI for pertinent ROS. All other ROS negative.    Physical Exam: Blood pressure 122/62, pulse 80, temperature 97.7 F (36.5 C), temperature source Oral, resp. rate 18, weight 207 lb (93.9 kg)., There is no height or weight on file to calculate BMI. General: WNWD WM. Appears in no acute distress. Neck: Supple. No thyromegaly. No lymphadenopathy. No carotid bruit. Lungs: Clear bilaterally to auscultation without wheezes, rales, or rhonchi. Breathing is unlabored. Heart: RRR with S1 S2. No murmurs, rubs, or gallops. Abdomen: Soft, non-tender, non-distended with normoactive bowel sounds. No hepatomegaly. No rebound/guarding. No obvious abdominal masses. Musculoskeletal:  Strength and tone normal for age. Extremities/Skin: Warm and dry. Trace lower extremity edema. Neuro: Alert and oriented X 3. Moves all extremities spontaneously. Gait is normal. CNII-XII grossly in tact. Psych:  Responds to questions appropriately with a normal affect. Diabetic foot exam: Inspection: On the dorsal aspect of his feet he has a lot of scaly skin. No open wounds or sores. Sensation is intact. Plantar surface  of his feet look good. No calluses or wounds. Trace left DP pulse. Trace right DP pulse. No palpable posterior tibial pulses bilaterally.      ASSESSMENT AND PLAN:  82 y.o. year old male with   1. Essential hypertension 11/26/2016: Blood pressure is at goal/controlled. He is on ACE inhibitor. Labs have been stable and have been monitoring these routinely.  2. Type 2 diabetes mellitus without complication, without long-term current use of insulin ([place])  08/21/2016:- Hemoglobin A1C, fingerstick         - Microalbumin, urine 11/26/2016---Check A1C, CMET  not able to afford the Invokana.IS NOT TAKING INVOKANA. In past we did patient assistance forms  with the pharmaceutical company and they were not able to provide him with assistance. also there are notes in Epic from [facility] regarding this  Januvia is extremely expensive--- I give him samples when possible 11/26/2016--gave 6 weeks of samples of Januvia  H/O LE Edema with Actos  Watch Kidney Function regarding Metformin and Januvia  He does have annual eye exam.  On ASA 81mg  QD On statin. On ACE inhibitor.  Has had Pneumovax 23 in past. Had Prevnar 13-- 12/2013.   3. Diabetic mononeuropathy associated with diabetes mellitus due to underlying condition ([place]) 11/26/2016---Stable/controlled with current dose of Neurontin  4. Hypercholesterolemia 11/26/2016---He is on Statin and Niaspan. Last FLP at goal. LFTs normal. Cont current meds.  5. Bilateral lower extremity edema 11/26/2016---He uses HCTZ 12.5mg  PRN swelling--no longer having to use daily.    6. screening PSA: Normal 07/2011. 11/26/2016---Will not repeat given advanced age of 63  7. screening colonoscopy: At recent office visits we have discussed that this was due.  At visit 06/2014 he says that he needs to get his back pain control prior to scheduling this. Given age 52, can probably stop these--will discuss with pt Discussed colonoscpoy with patient at visit 04/12/15. He  does seem interested in wanting to have continued follow-up colonoscopies. However at today's visit he just says it to much is going on and for [place] to rediscuss it at his next visit once things settle down. Discussed visit 05/21/16 and he is agreeable for me to place order for referral to GI for follow-up colonoscopy.---Ordered referrral to GI -- 05/21/2016 08/21/2016---Has colonoscopy scheduled this upcoming week.  11/26/2016--He reports that Dr. Kalman Shan gave him hemeOccults cards to turn in --did not have to do colonoscopy  7. Immunizations: Pneumonia vaccine: Received Pneumovax here 05/01/2008. Updated with Prevnar 13 12/2013.Received 06/23/2016 Tetanus: Given here 07/23/2011 Zostavax: He checked with his insurance-- cost would be $200 patient defers. Influenza Vaccine: Given here 08/06/2015  Regular office visit 3 months. Sooner if needed.    Marin Ol[address] [place], [place], [facility] 11/26/2016 8:07 AM

## 2016-12-04 ENCOUNTER — Other Ambulatory Visit: Payer: Self-pay | Admitting: Physician Assistant

## 2016-12-04 DIAGNOSIS — L430 Hypertrophic lichen planus: Secondary | ICD-10-CM | POA: Diagnosis not present

## 2016-12-04 DIAGNOSIS — L565 Disseminated superficial actinic porokeratosis (DSAP): Secondary | ICD-10-CM | POA: Diagnosis not present

## 2016-12-04 DIAGNOSIS — L570 Actinic keratosis: Secondary | ICD-10-CM | POA: Diagnosis not present

## 2016-12-04 DIAGNOSIS — D485 Neoplasm of uncertain behavior of skin: Secondary | ICD-10-CM | POA: Diagnosis not present

## 2016-12-04 DIAGNOSIS — L821 Other seborrheic keratosis: Secondary | ICD-10-CM | POA: Diagnosis not present

## 2016-12-22 ENCOUNTER — Other Ambulatory Visit: Payer: Self-pay | Admitting: Physician Assistant

## 2016-12-22 MED ORDER — SIMVASTATIN 40 MG PO TABS: ORAL_TABLET | ORAL | 0 refills | Status: DC

## 2016-12-22 MED ORDER — HYDROCODONE-ACETAMINOPHEN 10-325 MG PO TABS: 1.0000 | ORAL_TABLET | Freq: Three times a day (TID) | ORAL | 0 refills | Status: DC | PRN

## 2016-12-22 NOTE — Telephone Encounter (Signed)
Rx filled patient is aware he can pick up on 3-20

## 2016-12-22 NOTE — Telephone Encounter (Signed)
Patient calling to get rx for his hydrocodone and for his simvastatin to be called [facility] to [facility]

## 2016-12-22 NOTE — Telephone Encounter (Signed)
Approved. # 30 + 0. 

## 2016-12-22 NOTE — Telephone Encounter (Signed)
Last OV 2/21 Last Refill 10-08-2016  Ok to refill Hydrocodone?

## 2017-01-08 ENCOUNTER — Other Ambulatory Visit: Payer: Self-pay | Admitting: Physician Assistant

## 2017-01-09 NOTE — Telephone Encounter (Signed)
Refill appropriate 

## 2017-02-23 ENCOUNTER — Encounter: Payer: Self-pay | Admitting: Physician Assistant

## 2017-02-23 ENCOUNTER — Ambulatory Visit (INDEPENDENT_AMBULATORY_CARE_PROVIDER_SITE_OTHER): Payer: PPO | Admitting: Physician Assistant

## 2017-02-23 VITALS — BP 124/72 | HR 71 | Temp 97.6°F | Resp 16 | Wt 203.2 lb

## 2017-02-23 DIAGNOSIS — Z9889 Other specified postprocedural states: Secondary | ICD-10-CM

## 2017-02-23 DIAGNOSIS — E7800 Pure hypercholesterolemia, unspecified: Secondary | ICD-10-CM

## 2017-02-23 DIAGNOSIS — E1140 Type 2 diabetes mellitus with diabetic neuropathy, unspecified: Secondary | ICD-10-CM

## 2017-02-23 DIAGNOSIS — I10 Essential (primary) hypertension: Secondary | ICD-10-CM | POA: Diagnosis not present

## 2017-02-23 DIAGNOSIS — E0842 Diabetes mellitus due to underlying condition with diabetic polyneuropathy: Secondary | ICD-10-CM | POA: Diagnosis not present

## 2017-02-23 LAB — HEMOGLOBIN A1C, FINGERSTICK: Hgb A1C (fingerstick): 6.8 % — ABNORMAL HIGH (ref <5.7)

## 2017-02-23 NOTE — Progress Notes (Signed)
Patient ID: Christian [name] MRN: [id number], DOB: 12-Jun-1935, 82 y.o. Date of Encounter: @DATE @  Chief Complaint:  Chief Complaint  Patient presents with  . Hypertension f/u    HPI: 82 y.o. year old male  presents for routine 3 months f/u OV to f/u DM, HTN, and other chronic medical problems.   Hypertension: 02/23/2017: Taking meds as directed. No adverse effects.  Hyperlipidemia: 02/23/2017: On Zocor 40mg  and on Niaspan. No myalgias or other adverse medication effects.  Diabetes: On metformin, Glucotrol, Januvia. 03/2014 he was having lower extremity edema so Actos was stopped and was changed to [place]. Also --have added Invokana, but unable to afford- 08/2016: Brought blood sugar log's from August September and October with him today. He has been checking blood sugar 3 times a day almost every day. Fasting morning readings do fluctuate quite a bit but most readings are consistent with the following readings: 126, 167, 124, 165, 188, 170, 144, 1:30. He does have a few readings in the 70s. He does have a few readings higher such as 200, 203, 211. He has also sometimes checks 2 hours after breakfast. These readings are around 160, 164, 240, 146, 167 Before lunch: 118, 124, 119, 106, 142, 185, 165 2 hours after lunch: 194, 164, 178, 165 Before dinner: 139, 140, 80, 144, 172, 159, 110 2 hours after dinner 182, 153, when 99, 156  02/23/2017: He did not bring BS log with him today.  Says he feels like he has slacked off some with his diet-- thinks he has been eating more bread--hoping A1C isn't up today.  In the past we had difficulty getting his diabetes controlled and he was very noncompliant with diet. All of this information is detailed and documented in my note 05/21/16. See that note for details of all of the past information.  02/23/2017: Says he has been feeling good. No concerns to address today.    Past Medical History:  Diagnosis Date  . Arthritis   . Diabetes  mellitus    Type 2  . Diabetic neuropathy ([place])   . History of kidney stones   . HTN (hypertension) 03/03/2013  . Hypercholesterolemia   . 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 Skin cancer of arm 04/20/2013   left upper arm  . Spinal stenosis 05/17/2014   L3-L4   . Urgency of urination      Home Meds: Outpatient Medications Prior to Visit  Medication Sig Dispense Refill  . acetaminophen (TYLENOL) 500 MG tablet Take 1,000 mg by mouth every 8 (eight) hours as needed for mild pain or moderate pain (back pain).    Marland Kitchen aspirin EC 81 MG tablet Take 81 mg by mouth daily.      . cholecalciferol (VITAMIN D) 1000 UNITS tablet Take 1,000 Units by mouth daily.      . Cyanocobalamin (B-12 PO) Take 1 tablet by mouth daily.    . fish oil-omega-3 fatty acids 1000 MG capsule Take 3 g by mouth 2 (two) times daily.      Marland Kitchen gabapentin (NEURONTIN) 600 MG tablet TAKE ONE TABLET BY MOUTH THREE TIMES DAILY 90 tablet 3  . glipiZIDE (GLUCOTROL) 10 MG tablet TAKE ONE TABLET BY MOUTH TWICE DAILY BEFORE A MEAL 180 tablet 3  . HYDROcodone-acetaminophen (NORCO) 10-325 MG tablet Take 1 tablet by mouth every 8 (eight) hours as needed. 30 tablet 0  . lisinopril (PRINIVIL,ZESTRIL) 10 MG tablet Take 1 tablet (10 mg total) by mouth daily. 90 tablet 0  . metFORMIN (  GLUCOPHAGE) 1000 MG tablet TAKE ONE TABLET BY MOUTH TWICE DAILY WITH  MEALS 180 tablet 3  . Multiple Vitamin (MULITIVITAMIN WITH MINERALS) TABS Take 1 tablet by mouth daily.      . niacin (NIASPAN) 500 MG CR tablet TAKE ONE TABLET BY MOUTH AT BEDTIME 90 tablet 0  . ONE TOUCH ULTRA TEST test strip USE TO CHECK FASTING BLOOD SUGARS THREE TIMES DAILY 100 each 11  . potassium gluconate 595 MG TABS tablet Take 595 mg by mouth daily.    . simvastatin (ZOCOR) 40 MG tablet TAKE ONE TABLET BY MOUTH ONCE DAILY WITH BREAKFAST 90 tablet 0  . sitaGLIPtin (JANUVIA) 100 MG tablet Take 1 tablet (100 mg total) by mouth daily. 30 tablet 3  . lisinopril (PRINIVIL,ZESTRIL) 10 MG  tablet TAKE ONE TABLET BY MOUTH ONCE DAILY 90 tablet 1   No facility-administered medications prior to visit.     Allergies:  Allergies  Allergen Reactions  . Actos [Pioglitazone]     LE Edema--03/2014    Social History   Social History  . Marital status: Divorced    Spouse name: N/A  . Number of children: N/A  . Years of education: N/A   Occupational History  . Not on file.   Social History Main Topics  . Smoking status: Former Smoker    Quit date: 03/04/2003  . Smokeless tobacco: Never Used  . Alcohol use No  . Drug use: No  . Sexual activity: Not on file   Other Topics Concern  . Not on file   Social History Narrative  . No narrative on file    No family history on file.   Review of Systems:  See HPI for pertinent ROS. All other ROS negative.    Physical Exam: Blood pressure 124/72, pulse 71, temperature 97.6 F (36.4 C), temperature source Oral, resp. rate 16, weight 203 lb 3.2 oz (92.2 kg), SpO2 98 %., Body mass index is 30.9 kg/m. General: WNWD WM. Appears in no acute distress. Neck: Supple. No thyromegaly. No lymphadenopathy. No carotid bruit. Lungs: Clear bilaterally to auscultation without wheezes, rales, or rhonchi. Breathing is unlabored. Heart: RRR with S1 S2. No murmurs, rubs, or gallops. Abdomen: Soft, non-tender, non-distended with normoactive bowel sounds. No hepatomegaly. No rebound/guarding. No obvious abdominal masses. Musculoskeletal:  Strength and tone normal for age. Extremities/Skin: Warm and dry. Trace lower extremity edema. Neuro: Alert and oriented X 3. Moves all extremities spontaneously. Gait is normal. CNII-XII grossly in tact. Psych:  Responds to questions appropriately with a normal affect. Diabetic foot exam: Inspection: On the dorsal aspect of his feet he has a lot of scaly skin. No open wounds or sores. Sensation is intact. Plantar surface of his feet look good. No calluses or wounds. Trace left DP pulse. Trace right DP pulse. No  palpable posterior tibial pulses bilaterally.      ASSESSMENT AND PLAN:  82 y.o. year old male with   1. Essential hypertension 02/23/2017: Blood pressure is at goal/controlled. He is on ACE inhibitor. Labs have been stable and have been monitoring these routinely.  2. Type 2 diabetes mellitus without complication, without long-term current use of insulin ([place])  08/21/2016- - Microalbumin, urine 02/23/2017---Check A1C  not able to afford the Invokana.IS NOT TAKING INVOKANA. In past we did patient assistance forms with the pharmaceutical company and they were not able to provide him with assistance. also there are notes in Epic from [facility] regarding this  Januvia is extremely expensive--- I give him samples  when possible 11/26/2016--gave 6 weeks of samples of Januvia 02/23/2017--gave 4 weeks of samples of Januvia  H/O LE Edema with Actos  Watch Kidney Function regarding Metformin and Januvia  He does have annual eye exam.  On ASA 81mg  QD On statin. On ACE inhibitor.  Has had Pneumovax 23 in past. Had Prevnar 13-- 12/2013.   3. Diabetic mononeuropathy associated with diabetes mellitus due to underlying condition ([place]) 02/23/2017---Stable/controlled with current dose of Neurontin  4. Hypercholesterolemia 02/23/2017---He is on Statin and Niaspan. Last FLP at goal. LFTs normal. Cont current meds.  5. Bilateral lower extremity edema 02/23/2017---He uses HCTZ 12.5mg  PRN swelling--no longer having to use daily.    6. screening PSA: Normal 07/2011. 02/23/2017---Will not repeat given advanced age   59. screening colonoscopy: At recent office visits we have discussed that this was due.  At visit 06/2014 he says that he needs to get his back pain control prior to scheduling this. Given age 61, can probably stop these--will discuss with pt Discussed colonoscpoy with patient at visit 04/12/15. He does seem interested in wanting to have continued follow-up colonoscopies. However at today's  visit he just says it to much is going on and for [place] to rediscuss it at his next visit once things settle down. Discussed visit 05/21/16 and he is agreeable for me to place order for referral to GI for follow-up colonoscopy.---Ordered referrral to GI -- 05/21/2016 08/21/2016---Has colonoscopy scheduled this upcoming week.  11/26/2016--He reports that Dr. Kalman Shan gave him hemeOccults cards to turn in --did not have to do colonoscopy  7. Immunizations: Pneumonia vaccine: Received Pneumovax here 05/01/2008. Updated with Prevnar 13 12/2013.Received 06/23/2016 Tetanus: Given here 07/23/2011 Zostavax: He checked with his insurance-- cost would be $200 patient defers. Influenza Vaccine: Given here 08/06/2015  Regular office visit 3 months. Sooner if needed.    Signed, [address] [place], [place], [facility] 02/23/2017 9:08 AM

## 2017-02-27 ENCOUNTER — Other Ambulatory Visit: Payer: Self-pay | Admitting: Physician Assistant

## 2017-03-24 ENCOUNTER — Other Ambulatory Visit: Payer: Self-pay | Admitting: Physician Assistant

## 2017-03-24 NOTE — Telephone Encounter (Signed)
Refill appropriate 

## 2017-03-31 ENCOUNTER — Other Ambulatory Visit: Payer: Self-pay

## 2017-04-21 ENCOUNTER — Other Ambulatory Visit: Payer: Self-pay | Admitting: Physician Assistant

## 2017-04-22 NOTE — Telephone Encounter (Signed)
Refill appropriate 

## 2017-05-18 ENCOUNTER — Other Ambulatory Visit: Payer: Self-pay | Admitting: Physician Assistant

## 2017-05-18 NOTE — Telephone Encounter (Signed)
Drug registry reviewed. The only Rx on there is for 12/23/16 for hydrocodone 10/325 #30. Prescribed by me. Refill is approved but I will decrease the dose slightly down to 7.5 mg. Hydrocodone acetaminophen 7.5/325 one by mouth every 6 hours prn severe pain #30+0.

## 2017-05-18 NOTE — Telephone Encounter (Signed)
Last OV 5/21 Last refill 12/22/2016 Ok to refill? D/R printed and placed on Pcp desk

## 2017-05-18 NOTE — Telephone Encounter (Signed)
Patient stopped by office requesting a refill on his hydrocod/acetam 10-325 mg tab he uses [facility]  CB# [phone]

## 2017-05-19 NOTE — Telephone Encounter (Signed)
Tried calling pt no VM and no answer Rx can be picked up after 2 pm today

## 2017-05-19 NOTE — Telephone Encounter (Signed)
patient would like to know why the dose has been changed?

## 2017-05-20 MED ORDER — HYDROCODONE-ACETAMINOPHEN 7.5-325 MG PO TABS: 1.0000 | ORAL_TABLET | Freq: Four times a day (QID) | ORAL | 0 refills | Status: DC | PRN

## 2017-05-20 NOTE — Telephone Encounter (Signed)
The 10mg  dose is the very strongest dose there is.  This med is addictive, strong medication --- he only uses pain med intermittently--should not be requiring such strong dose at this point---

## 2017-05-20 NOTE — Telephone Encounter (Signed)
Called pt no answer no VM Rx can be picked up after 2pm today

## 2017-05-27 ENCOUNTER — Ambulatory Visit
Admission: RE | Admit: 2017-05-27 | Discharge: 2017-05-27 | Disposition: A | Discharge status: Home / Self Care | Payer: PPO | Source: Ambulatory Visit | Attending: Physician Assistant | Admitting: Physician Assistant

## 2017-05-27 ENCOUNTER — Ambulatory Visit (INDEPENDENT_AMBULATORY_CARE_PROVIDER_SITE_OTHER): Payer: PPO | Admitting: Physician Assistant

## 2017-05-27 ENCOUNTER — Encounter: Payer: Self-pay | Admitting: Physician Assistant

## 2017-05-27 VITALS — BP 118/68 | HR 84 | Temp 97.6°F | Resp 16 | Ht 68.0 in | Wt 203.8 lb

## 2017-05-27 DIAGNOSIS — M79672 Pain in left foot: Secondary | ICD-10-CM | POA: Diagnosis not present

## 2017-05-27 DIAGNOSIS — E7800 Pure hypercholesterolemia, unspecified: Secondary | ICD-10-CM

## 2017-05-27 DIAGNOSIS — Z9889 Other specified postprocedural states: Secondary | ICD-10-CM | POA: Diagnosis not present

## 2017-05-27 DIAGNOSIS — E1140 Type 2 diabetes mellitus with diabetic neuropathy, unspecified: Secondary | ICD-10-CM | POA: Diagnosis not present

## 2017-05-27 DIAGNOSIS — I10 Essential (primary) hypertension: Secondary | ICD-10-CM | POA: Diagnosis not present

## 2017-05-27 DIAGNOSIS — M25561 Pain in right knee: Secondary | ICD-10-CM

## 2017-05-27 NOTE — Progress Notes (Signed)
Patient ID: Christian [name] MRN: [id number], DOB: [birth year], 82 y.o. Date of Encounter: @DATE @  Chief Complaint:  No chief complaint on file.   HPI: 82 y.o. year old male  presents for routine 3 months f/u OV to f/u DM, HTN, and other chronic medical problems.   Hypertension: 05/27/2017: Taking meds as directed. No adverse effects.  Hyperlipidemia: 05/27/2017: On Zocor 40mg  and on Niaspan. No myalgias or other adverse medication effects.  Diabetes: On metformin, Glucotrol, Januvia. 03/2014 he was having lower extremity edema so Actos was stopped and was changed to [place]. Also --have added Invokana, but unable to afford- 08/2016: Brought blood sugar log's from August September and October with him today. He has been checking blood sugar 3 times a day almost every day. Fasting morning readings do fluctuate quite a bit but most readings are consistent with the following readings: 126, 167, 124, 165, 188, 170, 144, 1:30. He does have a few readings in the 70s. He does have a few readings higher such as 200, 203, 211. He has also sometimes checks 2 hours after breakfast. These readings are around 160, 164, 240, 146, 167 Before lunch: 118, 124, 119, 106, 142, 185, 165 2 hours after lunch: 194, 164, 178, 165 Before dinner: 139, 140, 80, 144, 172, 159, 110 2 hours after dinner 182, 153, when 99, 156  05/27/2017: He did not bring BS log with him today.   In the past we had difficulty getting his diabetes controlled and he was very noncompliant with diet. All of this information is detailed and documented in my note 05/21/16. See that note for details of all of the past information.  05/27/2017: Today patient comes in-- walking and using crutches. He states that he fell on Saturday at his house. Says that he had gone through sliding door to the porch barefoot. Says he did not trip on anything. Says that his right knee gave out. Says he fell to both knees " in praying position" Says that  even prior to that incident his right knee had been giving way but "hates going to doctors". He is wearing a brace on his right knee today. Has tenderness with palpation to the medial joint line on exam. Reports that he has been feeling pain in that area even prior to this injury. When he fell he also hurt his left foot. Says that he has bruising and pain there. No other areas of pain or bruising.     Past Medical History:  Diagnosis Date  . Arthritis   . Diabetes mellitus    Type 2  . Diabetic neuropathy ([place])   . History of kidney stones   . HTN (hypertension) 03/03/2013  . Hypercholesterolemia   . 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 Skin cancer of arm 04/20/2013   left upper arm  . Spinal stenosis 05/17/2014   L3-L4   . Urgency of urination      Home Meds: Outpatient Medications Prior to Visit  Medication Sig Dispense Refill  . acetaminophen (TYLENOL) 500 MG tablet Take 1,000 mg by mouth every 8 (eight) hours as needed for mild pain or moderate pain (back pain).    Marland Kitchen aspirin EC 81 MG tablet Take 81 mg by mouth daily.      . cholecalciferol (VITAMIN D) 1000 UNITS tablet Take 1,000 Units by mouth daily.      . Cyanocobalamin (B-12 PO) Take 1 tablet by mouth daily.    . fish oil-omega-3 fatty acids 1000 MG capsule  Take 3 g by mouth 2 (two) times daily.      Marland Kitchen gabapentin (NEURONTIN) 600 MG tablet TAKE ONE TABLET BY MOUTH THREE TIMES DAILY 90 tablet 3  . glipiZIDE (GLUCOTROL) 10 MG tablet TAKE ONE TABLET BY MOUTH TWICE DAILY BEFORE  A  MEAL 180 tablet 3  . HYDROcodone-acetaminophen (NORCO) 7.5-325 MG tablet Take 1 tablet by mouth every 6 (six) hours as needed for moderate pain. 30 tablet 0  . lisinopril (PRINIVIL,ZESTRIL) 10 MG tablet Take 1 tablet (10 mg total) by mouth daily. 90 tablet 0  . metFORMIN (GLUCOPHAGE) 1000 MG tablet TAKE ONE TABLET BY MOUTH TWICE DAILY WITH MEALS 180 tablet 3  . Multiple Vitamin (MULITIVITAMIN WITH MINERALS) TABS Take 1 tablet by mouth daily.        . niacin (NIASPAN) 500 MG CR tablet TAKE 1 TABLET BY MOUTH AT BEDTIME 90 tablet 0  . ONE TOUCH ULTRA TEST test strip USE TO CHECK FASTING BLOOD SUGARS THREE TIMES DAILY 100 each 11  . potassium gluconate 595 MG TABS tablet Take 595 mg by mouth daily.    . simvastatin (ZOCOR) 40 MG tablet TAKE ONE TABLET BY MOUTH ONCE DAILY WITH BREAKFAST. 90 tablet 0  . sitaGLIPtin (JANUVIA) 100 MG tablet Take 1 tablet (100 mg total) by mouth daily. 30 tablet 3   No facility-administered medications prior to visit.     Allergies:  Allergies  Allergen Reactions  . Actos [Pioglitazone]     LE Edema--03/2014    Social History   Social History  . Marital status: Divorced    Spouse name: N/A  . Number of children: N/A  . Years of education: N/A   Occupational History  . Not on file.   Social History Main Topics  . Smoking status: Former Smoker    Quit date: 03/04/2003  . Smokeless tobacco: Never Used  . Alcohol use No  . Drug use: No  . Sexual activity: Not on file   Other Topics Concern  . Not on file   Social History Narrative  . No narrative on file    No family history on file.   Review of Systems:  See HPI for pertinent ROS. All other ROS negative.    Physical Exam: Blood pressure 118/68, pulse 84, temperature 97.6 F (36.4 C), temperature source Oral, resp. rate 16, height 5\' 8"  (1.727 m), weight 203 lb 12.8 oz (92.4 kg), SpO2 98 %., There is no height or weight on file to calculate BMI. General: WNWD WM. Appears in no acute distress. Neck: Supple. No thyromegaly. No lymphadenopathy. No carotid bruit. Lungs: Clear bilaterally to auscultation without wheezes, rales, or rhonchi. Breathing is unlabored. Heart: RRR with S1 S2. No murmurs, rubs, or gallops. Abdomen: Soft, non-tender, non-distended with normoactive bowel sounds. No hepatomegaly. No rebound/guarding. No obvious abdominal masses. Musculoskeletal:  Strength and tone normal for age. Right Knee: Inspection is normal. He  has tenderness with palpation along the medial joint line. Left foot: Plantar surface appears normal on inspection. On dorsal surface----at area of base of the second through fifth toes there is area of deep purple ecchymosis. Extremities/Skin: Warm and dry. Trace lower extremity edema. Neuro: Alert and oriented X 3. Moves all extremities spontaneously. Gait is normal. CNII-XII grossly in tact. Psych:  Responds to questions appropriately with a normal affect. Diabetic foot exam: Inspection: On the dorsal aspect of his feet he has a lot of scaly skin. No open wounds or sores. Sensation is intact. Plantar surface of his  feet look good. No calluses or wounds. Trace left DP pulse. Trace right DP pulse. No palpable posterior tibial pulses bilaterally.      ASSESSMENT AND PLAN:  82 y.o. year old male with     Acute pain of right knee 05/27/2017--Continue wearing sleeve on knee. Continue crutches to avoid weightbearing. I have ordered a referral to orthopedics and marked this urgent. - Ambulatory referral to Orthopedic Surgery  Left foot pain 05/27/2017---He is to go directly to [facility] imaging for x-ray. I will follow-up with him with report findings. The interim continue crutches and avoid weightbearing. - DG Foot Complete Left; Future   Essential hypertension 05/27/2017: Blood pressure is at goal/controlled. He is on ACE inhibitor. Labs have been stable and have been monitoring these routinely.  Type 2 diabetes mellitus without complication, without long-term current use of insulin ([place])  08/21/2016- - Microalbumin, urine 05/27/2017---Check A1C, CMET, FLP  not able to afford the Invokana.IS NOT TAKING INVOKANA. In past we did patient assistance forms with the pharmaceutical company and they were not able to provide him with assistance. also there are notes in Epic from [facility] regarding this  Januvia is extremely expensive--- I give him samples when possible 11/26/2016--gave 6 weeks of  samples of Januvia 02/23/2017--gave 4 weeks of samples of Januvia 05/27/2017--gave 4 weeks of samples of Januvia  H/O LE Edema with Actos  Watch Kidney Function regarding Metformin and Januvia  He does have annual eye exam.  On ASA 81mg  QD On statin. On ACE inhibitor.  Has had Pneumovax 23 in past. Had Prevnar 13-- 12/2013.   Diabetic mononeuropathy associated with diabetes mellitus due to underlying condition ([place]) 05/27/2017---Stable/controlled with current dose of Neurontin  Hypercholesterolemia 05/27/2017---He is on Statin and Niaspan. Last FLP at goal. LFTs normal. Cont current meds.  Bilateral lower extremity edema 05/27/2017---He uses HCTZ 12.5mg  PRN swelling--no longer having to use daily.    screening PSA: Normal 07/2011. 05/27/2017---Will not repeat given advanced age   screening colonoscopy: At recent office visits we have discussed that this was due.  At visit 06/2014 he says that he needs to get his back pain control prior to scheduling this. Given age 63, can probably stop these--will discuss with pt Discussed colonoscpoy with patient at visit 04/12/15. He does seem interested in wanting to have continued follow-up colonoscopies. However at today's visit he just says it to much is going on and for [place] to rediscuss it at his next visit once things settle down. Discussed visit 05/21/16 and he is agreeable for me to place order for referral to GI for follow-up colonoscopy.---Ordered referrral to GI -- 05/21/2016 08/21/2016---Has colonoscopy scheduled this upcoming week.  11/26/2016--He reports that Dr. Kalman Shan gave him hemeOccults cards to turn in --did not have to do colonoscopy  Immunizations: Pneumonia vaccine: Received Pneumovax here 05/01/2008. Updated with Prevnar 13 12/2013.Received 06/23/2016 Tetanus: Given here 07/23/2011 Zostavax: He checked with his insurance-- cost would be $200 patient defers. Shingrix---05/27/2017----currently on back-order---will discuss in  future--when available Influenza Vaccine: Given here 08/06/2015  Regular office visit 3 months. Sooner if needed.    Marin Olp [place], [place], [facility] 05/27/2017 7:54 AM

## 2017-05-28 LAB — COMPLETE METABOLIC PANEL WITH GFR
ALT: 16 U/L (ref 9–46)
AST: 17 U/L (ref 10–35)
Albumin: 4.3 g/dL (ref 3.6–5.1)
Alkaline Phosphatase: 48 U/L (ref 40–115)
BILIRUBIN TOTAL: 0.6 mg/dL (ref 0.2–1.2)
BUN: 28 mg/dL — AB (ref 7–25)
CO2: 21 mmol/L (ref 20–32)
CREATININE: 1.57 mg/dL — AB (ref 0.70–1.11)
Calcium: 9.7 mg/dL (ref 8.6–10.3)
Chloride: 105 mmol/L (ref 98–110)
GFR, EST AFRICAN AMERICAN: 47 mL/min — AB (ref >=60)
GFR, Est Non African American: 41 mL/min — ABNORMAL LOW (ref >=60)
GLUCOSE: 146 mg/dL — AB (ref 70–99)
Potassium: 4.8 mmol/L (ref 3.5–5.3)
Sodium: 140 mmol/L (ref 135–146)
TOTAL PROTEIN: 6.6 g/dL (ref 6.1–8.1)

## 2017-05-28 LAB — LIPID PANEL
CHOL/HDL RATIO: 3.3 ratio (ref <5.0)
Cholesterol: 130 mg/dL (ref <200)
HDL: 39 mg/dL — ABNORMAL LOW (ref >40)
LDL CALC: 62 mg/dL (ref <100)
Triglycerides: 147 mg/dL (ref <150)
VLDL: 29 mg/dL (ref <30)

## 2017-05-28 LAB — HEMOGLOBIN A1C
Hgb A1c MFr Bld: 6.3 % — ABNORMAL HIGH (ref <5.7)
MEAN PLASMA GLUCOSE: 134 mg/dL

## 2017-06-03 ENCOUNTER — Ambulatory Visit (INDEPENDENT_AMBULATORY_CARE_PROVIDER_SITE_OTHER): Payer: PPO | Admitting: Physician Assistant

## 2017-06-03 ENCOUNTER — Ambulatory Visit (INDEPENDENT_AMBULATORY_CARE_PROVIDER_SITE_OTHER): Payer: PPO

## 2017-06-03 DIAGNOSIS — G8929 Other chronic pain: Secondary | ICD-10-CM

## 2017-06-03 DIAGNOSIS — M1711 Unilateral primary osteoarthritis, right knee: Secondary | ICD-10-CM | POA: Diagnosis not present

## 2017-06-03 DIAGNOSIS — M170 Bilateral primary osteoarthritis of knee: Secondary | ICD-10-CM | POA: Diagnosis not present

## 2017-06-03 DIAGNOSIS — M25561 Pain in right knee: Secondary | ICD-10-CM

## 2017-06-03 MED ORDER — METHYLPREDNISOLONE ACETATE 40 MG/ML IJ SUSP
40.0000 mg | INTRAMUSCULAR | Status: AC | PRN
  Administered 2017-06-03: 40 mg via INTRA_ARTICULAR

## 2017-06-03 MED ORDER — BUPIVACAINE HCL 0.25 % IJ SOLN
4.0000 mL | INTRAMUSCULAR | Status: AC | PRN
Start: 2017-06-03 — End: 2017-06-03
  Administered 2017-06-03: 4 mL via INTRA_ARTICULAR

## 2017-06-03 NOTE — Progress Notes (Signed)
Office Visit Note   Patient: Christian [name]           Date of Birth: October 03, 1935           MRN: [id number] Visit Date: 06/03/2017              Requested by: Orlena Sheldon, PA-C [address], [place] [address] PCP: Orlena Sheldon, PA-C   Assessment & Plan: Visit Diagnoses:  1. Chronic pain of right knee     Plan: He'll work on strengthening the right knee. Also is to monitor his glucose levels over the next few days. Follow with [place] in 2 weeks check his progress lack of.  Follow-Up Instructions: Return in about 2 weeks (around 06/17/2017).   Orders:  Orders Placed This Encounter  Procedures  . Large Joint Injection/Arthrocentesis  . XR KNEE 3 VIEW RIGHT   No orders of the defined types were placed in this encounter.     Procedures: Large Joint Inj Date/Time: 06/03/2017 4:43 PM Performed by: Pete Pelt Authorized by: Pete Pelt   Consent Given by:  Patient Indications:  Pain Location:  Knee Site:  R knee Needle Size:  22 G Approach:  Superolateral Ultrasound Guidance: No   Fluoroscopic Guidance: No   Medications:  40 mg methylPREDNISolone acetate 40 MG/ML; 4 mL bupivacaine 0.25 % Aspiration Attempted: Yes   Aspirate amount (mL):  8 Aspirate:  Blood-tinged Patient tolerance:  Patient tolerated the procedure well with no immediate complications     Clinical Data: No additional findings.   Subjective: Chief Complaint  Patient presents with  . Right Knee - Pain    HPI Christian [name] is an 82 year old male comes in today for the first time for right knee pain. Knees been bothering for the past 2 weeks. He had some pain in the knee priorto nothing like the pain is having now. He is states at times still he has no real pain in the knee but if he twisted knee the way he has pain in no mechanical symptoms. He is had no treatment for it. He is diabetic so last hemoglobin A1c was 6.3. He ambulates with a cane due to the knee pain. Review of  Systems Please see history of present illness otherwise negative.  Objective: Vital Signs: There were no vitals taken for this visit.  Physical Exam  Constitutional: He is oriented to person, place, and time. He appears well-developed and well-nourished. No distress.  Neurological: He is alert and oriented to person, place, and time.  Skin: He is not diaphoretic.  Psychiatric: He has a normal mood and affect.    Ortho Exam Bilateral knees he has good range of motion. No instability valgus varus stressing. Slight effusion right knee no effusion left knee. Crepitus with passive range of motion of both knees. No abnormal warmth or erythema. Specialty Comments:  No specialty comments available.  Imaging: Xr Knee 3 View Right  Result Date: 06/03/2017 Right knee AP lateral sunrise view: No acute fractures. Bone-on-bone medial compartment and severe patellofemoral changes. Mild to moderate lateral compartmental narrowing. Left knee on the AP view does show moderately severe medial compartmental narrowing.    PMFS History: Patient Active Problem List   Diagnosis Date Noted  . S/P lumbar laminectomy 11/22/2014  . 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 Bilateral lower extremity edema 06/14/2014  . Low back pain 03/13/2014  . Bilateral pseudophakia 06/27/2013  . Diabetic neuropathy ([place])   .  Skin cancer of arm 04/20/2013  . Type 2 diabetes mellitus with diabetic neuropathy, without long-term current use of insulin ([place]) 03/03/2013  . HTN (hypertension) 03/03/2013  . Hypercholesterolemia    Past Medical History:  Diagnosis Date  . Arthritis   . Diabetes mellitus    Type 2  . Diabetic neuropathy ([place])   . History of kidney stones   . HTN (hypertension) 03/03/2013  . Hypercholesterolemia   . 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 Skin cancer of arm 04/20/2013   left upper arm  . Spinal stenosis 05/17/2014   L3-L4   . Urgency of urination     No  family history on file.  Past Surgical History:  Procedure Laterality Date  . LUMBAR LAMINECTOMY/DECOMPRESSION MICRODISCECTOMY Bilateral 11/22/2014   Procedure: Laminectomy and foraminotomy lumbar three-lumbar four bilateral ;  Surgeon: Eustace Moore, MD;  Location: [facility];  Service: Neurosurgery;  Laterality: Bilateral;  . SKIN CANCER EXCISION Left    shoulder and hand   Social History   Occupational History  . Not on file.   Social History Main Topics  . Smoking status: Former Smoker    Quit date: 03/04/2003  . Smokeless tobacco: Never Used  . Alcohol use No  . Drug use: No  . Sexual activity: Not on file

## 2017-06-22 ENCOUNTER — Ambulatory Visit (INDEPENDENT_AMBULATORY_CARE_PROVIDER_SITE_OTHER): Payer: PPO | Admitting: Physician Assistant

## 2017-06-22 ENCOUNTER — Encounter (INDEPENDENT_AMBULATORY_CARE_PROVIDER_SITE_OTHER): Payer: Self-pay | Admitting: Physician Assistant

## 2017-06-22 DIAGNOSIS — M1711 Unilateral primary osteoarthritis, right knee: Secondary | ICD-10-CM | POA: Diagnosis not present

## 2017-06-22 NOTE — Progress Notes (Signed)
Christian [name] returns today follow-up of his right knee status post cortisone injection 2 weeks ago. States the injection helped lateral pain in this knee but did not really help much with the medial pain. Again he has bone-on-bone arthritis needle compartment and significant patellofemoral arthritis. He is diabetic well-controlled with a hemoglobin A1c 3 weeks ago 6.3.  Physical exam right knee he lacks last few degrees of full extension and good flexion. He has tenderness along medial joint line. No effusion abnormal warmth erythema. No laxity with valgus varus stressing. He ambulates with a cane in his left hand.  Impression:Right knee end-stage  Osteoarthritis  Plan: Discussed with Christian [name] at length today that his right knee osteoarthritis with not most likely respond to a supplemental injection in the knee. Recommend right total knee arthroplasty if he is having pain that is interfering with his life the point that he cannot do activities he would like to do. Discussed risk involved with a knee replacement which included but are not limited to wound healing problems, DVT/PE, prolonged pain and worsening pain. Right total knee arthroplasty components were shown to the patient today. Discussed at length with him the importance of physical therapy postop the and the fact that he will need someone with him further at least the first few days after surgery in fact he wanted to go home versus a skilled facility which he does not wish to go to after surgery. He'll discuss possible right total knee arthroplasty with his family and get back with [place] is given Sherri Billing's if he wishes to proceed with surgery he will call her. Questions encouraged and answered length today by myself and Dr. Ninfa Linden.

## 2017-07-06 ENCOUNTER — Other Ambulatory Visit: Payer: Self-pay | Admitting: Physician Assistant

## 2017-07-23 ENCOUNTER — Other Ambulatory Visit: Payer: Self-pay | Admitting: Physician Assistant

## 2017-07-23 NOTE — Telephone Encounter (Signed)
Patient calling to get rx for his hydrocodone

## 2017-07-24 NOTE — Telephone Encounter (Signed)
Last Ov 8/22 Last refill 8/15 Ok to refill

## 2017-07-27 MED ORDER — HYDROCODONE-ACETAMINOPHEN 7.5-325 MG PO TABS: 1.0000 | ORAL_TABLET | Freq: Four times a day (QID) | ORAL | 0 refills | Status: DC | PRN

## 2017-07-27 NOTE — Telephone Encounter (Signed)
Approved. # 30 + 0. 

## 2017-07-27 NOTE — Telephone Encounter (Signed)
RX can be picked up after 2pm today . Patient aware via VM

## 2017-08-04 ENCOUNTER — Ambulatory Visit (INDEPENDENT_AMBULATORY_CARE_PROVIDER_SITE_OTHER): Payer: PPO

## 2017-08-04 DIAGNOSIS — Z23 Encounter for immunization: Secondary | ICD-10-CM | POA: Diagnosis not present

## 2017-08-04 NOTE — Progress Notes (Signed)
Patient was seen in office for flu vaccine.Patient received vaccine in right deltoid.patient tolerated well  

## 2017-08-12 DIAGNOSIS — E119 Type 2 diabetes mellitus without complications: Secondary | ICD-10-CM | POA: Diagnosis not present

## 2017-08-12 LAB — HM DIABETES EYE EXAM

## 2017-08-17 ENCOUNTER — Other Ambulatory Visit: Payer: Self-pay | Admitting: Physician Assistant

## 2017-08-19 ENCOUNTER — Encounter: Payer: Self-pay | Admitting: Family Medicine

## 2017-08-24 ENCOUNTER — Ambulatory Visit: Payer: PPO | Admitting: Physician Assistant

## 2017-08-26 ENCOUNTER — Encounter: Payer: Self-pay | Admitting: Physician Assistant

## 2017-09-02 ENCOUNTER — Other Ambulatory Visit: Payer: Self-pay

## 2017-09-02 ENCOUNTER — Encounter: Payer: Self-pay | Admitting: Physician Assistant

## 2017-09-02 ENCOUNTER — Ambulatory Visit: Payer: PPO | Admitting: Physician Assistant

## 2017-09-02 VITALS — BP 106/60 | HR 61 | Temp 97.6°F | Resp 18 | Ht 68.0 in | Wt 208.2 lb

## 2017-09-02 DIAGNOSIS — N183 Chronic kidney disease, stage 3 unspecified: Secondary | ICD-10-CM | POA: Insufficient documentation

## 2017-09-02 DIAGNOSIS — R600 Localized edema: Secondary | ICD-10-CM | POA: Diagnosis not present

## 2017-09-02 DIAGNOSIS — E7800 Pure hypercholesterolemia, unspecified: Secondary | ICD-10-CM | POA: Diagnosis not present

## 2017-09-02 DIAGNOSIS — E1140 Type 2 diabetes mellitus with diabetic neuropathy, unspecified: Secondary | ICD-10-CM

## 2017-09-02 DIAGNOSIS — Z9889 Other specified postprocedural states: Secondary | ICD-10-CM

## 2017-09-02 DIAGNOSIS — I10 Essential (primary) hypertension: Secondary | ICD-10-CM | POA: Diagnosis not present

## 2017-09-02 NOTE — Progress Notes (Signed)
Patient ID: Christian [name] MRN: [id number], DOB: 1935-08-28, 82 y.o. Date of Encounter: @DATE @  Chief Complaint:  Chief Complaint  Patient presents with  . 3 month follow up    HPI: 82 y.o. year old male  presents for routine 3 months f/u OV to f/u DM, HTN, and other chronic medical problems.   Hypertension: 09/02/2017: Taking meds as directed. No adverse effects.  Hyperlipidemia: 09/02/2017: On Zocor 40mg  and on Niaspan. No myalgias or other adverse medication effects.  Diabetes: On metformin, Glucotrol, Januvia. 03/2014 he was having lower extremity edema so Actos was stopped and was changed to [place]. Also --have added Invokana, but unable to afford-  In the past we had difficulty getting his diabetes controlled and he was very noncompliant with diet. All of this information is detailed and documented in my note 05/21/16. See that note for details of all of the past information.  09/02/2017: He did not bring in blood sugar log form with him today.  Reviewed with him that his last A1c was good.  Says that he hopes it is good again today.  He says he thinks he has gained a couple pounds.  Says that this cold weather does not help and sitting there watching TV and nothing but commercials of healthy food all makes it difficult.   09/02/2017: I reviewed that at last OV with me--- "05/27/2017:  Today patient comes in-- walking and using crutches. He states that he fell on Saturday at his house. Says that he had gone through sliding door to the porch barefoot. Says he did not trip on anything. Says that his right knee gave out. Says he fell to both knees " in praying position" Says that even prior to that incident his right knee had been giving way but "hates going to doctors". He is wearing a brace on his right knee today. Has tenderness with palpation to the medial joint line on exam. Reports that he has been feeling pain in that area even prior to this injury. When he fell he  also hurt his left foot. Says that he has bruising and pain there. No other areas of pain or bruising." Today--09/02/2017: discussed with him that I saw in the computer that he had had visits with orthopedics but I did not review all of those notes.  Asked him how this is coming along.  He says that they told him he has a lot of arthritis in both knees but especially the right and that he really needs to have surgery on the right knee but he absolutely does not want another surgery.  Says that overall all of these issues related to that fall are now stable.  Says that some days are worse than others.  Says that some days he has to use his cane and others he does not, but overall this is stable.  09/02/2017: He has no specific concerns to address today.     Past Medical History:  Diagnosis Date  . Arthritis   . Diabetes mellitus    Type 2  . Diabetic neuropathy ([place])   . History of kidney stones   . HTN (hypertension) 03/03/2013  . Hypercholesterolemia   . 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 Skin cancer of arm 04/20/2013   left upper arm  . Spinal stenosis 05/17/2014   L3-L4   . Urgency of urination      Home Meds: Outpatient Medications Prior to Visit  Medication Sig Dispense Refill  . acetaminophen (TYLENOL)  500 MG tablet Take 1,000 mg by mouth every 8 (eight) hours as needed for mild pain or moderate pain (back pain).    Marland Kitchen aspirin EC 81 MG tablet Take 81 mg by mouth daily.      . cholecalciferol (VITAMIN D) 1000 UNITS tablet Take 1,000 Units by mouth daily.      . Cyanocobalamin (B-12 PO) Take 1 tablet by mouth daily.    . fish oil-omega-3 fatty acids 1000 MG capsule Take 3 g by mouth 2 (two) times daily.      Marland Kitchen gabapentin (NEURONTIN) 600 MG tablet TAKE 1 TABLET BY MOUTH THREE TIMES DAILY 90 tablet 3  . glipiZIDE (GLUCOTROL) 10 MG tablet TAKE ONE TABLET BY MOUTH TWICE DAILY BEFORE  A  MEAL 180 tablet 3  . HYDROcodone-acetaminophen (NORCO) 7.5-325 MG tablet Take 1 tablet by  mouth every 6 (six) hours as needed for moderate pain. 30 tablet 0  . lisinopril (PRINIVIL,ZESTRIL) 10 MG tablet Take 1 tablet (10 mg total) by mouth daily. 90 tablet 0  . metFORMIN (GLUCOPHAGE) 1000 MG tablet TAKE ONE TABLET BY MOUTH TWICE DAILY WITH MEALS 180 tablet 3  . Multiple Vitamin (MULITIVITAMIN WITH MINERALS) TABS Take 1 tablet by mouth daily.      . niacin (NIASPAN) 500 MG CR tablet TAKE 1 TABLET BY MOUTH AT BEDTIME 90 tablet 0  . ONE TOUCH ULTRA TEST test strip USE TO CHECK FASTING BLOOD SUGARS THREE TIMES DAILY 100 each 11  . potassium gluconate 595 MG TABS tablet Take 595 mg by mouth daily.    . simvastatin (ZOCOR) 40 MG tablet TAKE 1 TABLET BY MOUTH ONCE DAILY WITH  BREAKFAST 90 tablet 0  . sitaGLIPtin (JANUVIA) 100 MG tablet Take 1 tablet (100 mg total) by mouth daily. 30 tablet 3   No facility-administered medications prior to visit.     Allergies:  Allergies  Allergen Reactions  . Actos [Pioglitazone]     LE Edema--03/2014    Social History   Socioeconomic History  . Marital status: Divorced    Spouse name: Not on file  . Number of children: Not on file  . Years of education: Not on file  . Highest education level: Not on file  Social Needs  . Financial resource strain: Not on file  . Food insecurity - worry: Not on file  . Food insecurity - inability: Not on file  . Transportation needs - medical: Not on file  . Transportation needs - non-medical: Not on file  Occupational History  . Not on file  Tobacco Use  . Smoking status: Former Smoker    Last attempt to quit: 03/04/2003    Years since quitting: 14.5  . Smokeless tobacco: Never Used  Substance and Sexual Activity  . Alcohol use: No  . Drug use: No  . Sexual activity: Not on file  Other Topics Concern  . Not on file  Social History Narrative  . Not on file    History reviewed. No pertinent family history.   Review of Systems:  See HPI for pertinent ROS. All other ROS negative.    Physical  Exam: Blood pressure 106/60, pulse 61, temperature 97.6 F (36.4 C), temperature source Oral, resp. rate 18, height 5\' 8"  (1.727 m), weight 94.4 kg (208 lb 3.2 oz), SpO2 97 %., Body mass index is 31.66 kg/m. General: WNWD WM. Appears in no acute distress. Neck: Supple. No thyromegaly. No lymphadenopathy. No carotid bruit. Lungs: Clear bilaterally to auscultation without wheezes, rales, or  rhonchi. Breathing is unlabored. Heart: RRR with S1 S2. No murmurs, rubs, or gallops. Abdomen: Soft, non-tender, non-distended with normoactive bowel sounds. No hepatomegaly. No rebound/guarding. No obvious abdominal masses. Musculoskeletal:  Strength and tone normal for age. Extremities/Skin: Warm and dry. No LE edema. Neuro: Alert and oriented X 3. Moves all extremities spontaneously. Gait is normal. CNII-XII grossly in tact. Psych:  Responds to questions appropriately with a normal affect. Diabetic foot exam: Inspection: On the dorsal aspect of his feet he has a lot of scaly skin. No open wounds or sores. Sensation is intact. Plantar surface of his feet look good. No calluses or wounds. Trace left DP pulse. Trace right DP pulse. No palpable posterior tibial pulses bilaterally.      ASSESSMENT AND PLAN:  82 y.o. year old male with    CKD (chronic kidney disease) stage 3, GFR 30-59 ml/min (HCC) At [place] 09/02/2017: Discussed with him that at his last lab on 05/27/17 his kidney numbers were up a little bit.  Told him that I will recheck those labs today.  Told him that if those numbers remain elevated or worsen then we will have to adjust some medications (metformin and Januvia) He voices understanding. He is on ACE inhibitor which should be providing some renal protection. - BASIC METABOLIC PANEL WITH GFR - Microalbumin, urine  Essential hypertension 09/02/2017: Blood pressure is at goal/controlled. He is on ACE inhibitor.Check labs to monitor.  Type 2 diabetes mellitus without complication, without  long-term current use of insulin (HCC)   not able to afford the Invokana.IS NOT TAKING INVOKANA. In past we did patient assistance forms with the pharmaceutical company and they were not able to provide him with assistance. also there are notes in Epic from [facility] regarding this  Januvia is extremely expensive--- I give him samples when possible 11/26/2016--gave 6 weeks of samples of Januvia 02/23/2017--gave 4 weeks of samples of Januvia 05/27/2017--gave 4 weeks of samples of Januvia 09/02/2017----gave 4 weeks of samples of Januvia H/O LE Edema with Actos  Watch Kidney Function regarding Metformin and Januvia-  He does have annual eye exam.  On ASA 81mg  QD On statin. On ACE inhibitor.  Has had Pneumovax 23 in past. Had Prevnar 13-- 12/2013.   Diabetic mononeuropathy associated with diabetes mellitus due to underlying condition ([place]) 09/02/2017---Stable/controlled with current dose of Neurontin  Hypercholesterolemia 09/02/2017---He is on Statin and Niaspan. Last FLP at goal. LFTs normal. Cont current meds.  Bilateral lower extremity edema 09/02/2017---He uses HCTZ 12.5mg  PRN swelling--no longer having to use daily.    screening PSA: Normal 07/2011. 05/27/2017---Will not repeat given advanced age   screening colonoscopy: At recent office visits we have discussed that this was due.  At visit 06/2014 he says that he needs to get his back pain control prior to scheduling this. Given age 60, can probably stop these--will discuss with pt Discussed colonoscpoy with patient at visit 04/12/15. He does seem interested in wanting to have continued follow-up colonoscopies. However at today's visit he just says it to much is going on and for [place] to rediscuss it at his next visit once things settle down. Discussed visit 05/21/16 and he is agreeable for me to place order for referral to GI for follow-up colonoscopy.---Ordered referrral to GI -- 05/21/2016 08/21/2016---Has colonoscopy scheduled this  upcoming week.  11/26/2016--He reports that Dr. Kalman Shan gave him hemeOccults cards to turn in --did not have to do colonoscopy  Immunizations: Pneumonia vaccine: Received Pneumovax here 05/01/2008. Updated with Prevnar 13 12/2013.Received 06/23/2016  Tetanus: Given here 07/23/2011 Zostavax: He checked with his insurance-- cost would be $200 patient defers. Shingrix---05/27/2017----currently on back-order---will discuss in future--when available Influenza Vaccine: Given here 08/06/2015  Regular office visit 3 months. Sooner if needed.    [address] [place], [place], [facility] 09/02/2017 3:39 PM

## 2017-09-03 LAB — BASIC METABOLIC PANEL WITH GFR
BUN/Creatinine Ratio: 20 (calc) (ref 6–22)
BUN: 29 mg/dL — AB (ref 7–25)
CALCIUM: 9.1 mg/dL (ref 8.6–10.3)
CHLORIDE: 108 mmol/L (ref 98–110)
CO2: 23 mmol/L (ref 20–32)
Creat: 1.44 mg/dL — ABNORMAL HIGH (ref 0.70–1.11)
GFR, Est African American: 53 mL/min/{1.73_m2} — ABNORMAL LOW (ref >=60)
GFR, Est Non African American: 46 mL/min/{1.73_m2} — ABNORMAL LOW (ref >=60)
GLUCOSE: 100 mg/dL — AB (ref 65–99)
Potassium: 4.5 mmol/L (ref 3.5–5.3)
Sodium: 140 mmol/L (ref 135–146)

## 2017-09-03 LAB — HEMOGLOBIN A1C
EAG (MMOL/L): 7.7 (calc)
Hgb A1c MFr Bld: 6.5 % of total Hgb — ABNORMAL HIGH (ref <5.7)
Mean Plasma Glucose: 140 (calc)

## 2017-09-03 LAB — MICROALBUMIN, URINE: MICROALB UR: 0.8 mg/dL

## 2017-09-04 ENCOUNTER — Telehealth: Payer: Self-pay

## 2017-09-04 MED ORDER — SITAGLIPTIN PHOSPHATE 50 MG PO TABS: 50.0000 mg | ORAL_TABLET | Freq: Every day | ORAL | 1 refills | Status: DC

## 2017-09-04 NOTE — Telephone Encounter (Signed)
-----   Message from Orlena Sheldon, PA-C sent at 09/03/2017 12:55 PM EST ----- At his office visit yesterday I explained to patient that I was monitoring his kidney numbers and that if they remained elevated would have to adjust some medications.  Tell him that these kidney numbers are still elevated so we need to make the following changes.: 1-discontinue the Metformin.  Remove this from medication list. 2-decrease Januvia to 50 mg daily.--- Remove the Januvia 100 mg from the medicine list.

## 2017-09-04 NOTE — Telephone Encounter (Signed)
Patient aware of medication changes

## 2017-10-12 ENCOUNTER — Other Ambulatory Visit: Payer: Self-pay | Admitting: Physician Assistant

## 2017-10-12 NOTE — Telephone Encounter (Signed)
Refill appropriate 

## 2017-10-29 ENCOUNTER — Other Ambulatory Visit: Payer: Self-pay | Admitting: Physician Assistant

## 2017-11-04 ENCOUNTER — Other Ambulatory Visit: Payer: Self-pay | Admitting: Physician Assistant

## 2017-11-04 MED ORDER — HYDROCODONE-ACETAMINOPHEN 7.5-325 MG PO TABS: 1.0000 | ORAL_TABLET | Freq: Four times a day (QID) | ORAL | 0 refills | Status: DC | PRN

## 2017-11-04 NOTE — Telephone Encounter (Signed)
Last OV 09-02-2017 Last refill 07/27/2017 Okay to refill?

## 2017-11-04 NOTE — Telephone Encounter (Signed)
Rx sent 

## 2017-11-04 NOTE — Telephone Encounter (Signed)
Pt requesting refill on hydrocodone sent to [facility].

## 2017-11-30 ENCOUNTER — Other Ambulatory Visit: Payer: Self-pay | Admitting: Physician Assistant

## 2017-12-03 ENCOUNTER — Other Ambulatory Visit: Payer: Self-pay

## 2017-12-03 ENCOUNTER — Ambulatory Visit (INDEPENDENT_AMBULATORY_CARE_PROVIDER_SITE_OTHER): Payer: PPO | Admitting: Physician Assistant

## 2017-12-03 ENCOUNTER — Encounter: Payer: Self-pay | Admitting: Physician Assistant

## 2017-12-03 VITALS — BP 148/70 | HR 94 | Temp 97.6°F | Resp 16 | Ht 68.0 in | Wt 207.6 lb

## 2017-12-03 DIAGNOSIS — I10 Essential (primary) hypertension: Secondary | ICD-10-CM | POA: Diagnosis not present

## 2017-12-03 DIAGNOSIS — N183 Chronic kidney disease, stage 3 unspecified: Secondary | ICD-10-CM

## 2017-12-03 DIAGNOSIS — M1711 Unilateral primary osteoarthritis, right knee: Secondary | ICD-10-CM | POA: Diagnosis not present

## 2017-12-03 DIAGNOSIS — R600 Localized edema: Secondary | ICD-10-CM | POA: Diagnosis not present

## 2017-12-03 DIAGNOSIS — E1140 Type 2 diabetes mellitus with diabetic neuropathy, unspecified: Secondary | ICD-10-CM

## 2017-12-03 DIAGNOSIS — E7800 Pure hypercholesterolemia, unspecified: Secondary | ICD-10-CM | POA: Diagnosis not present

## 2017-12-03 MED ORDER — EMPAGLIFLOZIN 25 MG PO TABS: 25.0000 mg | ORAL_TABLET | Freq: Every day | ORAL | 3 refills | Status: DC

## 2017-12-03 NOTE — Progress Notes (Signed)
Patient ID: Christian [name] MRN: [id number], DOB: December 06, 1934, 83 y.o. Date of Encounter: @DATE @  Chief Complaint:  Chief Complaint  Patient presents with  . 3 month follow up    HPI: 83 y.o. year old male  presents for routine 3 months f/u OV to f/u DM, HTN, and other chronic medical problems.   Hypertension: 12/03/2017: Taking meds as directed. No adverse effects.  Hyperlipidemia: 12/03/2017: On Zocor 40mg  and on Niaspan. No myalgias or other adverse medication effects.  Diabetes: In the past we had difficulty getting his diabetes controlled and he was very noncompliant with diet. All of this information is detailed and documented in my note 05/21/16. See that note for details of all of the past information. 03/2014 he was having lower extremity edema so Actos was stopped and was changed to [place]. Also --have added Invokana, but unable to afford- At the time of his OV 09/02/2017---He was on:  metformin, Glucotrol, Januvia. Labs done 09/02/17 showed elevated creatinine.  At that time I said to stop metformin and to decrease Januvia to 50 mg. Today--12/03/2017--- he states that when he stopped the metformin and decreased the Januvia, that his blood sugar went up really high and his vision got blurry.   He says that that scared him so he went back on the metformin and went back on the Januvia 100 mg.      Past Medical History:  Diagnosis Date  . Arthritis   . Diabetes mellitus    Type 2  . Diabetic neuropathy ([place])   . History of kidney stones   . HTN (hypertension) 03/03/2013  . Hypercholesterolemia   . 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 Skin cancer of arm 04/20/2013   left upper arm  . Spinal stenosis 05/17/2014   L3-L4   . Urgency of urination      Home Meds: Outpatient Medications Prior to Visit  Medication Sig Dispense Refill  . acetaminophen (TYLENOL) 500 MG tablet Take 1,000 mg by mouth every 8 (eight) hours as needed for mild pain or moderate pain (back  pain).    Marland Kitchen aspirin EC 81 MG tablet Take 81 mg by mouth daily.      . cholecalciferol (VITAMIN D) 1000 UNITS tablet Take 1,000 Units by mouth daily.      . Cyanocobalamin (B-12 PO) Take 1 tablet by mouth daily.    . fish oil-omega-3 fatty acids 1000 MG capsule Take 3 g by mouth 2 (two) times daily.      Marland Kitchen gabapentin (NEURONTIN) 600 MG tablet TAKE 1 TABLET BY MOUTH THREE TIMES DAILY 90 tablet 3  . glipiZIDE (GLUCOTROL) 10 MG tablet TAKE ONE TABLET BY MOUTH TWICE DAILY BEFORE  A  MEAL 180 tablet 3  . HYDROcodone-acetaminophen (NORCO) 7.5-325 MG tablet Take 1 tablet by mouth every 6 (six) hours as needed for moderate pain. 30 tablet 0  . JANUVIA 100 MG tablet TAKE ONE TABLET BY MOUTH ONCE DAILY 90 tablet 0  . lisinopril (PRINIVIL,ZESTRIL) 10 MG tablet Take 1 tablet (10 mg total) by mouth daily. 90 tablet 0  . Multiple Vitamin (MULITIVITAMIN WITH MINERALS) TABS Take 1 tablet by mouth daily.      . niacin (NIASPAN) 500 MG CR tablet TAKE 1 TABLET BY MOUTH AT BEDTIME 90 tablet 0  . ONE TOUCH ULTRA TEST test strip USE TO CHECK FASTING BLOOD SUGARS THREE TIMES DAILY 100 each 11  . potassium gluconate 595 MG TABS tablet Take 595 mg by mouth daily.    Marland Kitchen  simvastatin (ZOCOR) 40 MG tablet TAKE 1 TABLET BY MOUTH ONCE DAILY WITH  BREAKFAST 90 tablet 0  . sitaGLIPtin (JANUVIA) 50 MG tablet Take 1 tablet (50 mg total) by mouth daily. 90 tablet 1  . lisinopril (PRINIVIL,ZESTRIL) 10 MG tablet TAKE 1 TABLET BY MOUTH ONCE DAILY 90 tablet 1   No facility-administered medications prior to visit.     Allergies:  Allergies  Allergen Reactions  . Actos [Pioglitazone]     LE Edema--03/2014    Social History   Socioeconomic History  . Marital status: Divorced    Spouse name: Not on file  . Number of children: Not on file  . Years of education: Not on file  . Highest education level: Not on file  Social Needs  . Financial resource strain: Not on file  . Food insecurity - worry: Not on file  . Food insecurity  - inability: Not on file  . Transportation needs - medical: Not on file  . Transportation needs - non-medical: Not on file  Occupational History  . Not on file  Tobacco Use  . Smoking status: Former Smoker    Last attempt to quit: 03/04/2003    Years since quitting: 14.7  . Smokeless tobacco: Never Used  Substance and Sexual Activity  . Alcohol use: No  . Drug use: No  . Sexual activity: Not on file  Other Topics Concern  . Not on file  Social History Narrative  . Not on file    History reviewed. No pertinent family history.   Review of Systems:  See HPI for pertinent ROS. All other ROS negative.    Physical Exam: Blood pressure (!) 148/70, pulse 94, temperature 97.6 F (36.4 C), temperature source Oral, resp. rate 16, height 5\' 8"  (1.727 m), weight 94.2 kg (207 lb 9.6 oz), SpO2 98 %., Body mass index is 31.57 kg/m. General: WNWD WM. Appears in no acute distress. Neck: Supple. No thyromegaly. No lymphadenopathy. No carotid bruit. Lungs: Clear bilaterally to auscultation without wheezes, rales, or rhonchi. Breathing is unlabored. Heart: RRR with S1 S2. No murmurs, rubs, or gallops. Abdomen: Soft, non-tender, non-distended with normoactive bowel sounds. No hepatomegaly. No rebound/guarding. No obvious abdominal masses. Musculoskeletal:  Strength and tone normal for age. Extremities/Skin: Warm and dry. No LE edema. Neuro: Alert and oriented X 3. Moves all extremities spontaneously. Gait is normal. CNII-XII grossly in tact. Psych:  Responds to questions appropriately with a normal affect. Diabetic foot exam: Inspection: On the dorsal aspect of his feet he has a lot of scaly skin. No open wounds or sores. Sensation is intact. Plantar surface of his feet look good. No calluses or wounds. Trace left DP pulse. Trace right DP pulse. No palpable posterior tibial pulses bilaterally.      ASSESSMENT AND PLAN:  83 y.o. year old male with    CKD (chronic kidney disease) stage 3, GFR  30-59 ml/min (HCC) At [place] 09/02/2017: Discussed with him that at his last lab on 05/27/17 his kidney numbers were up a little bit.  Told him that I will recheck those labs today.  Told him that if those numbers remain elevated or worsen then we will have to adjust some medications (metformin and Januvia) He voices understanding. He is on ACE inhibitor which should be providing some renal protection. - BASIC METABOLIC PANEL WITH GFR - Microalbumin, urine At OV 12/03/2017: Have told him to stop the metformin and decrease the Januvia to 50 mg.  Will continue the Glucotrol XL 10  mg daily.   To try to keep his blood sugar controlled with having to stop the metformin and decrease the Januvia,  I am adding Jardiance 25 mg every morning. I will have him come in for follow-up visit on Monday to make sure he has been able to make these medicine changes and to make sure that his blood sugar is controlled.    Essential hypertension 12/03/2017: Blood pressure is at goal/controlled. He is on ACE inhibitor.Check labs to monitor.    Type 2 diabetes mellitus without complication, without long-term current use of insulin (HCC)  not able to afford the Invokana.IS NOT TAKING INVOKANA. In past we did patient assistance forms with the pharmaceutical company and they were not able to provide him with assistance. also there are notes in Epic from [facility] regarding this  Januvia is extremely expensive--- I give him samples when possible 11/26/2016--gave 6 weeks of samples of Januvia 02/23/2017--gave 4 weeks of samples of Januvia 05/27/2017--gave 4 weeks of samples of Januvia 09/02/2017----gave 4 weeks of samples of Januvia H/O LE Edema with Actos  08/2017---Watch Kidney Function regarding Metformin and Januvia- At [place] 12/03/2017: Have told him to stop the metformin and decrease the Januvia to 50 mg.  Will continue the Glucotrol XL 10 mg daily.   To try to keep his blood sugar controlled with having to stop the metformin and  decrease the Januvia,  I am adding Jardiance 25 mg every morning. I will have him come in for follow-up visit on Monday to make sure he has been able to make these medicine changes and to make sure that his blood sugar is controlled.  He does have annual eye exam.  On ASA 81mg  QD On statin. On ACE inhibitor.  Has had Pneumovax 23 in past. Had Prevnar 13-- 12/2013.   Diabetic mononeuropathy associated with diabetes mellitus due to underlying condition ([place]) 12/03/2017---Stable/controlled with current dose of Neurontin  Hypercholesterolemia 12/03/2017---He is on Statin and Niaspan. Last FLP at goal. LFTs normal. Cont current meds.  Bilateral lower extremity edema 12/03/2017---He uses HCTZ 12.5mg  PRN swelling--no longer having to use daily.    screening PSA: Normal 07/2011. 05/27/2017---Will not repeat given advanced age   screening colonoscopy: At recent office visits we have discussed that this was due.  At visit 06/2014 he says that he needs to get his back pain control prior to scheduling this. Given age 46, can probably stop these--will discuss with pt Discussed colonoscpoy with patient at visit 04/12/15. He does seem interested in wanting to have continued follow-up colonoscopies. However at today's visit he just says it to much is going on and for [place] to rediscuss it at his next visit once things settle down. Discussed visit 05/21/16 and he is agreeable for me to place order for referral to GI for follow-up colonoscopy.---Ordered referrral to GI -- 05/21/2016 08/21/2016---Has colonoscopy scheduled this upcoming week.  11/26/2016--He reports that Dr. Kalman Shan gave him hemeOccults cards to turn in --did not have to do colonoscopy  Immunizations: Pneumonia vaccine: Received Pneumovax here 05/01/2008. Updated with Prevnar 13 12/2013.Received 06/23/2016 Tetanus: Given here 07/23/2011 Zostavax: He checked with his insurance-- cost would be $200 patient  defers. Shingrix---05/27/2017----currently on back-order---will discuss in future--when available Influenza Vaccine: Given here 08/06/2015      Signed, Karis Juba, [place], [facility] 12/03/2017 8:29 AM

## 2017-12-04 DIAGNOSIS — D225 Melanocytic nevi of trunk: Secondary | ICD-10-CM | POA: Diagnosis not present

## 2017-12-04 DIAGNOSIS — L565 Disseminated superficial actinic porokeratosis (DSAP): Secondary | ICD-10-CM | POA: Diagnosis not present

## 2017-12-04 DIAGNOSIS — L570 Actinic keratosis: Secondary | ICD-10-CM | POA: Diagnosis not present

## 2017-12-04 DIAGNOSIS — D692 Other nonthrombocytopenic purpura: Secondary | ICD-10-CM | POA: Diagnosis not present

## 2017-12-04 DIAGNOSIS — L814 Other melanin hyperpigmentation: Secondary | ICD-10-CM | POA: Diagnosis not present

## 2017-12-04 DIAGNOSIS — L820 Inflamed seborrheic keratosis: Secondary | ICD-10-CM | POA: Diagnosis not present

## 2017-12-04 DIAGNOSIS — L821 Other seborrheic keratosis: Secondary | ICD-10-CM | POA: Diagnosis not present

## 2017-12-04 LAB — HEMOGLOBIN A1C
HEMOGLOBIN A1C: 7.4 %{Hb} — AB (ref <5.7)
MEAN PLASMA GLUCOSE: 166 (calc)
eAG (mmol/L): 9.2 (calc)

## 2017-12-04 LAB — COMPLETE METABOLIC PANEL WITH GFR
AG Ratio: 1.8 (calc) (ref 1.0–2.5)
ALBUMIN MSPROF: 4.3 g/dL (ref 3.6–5.1)
ALT: 16 U/L (ref 9–46)
AST: 16 U/L (ref 10–35)
Alkaline phosphatase (APISO): 52 U/L (ref 40–115)
BUN / CREAT RATIO: 25 (calc) — AB (ref 6–22)
BUN: 42 mg/dL — ABNORMAL HIGH (ref 7–25)
CALCIUM: 10.1 mg/dL (ref 8.6–10.3)
CHLORIDE: 108 mmol/L (ref 98–110)
CO2: 26 mmol/L (ref 20–32)
CREATININE: 1.65 mg/dL — AB (ref 0.70–1.11)
GFR, EST AFRICAN AMERICAN: 45 mL/min/{1.73_m2} — AB (ref >=60)
GFR, Est Non African American: 39 mL/min/{1.73_m2} — ABNORMAL LOW (ref >=60)
GLUCOSE: 158 mg/dL — AB (ref 65–99)
Globulin: 2.4 g/dL (calc) (ref 1.9–3.7)
Potassium: 4.9 mmol/L (ref 3.5–5.3)
Sodium: 142 mmol/L (ref 135–146)
TOTAL PROTEIN: 6.7 g/dL (ref 6.1–8.1)
Total Bilirubin: 0.3 mg/dL (ref 0.2–1.2)

## 2017-12-04 LAB — LIPID PANEL
CHOL/HDL RATIO: 3.9 (calc) (ref <5.0)
Cholesterol: 146 mg/dL (ref <200)
HDL: 37 mg/dL — ABNORMAL LOW (ref >40)
LDL Cholesterol (Calc): 82 mg/dL (calc)
Non-HDL Cholesterol (Calc): 109 mg/dL (calc) (ref <130)
TRIGLYCERIDES: 171 mg/dL — AB (ref <150)

## 2017-12-07 ENCOUNTER — Ambulatory Visit (INDEPENDENT_AMBULATORY_CARE_PROVIDER_SITE_OTHER): Payer: PPO | Admitting: Physician Assistant

## 2017-12-07 ENCOUNTER — Other Ambulatory Visit: Payer: Self-pay

## 2017-12-07 ENCOUNTER — Encounter: Payer: Self-pay | Admitting: Physician Assistant

## 2017-12-07 VITALS — BP 150/70 | HR 78 | Temp 97.4°F | Resp 14 | Wt 208.4 lb

## 2017-12-07 DIAGNOSIS — E1140 Type 2 diabetes mellitus with diabetic neuropathy, unspecified: Secondary | ICD-10-CM

## 2017-12-07 DIAGNOSIS — N183 Chronic kidney disease, stage 3 unspecified: Secondary | ICD-10-CM

## 2017-12-07 NOTE — Progress Notes (Signed)
Patient ID: Christian [name] MRN: [id number], DOB: 1935-08-03, 83 y.o. Date of Encounter: @DATE @  Chief Complaint:  Chief Complaint  Patient presents with  . routine follow up    HPI: 83 y.o. year old male  presents for routine 3 months f/u OV to f/u DM, HTN, and other chronic medical problems.   Hypertension: 12/03/2017: Taking meds as directed. No adverse effects.  Hyperlipidemia: 12/03/2017: On Zocor 40mg  and on Niaspan. No myalgias or other adverse medication effects.  Diabetes: In the past we had difficulty getting his diabetes controlled and he was very noncompliant with diet. All of this information is detailed and documented in my note 05/21/16. See that note for details of all of the past information. 03/2014 he was having lower extremity edema so Actos was stopped and was changed to [place]. Also --have added Invokana, but unable to afford- At the time of his OV 81/28/2018---He was on:  metformin, Glucotrol, Januvia. Labs done 09/02/17 showed elevated creatinine.  At that time I said to stop metformin and to decrease Januvia to 50 mg. Today--12/03/2017--- he states that when he stopped the metformin and decreased the Januvia, that his blood sugar went up really high and his vision got blurry.   He says that that scared him so he went back on the metformin and went back on the Januvia 100 mg.  At [place] 12/03/2017: Have told him to stop the metformin and decrease the Januvia to 50 mg.  Will continue the Glucotrol XL 10 mg daily.   To try to keep his blood sugar controlled with having to stop the metformin and decrease the Januvia,  I am adding Jardiance 25 mg every morning. I will have him come in for follow-up visit on Monday to make sure he has been able to make these medicine changes and to make sure that his blood sugar is controlled.   12/07/2017: Today he reports that he has been taking medications as directed at his recent visit 12/03/17. He did stop the metformin and he  did decrease the Januvia down to 50 mg. He did continue the Glucotrol 10 mg daily. He did add the Jardiance 25 mg daily. I asked if he had some blood sugar readings for me to review.  He says " [name], I forgot to even check any blood sugars--- I was so busy adjusting these medicines." Says that the Jardiance cost $45 for him.  Says the Januvia cost him $35. He states that he is feeling fine after making these medicine adjustments.  He is having no adverse effects.     Past Medical History:  Diagnosis Date  . Arthritis   . Diabetes mellitus    Type 2  . Diabetic neuropathy ([place])   . History of kidney stones   . HTN (hypertension) 03/03/2013  . Hypercholesterolemia   . 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 Skin cancer of arm 04/20/2013   left upper arm  . Spinal stenosis 05/17/2014   L3-L4   . Urgency of urination      Home Meds: Outpatient Medications Prior to Visit  Medication Sig Dispense Refill  . acetaminophen (TYLENOL) 500 MG tablet Take 1,000 mg by mouth every 8 (eight) hours as needed for mild pain or moderate pain (back pain).    Marland Kitchen aspirin EC 81 MG tablet Take 81 mg by mouth daily.      . cholecalciferol (VITAMIN D) 1000 UNITS tablet Take 1,000 Units by mouth daily.      Marland Kitchen  Cyanocobalamin (B-12 PO) Take 1 tablet by mouth daily.    . empagliflozin (JARDIANCE) 25 MG TABS tablet Take 25 mg by mouth daily. 30 tablet 3  . fish oil-omega-3 fatty acids 1000 MG capsule Take 3 g by mouth 2 (two) times daily.      Marland Kitchen gabapentin (NEURONTIN) 600 MG tablet TAKE 1 TABLET BY MOUTH THREE TIMES DAILY 90 tablet 3  . glipiZIDE (GLUCOTROL) 10 MG tablet TAKE ONE TABLET BY MOUTH TWICE DAILY BEFORE  A  MEAL 180 tablet 3  . HYDROcodone-acetaminophen (NORCO) 7.5-325 MG tablet Take 1 tablet by mouth every 6 (six) hours as needed for moderate pain. 30 tablet 0  . lisinopril (PRINIVIL,ZESTRIL) 10 MG tablet Take 1 tablet (10 mg total) by mouth daily. 90 tablet 0  . Multiple Vitamin (MULITIVITAMIN  WITH MINERALS) TABS Take 1 tablet by mouth daily.      . niacin (NIASPAN) 500 MG CR tablet TAKE 1 TABLET BY MOUTH AT BEDTIME 90 tablet 0  . ONE TOUCH ULTRA TEST test strip USE TO CHECK FASTING BLOOD SUGARS THREE TIMES DAILY 100 each 11  . potassium gluconate 595 MG TABS tablet Take 595 mg by mouth daily.    . simvastatin (ZOCOR) 40 MG tablet TAKE 1 TABLET BY MOUTH ONCE DAILY WITH  BREAKFAST 90 tablet 0  . sitaGLIPtin (JANUVIA) 50 MG tablet Take 1 tablet (50 mg total) by mouth daily. 90 tablet 1   No facility-administered medications prior to visit.     Allergies:  Allergies  Allergen Reactions  . Actos [Pioglitazone]     LE Edema--03/2014    Social History   Socioeconomic History  . Marital status: Divorced    Spouse name: Not on file  . Number of children: Not on file  . Years of education: Not on file  . Highest education level: Not on file  Social Needs  . Financial resource strain: Not on file  . Food insecurity - worry: Not on file  . Food insecurity - inability: Not on file  . Transportation needs - medical: Not on file  . Transportation needs - non-medical: Not on file  Occupational History  . Not on file  Tobacco Use  . Smoking status: Former Smoker    Last attempt to quit: 03/04/2003    Years since quitting: 14.7  . Smokeless tobacco: Never Used  Substance and Sexual Activity  . Alcohol use: No  . Drug use: No  . Sexual activity: Not on file  Other Topics Concern  . Not on file  Social History Narrative  . Not on file    History reviewed. No pertinent family history.   Review of Systems:  See HPI for pertinent ROS. All other ROS negative.    Physical Exam: Blood pressure (!) 150/70, pulse 78, temperature (!) 97.4 F (36.3 C), temperature source Oral, resp. rate 14, weight 94.5 kg (208 lb 6.4 oz), SpO2 99 %., Body mass index is 31.69 kg/m. General: WNWD WM. Appears in no acute distress. Neck: Supple. No thyromegaly. No lymphadenopathy. No carotid  bruit. Lungs: Clear bilaterally to auscultation without wheezes, rales, or rhonchi. Breathing is unlabored. Heart: RRR with S1 S2. No murmurs, rubs, or gallops. Abdomen: Soft, non-tender, non-distended with normoactive bowel sounds. No hepatomegaly. No rebound/guarding. No obvious abdominal masses. Musculoskeletal:  Strength and tone normal for age. Extremities/Skin: Warm and dry. No LE edema. Neuro: Alert and oriented X 3. Moves all extremities spontaneously. Gait is normal. CNII-XII grossly in tact. Psych:  Responds to  questions appropriately with a normal affect. Diabetic foot exam: Inspection: On the dorsal aspect of his feet he has a lot of scaly skin. No open wounds or sores. Sensation is intact. Plantar surface of his feet look good. No calluses or wounds. Trace left DP pulse. Trace right DP pulse. No palpable posterior tibial pulses bilaterally.      ASSESSMENT AND PLAN:  83 y.o. year old male with    1. Type 2 diabetes mellitus with diabetic neuropathy, without long-term current use of insulin ([place])  2. CKD (chronic kidney disease) stage 3, GFR 30-59 ml/min (HCC)   12/07/2017:For him the Vania Rea is costing $45.  Celesta Gentile is costing $35. Today I checked for samples but we only have one bottle remaining of samples for the Jardiance and I have given him that.  We had no samples of the Januvia. I had discussion with him explaining that I do not have any cheaper options that we can use at this point. Discussed that our options are limited in the future if his A1c is elevated only things to consider adding would be once weekly Trulicity or another med from that class or adding Lantus. He is to check fasting blood sugars and call me with these after checking for several days. Also, I told him that in general even down the road that if he thinks he is having uncontrolled blood sugars or thinks he is having any side effects from meds etc. to call and inform me rather than adjusting medicines  on his own.  He voices understanding and agrees.  This point will plan for follow-up visit in 3 months but to follow-up sooner if needed and call with blood sugar readings after med changes.     THE FOLLOWING ARE COPIED FROM PRIOR OV NOTES---NOT FROM OV 12/07/2017:   CKD (chronic kidney disease) stage 3, GFR 30-59 ml/min ([place]) At [place] 09/02/2017: Discussed with him that at his last lab on 05/27/17 his kidney numbers were up a little bit.  Told him that I will recheck those labs today.  Told him that if those numbers remain elevated or worsen then we will have to adjust some medications (metformin and Januvia) He voices understanding. He is on ACE inhibitor which should be providing some renal protection. - BASIC METABOLIC PANEL WITH GFR - Microalbumin, urine   Essential hypertension 12/03/2017: Blood pressure is at goal/controlled. He is on ACE inhibitor.Check labs to monitor.    Type 2 diabetes mellitus without complication, without long-term current use of insulin (HCC)  not able to afford the Invokana.IS NOT TAKING INVOKANA. In past we did patient assistance forms with the pharmaceutical company and they were not able to provide him with assistance. also there are notes in Epic from [facility] regarding this  Januvia is extremely expensive--- I give him samples when possible 11/26/2016--gave 6 weeks of samples of Januvia 02/23/2017--gave 4 weeks of samples of Januvia 05/27/2017--gave 4 weeks of samples of Januvia 09/02/2017----gave 4 weeks of samples of Januvia H/O LE Edema with Actos  08/2017---Watch Kidney Function regarding Metformin and Januvia- At [place] 12/03/2017: Have told him to stop the metformin and decrease the Januvia to 50 mg.  Will continue the Glucotrol XL 10 mg daily.   To try to keep his blood sugar controlled with having to stop the metformin and decrease the Januvia,  I am adding Jardiance 25 mg every morning. I will have him come in for follow-up visit on Monday to make sure he  has been able  to make these medicine changes and to make sure that his blood sugar is controlled.  He does have annual eye exam.  On ASA 81mg  QD On statin. On ACE inhibitor.  Has had Pneumovax 23 in past. Had Prevnar 13-- 12/2013.   Diabetic mononeuropathy associated with diabetes mellitus due to underlying condition ([place]) 12/03/2017---Stable/controlled with current dose of Neurontin  Hypercholesterolemia 12/03/2017---He is on Statin and Niaspan. Last FLP at goal. LFTs normal. Cont current meds.  Bilateral lower extremity edema 12/03/2017---He uses HCTZ 12.5mg  PRN swelling--no longer having to use daily.    screening PSA: Normal 07/2011. 05/27/2017---Will not repeat given advanced age   screening colonoscopy: At recent office visits we have discussed that this was due.  At visit 06/2014 he says that he needs to get his back pain control prior to scheduling this. Given age 89, can probably stop these--will discuss with pt Discussed colonoscpoy with patient at visit 04/12/15. He does seem interested in wanting to have continued follow-up colonoscopies. However at today's visit he just says it to much is going on and for [place] to rediscuss it at his next visit once things settle down. Discussed visit 05/21/16 and he is agreeable for me to place order for referral to GI for follow-up colonoscopy.---Ordered referrral to GI -- 05/21/2016 08/21/2016---Has colonoscopy scheduled this upcoming week.  11/26/2016--He reports that Dr. Kalman Shan gave him hemeOccults cards to turn in --did not have to do colonoscopy  Immunizations: Pneumonia vaccine: Received Pneumovax here 05/01/2008. Updated with Prevnar 13 12/2013.Received 06/23/2016 Tetanus: Given here 07/23/2011 Zostavax: He checked with his insurance-- cost would be $200 patient defers. Shingrix---05/27/2017----currently on back-order---will discuss in future--when available Influenza Vaccine: Given here 08/06/2015      Signed, Karis Juba, [place], [facility] 12/07/2017 12:03 PM

## 2017-12-30 ENCOUNTER — Other Ambulatory Visit: Payer: Self-pay | Admitting: Physician Assistant

## 2017-12-30 NOTE — Telephone Encounter (Signed)
Refill on hydrocodone to [facility].  °

## 2017-12-31 MED ORDER — HYDROCODONE-ACETAMINOPHEN 7.5-325 MG PO TABS: 1.0000 | ORAL_TABLET | Freq: Four times a day (QID) | ORAL | 0 refills | Status: DC | PRN

## 2017-12-31 NOTE — Telephone Encounter (Signed)
Last OV 12/07/2017 Last refill  11/04/2017 Ok to refill?

## 2018-01-04 ENCOUNTER — Other Ambulatory Visit: Payer: Self-pay | Admitting: Physician Assistant

## 2018-02-12 ENCOUNTER — Other Ambulatory Visit: Payer: Self-pay | Admitting: Physician Assistant

## 2018-02-12 NOTE — Telephone Encounter (Signed)
Pt needs refill on hydrocodone to [facility]

## 2018-02-15 MED ORDER — HYDROCODONE-ACETAMINOPHEN 7.5-325 MG PO TABS: 1.0000 | ORAL_TABLET | Freq: Four times a day (QID) | ORAL | 0 refills | Status: DC | PRN

## 2018-02-15 NOTE — Telephone Encounter (Signed)
Last OV 12/07/2017 Last refill 12/31/2017 Ok to refill?

## 2018-02-17 ENCOUNTER — Encounter (HOSPITAL_COMMUNITY): Payer: Self-pay | Admitting: *Deleted

## 2018-02-17 ENCOUNTER — Emergency Department (HOSPITAL_COMMUNITY): Payer: PPO

## 2018-02-17 ENCOUNTER — Emergency Department (HOSPITAL_COMMUNITY)
Admission: EM | Admit: 2018-02-17 | Discharge: 2018-02-18 | Disposition: A | Discharge status: Home / Self Care | Payer: PPO | Attending: Emergency Medicine | Admitting: Emergency Medicine

## 2018-02-17 ENCOUNTER — Other Ambulatory Visit: Payer: Self-pay

## 2018-02-17 DIAGNOSIS — R55 Syncope and collapse: Secondary | ICD-10-CM | POA: Insufficient documentation

## 2018-02-17 DIAGNOSIS — Z5321 Procedure and treatment not carried out due to patient leaving prior to being seen by health care provider: Secondary | ICD-10-CM | POA: Diagnosis not present

## 2018-02-17 LAB — BASIC METABOLIC PANEL
Anion gap: 13 (ref 5–15)
BUN: 57 mg/dL — ABNORMAL HIGH (ref 6–20)
CALCIUM: 9.6 mg/dL (ref 8.9–10.3)
CO2: 20 mmol/L — ABNORMAL LOW (ref 22–32)
Chloride: 105 mmol/L (ref 101–111)
Creatinine, Ser: 2.5 mg/dL — ABNORMAL HIGH (ref 0.61–1.24)
GFR, EST AFRICAN AMERICAN: 26 mL/min — AB (ref >60)
GFR, EST NON AFRICAN AMERICAN: 23 mL/min — AB (ref >60)
Glucose, Bld: 216 mg/dL — ABNORMAL HIGH (ref 65–99)
Potassium: 4.7 mmol/L (ref 3.5–5.1)
SODIUM: 138 mmol/L (ref 135–145)

## 2018-02-17 LAB — TROPONIN I

## 2018-02-17 LAB — CBC
HCT: 34.8 % — ABNORMAL LOW (ref 39.0–52.0)
Hemoglobin: 11.4 g/dL — ABNORMAL LOW (ref 13.0–17.0)
MCH: 28.4 pg (ref 26.0–34.0)
MCHC: 32.8 g/dL (ref 30.0–36.0)
MCV: 86.6 fL (ref 78.0–100.0)
PLATELETS: 216 10*3/uL (ref 150–400)
RBC: 4.02 MIL/uL — AB (ref 4.22–5.81)
RDW: 15.1 % (ref 11.5–15.5)
WBC: 17.6 10*3/uL — AB (ref 4.0–10.5)

## 2018-02-17 NOTE — ED Triage Notes (Signed)
The pt fell this am when he went to the br.  He reports that his legs gave way on him  He pulled himself up to a chair  He lives alone.  He did not call his daughter until 15.  He is alert no distress he is c/o pain in both legs  And painful elbows.  His daughter reports that he  Has had a tremor or  Jerking motion in his lt arm and lt leg for 2 or 3 months

## 2018-02-18 ENCOUNTER — Encounter: Payer: Self-pay | Admitting: Physician Assistant

## 2018-02-18 ENCOUNTER — Other Ambulatory Visit: Payer: Self-pay

## 2018-02-18 ENCOUNTER — Ambulatory Visit (INDEPENDENT_AMBULATORY_CARE_PROVIDER_SITE_OTHER): Payer: PPO | Admitting: Physician Assistant

## 2018-02-18 VITALS — BP 116/60 | HR 91 | Temp 98.1°F | Ht 68.0 in

## 2018-02-18 DIAGNOSIS — E860 Dehydration: Secondary | ICD-10-CM | POA: Diagnosis not present

## 2018-02-18 DIAGNOSIS — N183 Chronic kidney disease, stage 3 unspecified: Secondary | ICD-10-CM

## 2018-02-18 DIAGNOSIS — R059 Cough, unspecified: Secondary | ICD-10-CM

## 2018-02-18 DIAGNOSIS — R05 Cough: Secondary | ICD-10-CM | POA: Diagnosis not present

## 2018-02-18 DIAGNOSIS — W19XXXA Unspecified fall, initial encounter: Secondary | ICD-10-CM | POA: Diagnosis not present

## 2018-02-18 DIAGNOSIS — J988 Other specified respiratory disorders: Secondary | ICD-10-CM

## 2018-02-18 DIAGNOSIS — B9689 Other specified bacterial agents as the cause of diseases classified elsewhere: Secondary | ICD-10-CM

## 2018-02-18 DIAGNOSIS — E1140 Type 2 diabetes mellitus with diabetic neuropathy, unspecified: Secondary | ICD-10-CM | POA: Diagnosis not present

## 2018-02-18 DIAGNOSIS — I10 Essential (primary) hypertension: Secondary | ICD-10-CM | POA: Diagnosis not present

## 2018-02-18 DIAGNOSIS — D72829 Elevated white blood cell count, unspecified: Secondary | ICD-10-CM | POA: Diagnosis not present

## 2018-02-18 LAB — URINALYSIS, ROUTINE W REFLEX MICROSCOPIC
Bilirubin Urine: NEGATIVE
Hgb urine dipstick: NEGATIVE
KETONES UR: NEGATIVE
Leukocytes, UA: NEGATIVE
NITRITE: NEGATIVE
PROTEIN: NEGATIVE
SPECIFIC GRAVITY, URINE: 1.015 (ref 1.001–1.03)
pH: 5 (ref 5.0–8.0)

## 2018-02-18 MED ORDER — LEVOFLOXACIN 500 MG PO TABS: 500.0000 mg | ORAL_TABLET | Freq: Every day | ORAL | 0 refills | Status: AC

## 2018-02-18 MED ORDER — EMPAGLIFLOZIN 25 MG PO TABS: 25.0000 mg | ORAL_TABLET | Freq: Every day | ORAL | 3 refills | Status: DC

## 2018-02-18 NOTE — Progress Notes (Signed)
Patient ID: DOY TAAFFE MRN: [id number], DOB: 06/02/1935, 83 y.o. Date of Encounter: @DATE @  Chief Complaint:  Chief Complaint  Patient presents with  . passed out twice    5/15  . Cough    HPI: 83 y.o. year old male  presents with above.   His daughter accompanies him for visit today. Had reviewed that his last routine visit with me was at the beginning of March and that at that visit things were fairly stable.  We had just recently adjusted some of his diabetic medications secondary to CKD.  Otherwise things were stable and from a symptomatic standpoint patient had been stable and had not been reporting any concerns or symptoms.  However, today this daughter who accompanies him today states that she has been noticing a decline with him for months.  Has noticed him having more problems with his short-term memory.  Says that she knows that he comes in here for his visits and does not tell me that he is having any issues.  Says that he tries to appear as if things are fine and that he is handling everything fine but she does not think he is.  She states that she lives about 15 to 20 minutes from him.  Says that on average she usually sees him 1-2 times per week but she had not seen him for 2 weeks up until yesterday-- prior to yesterday had not seen him for 2 weeks.  However says that she does talk to him on the phone more often.  States that he also has 1 son that lives in [place]. States that he has another son that lives in [place].  Has these 3 children.  She reports that all 3 of them work full-time. She works 4:30 am - noon.  We discussed that she would be available in the afternoons to come with him to follow-up office visits with me.  She reports that they have tried to bring up the issue that he seems to need some assistance but that he seems to "not want to hear it ".  She says that she "does not know whether he is just too proud or just too independent."  They  report that he fell yesterday morning and was on the floor for an extended amount of time.  Patient states that he did not have a phone and that he had fallen and was on the floor and he tried to crawl but could not because his leg hurt.  Says that he finally got himself to a recliner/chair that he was able to hold to and try to walk but he held to the chair and collapsed again.  Somehow he did eventually get to a phone and called his daughter.  Says that all she heard on the phone was him saying "help me, help me".  She took him to the ER and I do have chest x-ray report and lab report that was done there but then he left the ER before evaluation was complete.  Today I reviewed with patient and daughter that significant findings from the labs and x-ray from the ER show: Troponin negative. Chest x-ray showed no acute abnormality Lab was significant for elevated white count Lab also significant for significant elevation of BUN and mild elevation of creatinine consistent with dehydration.  Discussed with patient and daughter that ideally he does need to be completely evaluated and completely treated at the emergency room/hospital.  However he refuses to go back  to the hospital. Discussed with patient and daughter that my plan would be to give him IV fluids here and to evaluate because of infection/elevated white count.  They both report that he has had significant congested cough for about 2 weeks.  Daughter states that even with talking to him on the telephone that she has been able to tell that he has had a "bad cough "for about 2 weeks.  Denies any other signs or symptoms of infection.  Has had no dysuria.  No pain in his suprapubic/pelvic region.  No abdominal pain.  No vomiting or diarrhea.  He has not been having any fevers or chills.     Past Medical History:  Diagnosis Date  . Arthritis   . Diabetes mellitus    Type 2  . Diabetic neuropathy ([place])   . History of kidney stones   . HTN  (hypertension) 03/03/2013  . Hypercholesterolemia   . 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 Skin cancer of arm 04/20/2013   left upper arm  . Spinal stenosis 05/17/2014   L3-L4   . Urgency of urination      Home Meds: Outpatient Medications Prior to Visit  Medication Sig Dispense Refill  . acetaminophen (TYLENOL) 500 MG tablet Take 1,000 mg by mouth every 8 (eight) hours as needed for mild pain or moderate pain (back pain).    Marland Kitchen aspirin EC 81 MG tablet Take 81 mg by mouth daily.      . cholecalciferol (VITAMIN D) 1000 UNITS tablet Take 1,000 Units by mouth daily.      . Cyanocobalamin (B-12 PO) Take 1 tablet by mouth daily.    . empagliflozin (JARDIANCE) 25 MG TABS tablet Take 25 mg by mouth daily. 30 tablet 3  . fish oil-omega-3 fatty acids 1000 MG capsule Take 3 g by mouth 2 (two) times daily.      Marland Kitchen gabapentin (NEURONTIN) 600 MG tablet TAKE 1 TABLET BY MOUTH THREE TIMES DAILY 90 tablet 3  . glipiZIDE (GLUCOTROL) 10 MG tablet TAKE ONE TABLET BY MOUTH TWICE DAILY BEFORE  A  MEAL 180 tablet 3  . HYDROcodone-acetaminophen (NORCO) 7.5-325 MG tablet Take 1 tablet by mouth every 6 (six) hours as needed for moderate pain. 30 tablet 0  . lisinopril (PRINIVIL,ZESTRIL) 10 MG tablet Take 1 tablet (10 mg total) by mouth daily. 90 tablet 0  . Multiple Vitamin (MULITIVITAMIN WITH MINERALS) TABS Take 1 tablet by mouth daily.      . niacin (NIASPAN) 500 MG CR tablet TAKE 1 TABLET BY MOUTH AT BEDTIME 90 tablet 0  . ONE TOUCH ULTRA TEST test strip USE TO CHECK FASTING BLOOD SUGARS THREE TIMES DAILY 100 each 11  . potassium gluconate 595 MG TABS tablet Take 595 mg by mouth daily.    . simvastatin (ZOCOR) 40 MG tablet TAKE 1 TABLET BY MOUTH DAILY WITH  BREAKFAST 90 tablet 0  . sitaGLIPtin (JANUVIA) 50 MG tablet Take 1 tablet (50 mg total) by mouth daily. 90 tablet 1   No facility-administered medications prior to visit.     Allergies:  Allergies  Allergen Reactions  . Actos [Pioglitazone]     LE  Edema--03/2014    Social History   Socioeconomic History  . Marital status: Divorced    Spouse name: Not on file  . Number of children: Not on file  . Years of education: Not on file  . Highest education level: Not on file  Occupational History  . Not on file  Social Needs  .  Financial resource strain: Not on file  . Food insecurity:    Worry: Not on file    Inability: Not on file  . Transportation needs:    Medical: Not on file    Non-medical: Not on file  Tobacco Use  . Smoking status: Former Smoker    Last attempt to quit: 03/04/2003    Years since quitting: 14.9  . Smokeless tobacco: Never Used  Substance and Sexual Activity  . Alcohol use: No  . Drug use: No  . Sexual activity: Not on file  Lifestyle  . Physical activity:    Days per week: Not on file    Minutes per session: Not on file  . Stress: Not on file  Relationships  . Social connections:    Talks on phone: Not on file    Gets together: Not on file    Attends religious service: Not on file    Active member of club or organization: Not on file    Attends meetings of clubs or organizations: Not on file    Relationship status: Not on file  . Intimate partner violence:    Fear of current or ex partner: Not on file    Emotionally abused: Not on file    Physically abused: Not on file    Forced sexual activity: Not on file  Other Topics Concern  . Not on file  Social History Narrative  . Not on file    No family history on file.   Review of Systems:  See HPI for pertinent ROS. All other ROS negative.    Physical Exam: Blood pressure 116/60, pulse 91, temperature 98.1 F (36.7 C), temperature source Oral, height 5\' 8"  (1.727 m), SpO2 96 %., Body mass index is 31.63 kg/m. General: WM. Appears in no acute distress. Neck: Supple. No thyromegaly. No lymphadenopathy. Lungs: Clear bilaterally to auscultation without wheezes, rales, or rhonchi. Breathing is unlabored. Heart: RRR with S1 S2. No murmurs,  rubs, or gallops. Abdomen: Soft, non-tender, non-distended with normoactive bowel sounds. No hepatomegaly. No rebound/guarding. No obvious abdominal masses. Musculoskeletal:  Strength and tone normal for age. Extremities/Skin: Warm and dry.  No edema.  Neuro: Alert and oriented X 3. Moves all extremities spontaneously. Gait is normal. CNII-XII grossly in tact. Psych:  Responds to questions appropriately with a normal affect.     ASSESSMENT AND PLAN:  83 y.o. year old male with   1. Fall, initial encounter Discussed with patient and daughter that ideally he does need to be completely evaluated and completely treated at the emergency room/hospital.  However he refuses to go back to the hospital. Discussed with patient and daughter that my plan would be to give him IV fluids here and to evaluate cause of infection/elevated white count.  They both report that he has had significant congested cough for about 2 weeks.  Daughter states that even with talking to him on the telephone that she has been able to tell that he has had a "bad cough "for about 2 weeks.  Denies any other signs or symptoms of infection.  Has had no dysuria.  No pain in his suprapubic/pelvic region.  No abdominal pain.  No vomiting or diarrhea.  He has not been having any fevers or chills.  Discussed whether his leg still has pain whether he is having any area of pain that needs x-ray to evaluate for fracture.  He reports that there is no area of significant pain at this point and refuses need for  x-ray.  UA performed today shows no UTI.  He is treated here today with 2 bas of IV Fluids.  He is given Rx for Levaquin 500mg  QD x 7 days.  He has scheduled f/u OV with me on Monday afternoon--this daughter is available to come with him to that OV.  He is to go to ER if symptoms worsen in interim. Pt and daughter voice understanding, agree.   - Urinalysis, Routine w reflex microscopic - Urine Culture  2. Dehydration He is  treated here today with 2 bas of IV Fluids.  He is to force fluids once he is at home.  He has scheduled f/u OV with me on Monday afternoon--this daughter is available to come with him to that OV.  He is to go to ER if symptoms worsen in interim. Pt and daughter voice understanding, agree.   - Urinalysis, Routine w reflex microscopic - Urine Culture  3. Leukocytosis, unspecified type UA shows no UTI He has cough He is to take Levaquin 500mg  QD x 5 days If symptoms worsen, go to ER   4. Cough He has cough He is to take Levaquin 500mg  QD x 5 days If symptoms worsen, go to ER    5. Bacterial respiratory infection He has cough He is to take Levaquin 500mg  QD x 5 days If symptoms worsen, go to ER    6. Essential hypertension Stable, cont current meds  7. Type 2 diabetes mellitus with diabetic neuropathy, without long-term current use of insulin (HCC) Stable, cont current meds   8. CKD (chronic kidney disease) stage 3, GFR 30-59 ml/min (HCC) He is given IV Fluids for dehydration. F/U OV Monday. If worsens in interim, go to ER.   Marin Olp [place], [place], [facility] 02/18/2018 3:27 PM

## 2018-02-18 NOTE — ED Notes (Signed)
Pt stated they are leaving 

## 2018-02-20 LAB — URINE CULTURE
MICRO NUMBER: 90598160
SPECIMEN QUALITY:: ADEQUATE

## 2018-02-22 ENCOUNTER — Ambulatory Visit (INDEPENDENT_AMBULATORY_CARE_PROVIDER_SITE_OTHER): Payer: PPO | Admitting: Physician Assistant

## 2018-02-22 ENCOUNTER — Other Ambulatory Visit: Payer: Self-pay

## 2018-02-22 ENCOUNTER — Encounter: Payer: Self-pay | Admitting: Physician Assistant

## 2018-02-22 VITALS — BP 128/60 | HR 75 | Temp 97.6°F | Ht 68.0 in | Wt 202.4 lb

## 2018-02-22 DIAGNOSIS — J301 Allergic rhinitis due to pollen: Secondary | ICD-10-CM

## 2018-02-22 DIAGNOSIS — R531 Weakness: Secondary | ICD-10-CM | POA: Diagnosis not present

## 2018-02-22 DIAGNOSIS — E860 Dehydration: Secondary | ICD-10-CM | POA: Diagnosis not present

## 2018-02-22 DIAGNOSIS — W19XXXD Unspecified fall, subsequent encounter: Secondary | ICD-10-CM | POA: Diagnosis not present

## 2018-02-22 DIAGNOSIS — D72829 Elevated white blood cell count, unspecified: Secondary | ICD-10-CM

## 2018-02-22 LAB — CBC WITH DIFFERENTIAL/PLATELET
BASOS ABS: 85 {cells}/uL (ref 0–200)
Basophils Relative: 0.6 %
EOS ABS: 440 {cells}/uL (ref 15–500)
Eosinophils Relative: 3.1 %
HEMATOCRIT: 30.4 % — AB (ref 38.5–50.0)
HEMOGLOBIN: 10.3 g/dL — AB (ref 13.2–17.1)
LYMPHS ABS: 1889 {cells}/uL (ref 850–3900)
MCH: 28.5 pg (ref 27.0–33.0)
MCHC: 33.9 g/dL (ref 32.0–36.0)
MCV: 84 fL (ref 80.0–100.0)
MPV: 9.5 fL (ref 7.5–12.5)
Monocytes Relative: 9.1 %
NEUTROS ABS: 10494 {cells}/uL — AB (ref 1500–7800)
Neutrophils Relative %: 73.9 %
Platelets: 229 10*3/uL (ref 140–400)
RBC: 3.62 10*6/uL — ABNORMAL LOW (ref 4.20–5.80)
RDW: 14.4 % (ref 11.0–15.0)
Total Lymphocyte: 13.3 %
WBC mixed population: 1292 cells/uL — ABNORMAL HIGH (ref 200–950)
WBC: 14.2 10*3/uL — ABNORMAL HIGH (ref 3.8–10.8)

## 2018-02-22 LAB — BASIC METABOLIC PANEL WITH GFR
BUN/Creatinine Ratio: 25 (calc) — ABNORMAL HIGH (ref 6–22)
BUN: 59 mg/dL — ABNORMAL HIGH (ref 7–25)
CHLORIDE: 106 mmol/L (ref 98–110)
CO2: 22 mmol/L (ref 20–32)
CREATININE: 2.32 mg/dL — AB (ref 0.70–1.11)
Calcium: 9.6 mg/dL (ref 8.6–10.3)
GFR, Est African American: 29 mL/min/{1.73_m2} — ABNORMAL LOW (ref >=60)
GFR, Est Non African American: 25 mL/min/{1.73_m2} — ABNORMAL LOW (ref >=60)
Glucose, Bld: 156 mg/dL — ABNORMAL HIGH (ref 65–99)
POTASSIUM: 5 mmol/L (ref 3.5–5.3)
SODIUM: 139 mmol/L (ref 135–146)

## 2018-02-22 MED ORDER — CETIRIZINE HCL 10 MG PO CAPS: ORAL_CAPSULE | ORAL | 5 refills | Status: DC

## 2018-02-22 NOTE — Progress Notes (Signed)
Patient ID: Christian [name] MRN: [id number], DOB: [birth year], 83 y.o. Date of Encounter: @DATE @  Chief Complaint:  Chief Complaint  Patient presents with  . Fall    Patient in today for a follow up. states feels better.    HPI: 83 y.o. year old male  presents with above.    02/18/2018: His daughter accompanies him for visit today. Had reviewed that his last routine visit with me was at the beginning of March and that at that visit things were fairly stable.  We had just recently adjusted some of his diabetic medications secondary to CKD.  Otherwise things were stable and from a symptomatic standpoint patient had been stable and had not been reporting any concerns or symptoms.  However, today this daughter who accompanies him today states that she has been noticing a decline with him for months.  Has noticed him having more problems with his short-term memory.  Says that she knows that he comes in here for his visits and does not tell me that he is having any issues.  Says that he tries to appear as if things are fine and that he is handling everything fine but she does not think he is.  She states that she lives about 15 to 20 minutes from him.  Says that on average she usually sees him 1-2 times per week but she had not seen him for 2 weeks up until yesterday-- prior to yesterday had not seen him for 2 weeks.  However says that she does talk to him on the phone more often.  States that he also has 1 son that lives in [place]. States that he has another son that lives in [place].  Has these 3 children.  She reports that all 3 of them work full-time. She works 4:30 am - noon.  We discussed that she would be available in the afternoons to come with him to follow-up office visits with me.  She reports that they have tried to bring up the issue that he seems to need some assistance but that he seems to "not want to hear it ".  She says that she "does not know whether he is just too  proud or just too independent."  They report that he fell yesterday morning and was on the floor for an extended amount of time.  Patient states that he did not have a phone and that he had fallen and was on the floor and he tried to crawl but could not because his leg hurt.  Says that he finally got himself to a recliner/chair that he was able to hold to and try to walk but he held to the chair and collapsed again.  Somehow he did eventually get to a phone and called his daughter.  Says that all she heard on the phone was him saying "help me, help me".  She took him to the ER and I do have chest x-ray report and lab report that was done there but then he left the ER before evaluation was complete.  Today I reviewed with patient and daughter that significant findings from the labs and x-ray from the ER show: Troponin negative. Chest x-ray showed no acute abnormality Lab was significant for elevated white count Lab also significant for significant elevation of BUN and mild elevation of creatinine consistent with dehydration.  Discussed with patient and daughter that ideally he does need to be completely evaluated and completely treated at the emergency room/hospital.  However he refuses to go back to the hospital. Discussed with patient and daughter that my plan would be to give him IV fluids here and to evaluate because of infection/elevated white count.  They both report that he has had significant congested cough for about 2 weeks.  Daughter states that even with talking to him on the telephone that she has been able to tell that he has had a "bad cough "for about 2 weeks.  Denies any other signs or symptoms of infection.  Has had no dysuria.  No pain in his suprapubic/pelvic region.  No abdominal pain.  No vomiting or diarrhea.  He has not been having any fevers or chills.    A/P AT THAT OV----  Discussed with patient and daughter that ideally he does need to be completely evaluated and  completely treated at the emergency room/hospital.  However he refuses to go back to the hospital. Discussed with patient and daughter that my plan would be to give him IV fluids here and to evaluate cause of infection/elevated white count.  They both report that he has had significant congested cough for about 2 weeks.  Daughter states that even with talking to him on the telephone that she has been able to tell that he has had a "bad cough "for about 2 weeks.  Denies any other signs or symptoms of infection.  Has had no dysuria.  No pain in his suprapubic/pelvic region.  No abdominal pain.  No vomiting or diarrhea.  He has not been having any fevers or chills.  Discussed whether his leg still has pain whether he is having any area of pain that needs x-ray to evaluate for fracture.  He reports that there is no area of significant pain at this point and refuses need for x-ray.  UA performed today shows no UTI.  He is treated here today with 2 bags of IV Fluids.  He is given Rx for Levaquin 500mg  QD x 7 days.  He has scheduled f/u OV with me on Monday afternoon--this daughter is available to come with him to that OV.  He is to go to ER if symptoms worsen in interim. Pt and daughter voice understanding, agree.    02/22/2018: Today pt looks much better. Says he feels a lot better too.  Reports he has been taking Levaquin as directed. Says cough is better already.  Says he has been up, walking around house some.  I discussed with pt and daughter--- whether they feel he is safe at home, can care for himself. Discussed cost of care at home, assisted living, nursing home etc. They are to discuss as a family regarding finances etc. I discussed that insurance should cover Physical Therapy and that could improve his strength and balance etc. He is agreeable to proceed with PT.  Daughter requests medication for him to use for allergy symptoms that is safe to use with his other meds--says he has had some  sneezing, rhinorrhea.     Past Medical History:  Diagnosis Date  . Arthritis   . Diabetes mellitus    Type 2  . Diabetic neuropathy ([place])   . History of kidney stones   . HTN (hypertension) 03/03/2013  . Hypercholesterolemia   . 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 Skin cancer of arm 04/20/2013   left upper arm  . Spinal stenosis 05/17/2014   L3-L4   . Urgency of urination      Home Meds: Outpatient Medications Prior to Visit  Medication  Sig Dispense Refill  . acetaminophen (TYLENOL) 500 MG tablet Take 1,000 mg by mouth every 8 (eight) hours as needed for mild pain or moderate pain (back pain).    Marland Kitchen aspirin EC 81 MG tablet Take 81 mg by mouth daily.      . cholecalciferol (VITAMIN D) 1000 UNITS tablet Take 1,000 Units by mouth daily.      . Cyanocobalamin (B-12 PO) Take 1 tablet by mouth daily.    . empagliflozin (JARDIANCE) 25 MG TABS tablet Take 25 mg by mouth daily. 30 tablet 3  . fish oil-omega-3 fatty acids 1000 MG capsule Take 3 g by mouth 2 (two) times daily.      Marland Kitchen gabapentin (NEURONTIN) 600 MG tablet TAKE 1 TABLET BY MOUTH THREE TIMES DAILY 90 tablet 3  . glipiZIDE (GLUCOTROL) 10 MG tablet TAKE ONE TABLET BY MOUTH TWICE DAILY BEFORE  A  MEAL 180 tablet 3  . HYDROcodone-acetaminophen (NORCO) 7.5-325 MG tablet Take 1 tablet by mouth every 6 (six) hours as needed for moderate pain. 30 tablet 0  . levofloxacin (LEVAQUIN) 500 MG tablet Take 1 tablet (500 mg total) by mouth daily for 7 days. 7 tablet 0  . lisinopril (PRINIVIL,ZESTRIL) 10 MG tablet Take 1 tablet (10 mg total) by mouth daily. 90 tablet 0  . Multiple Vitamin (MULITIVITAMIN WITH MINERALS) TABS Take 1 tablet by mouth daily.      . niacin (NIASPAN) 500 MG CR tablet TAKE 1 TABLET BY MOUTH AT BEDTIME 90 tablet 0  . ONE TOUCH ULTRA TEST test strip USE TO CHECK FASTING BLOOD SUGARS THREE TIMES DAILY 100 each 11  . potassium gluconate 595 MG TABS tablet Take 595 mg by mouth daily.    . simvastatin (ZOCOR) 40 MG tablet  TAKE 1 TABLET BY MOUTH DAILY WITH  BREAKFAST 90 tablet 0  . sitaGLIPtin (JANUVIA) 50 MG tablet Take 1 tablet (50 mg total) by mouth daily. 90 tablet 1   No facility-administered medications prior to visit.     Allergies:  Allergies  Allergen Reactions  . Actos [Pioglitazone]     LE Edema--03/2014    Social History   Socioeconomic History  . Marital status: Divorced    Spouse name: Not on file  . Number of children: Not on file  . Years of education: Not on file  . Highest education level: Not on file  Occupational History  . Not on file  Social Needs  . Financial resource strain: Not on file  . Food insecurity:    Worry: Not on file    Inability: Not on file  . Transportation needs:    Medical: Not on file    Non-medical: Not on file  Tobacco Use  . Smoking status: Former Smoker    Last attempt to quit: 03/04/2003    Years since quitting: 14.9  . Smokeless tobacco: Never Used  Substance and Sexual Activity  . Alcohol use: No  . Drug use: No  . Sexual activity: Not on file  Lifestyle  . Physical activity:    Days per week: Not on file    Minutes per session: Not on file  . Stress: Not on file  Relationships  . Social connections:    Talks on phone: Not on file    Gets together: Not on file    Attends religious service: Not on file    Active member of club or organization: Not on file    Attends meetings of clubs or organizations: Not on  file    Relationship status: Not on file  . Intimate partner violence:    Fear of current or ex partner: Not on file    Emotionally abused: Not on file    Physically abused: Not on file    Forced sexual activity: Not on file  Other Topics Concern  . Not on file  Social History Narrative  . Not on file    History reviewed. No pertinent family history.   Review of Systems:  See HPI for pertinent ROS. All other ROS negative.    Physical Exam: Blood pressure 128/60, pulse 75, temperature 97.6 F (36.4 C), temperature  source Oral, height 5\' 8"  (1.727 m), weight 91.8 kg (202 lb 6 oz), SpO2 95 %., Body mass index is 30.77 kg/m. General: WNWD WM. Appears in no acute distress. Neck: Supple. No thyromegaly. No lymphadenopathy. Lungs: Clear bilaterally to auscultation without wheezes, rales, or rhonchi. Breathing is unlabored. Heart: RRR with S1 S2. No murmurs, rubs, or gallops. Abdomen: Soft, non-tender, non-distended with normoactive bowel sounds. No hepatomegaly. No rebound/guarding. No obvious abdominal masses. Musculoskeletal:  Strength and tone normal for age. Extremities/Skin: Warm and dry. No edema. Neuro: Alert and oriented X 3. Moves all extremities spontaneously. Gait is normal. CNII-XII grossly in tact. Psych:  Responds to questions appropriately with a normal affect.     ASSESSMENT AND PLAN:  83 y.o. year old male with   1. Seasonal allergic rhinitis due to pollen - Cetirizine HCl (ZYRTEC ALLERGY) 10 MG CAPS; Take once daily as needed for allergy symptoms. (May cause drowsiness)  Dispense: 30 capsule; Refill: 5  2. Dehydration Recheck BMET to f/u after IVF. Discussed need to cont to force fluids and eat protein on routine basis. - BASIC METABOLIC PANEL WITH GFR - Ambulatory referral to Physical Therapy  3. Leukocytosis, unspecified type Will recheck CBC to monitor leukocytosis on Levaquin for cough. - CBC with Differential/Platelet - Ambulatory referral to Physical Therapy  4. Weakness Will recheck labs to monitor. Refer to PT for strengthening.  Daughter plans to get Life Alert for pt to have available to call if needs help. - CBC with Differential/Platelet - BASIC METABOLIC PANEL WITH GFR - Ambulatory referral to Physical Therapy  5. Fall, subsequent encounter Will recheck labs to monitor. Refer to PT for strengthening.  Daughter plans to get Life Alert for pt to have available to call if needs help.  - CBC with Differential/Platelet - BASIC METABOLIC PANEL WITH GFR - Ambulatory  referral to Physical Therapy  He is due for 3 month ROV 03/09/2018. He already has that 3 month ROV scheduled for early June. F/U then, f/u sooner if needed.  [address] [place], [place], [facility] 02/22/2018 7:55 PM

## 2018-02-26 ENCOUNTER — Other Ambulatory Visit: Payer: Self-pay

## 2018-02-26 DIAGNOSIS — N183 Chronic kidney disease, stage 3 unspecified: Secondary | ICD-10-CM

## 2018-03-09 DIAGNOSIS — I129 Hypertensive chronic kidney disease with stage 1 through stage 4 chronic kidney disease, or unspecified chronic kidney disease: Secondary | ICD-10-CM | POA: Diagnosis not present

## 2018-03-09 DIAGNOSIS — N184 Chronic kidney disease, stage 4 (severe): Secondary | ICD-10-CM | POA: Diagnosis not present

## 2018-03-15 ENCOUNTER — Other Ambulatory Visit: Payer: Self-pay

## 2018-03-15 ENCOUNTER — Ambulatory Visit (INDEPENDENT_AMBULATORY_CARE_PROVIDER_SITE_OTHER): Payer: PPO | Admitting: Physician Assistant

## 2018-03-15 ENCOUNTER — Encounter: Payer: Self-pay | Admitting: Physician Assistant

## 2018-03-15 VITALS — BP 130/60 | HR 68 | Temp 97.7°F | Resp 16 | Ht 68.0 in | Wt 202.2 lb

## 2018-03-15 DIAGNOSIS — N183 Chronic kidney disease, stage 3 unspecified: Secondary | ICD-10-CM

## 2018-03-15 DIAGNOSIS — I10 Essential (primary) hypertension: Secondary | ICD-10-CM | POA: Diagnosis not present

## 2018-03-15 DIAGNOSIS — E1140 Type 2 diabetes mellitus with diabetic neuropathy, unspecified: Secondary | ICD-10-CM | POA: Diagnosis not present

## 2018-03-15 DIAGNOSIS — E7800 Pure hypercholesterolemia, unspecified: Secondary | ICD-10-CM | POA: Diagnosis not present

## 2018-03-15 NOTE — Progress Notes (Addendum)
Patient ID: Christian [name] MRN: [id number], DOB: 04/26/1935, 83 y.o. Date of Encounter: @DATE @  Chief Complaint:  Chief Complaint  Patient presents with  . routine office visit  . discuss blood pressure  . discuss cholesterol    HPI: 83 y.o. year old male  presents for routine 3 months f/u OV to f/u DM, HTN, and other chronic medical problems.   Hypertension: 12/03/2017: Taking meds as directed. No adverse effects.  Hyperlipidemia: 12/03/2017: On Zocor 40mg  and on Niaspan. No myalgias or other adverse medication effects.  Diabetes: In the past we had difficulty getting his diabetes controlled and he was very noncompliant with diet. All of this information is detailed and documented in my note 05/21/16. See that note for details of all of the past information. 03/2014 he was having lower extremity edema so Actos was stopped and was changed to Blyn. Also --have added Invokana, but unable to afford- At the time of his OV 09/02/2017---He was on:  metformin, Glucotrol, Januvia. Labs done 09/02/17 showed elevated creatinine.  At that time I said to stop metformin and to decrease Januvia to 50 mg. Today--12/03/2017--- he states that when he stopped the metformin and decreased the Januvia, that his blood sugar went up really high and his vision got blurry.   He says that that scared him so he went back on the metformin and went back on the Januvia 100 mg.  At [place] 12/03/2017: Have told him to stop the metformin and decrease the Januvia to 50 mg.  Will continue the Glucotrol XL 10 mg daily.   To try to keep his blood sugar controlled with having to stop the metformin and decrease the Januvia,  I am adding Jardiance 25 mg every morning. I will have him come in for follow-up visit on Monday to make sure he has been able to make these medicine changes and to make sure that his blood sugar is controlled.   12/07/2017: Today he reports that he has been taking medications as directed at his  recent visit 12/03/17. He did stop the metformin and he did decrease the Januvia down to 50 mg. He did continue the Glucotrol 10 mg daily. He did add the Jardiance 25 mg daily. I asked if he had some blood sugar readings for me to review.  He says " [name], I forgot to even check any blood sugars--- I was so busy adjusting these medicines." Says that the Jardiance cost $45 for him.  Says the Januvia cost him $35. He states that he is feeling fine after making these medicine adjustments.  He is having no adverse effects.       ------------------------------------------------------------------------------------------------------------------------------------------------------------------------------------------------------------------------------------------------------------------------  03/15/2018:  Today I reviewed his chart.   --Reviewed that his last routine 54-month office visit with me was 12/07/2017 which is documented above. --I reviewed that since that visit he had 2 visits with me regarding a fall at home.  The following information is copied from those visits.    02/18/2018: His daughter accompanies him for visit today. Had reviewed that his last routine visit with me was at the beginning of March and that at that visit things were fairly stable.  We had just recently adjusted some of his diabetic medications secondary to CKD.  Otherwise things were stable and from a symptomatic standpoint patient had been stable and had not been reporting any concerns or symptoms.  However, today this daughter who accompanies him today states that she has been noticing a decline  with him for months.  Has noticed him having more problems with his short-term memory.  Says that she knows that he comes in here for his visits and does not tell me that he is having any issues.  Says that he tries to appear as if things are fine and that he is handling everything fine but she does not think he is.  She  states that she lives about 15 to 20 minutes from him.  Says that on average she usually sees him 1-2 times per week but she had not seen him for 2 weeks up until yesterday-- prior to yesterday had not seen him for 2 weeks.  However says that she does talk to him on the phone more often.  States that he also has 1 son that lives in [place]. States that he has another son that lives in [place].  Has these 3 children.  She reports that all 3 of them work full-time. She works 4:30 am - noon.  We discussed that she would be available in the afternoons to come with him to follow-up office visits with me.  She reports that they have tried to bring up the issue that he seems to need some assistance but that he seems to "not want to hear it ".  She says that she "does not know whether he is just too proud or just too independent."  They report that he fell yesterday morning and was on the floor for an extended amount of time.  Patient states that he did not have a phone and that he had fallen and was on the floor and he tried to crawl but could not because his leg hurt.  Says that he finally got himself to a recliner/chair that he was able to hold to and try to walk but he held to the chair and collapsed again.  Somehow he did eventually get to a phone and called his daughter.  Says that all she heard on the phone was him saying "help me, help me".  She took him to the ER and I do have chest x-ray report and lab report that was done there but then he left the ER before evaluation was complete.  Today I reviewed with patient and daughter that significant findings from the labs and x-ray from the ER show: Troponin negative. Chest x-ray showed no acute abnormality Lab was significant for elevated white count Lab also significant for significant elevation of BUN and mild elevation of creatinine consistent with dehydration.  Discussed with patient and daughter that ideally he does need to be  completely evaluated and completely treated at the emergency room/hospital.  However he refuses to go back to the hospital. Discussed with patient and daughter that my plan would be to give him IV fluids here and to evaluate because of infection/elevated white count.  They both report that he has had significant congested cough for about 2 weeks.  Daughter states that even with talking to him on the telephone that she has been able to tell that he has had a "bad cough "for about 2 weeks.  Denies any other signs or symptoms of infection.  Has had no dysuria.  No pain in his suprapubic/pelvic region.  No abdominal pain.  No vomiting or diarrhea.  He has not been having any fevers or chills.    A/P AT THAT OV----  Discussed with patient and daughter that ideally he does need to be completely evaluated and completely treated at  the emergency room/hospital.  However he refuses to go back to the hospital. Discussed with patient and daughter that my plan would be to give him IV fluids here and to evaluate cause of infection/elevated white count.  They both report that he has had significant congested cough for about 2 weeks.  Daughter states that even with talking to him on the telephone that she has been able to tell that he has had a "bad cough "for about 2 weeks.  Denies any other signs or symptoms of infection.  Has had no dysuria.  No pain in his suprapubic/pelvic region.  No abdominal pain.  No vomiting or diarrhea.  He has not been having any fevers or chills.  Discussed whether his leg still has pain whether he is having any area of pain that needs x-ray to evaluate for fracture.  He reports that there is no area of significant pain at this point and refuses need for x-ray.  UA performed today shows no UTI.  He is treated here today with 2 bags of IV Fluids.  He is given Rx for Levaquin 500mg  QD x 7 days.  He has scheduled f/u OV with me on Monday afternoon--this daughter is available to come  with him to that OV.  He is to go to ER if symptoms worsen in interim. Pt and daughter voice understanding, agree.    02/22/2018: Today pt looks much better. Says he feels a lot better too.  Reports he has been taking Levaquin as directed. Says cough is better already.  Says he has been up, walking around house some.  I discussed with pt and daughter--- whether they feel he is safe at home, can care for himself. Discussed cost of care at home, assisted living, nursing home etc. They are to discuss as a family regarding finances etc. I discussed that insurance should cover Physical Therapy and that could improve his strength and balance etc. He is agreeable to proceed with PT.  Daughter requests medication for him to use for allergy symptoms that is safe to use with his other meds--says he has had some sneezing, rhinorrhea. AT THAT OV: Placed order for physical therapy Obtained follow-up CBC and BMET. Also daughter plan to get life alert for patient to have available to call if needs help.    03/15/2018: Today I asked patient about physical therapy.  He states that he never heard anything from them.  He states that he does not have any type of voicemail or answering machine. He states that he has had no further falls or near falls. He does now have a life alert and is wearing this today. While he was here in the office during his visit I spoke to referral staff and they got his appointment scheduled with physical therapy and told him date and time for him to go for physical therapy as I was concerned he would not get that via phone message.  That is scheduled during today's visit. Today he reports that he did go to the kidney doctor.  He has paperwork and it is Dr. Noel Journey at [facility]. The paperwork indicates that they were ordering labs and kidney ultrasound.  States that he does have follow-up appointment scheduled to go back to them for follow-up.  He has no other  updates today.  Has no specific concerns to address today.  Today he reports that he has been taking medications as directed.  He is taking the Glucotrol 10 mg daily.  He did not bring in any blood sugar readings for me to review. He is taking the simvastatin and Niaspan.  Also still taking the gabapentin for neuropathy.     Addendum added 03/18/2018: Received note from [place].  Date of encounter 03/09/2018.  Dr. Terri Skains. His note includes the following: "HPI: This patient is an 83 year old male who presents with chronic kidney disease.  This is classified as stage 68. 83 year old white male with history of type 2 diabetes for > 35 years (A1c 7.4, as high as 8.9), hypertension, hyperlipidemia, neuropathy, nephrolithiasis,  referred by PCP for evaluation of elevated serum creatinine level and CKD management.   Patient had serum creatinine level of 2.32 on 02/22/2018, 2.5 on 02/17/2018, 1.6---11/2017, 1.36 --- 04/2015. He uses Aleve occasionally for pain management. Denies urinary symptoms. No lower extremity edema. No history of hepatitis or HIV.  No tobacco or excessive alcohol use.  No family history of kidney disease. He is on lisinopril.  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   Serum creatinine level gradually increasing at least since 2016 with last serum creatinine level 2.32 on 02/2018.   Check renal panel, urinalysis, spot urine PCR, hepatitis B, C, and HIV serology.   Plan to check CKD labs including anemia and bone disease as below.   US renal to evaluate kidney size and echogenicity.   Advised to avoid NSAIDs and avoid nephrotoxins.   I discussed the stages of kidney disease and the importance of better control of DM, HTN, and avoiding nephrotoxins in order to delay progression of CKD.   On KCl oral, we will follow-up today's lab." Hypertension,  renal disease: Today's impression: BP suboptimal.  On lisinopril.  Advised low-salt diet and monitor BP.  Addendum note was added by Dr. Junie Panning on 03/15/18 "Kidney function is better with creatinine 1.31.   Found to have low iron and hemoglobin 10.6.  Please call in for oral iron 325 mg twice daily. Iron sat 16, serum iron 43 PTH 14, vitamin D 29.5 "   Past Medical History:  Diagnosis Date  . Arthritis   . Diabetes mellitus    Type 2  . Diabetic neuropathy ([place])   . History of kidney stones   . HTN (hypertension) 03/03/2013  . Hypercholesterolemia   . 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 Skin cancer of arm 04/20/2013   left upper arm  . Spinal stenosis 05/17/2014   L3-L4   . Urgency of urination      Home Meds: Outpatient Medications Prior to Visit  Medication Sig Dispense Refill  . acetaminophen (TYLENOL) 500 MG tablet Take 1,000 mg by mouth every 8 (eight) hours as needed for mild pain or moderate pain (back pain).    Marland Kitchen aspirin EC 81 MG tablet Take 81 mg by mouth daily.      . Cetirizine HCl (ZYRTEC ALLERGY) 10 MG CAPS Take once daily as needed for allergy symptoms. (May cause drowsiness) 30 capsule 5  . cholecalciferol (VITAMIN D) 1000 UNITS tablet Take 1,000 Units by mouth daily.      . Cyanocobalamin (B-12 PO) Take 1 tablet by mouth daily.    . fish oil-omega-3 fatty acids 1000 MG capsule Take 3 g by mouth 2 (two) times daily.      Marland Kitchen gabapentin (NEURONTIN) 600 MG tablet TAKE 1 TABLET BY MOUTH THREE TIMES DAILY 90 tablet 3  . glipiZIDE (GLUCOTROL) 10 MG  tablet TAKE ONE TABLET BY MOUTH TWICE DAILY BEFORE  A  MEAL 180 tablet 3  . HYDROcodone-acetaminophen (NORCO) 7.5-325 MG tablet Take 1 tablet by mouth every 6 (six) hours as needed for moderate pain. 30 tablet 0  . lisinopril (PRINIVIL,ZESTRIL) 10 MG tablet Take 1 tablet (10 mg total) by mouth daily. 90 tablet 0  . Multiple Vitamin (MULITIVITAMIN WITH MINERALS) TABS Take 1 tablet by mouth daily.      . niacin (NIASPAN) 500  MG CR tablet TAKE 1 TABLET BY MOUTH AT BEDTIME 90 tablet 0  . ONE TOUCH ULTRA TEST test strip USE TO CHECK FASTING BLOOD SUGARS THREE TIMES DAILY 100 each 11  . potassium gluconate 595 MG TABS tablet Take 595 mg by mouth daily.    . simvastatin (ZOCOR) 40 MG tablet TAKE 1 TABLET BY MOUTH DAILY WITH  BREAKFAST 90 tablet 0   No facility-administered medications prior to visit.     Allergies:  Allergies  Allergen Reactions  . Actos [Pioglitazone]     LE Edema--03/2014    Social History   Socioeconomic History  . Marital status: Divorced    Spouse name: Not on file  . Number of children: Not on file  . Years of education: Not on file  . Highest education level: Not on file  Occupational History  . Not on file  Social Needs  . Financial resource strain: Not on file  . Food insecurity:    Worry: Not on file    Inability: Not on file  . Transportation needs:    Medical: Not on file    Non-medical: Not on file  Tobacco Use  . Smoking status: Former Smoker    Last attempt to quit: 03/04/2003    Years since quitting: 15.0  . Smokeless tobacco: Never Used  Substance and Sexual Activity  . Alcohol use: No  . Drug use: No  . Sexual activity: Not on file  Lifestyle  . Physical activity:    Days per week: Not on file    Minutes per session: Not on file  . Stress: Not on file  Relationships  . Social connections:    Talks on phone: Not on file    Gets together: Not on file    Attends religious service: Not on file    Active member of club or organization: Not on file    Attends meetings of clubs or organizations: Not on file    Relationship status: Not on file  . Intimate partner violence:    Fear of current or ex partner: Not on file    Emotionally abused: Not on file    Physically abused: Not on file    Forced sexual activity: Not on file  Other Topics Concern  . Not on file  Social History Narrative  . Not on file    History reviewed. No pertinent family history.    Review of Systems:  See HPI for pertinent ROS. All other ROS negative.     Physical Exam: Blood pressure 130/60, pulse 68, temperature 97.7 F (36.5 C), temperature source Oral, resp. rate 16, height 5\' 8"  (1.727 m), weight 91.7 kg (202 lb 3.2 oz), SpO2 95 %., Body mass index is 30.74 kg/m. General: WNWD WM. Appears in no acute distress. Neck: Supple. No thyromegaly. No lymphadenopathy.  No carotid bruit. Lungs: Clear bilaterally to auscultation without wheezes, rales, or rhonchi. Breathing is unlabored. Heart: RRR with S1 S2. No murmurs, rubs, or gallops. Abdomen: Soft, non-tender, non-distended with  normoactive bowel sounds. No hepatomegaly. No rebound/guarding. No obvious abdominal masses. Musculoskeletal:  Strength and tone normal for age. Extremities/Skin: Warm and dry.  No LE edema.  Neuro: Alert and oriented X 3. Moves all extremities spontaneously. Gait is normal. CNII-XII grossly in tact. Psych:  Responds to questions appropriately with a normal affect.    Diabetic foot exam: Inspection: On the dorsal aspect of his feet he has a lot of scaly skin. No open wounds or sores. Sensation is intact. Plantar surface of his feet look good. No calluses or wounds. Trace left DP pulse. Trace right DP pulse. No palpable posterior tibial pulses bilaterally.      ASSESSMENT AND PLAN:  83 y.o. year old male with    1. Type 2 diabetes mellitus with diabetic neuropathy, without long-term current use of insulin ([place])   12/07/2017:For him the Vania Rea is costing $45.  Celesta Gentile is costing $35. Today I checked for samples but we only have one bottle remaining of samples for the Jardiance and I have given him that.  We had no samples of the Januvia. I had discussion with him explaining that I do not have any cheaper options that we can use at this point. Discussed that our options are limited in the future if his A1c is elevated only things to consider adding would be once weekly Trulicity or  another med from that class or adding Lantus. He is to check fasting blood sugars and call me with these after checking for several days. Also, I told him that in general even down the road that if he thinks he is having uncontrolled blood sugars or thinks he is having any side effects from meds etc. to call and inform me rather than adjusting medicines on his own.  He voices understanding and agrees.  This point will plan for follow-up visit in 3 months but to follow-up sooner if needed and call with blood sugar readings after med changes.  Also in the past he has not been able to afford Invokana. Also in the past has history of lower extremity edema with Actos.  03/15/2018: Secondary to CKD and cost metformin has been discontinued and also Jardiance and Januvia.  Now is currently just on Glucotrol 10 mg.  Will check A1c and then follow-up accordingly.  He does have annual eye exam.  On ASA 81mg  QD On statin. On ACE inhibitor.  Has had Pneumovax 23 in past. Had Prevnar 13-- 12/2013.  Diabetic mononeuropathy associated with diabetes mellitus due to underlying condition ([place]) 03/15/2018---Stable/controlled with current dose of Neurontin  CKD (chronic kidney disease) stage 3, GFR 30-59 ml/min ([facility]) 03/15/2018: Some diabetic medications have been discontinued secondary to CKD.  Also he is now established with Dr. Carolin Sicks at [facility].  He is now managing CKD.  Addendum added 03/18/2018: Received note from [place].  Date of encounter 03/09/2018.  Dr. Terri Skains. His note includes the following: "HPI: This patient is an 83 year old male who presents with chronic kidney disease.  This is classified as stage 25. 83 year old white male with history of type 2 diabetes for > 35 years (A1c 7.4, as high as 8.9), hypertension, hyperlipidemia, neuropathy, nephrolithiasis,  referred by PCP for evaluation of elevated serum creatinine level and CKD management.   Patient  had serum creatinine level of 2.32 on 02/22/2018, 2.5 on 02/17/2018, 1.6---11/2017, 1.36 --- 04/2015. He uses Aleve occasionally for pain management. Denies urinary symptoms. No lower extremity edema. No history of hepatitis or HIV.  No tobacco or excessive alcohol use.  No family history of kidney disease. He is on lisinopril.  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   Serum creatinine level gradually increasing at least since 2016 with last serum creatinine level 2.32 on 02/2018.   Check renal panel, urinalysis, spot urine PCR, hepatitis B, C, and HIV serology.   Plan to check CKD labs including anemia and bone disease as below.   US renal to evaluate kidney size and echogenicity.   Advised to avoid NSAIDs and avoid nephrotoxins.   I discussed the stages of kidney disease and the importance of better control of DM, HTN, and avoiding nephrotoxins in order to delay progression of CKD.   On KCl oral, we will follow-up today's lab." Hypertension, renal disease: Today's impression: BP suboptimal.  On lisinopril.  Advised low-salt diet and monitor BP.  Addendum note was added by Dr. Junie Panning on 03/15/18 "Kidney function is better with creatinine 1.31.   Found to have low iron and hemoglobin 10.6.  Please call in for oral iron 325 mg twice daily. Iron sat 16, serum iron 43 PTH 14, vitamin D 29.5 "    Essential hypertension 03/15/2018:  Blood pressure is at goal/controlled. He is on ACE inhibitor.Check labs to monitor.   Hypercholesterolemia 03/15/2018---He is on Statin and Niaspan. Last FLP at goal. LFTs normal. Cont current meds.  Bilateral lower extremity edema 03/15/2018---He uses HCTZ 12.5mg  PRN swelling--no longer having to use daily.    screening PSA: Normal 07/2011. 05/27/2017---Will not repeat given advanced age   screening colonoscopy: At recent office visits we have  discussed that this was due.  At visit 06/2014 he says that he needs to get his back pain control prior to scheduling this. Given age 82, can probably stop these--will discuss with pt Discussed colonoscpoy with patient at visit 04/12/15. He does seem interested in wanting to have continued follow-up colonoscopies. However at today's visit he just says it to much is going on and for [place] to rediscuss it at his next visit once things settle down. Discussed visit 05/21/16 and he is agreeable for me to place order for referral to GI for follow-up colonoscopy.---Ordered referrral to GI -- 05/21/2016 08/21/2016---Has colonoscopy scheduled this upcoming week.  11/26/2016--He reports that Dr. Kalman Shan gave him hemeOccults cards to turn in --did not have to do colonoscopy  Immunizations: Pneumonia vaccine: Received Pneumovax here 05/01/2008. Updated with Prevnar 13 12/2013.Received 06/23/2016 Tetanus: Given here 07/23/2011 Zostavax: He checked with his insurance-- cost would be $200 patient defers. Shingrix---05/27/2017----currently on back-order---will discuss in future--when available Influenza Vaccine: Given here 08/06/2015      Signed, Karis Juba, [place], [facility] 03/15/2018 12:55 PM

## 2018-03-16 LAB — COMPLETE METABOLIC PANEL WITH GFR
AG RATIO: 1.5 (calc) (ref 1.0–2.5)
ALT: 14 U/L (ref 9–46)
AST: 16 U/L (ref 10–35)
Albumin: 3.9 g/dL (ref 3.6–5.1)
Alkaline phosphatase (APISO): 58 U/L (ref 40–115)
BILIRUBIN TOTAL: 0.4 mg/dL (ref 0.2–1.2)
BUN / CREAT RATIO: 20 (calc) (ref 6–22)
BUN: 27 mg/dL — ABNORMAL HIGH (ref 7–25)
CHLORIDE: 104 mmol/L (ref 98–110)
CO2: 26 mmol/L (ref 20–32)
Calcium: 9.5 mg/dL (ref 8.6–10.3)
Creat: 1.36 mg/dL — ABNORMAL HIGH (ref 0.70–1.11)
GFR, EST AFRICAN AMERICAN: 56 mL/min/{1.73_m2} — AB (ref >=60)
GFR, Est Non African American: 48 mL/min/{1.73_m2} — ABNORMAL LOW (ref >=60)
GLOBULIN: 2.6 g/dL (ref 1.9–3.7)
Glucose, Bld: 231 mg/dL — ABNORMAL HIGH (ref 65–99)
POTASSIUM: 4.4 mmol/L (ref 3.5–5.3)
SODIUM: 139 mmol/L (ref 135–146)
TOTAL PROTEIN: 6.5 g/dL (ref 6.1–8.1)

## 2018-03-16 LAB — HEMOGLOBIN A1C
EAG (MMOL/L): 12.5 (calc)
Hgb A1c MFr Bld: 9.5 % of total Hgb — ABNORMAL HIGH (ref <5.7)
Mean Plasma Glucose: 226 (calc)

## 2018-03-17 ENCOUNTER — Other Ambulatory Visit: Payer: Self-pay | Admitting: Physician Assistant

## 2018-03-17 ENCOUNTER — Other Ambulatory Visit: Payer: Self-pay | Admitting: Nephrology

## 2018-03-17 DIAGNOSIS — N184 Chronic kidney disease, stage 4 (severe): Secondary | ICD-10-CM

## 2018-03-18 ENCOUNTER — Encounter: Payer: Self-pay | Admitting: Physical Therapy

## 2018-03-18 ENCOUNTER — Ambulatory Visit: Payer: PPO | Attending: Physician Assistant | Admitting: Physical Therapy

## 2018-03-18 DIAGNOSIS — M6281 Muscle weakness (generalized): Secondary | ICD-10-CM

## 2018-03-18 DIAGNOSIS — R2681 Unsteadiness on feet: Secondary | ICD-10-CM | POA: Insufficient documentation

## 2018-03-18 DIAGNOSIS — R2689 Other abnormalities of gait and mobility: Secondary | ICD-10-CM

## 2018-03-18 NOTE — Patient Instructions (Signed)

## 2018-03-19 NOTE — Therapy (Signed)
[facility] [address] [place], [place], [address] Phone: [phone]   Fax:  [phone]  Physical Therapy Evaluation  Patient Details  Name: Christian [name] MRN: [id number] Date of Birth: December 10, 1934 Referring Provider: Dena Billet, [place]   Encounter Date: 03/18/2018  PT End of Session - 03/19/18 1022    Visit Number  1    Number of Visits  9    Date for PT Re-Evaluation  06/17/18    Authorization Type  [employer]    PT Start Time  1018    PT Stop Time  1058    PT Time Calculation (min)  40 min    Activity Tolerance  Patient tolerated treatment well    Behavior During Therapy  WFL for tasks assessed/performed       Past Medical History:  Diagnosis Date  . Arthritis   . Diabetes mellitus    Type 2  . Diabetic neuropathy ([place])   . History of kidney stones   . HTN (hypertension) 03/03/2013  . Hypercholesterolemia   . 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 Skin cancer of arm 04/20/2013   left upper arm  . Spinal stenosis 05/17/2014   L3-L4   . Urgency of urination     Past Surgical History:  Procedure Laterality Date  . LUMBAR LAMINECTOMY/DECOMPRESSION MICRODISCECTOMY Bilateral 11/22/2014   Procedure: Laminectomy and foraminotomy lumbar three-lumbar four bilateral ;  Surgeon: Eustace Moore, MD;  Location: [facility];  Service: Neurosurgery;  Laterality: Bilateral;  . SKIN CANCER EXCISION Left    shoulder and hand    There were no vitals filed for this visit.   Subjective Assessment - 03/18/18 1022    Subjective  Pt reports having two falls about 6-8 weeks ago, thinks because of dehydration.  He reports needing daughter to help him up from second fall.  Uses cane most of the time due to "bad knee."  Just can't bend down because I can't get back up.    Patient Stated Goals  "I don't know, because I get around pretty good."      Currently in Pain?  Yes    Pain Score  5     Pain Location  Back    Pain  Orientation  Lower    Pain Descriptors / Indicators  Aching    Pain Type  Chronic pain    Pain Onset  More than a month ago    Pain Frequency  Constant    Aggravating Factors   Turning the wrong way or bending over, lying in bed    Pain Relieving Factors  sitting down alleviates pain (sleeps in recliner)         [facility] PT Assessment - 03/18/18 1025      Assessment   Medical Diagnosis  dehydration, weakness    Referring Provider  Dena Billet, PA    Onset Date/Surgical Date  02/22/18 order date      Precautions   Precautions  Fall      Balance Screen   Has the patient fallen in the past 6 months  Yes    How many times?  2    Has the patient had a decrease in activity level because of a fear of falling?   No    Is the patient reluctant to leave their home because of a fear of falling?   No      Home Film/video editor residence  Living Arrangements  Alone    Available Help at Discharge  Family Daughter live 9 miles away    Type of [place]  One level      Prior Function   Level of Independence  Independent daughter assists with housecleaning and cooking    Leisure  Enjoys yardwork, enjoys going out to eat, but has not recently      Observation/Other Assessments   Focus on Therapeutic Outcomes (FOTO)   NA      ROM / Strength   AROM / PROM / Strength  Strength      Strength   Overall Strength  Deficits    Strength Assessment Site  Hip;Knee;Ankle    Right/Left Hip  Right;Left    Right Hip Flexion  3+/5    Left Hip Flexion  4/5    Right/Left Knee  Right;Left    Right Knee Flexion  4/5    Right Knee Extension  4/5    Left Knee Flexion  4/5    Left Knee Extension  4/5    Right/Left Ankle  Right;Left    Right Ankle Dorsiflexion  4/5    Left Ankle Dorsiflexion  4/5      Transfers   Transfers  Sit to Stand;Stand to Sit    Sit to Stand  6: Modified independent (Device/Increase time);With upper  extremity assist;From chair/3-in-1    Stand to Sit  6: Modified independent (Device/Increase time);With upper extremity assist;To chair/3-in-1      Ambulation/Gait   Ambulation/Gait  Yes    Ambulation/Gait Assistance  5: Supervision    Ambulation Distance (Feet)  200 Feet    Assistive device  Straight cane;None    Gait Pattern  Step-through pattern;Trendelenburg;Lateral trunk lean to right    Ambulation Surface  Level;Indoor    Gait velocity  15.68 sec (2.09 ft/sec) no cane; 21.53 sec (1.52 ft/sec) cane      Standardized Balance Assessment   Standardized Balance Assessment  Timed Up and Go Test;Berg Balance Test      Berg Balance Test   Sit to Stand  Able to stand  independently using hands    Standing Unsupported  Able to stand safely 2 minutes    Sitting with Back Unsupported but Feet Supported on Floor or Stool  Able to sit safely and securely 2 minutes    Stand to Sit  Controls descent by using hands    Transfers  Able to transfer safely, definite need of hands    Standing Unsupported with Eyes Closed  Able to stand 10 seconds with supervision    Standing Ubsupported with Feet Together  Able to place feet together independently and stand for 1 minute with supervision    From Standing, Reach Forward with Outstretched Arm  Reaches forward but needs supervision    From Standing Position, Pick up Object from Floor  Able to pick up shoe, needs supervision    From Standing Position, Turn to Look Behind Over each Shoulder  Needs supervision when turning    Turn 360 Degrees  Able to turn 360 degrees safely but slowly    Standing Unsupported, Alternately Place Feet on Step/Stool  Needs assistance to keep from falling or unable to try    Standing Unsupported, One Foot in Front  Able to take small step independently and hold 30 seconds    Standing on One Leg  Unable to try or needs assist  to prevent fall    Total Score  32    Berg comment:  Scores <45/56 indicate increased fall risk       Timed Up and Go Test   Normal TUG (seconds)  24.65    TUG Comments  Scores >13.5 seconds indicate increased fall risk                Objective measurements completed on examination: See above findings.              PT Education - 03/19/18 0856    Education Details  objective measures indicating fall risk; fall prevention education for home environment    Person(s) Educated  Patient    Methods  Explanation;Handout    Comprehension  Verbalized understanding          PT Long Term Goals - 03/19/18 1029      PT LONG TERM GOAL #1   Title  Pt will be independent with HEP for improved balance, strength, gait.  TARGET 04/16/18    Time  4    Period  Weeks    Status  New    Target Date  04/16/18      PT LONG TERM GOAL #2   Title  Pt will improve Berg Balance score to at least 40/56 for decreased fall risk.    Time  4    Period  Weeks    Status  New    Target Date  04/16/18      PT LONG TERM GOAL #3   Title  Pt will improve TUG score to less than or equal to 18 seconds for decreased fall risk.    Time  4    Period  Weeks    Target Date  04/16/18      PT LONG TERM GOAL #4   Title  Pt will improve gait velocity to at least 1.8 ft/sec with cane for improved gait efficiency and safety.    Time  4    Period  Weeks    Status  New    Target Date  04/16/18      PT LONG TERM GOAL #5   Title  Pt will verbalize understanding of fall prevention in home environment.    Time  4    Period  Weeks    Status  New    Target Date  04/16/18             Plan - 03/19/18 1025    Clinical Impression Statement  Pt is an 83 year old male referred to OP PT s/p dehydration, weakness and falls episodein May 2019.  Pt presents with decreased gait speed/gait instability, decreased balance, decreased muscle strength.  Pt is living alone and enjoys doing yardwork, and pt would benefit from skilled PT to address the above stated deficits to decrease fall risk and improve  functional mobility.    History and Personal Factors relevant to plan of care:  PHM >3 co-morbidities, including HTN, DM, spinal stenosis; history of 2 falls (associated with dehydration episode)    Clinical Presentation  Stable    Clinical Presentation due to:  fall risk per gait velocity, Berg, TUG    Clinical Decision Making  Low    Rehab Potential  Good    PT Frequency  2x / week    PT Duration  4 weeks plus eval    PT Treatment/Interventions  ADLs/Self Care Home Management;DME Instruction;Balance training;Therapeutic exercise;Therapeutic activities;Functional mobility training;Gait training;Neuromuscular re-education;Patient/family education  PT Next Visit Plan  Initiate HEP-OTAGO and walking program    Consulted and Agree with Plan of Care  Patient       Patient will benefit from skilled therapeutic intervention in order to improve the following deficits and impairments:  Abnormal gait, Decreased balance, Decreased mobility, Difficulty walking, Decreased strength  Visit Diagnosis: Unsteadiness on feet  Other abnormalities of gait and mobility  Muscle weakness (generalized)     Problem List Patient Active Problem List   Diagnosis Date Noted  . CKD (chronic kidney disease) stage 3, GFR 30-59 ml/min (HCC) 09/02/2017  . Primary osteoarthritis of right knee 06/22/2017  . S/P lumbar laminectomy 11/22/2014  . 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 Bilateral lower extremity edema 06/14/2014  . Low back pain 03/13/2014  . Bilateral pseudophakia 06/27/2013  . Diabetic neuropathy ([place])   . Skin cancer of arm 04/20/2013  . Type 2 diabetes mellitus with diabetic neuropathy, without long-term current use of insulin ([place]) 03/03/2013  . HTN (hypertension) 03/03/2013  . Hypercholesterolemia     Yazleen Molock W. 03/19/2018, 10:33 AM Frazier Butt., PT  [facility] [address] [place], [place], [address] Phone: [phone]   Fax:  [phone]  Name: Christian [name] MRN: [id number] Date of Birth: 1934/10/28

## 2018-03-24 ENCOUNTER — Encounter: Payer: Self-pay | Admitting: Physical Therapy

## 2018-03-24 ENCOUNTER — Encounter: Payer: Self-pay | Admitting: Physician Assistant

## 2018-03-24 ENCOUNTER — Ambulatory Visit (INDEPENDENT_AMBULATORY_CARE_PROVIDER_SITE_OTHER): Payer: PPO | Admitting: Physician Assistant

## 2018-03-24 ENCOUNTER — Ambulatory Visit: Payer: PPO | Admitting: Physical Therapy

## 2018-03-24 VITALS — BP 142/64 | HR 76 | Temp 97.8°F | Resp 14 | Ht 68.0 in | Wt 202.8 lb

## 2018-03-24 DIAGNOSIS — E118 Type 2 diabetes mellitus with unspecified complications: Secondary | ICD-10-CM

## 2018-03-24 DIAGNOSIS — R2689 Other abnormalities of gait and mobility: Secondary | ICD-10-CM

## 2018-03-24 DIAGNOSIS — R2681 Unsteadiness on feet: Secondary | ICD-10-CM

## 2018-03-24 DIAGNOSIS — Z794 Long term (current) use of insulin: Secondary | ICD-10-CM

## 2018-03-24 DIAGNOSIS — N183 Chronic kidney disease, stage 3 unspecified: Secondary | ICD-10-CM

## 2018-03-24 DIAGNOSIS — M6281 Muscle weakness (generalized): Secondary | ICD-10-CM

## 2018-03-24 DIAGNOSIS — E1342 Other specified diabetes mellitus with diabetic polyneuropathy: Secondary | ICD-10-CM | POA: Diagnosis not present

## 2018-03-24 MED ORDER — BASAGLAR KWIKPEN 100 UNIT/ML ~~LOC~~ SOPN: PEN_INJECTOR | SUBCUTANEOUS | 3 refills | Status: DC

## 2018-03-24 NOTE — Patient Instructions (Signed)
OTAGO program:   Standing head turns through minisquats-pt provided folder    Requested pt perform at least 3x/wk

## 2018-03-24 NOTE — Progress Notes (Signed)
Patient ID: Christian [name] MRN: [id number], DOB: [birth year], 83 y.o. Date of Encounter: @DATE @  Chief Complaint:  Chief Complaint  Patient presents with  . follow up with diabetes    HPI: 83 y.o. year old male  presents for routine 3 months f/u OV to f/u DM, HTN, and other chronic medical problems.   Hypertension: 12/03/2017: Taking meds as directed. No adverse effects.  Hyperlipidemia: 12/03/2017: On Zocor 40mg  and on Niaspan. No myalgias or other adverse medication effects.  Diabetes: In the past we had difficulty getting his diabetes controlled and he was very noncompliant with diet. All of this information is detailed and documented in my note 05/21/16. See that note for details of all of the past information. 03/2014 he was having lower extremity edema so Actos was stopped and was changed to [place]. Also --have added Invokana, but unable to afford- At the time of his OV 09/02/2017---He was on:  metformin, Glucotrol, Januvia. Labs done 09/02/17 showed elevated creatinine.  At that time I said to stop metformin and to decrease Januvia to 50 mg. Today--12/03/2017--- he states that when he stopped the metformin and decreased the Januvia, that his blood sugar went up really high and his vision got blurry.   He says that that scared him so he went back on the metformin and went back on the Januvia 100 mg.  At [place] 12/03/2017: Have told him to stop the metformin and decrease the Januvia to 50 mg.  Will continue the Glucotrol XL 10 mg daily.   To try to keep his blood sugar controlled with having to stop the metformin and decrease the Januvia,  I am adding Jardiance 25 mg every morning. I will have him come in for follow-up visit on Monday to make sure he has been able to make these medicine changes and to make sure that his blood sugar is controlled.   12/07/2017: Today he reports that he has been taking medications as directed at his recent visit 12/03/17. He did stop the metformin  and he did decrease the Januvia down to 50 mg. He did continue the Glucotrol 10 mg daily. He did add the Jardiance 25 mg daily. I asked if he had some blood sugar readings for me to review.  He says " [name], I forgot to even check any blood sugars--- I was so busy adjusting these medicines." Says that the Jardiance cost $45 for him.  Says the Januvia cost him $35. He states that he is feeling fine after making these medicine adjustments.  He is having no adverse effects.       ------------------------------------------------------------------------------------------------------------------------------------------------------------------------------------------------------------------------------------------------------------------------  03/15/2018:  Today I reviewed his chart.   --Reviewed that his last routine 11-month office visit with me was 12/07/2017 which is documented above. --I reviewed that since that visit he had 2 visits with me regarding a fall at home.  The following information is copied from those visits.    02/18/2018: His daughter accompanies him for visit today. Had reviewed that his last routine visit with me was at the beginning of March and that at that visit things were fairly stable.  We had just recently adjusted some of his diabetic medications secondary to CKD.  Otherwise things were stable and from a symptomatic standpoint patient had been stable and had not been reporting any concerns or symptoms.  However, today this daughter who accompanies him today states that she has been noticing a decline with him for months.  Has noticed him  having more problems with his short-term memory.  Says that she knows that he comes in here for his visits and does not tell me that he is having any issues.  Says that he tries to appear as if things are fine and that he is handling everything fine but she does not think he is.  She states that she lives about 15 to 20 minutes from  him.  Says that on average she usually sees him 1-2 times per week but she had not seen him for 2 weeks up until yesterday-- prior to yesterday had not seen him for 2 weeks.  However says that she does talk to him on the phone more often.  States that he also has 1 son that lives in [place]. States that he has another son that lives in [place].  Has these 3 children.  She reports that all 3 of them work full-time. She works 4:30 am - noon.  We discussed that she would be available in the afternoons to come with him to follow-up office visits with me.  She reports that they have tried to bring up the issue that he seems to need some assistance but that he seems to "not want to hear it ".  She says that she "does not know whether he is just too proud or just too independent."  They report that he fell yesterday morning and was on the floor for an extended amount of time.  Patient states that he did not have a phone and that he had fallen and was on the floor and he tried to crawl but could not because his leg hurt.  Says that he finally got himself to a recliner/chair that he was able to hold to and try to walk but he held to the chair and collapsed again.  Somehow he did eventually get to a phone and called his daughter.  Says that all she heard on the phone was him saying "help me, help me".  She took him to the ER and I do have chest x-ray report and lab report that was done there but then he left the ER before evaluation was complete.  Today I reviewed with patient and daughter that significant findings from the labs and x-ray from the ER show: Troponin negative. Chest x-ray showed no acute abnormality Lab was significant for elevated white count Lab also significant for significant elevation of BUN and mild elevation of creatinine consistent with dehydration.  Discussed with patient and daughter that ideally he does need to be completely evaluated and completely treated at the  emergency room/hospital.  However he refuses to go back to the hospital. Discussed with patient and daughter that my plan would be to give him IV fluids here and to evaluate because of infection/elevated white count.  They both report that he has had significant congested cough for about 2 weeks.  Daughter states that even with talking to him on the telephone that she has been able to tell that he has had a "bad cough "for about 2 weeks.  Denies any other signs or symptoms of infection.  Has had no dysuria.  No pain in his suprapubic/pelvic region.  No abdominal pain.  No vomiting or diarrhea.  He has not been having any fevers or chills.    A/P AT THAT OV----  Discussed with patient and daughter that ideally he does need to be completely evaluated and completely treated at the emergency room/hospital.  However he refuses to  go back to the hospital. Discussed with patient and daughter that my plan would be to give him IV fluids here and to evaluate cause of infection/elevated white count.  They both report that he has had significant congested cough for about 2 weeks.  Daughter states that even with talking to him on the telephone that she has been able to tell that he has had a "bad cough "for about 2 weeks.  Denies any other signs or symptoms of infection.  Has had no dysuria.  No pain in his suprapubic/pelvic region.  No abdominal pain.  No vomiting or diarrhea.  He has not been having any fevers or chills.  Discussed whether his leg still has pain whether he is having any area of pain that needs x-ray to evaluate for fracture.  He reports that there is no area of significant pain at this point and refuses need for x-ray.  UA performed today shows no UTI.  He is treated here today with 2 bags of IV Fluids.  He is given Rx for Levaquin 55m QD x 7 days.  He has scheduled f/u OV with me on Monday afternoon--this daughter is available to come with him to that OV.  He is to go to ER if  symptoms worsen in interim. Pt and daughter voice understanding, agree.    02/22/2018: Today pt looks much better. Says he feels a lot better too.  Reports he has been taking Levaquin as directed. Says cough is better already.  Says he has been up, walking around house some.  I discussed with pt and daughter--- whether they feel he is safe at home, can care for himself. Discussed cost of care at home, assisted living, nursing home etc. They are to discuss as a family regarding finances etc. I discussed that insurance should cover Physical Therapy and that could improve his strength and balance etc. He is agreeable to proceed with PT.  Daughter requests medication for him to use for allergy symptoms that is safe to use with his other meds--says he has had some sneezing, rhinorrhea. AT THAT OV: Placed order for physical therapy Obtained follow-up CBC and BMET. Also daughter plan to get life alert for patient to have available to call if needs help.    03/15/2018: Today I asked patient about physical therapy.  He states that he never heard anything from them.  He states that he does not have any type of voicemail or answering machine. He states that he has had no further falls or near falls. He does now have a life alert and is wearing this today. While he was here in the office during his visit I spoke to referral staff and they got his appointment scheduled with physical therapy and told him date and time for him to go for physical therapy as I was concerned he would not get that via phone message.  That is scheduled during today's visit. Today he reports that he did go to the kidney doctor.  He has paperwork and [facility] The paperwork indicates that they were ordering labs and kidney ultrasound.  States that he does have follow-up appointment scheduled to go back to them for follow-up.  He has no other updates today.  Has no specific concerns to  address today.  Today he reports that he has been taking medications as directed.  He is taking the Glucotrol 10 mg daily.  He did not bring in any blood sugar  readings for me to review. He is taking the simvastatin and Niaspan.  Also still taking the gabapentin for neuropathy.     Addendum added 03/18/2018: Received note from [facility].  Date of encounter 03/09/2018.  Dr. Terri Skains. His note includes the following: "HPI: This patient is an 83 year old male who presents with chronic kidney disease.  This is classified as stage 46. 83 year old white male with history of type 2 diabetes for > 35 years (A1c 7.4, as high as 8.9), hypertension, hyperlipidemia, neuropathy, nephrolithiasis,  referred by PCP for evaluation of elevated serum creatinine level and CKD management.   Patient had serum creatinine level of 2.32 on 02/22/2018, 2.5 on 02/17/2018, 1.6---11/2017, 1.36 --- 04/2015. He uses Aleve occasionally for pain management. Denies urinary symptoms. No lower extremity edema. No history of hepatitis or HIV.  No tobacco or excessive alcohol use.  No family history of kidney disease. He is on lisinopril.  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   Serum creatinine level gradually increasing at least since 2016 with last serum creatinine level 2.32 on 02/2018.   Check renal panel, urinalysis, spot urine PCR, hepatitis B, C, and HIV serology.   Plan to check CKD labs including anemia and bone disease as below.   US renal to evaluate kidney size and echogenicity.   Advised to avoid NSAIDs and avoid nephrotoxins.   I discussed the stages of kidney disease and the importance of better control of DM, HTN, and avoiding nephrotoxins in order to delay progression of CKD.   On KCl oral, we will follow-up today's lab." Hypertension, renal disease: Today's impression: BP  suboptimal.  On lisinopril.  Advised low-salt diet and monitor BP.  Addendum note was added by Dr. Junie Panning on 03/15/18 "Kidney function is better with creatinine 1.31.   Found to have low iron and hemoglobin 10.6.  Please call in for oral iron 325 mg twice daily. Iron sat 16, serum iron 43 PTH 14, vitamin D 29.5 "   03/24/2018: At visit 03/15/2018 labs came back showing A1c 9.5. At that time had him schedule another OV with me for a 30-minute slot to discuss starting basal insulin. Returns for that visit today. So today I did review that he did have OV with [facility] and reviewed above addendum with their information. Also today I did review that he has been having visits with physical therapy. He has no concerns to address today.  Is here to discuss insulin. 30 minutes was spent in face-to-face interaction with him discussing: --- Stop glipizide/Glucotrol. --- Check fasting blood sugar every morning and document on the log form that I have given him today. --- Give insulin 15 units each morning. --- Scheduled follow-up visit with me on Monday at 11:00 AM.  He is to bring that blood sugar log form with him to that visit. Today I have given him a sample of Basaglar. I have explained that his insurance may cover a different brand name of insulin but that will be an equivalent. Have shown him how to use the insulin pen and how to dial to the 15 units. Written all information down on paper for him to have to refer to at home. Have answered all questions and he voices understanding.     Past Medical History:  Diagnosis Date  . Arthritis   . Diabetes mellitus    Type 2  . Diabetic  neuropathy ([place])   . History of kidney stones   . HTN (hypertension) 03/03/2013  . Hypercholesterolemia   . 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 Skin cancer of arm 04/20/2013   left upper arm  . Spinal stenosis 05/17/2014   L3-L4   . Urgency of urination      Home Meds: Outpatient  Medications Prior to Visit  Medication Sig Dispense Refill  . acetaminophen (TYLENOL) 500 MG tablet Take 1,000 mg by mouth every 8 (eight) hours as needed for mild pain or moderate pain (back pain).    Marland Kitchen aspirin EC 81 MG tablet Take 81 mg by mouth daily.      . Cetirizine HCl (ZYRTEC ALLERGY) 10 MG CAPS Take once daily as needed for allergy symptoms. (May cause drowsiness) 30 capsule 5  . cholecalciferol (VITAMIN D) 1000 UNITS tablet Take 1,000 Units by mouth daily.      . Cyanocobalamin (B-12 PO) Take 1 tablet by mouth daily.    . fish oil-omega-3 fatty acids 1000 MG capsule Take 3 g by mouth 2 (two) times daily.      Marland Kitchen gabapentin (NEURONTIN) 600 MG tablet TAKE 1 TABLET BY MOUTH THREE TIMES DAILY 90 tablet 3  . glipiZIDE (GLUCOTROL) 10 MG tablet TAKE ONE TABLET BY MOUTH TWICE DAILY BEFORE  A  MEAL 180 tablet 3  . HYDROcodone-acetaminophen (NORCO) 7.5-325 MG tablet Take 1 tablet by mouth every 6 (six) hours as needed for moderate pain. 30 tablet 0  . lisinopril (PRINIVIL,ZESTRIL) 10 MG tablet Take 1 tablet (10 mg total) by mouth daily. 90 tablet 0  . Multiple Vitamin (MULITIVITAMIN WITH MINERALS) TABS Take 1 tablet by mouth daily.      . niacin (NIASPAN) 500 MG CR tablet TAKE 1 TABLET BY MOUTH AT BEDTIME 90 tablet 0  . ONE TOUCH ULTRA TEST test strip USE TO CHECK FASTING BLOOD SUGARS THREE TIMES DAILY 100 each 11  . potassium gluconate 595 MG TABS tablet Take 595 mg by mouth daily.    . simvastatin (ZOCOR) 40 MG tablet TAKE 1 TABLET BY MOUTH DAILY WITH  BREAKFAST 90 tablet 0   No facility-administered medications prior to visit.     Allergies:  Allergies  Allergen Reactions  . Actos [Pioglitazone]     LE Edema--03/2014    Social History   Socioeconomic History  . Marital status: Divorced    Spouse name: Not on file  . Number of children: Not on file  . Years of education: Not on file  . Highest education level: Not on file  Occupational History  . Not on file  Social Needs  .  Financial resource strain: Not on file  . Food insecurity:    Worry: Not on file    Inability: Not on file  . Transportation needs:    Medical: Not on file    Non-medical: Not on file  Tobacco Use  . Smoking status: Former Smoker    Last attempt to quit: 03/04/2003    Years since quitting: 15.0  . Smokeless tobacco: Never Used  Substance and Sexual Activity  . Alcohol use: No  . Drug use: No  . Sexual activity: Not on file  Lifestyle  . Physical activity:    Days per week: Not on file    Minutes per session: Not on file  . Stress: Not on file  Relationships  . Social connections:    Talks on phone: Not on file    Gets together: Not on file  Attends religious service: Not on file    Active member of club or organization: Not on file    Attends meetings of clubs or organizations: Not on file    Relationship status: Not on file  . Intimate partner violence:    Fear of current or ex partner: Not on file    Emotionally abused: Not on file    Physically abused: Not on file    Forced sexual activity: Not on file  Other Topics Concern  . Not on file  Social History Narrative  . Not on file    History reviewed. No pertinent family history.   Review of Systems:  See HPI for pertinent ROS. All other ROS negative.     Physical Exam: Blood pressure (!) 142/64, pulse 76, temperature 97.8 F (36.6 C), temperature source Oral, resp. rate 14, height 5\' 8"  (1.727 m), weight 92 kg (202 lb 12.8 oz), SpO2 98 %., Body mass index is 30.84 kg/m. General: WNWD WM. Appears in no acute distress. Neck: Supple. No thyromegaly. No lymphadenopathy. Lungs: Clear bilaterally to auscultation without wheezes, rales, or rhonchi. Breathing is unlabored. Heart: RRR with S1 S2. No murmurs, rubs, or gallops. Abdomen: Soft, non-tender, non-distended with normoactive bowel sounds. No hepatomegaly. No rebound/guarding. No obvious abdominal masses. Musculoskeletal:  Strength and tone normal for  age. Extremities/Skin: Warm and dry.  No edema.  Neuro: Alert and oriented X 3. Moves all extremities spontaneously. Gait is normal. CNII-XII grossly in tact. Psych:  Responds to questions appropriately with a normal affect.    Diabetic foot exam: Inspection: On the dorsal aspect of his feet he has a lot of scaly skin. No open wounds or sores. Sensation is intact. Plantar surface of his feet look good. No calluses or wounds. Trace left DP pulse. Trace right DP pulse. No palpable posterior tibial pulses bilaterally.      ASSESSMENT AND PLAN:  83 y.o. year old male with    1. Type 2 diabetes mellitus with complication, with long-term current use of insulin ([place])  2. Diabetic polyneuropathy associated with other specified diabetes mellitus ([place])  3. CKD (chronic kidney disease) stage 3, GFR 30-59 ml/min ([facility])  03/24/2018: 30 minutes was spent in face-to-face interaction with him  At visit 03/15/2018 labs came back showing A1c 9.5. At that time had him schedule another OV with me for a 30-minute slot to discuss starting basal insulin. Returns for that visit today. So today I did review that he did have OV with [facility] and reviewed above addendum with their information. Also today I did review that he has been having visits with physical therapy. He has no concerns to address today.  Is here to discuss insulin. 30 minutes was spent in face-to-face interaction with him discussing: --- Stop glipizide/Glucotrol. --- Check fasting blood sugar every morning and document on the log form that I have given him today. --- Give insulin 15 units each morning. --- Scheduled follow-up visit with me on Monday at 11:00 AM.  He is to bring that blood sugar log form with him to that visit. Today I have given him a sample of Basaglar. I have explained that his insurance may cover a different brand name of insulin but that will be an equivalent. Have shown him how to use the insulin pen  and how to dial to the 15 units. Written all information down on paper for him to have to refer to at home. Have answered all questions and he voices understanding.  Monday he has an appointment with physical therapy at 8:45.  He will go to that and then come here for visit at 11:00.  He will follow-up sooner if needed.     THE FOLLOWING IS PULLED FORWARD FROM OV NOTE 03/15/2018---NOT ADDRESSED AT OV 03/24/2018:  12/07/2017:For him the Vania Rea is costing $45.  Celesta Gentile is costing $35. Today I checked for samples but we only have one bottle remaining of samples for the Jardiance and I have given him that.  We had no samples of the Januvia. I had discussion with him explaining that I do not have any cheaper options that we can use at this point. Discussed that our options are limited in the future if his A1c is elevated only things to consider adding would be once weekly Trulicity or another med from that class or adding Lantus. He is to check fasting blood sugars and call me with these after checking for several days. Also, I told him that in general even down the road that if he thinks he is having uncontrolled blood sugars or thinks he is having any side effects from meds etc. to call and inform me rather than adjusting medicines on his own.  He voices understanding and agrees.  This point will plan for follow-up visit in 3 months but to follow-up sooner if needed and call with blood sugar readings after med changes.  Also in the past he has not been able to afford Invokana. Also in the past has history of lower extremity edema with Actos.  03/15/2018: Secondary to CKD and cost metformin has been discontinued and also Jardiance and Januvia.  Now is currently just on Glucotrol 10 mg.  Will check A1c and then follow-up accordingly.  He does have annual eye exam.  On ASA 81mg  QD On statin. On ACE inhibitor.  Has had Pneumovax 23 in past. Had Prevnar 13-- 12/2013.  Diabetic mononeuropathy  associated with diabetes mellitus due to underlying condition ([place]) 03/15/2018---Stable/controlled with current dose of Neurontin  CKD (chronic kidney disease) stage 3, GFR 30-59 ml/min ([facility]) 03/15/2018: Some diabetic medications have been discontinued secondary to CKD.  Also he is now established with Dr. Carolin Sicks at [facility].  He is now managing CKD.  Addendum added 03/18/2018: Received note from [facility].  Date of encounter 03/09/2018.  Dr. Terri Skains. His note includes the following: "HPI: This patient is an 83 year old male who presents with chronic kidney disease.  This is classified as stage 54. 83 year old white male with history of type 2 diabetes for > 35 years (A1c 7.4, as high as 8.9), hypertension, hyperlipidemia, neuropathy, nephrolithiasis,  referred by PCP for evaluation of elevated serum creatinine level and CKD management.   Patient had serum creatinine level of 2.32 on 02/22/2018, 2.5 on 02/17/2018, 1.6---11/2017, 1.36 --- 04/2015. He uses Aleve occasionally for pain management. Denies urinary symptoms. No lower extremity edema. No history of hepatitis or HIV.  No tobacco or excessive alcohol use.  No family history of kidney disease. He is on lisinopril.  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   Serum creatinine level gradually increasing at least since 2016 with last serum creatinine level 2.32 on 02/2018.   Check renal panel, urinalysis, spot urine PCR, hepatitis B, C, and HIV serology.   Plan to check CKD labs including anemia and bone disease as below.   US renal to evaluate kidney size  and echogenicity.   Advised to avoid NSAIDs and avoid nephrotoxins.   I discussed the stages of kidney disease and the importance of better control of DM, HTN, and avoiding nephrotoxins in order to delay progression of CKD.   On KCl oral, we  will follow-up today's lab." Hypertension, renal disease: Today's impression: BP suboptimal.  On lisinopril.  Advised low-salt diet and monitor BP.  Addendum note was added by Dr. Junie Panning on 03/15/18 "Kidney function is better with creatinine 1.31.   Found to have low iron and hemoglobin 10.6.  Please call in for oral iron 325 mg twice daily. Iron sat 16, serum iron 43 PTH 14, vitamin D 29.5 "    Essential hypertension 03/15/2018:  Blood pressure is at goal/controlled. He is on ACE inhibitor.Check labs to monitor.   Hypercholesterolemia 03/15/2018---He is on Statin and Niaspan. Last FLP at goal. LFTs normal. Cont current meds.  Bilateral lower extremity edema 03/15/2018---He uses HCTZ 12.5mg  PRN swelling--no longer having to use daily.    screening PSA: Normal 07/2011. 05/27/2017---Will not repeat given advanced age   screening colonoscopy: At recent office visits we have discussed that this was due.  At visit 06/2014 he says that he needs to get his back pain control prior to scheduling this. Given age 55, can probably stop these--will discuss with pt Discussed colonoscpoy with patient at visit 04/12/15. He does seem interested in wanting to have continued follow-up colonoscopies. However at today's visit he just says it to much is going on and for [place] to rediscuss it at his next visit once things settle down. Discussed visit 05/21/16 and he is agreeable for me to place order for referral to GI for follow-up colonoscopy.---Ordered referrral to GI -- 05/21/2016 08/21/2016---Has colonoscopy scheduled this upcoming week.  11/26/2016--He reports that Dr. Kalman Shan gave him [employer]ults cards to turn in --did not have to do colonoscopy  Immunizations: Pneumonia vaccine: Received Pneumovax here 05/01/2008. Updated with Prevnar 13 12/2013.Received 06/23/2016 Tetanus: Given here 07/23/2011 Zostavax: He checked with his insurance-- cost would be $200 patient  defers. Shingrix---05/27/2017----currently on back-order---will discuss in future--when available Influenza Vaccine: Given here 08/06/2015      Signed, Karis Juba, [place], [facility] 03/24/2018 12:30 PM

## 2018-03-24 NOTE — Therapy (Signed)
[facility] [address] [place], [place], [address] Phone: [phone]   Fax:  [phone]  Physical Therapy Treatment  Patient Details  Name: Christian [name] MRN: [id number] Date of Birth: 03/31/1935 Referring Provider: Dena Billet, [place]   Encounter Date: 03/24/2018  PT End of Session - 03/24/18 0852    Visit Number  2    Number of Visits  9    Date for PT Re-Evaluation  06/17/18    Authorization Type  [employer]    PT Start Time  0813 pt arrived late    PT Stop Time  0844    PT Time Calculation (min)  31 min    Activity Tolerance  Patient tolerated treatment well No reported increase in pain during session    Behavior During Therapy  [facility] for tasks assessed/performed       Past Medical History:  Diagnosis Date  . Arthritis   . Diabetes mellitus    Type 2  . Diabetic neuropathy ([place])   . History of kidney stones   . HTN (hypertension) 03/03/2013  . Hypercholesterolemia   . 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 Skin cancer of arm 04/20/2013   left upper arm  . Spinal stenosis 05/17/2014   L3-L4   . Urgency of urination     Past Surgical History:  Procedure Laterality Date  . LUMBAR LAMINECTOMY/DECOMPRESSION MICRODISCECTOMY Bilateral 11/22/2014   Procedure: Laminectomy and foraminotomy lumbar three-lumbar four bilateral ;  Surgeon: Eustace Moore, MD;  Location: [facility];  Service: Neurosurgery;  Laterality: Bilateral;  . SKIN CANCER EXCISION Left    shoulder and hand    There were no vitals filed for this visit.  Subjective Assessment - 03/24/18 0815    Subjective  Been driving around for about an hour; took a wrong turn and ended up on the wrong side of town.  "But I'm here now."    Patient Stated Goals  "I don't know, because I get around pretty good."      Currently in Pain?  Yes    Pain Score  1     Pain Location  Back    Pain Orientation  Lower    Pain Descriptors / Indicators  Aching    Pain Type  Chronic pain    Pain Onset  More than a month ago    Pain Frequency  Constant    Aggravating Factors   Twisting, turning the wrong way, lying down in bed    Pain Relieving Factors  sitting down alleviates pain                       [facility] Adult PT Treatment/Exercise - 03/24/18 0845      Transfers   Transfers  Sit to Stand;Stand to Sit    Sit to Stand  6: Modified independent (Device/Increase time);With upper extremity assist;From chair/3-in-1    Stand to Sit  6: Modified independent (Device/Increase time);With upper extremity assist;To chair/3-in-1    Number of Reps  Other reps (comment) 5 reps throughout session, 2 reps from mat surface      Ambulation/Gait   Ambulation/Gait  Yes    Ambulation/Gait Assistance  5: Supervision    Ambulation/Gait Assistance Details  Did not bring in cane today, and one episode of L foot catching with gait.  Educated patient again in fall risk per eval measures and requested pt use cane at all times.    Ambulation Distance (Feet)  100 Feet 200    Assistive device  None    Gait Pattern  Step-through pattern;Trendelenburg;Lateral trunk lean to right;Poor foot clearance - left    Ambulation Surface  Level;Indoor          Balance Exercises - 03/24/18 0819      OTAGO PROGRAM   Head Movements  Sitting;Standing;5 reps    Neck Movements  Standing;5 reps    Back Extension  Standing;5 reps    Trunk Movements  -- Did not attempt-pt request    Ankle Movements  Sitting;10 reps    Knee Extensor  10 reps    Knee Flexor  10 reps    Hip ABductor  10 reps at counter, bilat UE support    Ankle Plantorflexors  20 reps, support    Ankle Dorsiflexors  20 reps, support    Knee Bends  10 reps, support    Backwards Walking  Support has to stop due to R knee feeling it will buckle      Pt requires 2 brief seated breaks during OTAGO, during which PT educated patient in OTAGO handouts/instructions for HEP.  PT Education - 03/24/18 0852     Education Details  HEP-initiated OTAGO program and provided folder-see instructions; requested patient use cane at all times    Person(s) Educated  Patient    Methods  Explanation;Demonstration;Handout    Comprehension  Verbalized understanding;Returned demonstration          PT Long Term Goals - 03/19/18 1029      PT LONG TERM GOAL #1   Title  Pt will be independent with HEP for improved balance, strength, gait.  TARGET 04/16/18    Time  4    Period  Weeks    Status  New    Target Date  04/16/18      PT LONG TERM GOAL #2   Title  Pt will improve Berg Balance score to at least 40/56 for decreased fall risk.    Time  4    Period  Weeks    Status  New    Target Date  04/16/18      PT LONG TERM GOAL #3   Title  Pt will improve TUG score to less than or equal to 18 seconds for decreased fall risk.    Time  4    Period  Weeks    Target Date  04/16/18      PT LONG TERM GOAL #4   Title  Pt will improve gait velocity to at least 1.8 ft/sec with cane for improved gait efficiency and safety.    Time  4    Period  Weeks    Status  New    Target Date  04/16/18      PT LONG TERM GOAL #5   Title  Pt will verbalize understanding of fall prevention in home environment.    Time  4    Period  Weeks    Status  New    Target Date  04/16/18            Plan - 03/24/18 0853    Clinical Impression Statement  Skilled PT session today focused on initiation of OTAGO HEP to address posture, lower extremity strength and balance.  Pt is limited in performing trunk rotation due to pt's history of back pain limiting this and he has several episodes of feeling R knee will give way.  Educated patient in recommnedation that he continue to use cane at  all times to improve gait safety.  Pt will continue to benefit from skilled PT to address balance, strength and gait.    Rehab Potential  Good    PT Frequency  2x / week    PT Duration  4 weeks plus eval    PT Treatment/Interventions  ADLs/Self  Care Home Management;DME Instruction;Balance training;Therapeutic exercise;Therapeutic activities;Functional mobility training;Gait training;Neuromuscular re-education;Patient/family education    PT Next Visit Plan  Review and add to HEP-OTAGO and walking program    Consulted and Agree with Plan of Care  Patient       Patient will benefit from skilled therapeutic intervention in order to improve the following deficits and impairments:  Abnormal gait, Decreased balance, Decreased mobility, Difficulty walking, Decreased strength  Visit Diagnosis: Muscle weakness (generalized)  Unsteadiness on feet  Other abnormalities of gait and mobility     Problem List Patient Active Problem List   Diagnosis Date Noted  . CKD (chronic kidney disease) stage 3, GFR 30-59 ml/min (HCC) 09/02/2017  . Primary osteoarthritis of right knee 06/22/2017  . S/P lumbar laminectomy 11/22/2014  . 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 Bilateral lower extremity edema 06/14/2014  . Low back pain 03/13/2014  . Bilateral pseudophakia 06/27/2013  . Diabetic neuropathy ([place])   . Skin cancer of arm 04/20/2013  . Type 2 diabetes mellitus with diabetic neuropathy, without long-term current use of insulin ([place]) 03/03/2013  . HTN (hypertension) 03/03/2013  . Hypercholesterolemia     Lewanna Petrak W. 03/24/2018, 8:55 AM  Frazier Butt., PT   [facility] [address] [place], [place], [address] Phone: [phone]   Fax:  [phone]  Name: Christian [name] MRN: [id number] Date of Birth: 03-23-1935

## 2018-03-25 ENCOUNTER — Telehealth: Payer: Self-pay

## 2018-03-25 MED ORDER — INSULIN GLARGINE 100 UNIT/ML SOLOSTAR PEN: 15.0000 [IU] | PEN_INJECTOR | Freq: Every morning | SUBCUTANEOUS | 3 refills | Status: DC

## 2018-03-25 NOTE — Telephone Encounter (Signed)
Lantus --Give injection each morning--administer amount as directed. Currently at 15 units QAM--but up to 50 units QAM Disp # 3 pens + 3 refills

## 2018-03-25 NOTE — Telephone Encounter (Signed)
Received fax from [facility] patient insurance will not cover basaglar they will cover lantus and novolog which would you like to switch to?

## 2018-03-25 NOTE — Telephone Encounter (Signed)
Call placed to patient he is aware of the medication change

## 2018-03-29 ENCOUNTER — Ambulatory Visit: Payer: PPO | Admitting: Physician Assistant

## 2018-03-29 ENCOUNTER — Ambulatory Visit: Payer: PPO | Admitting: Physical Therapy

## 2018-03-29 ENCOUNTER — Encounter: Payer: Self-pay | Admitting: Physical Therapy

## 2018-03-29 DIAGNOSIS — R2681 Unsteadiness on feet: Secondary | ICD-10-CM

## 2018-03-29 DIAGNOSIS — M6281 Muscle weakness (generalized): Secondary | ICD-10-CM

## 2018-03-29 DIAGNOSIS — R2689 Other abnormalities of gait and mobility: Secondary | ICD-10-CM

## 2018-03-29 NOTE — Therapy (Signed)
[facility] [address] [place], [place], [address] Phone: [phone]   Fax:  [phone]  Physical Therapy Treatment  Patient Details  Name: Christian [name] MRN: [id number] Date of Birth: 09/29/1935 Referring Provider: Dena Billet, [place]   Encounter Date: 03/29/2018  PT End of Session - 03/29/18 0932    Visit Number  3    Number of Visits  9    Date for PT Re-Evaluation  06/17/18    Authorization Type  [employer]    PT Start Time  0847    PT Stop Time  0925    PT Time Calculation (min)  38 min    Activity Tolerance  Patient tolerated treatment well No reported increase in pain during session    Behavior During Therapy  [facility] for tasks assessed/performed       Past Medical History:  Diagnosis Date  . Arthritis   . Diabetes mellitus    Type 2  . Diabetic neuropathy ([place])   . History of kidney stones   . HTN (hypertension) 03/03/2013  . Hypercholesterolemia   . 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 Skin cancer of arm 04/20/2013   left upper arm  . Spinal stenosis 05/17/2014   L3-L4   . Urgency of urination     Past Surgical History:  Procedure Laterality Date  . LUMBAR LAMINECTOMY/DECOMPRESSION MICRODISCECTOMY Bilateral 11/22/2014   Procedure: Laminectomy and foraminotomy lumbar three-lumbar four bilateral ;  Surgeon: Eustace Moore, MD;  Location: [facility];  Service: Neurosurgery;  Laterality: Bilateral;  . SKIN CANCER EXCISION Left    shoulder and hand    There were no vitals filed for this visit.  Subjective Assessment - 03/29/18 0851    Subjective  Took a wrong turn getting here today.  Pt able to work on [employer] a couple of times since last visit. R knee is really hurting; got worse since "Thursday when I went out and did some weeding. I believe I'm going to need to hire someone to help with my yard."            Patient Stated Goals  "I don't know, because I get around [name] good."      Currently  in Pain?  Yes    Pain Score  8     Pain Location  Knee    Pain Orientation  Right    Pain Descriptors / Indicators  Aching    Pain Onset  More than a month ago    Pain Frequency  Constant                       [facility] Adult PT Treatment/Exercise - 03/29/18 0001      Ambulation/Gait   Ambulation/Gait  Yes    Ambulation/Gait Assistance  5: Supervision    Ambulation/Gait Assistance Details  Training to increase activity tolaerance; Brought cane today; Left foot caught on ground x2 pt self corrected cues for posture/upright gaze.    Ambulation Distance (Feet)  200 Feet x2    Assistive device  Straight cane    Gait Pattern  Step-through pattern;Trendelenburg;Lateral trunk lean to right;Poor foot clearance - left    Ambulation Surface  Level;Indoor      Exercises   Exercises  Knee/Hip      Knee/Hip Exercises: Stretches   Active Hamstring Stretch  Both;2 reps;30 seconds      Knee/Hip Exercises: Aerobic   Nustep  L1 seat 12, 5 min +  3 min; 467 steps                  PT Long Term Goals - 03/19/18 1029      PT LONG TERM GOAL #1   Title  Pt will be independent with HEP for improved balance, strength, gait.  TARGET 04/16/18    Time  4    Period  Weeks    Status  New    Target Date  04/16/18      PT LONG TERM GOAL #2   Title  Pt will improve Berg Balance score to at least 40/56 for decreased fall risk.    Time  4    Period  Weeks    Status  New    Target Date  04/16/18      PT LONG TERM GOAL #3   Title  Pt will improve TUG score to less than or equal to 18 seconds for decreased fall risk.    Time  4    Period  Weeks    Target Date  04/16/18      PT LONG TERM GOAL #4   Title  Pt will improve gait velocity to at least 1.8 ft/sec with cane for improved gait efficiency and safety.    Time  4    Period  Weeks    Status  New    Target Date  04/16/18      PT LONG TERM GOAL #5   Title  Pt will verbalize understanding of fall prevention in home  environment.    Time  4    Period  Weeks    Status  New    Target Date  04/16/18            Plan - 03/29/18 0928    Clinical Impression Statement  Pt's R knee was aggravated today. Pt thinks it might be from trying to weed last Thursaday.  Strength, hamstring stretching, and activity tolerance training performed on Nustep and gait with cane resulting in pain decreasing from 8/10 to 5/10.  Pt did bring SPC today but continued to occasionally catch L foot during gait; pt demonstrating an antalgic gait pattern.                                          Rehab Potential  Good    PT Frequency  2x / week    PT Duration  4 weeks plus eval    PT Treatment/Interventions  ADLs/Self Care Home Management;DME Instruction;Balance training;Therapeutic exercise;Therapeutic activities;Functional mobility training;Gait training;Neuromuscular re-education;Patient/family education    PT Next Visit Plan  Review and add to HEP-OTAGO and walking program    Consulted and Agree with Plan of Care  Patient       Patient will benefit from skilled therapeutic intervention in order to improve the following deficits and impairments:  Abnormal gait, Decreased balance, Decreased mobility, Difficulty walking, Decreased strength  Visit Diagnosis: Muscle weakness (generalized)  Unsteadiness on feet  Other abnormalities of gait and mobility     Problem List Patient Active Problem List   Diagnosis Date Noted  . CKD (chronic kidney disease) stage 3, GFR 30-59 ml/min (HCC) 09/02/2017  . Primary osteoarthritis of right knee 06/22/2017  . S/P lumbar laminectomy 11/22/2014  . 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 Bilateral lower extremity edema 06/14/2014  . Low back pain 03/13/2014  .  Bilateral pseudophakia 06/27/2013  . Diabetic neuropathy ([place])   . Skin cancer of arm 04/20/2013  . HTN (hypertension) 03/03/2013  . Hypercholesterolemia    Bjorn Loser, PTA  03/29/18, 1:39  PM [facility] [address] [place], [place], [address] Phone: [phone]   Fax:  [phone]  Name: Christian [name] MRN: [id number] Date of Birth: August 26, 1935

## 2018-03-31 ENCOUNTER — Encounter: Payer: Self-pay | Admitting: Physician Assistant

## 2018-03-31 ENCOUNTER — Ambulatory Visit (INDEPENDENT_AMBULATORY_CARE_PROVIDER_SITE_OTHER): Payer: PPO | Admitting: Physician Assistant

## 2018-03-31 DIAGNOSIS — E118 Type 2 diabetes mellitus with unspecified complications: Secondary | ICD-10-CM | POA: Diagnosis not present

## 2018-03-31 DIAGNOSIS — Z794 Long term (current) use of insulin: Secondary | ICD-10-CM | POA: Diagnosis not present

## 2018-03-31 NOTE — Progress Notes (Signed)
Patient ID: Christian [name] MRN: [id number], DOB: 07/01/1935, 83 y.o. Date of Encounter: @DATE @  Chief Complaint:  Chief Complaint  Patient presents with  . follow up with diabetes    HPI: 83 y.o. year old male  presents for routine 3 months f/u OV to f/u DM, HTN, and other chronic medical problems.   Hypertension: 12/03/2017: Taking meds as directed. No adverse effects.  Hyperlipidemia: 12/03/2017: On Zocor 40mg  and on Niaspan. No myalgias or other adverse medication effects.  Diabetes: In the past we had difficulty getting his diabetes controlled and he was very noncompliant with diet. All of this information is detailed and documented in my note 05/21/16. See that note for details of all of the past information. 03/2014 he was having lower extremity edema so Actos was stopped and was changed to [place]. Also --have added Invokana, but unable to afford- At the time of his OV 09/02/2017---He was on:  metformin, Glucotrol, Januvia. Labs done 09/02/17 showed elevated creatinine.  At that time I said to stop metformin and to decrease Januvia to 50 mg. Today--12/03/2017--- he states that when he stopped the metformin and decreased the Januvia, that his blood sugar went up really high and his vision got blurry.   He says that that scared him so he went back on the metformin and went back on the Januvia 100 mg.  At [place] 12/03/2017: Have told him to stop the metformin and decrease the Januvia to 50 mg.  Will continue the Glucotrol XL 10 mg daily.   To try to keep his blood sugar controlled with having to stop the metformin and decrease the Januvia,  I am adding Jardiance 25 mg every morning. I will have him come in for follow-up visit on Monday to make sure he has been able to make these medicine changes and to make sure that his blood sugar is controlled.   12/07/2017: Today he reports that he has been taking medications as directed at his recent visit 12/03/17. He did stop the metformin  and he did decrease the Januvia down to 50 mg. He did continue the Glucotrol 10 mg daily. He did add the Jardiance 25 mg daily. I asked if he had some blood sugar readings for me to review.  He says " [name], I forgot to even check any blood sugars--- I was so busy adjusting these medicines." Says that the Jardiance cost $45 for him.  Says the Januvia cost him $35. He states that he is feeling fine after making these medicine adjustments.  He is having no adverse effects.       ------------------------------------------------------------------------------------------------------------------------------------------------------------------------------------------------------------------------------------------------------------------------  03/15/2018:  Today I reviewed his chart.   --Reviewed that his last routine 54-month office visit with me was 12/07/2017 which is documented above. --I reviewed that since that visit he had 2 visits with me regarding a fall at home.  The following information is copied from those visits.    02/18/2018: His daughter accompanies him for visit today. Had reviewed that his last routine visit with me was at the beginning of March and that at that visit things were fairly stable.  We had just recently adjusted some of his diabetic medications secondary to CKD.  Otherwise things were stable and from a symptomatic standpoint patient had been stable and had not been reporting any concerns or symptoms.  However, today this daughter who accompanies him today states that she has been noticing a decline with him for months.  Has noticed him  having more problems with his short-term memory.  Says that she knows that he comes in here for his visits and does not tell me that he is having any issues.  Says that he tries to appear as if things are fine and that he is handling everything fine but she does not think he is.  She states that she lives about 15 to 20 minutes from  him.  Says that on average she usually sees him 1-2 times per week but she had not seen him for 2 weeks up until yesterday-- prior to yesterday had not seen him for 2 weeks.  However says that she does talk to him on the phone more often.  States that he also has 1 son that lives in [place]. States that he has another son that lives in [place].  Has these 3 children.  She reports that all 3 of them work full-time. She works 4:30 am - noon.  We discussed that she would be available in the afternoons to come with him to follow-up office visits with me.  She reports that they have tried to bring up the issue that he seems to need some assistance but that he seems to "not want to hear it ".  She says that she "does not know whether he is just too proud or just too independent."  They report that he fell yesterday morning and was on the floor for an extended amount of time.  Patient states that he did not have a phone and that he had fallen and was on the floor and he tried to crawl but could not because his leg hurt.  Says that he finally got himself to a recliner/chair that he was able to hold to and try to walk but he held to the chair and collapsed again.  Somehow he did eventually get to a phone and called his daughter.  Says that all she heard on the phone was him saying "help me, help me".  She took him to the ER and I do have chest x-ray report and lab report that was done there but then he left the ER before evaluation was complete.  Today I reviewed with patient and daughter that significant findings from the labs and x-ray from the ER show: Troponin negative. Chest x-ray showed no acute abnormality Lab was significant for elevated white count Lab also significant for significant elevation of BUN and mild elevation of creatinine consistent with dehydration.  Discussed with patient and daughter that ideally he does need to be completely evaluated and completely treated at the  emergency room/hospital.  However he refuses to go back to the hospital. Discussed with patient and daughter that my plan would be to give him IV fluids here and to evaluate because of infection/elevated white count.  They both report that he has had significant congested cough for about 2 weeks.  Daughter states that even with talking to him on the telephone that she has been able to tell that he has had a "bad cough "for about 2 weeks.  Denies any other signs or symptoms of infection.  Has had no dysuria.  No pain in his suprapubic/pelvic region.  No abdominal pain.  No vomiting or diarrhea.  He has not been having any fevers or chills.    A/P AT THAT OV----  Discussed with patient and daughter that ideally he does need to be completely evaluated and completely treated at the emergency room/hospital.  However he refuses to  go back to the hospital. Discussed with patient and daughter that my plan would be to give him IV fluids here and to evaluate cause of infection/elevated white count.  They both report that he has had significant congested cough for about 2 weeks.  Daughter states that even with talking to him on the telephone that she has been able to tell that he has had a "bad cough "for about 2 weeks.  Denies any other signs or symptoms of infection.  Has had no dysuria.  No pain in his suprapubic/pelvic region.  No abdominal pain.  No vomiting or diarrhea.  He has not been having any fevers or chills.  Discussed whether his leg still has pain whether he is having any area of pain that needs x-ray to evaluate for fracture.  He reports that there is no area of significant pain at this point and refuses need for x-ray.  UA performed today shows no UTI.  He is treated here today with 2 bags of IV Fluids.  He is given Rx for Levaquin 55m QD x 7 days.  He has scheduled f/u OV with me on Monday afternoon--this daughter is available to come with him to that OV.  He is to go to ER if  symptoms worsen in interim. Pt and daughter voice understanding, agree.    02/22/2018: Today pt looks much better. Says he feels a lot better too.  Reports he has been taking Levaquin as directed. Says cough is better already.  Says he has been up, walking around house some.  I discussed with pt and daughter--- whether they feel he is safe at home, can care for himself. Discussed cost of care at home, assisted living, nursing home etc. They are to discuss as a family regarding finances etc. I discussed that insurance should cover Physical Therapy and that could improve his strength and balance etc. He is agreeable to proceed with PT.  Daughter requests medication for him to use for allergy symptoms that is safe to use with his other meds--says he has had some sneezing, rhinorrhea. AT THAT OV: Placed order for physical therapy Obtained follow-up CBC and BMET. Also daughter plan to get life alert for patient to have available to call if needs help.    03/15/2018: Today I asked patient about physical therapy.  He states that he never heard anything from them.  He states that he does not have any type of voicemail or answering machine. He states that he has had no further falls or near falls. He does now have a life alert and is wearing this today. While he was here in the office during his visit I spoke to referral staff and they got his appointment scheduled with physical therapy and told him date and time for him to go for physical therapy as I was concerned he would not get that via phone message.  That is scheduled during today's visit. Today he reports that he did go to the kidney doctor.  He has paperwork and [facility] The paperwork indicates that they were ordering labs and kidney ultrasound.  States that he does have follow-up appointment scheduled to go back to them for follow-up.  He has no other updates today.  Has no specific concerns to  address today.  Today he reports that he has been taking medications as directed.  He is taking the Glucotrol 10 mg daily.  He did not bring in any blood sugar  readings for me to review. He is taking the simvastatin and Niaspan.  Also still taking the gabapentin for neuropathy.     Addendum added 03/18/2018: Received note from [place].  Date of encounter 03/09/2018.  Dr. Terri Skains. His note includes the following: "HPI: This patient is an 83 year old male who presents with chronic kidney disease.  This is classified as stage 34. 83 year old white male with history of type 2 diabetes for > 35 years (A1c 7.4, as high as 8.9), hypertension, hyperlipidemia, neuropathy, nephrolithiasis,  referred by PCP for evaluation of elevated serum creatinine level and CKD management.   Patient had serum creatinine level of 2.32 on 02/22/2018, 2.5 on 02/17/2018, 1.6---11/2017, 1.36 --- 04/2015. He uses Aleve occasionally for pain management. Denies urinary symptoms. No lower extremity edema. No history of hepatitis or HIV.  No tobacco or excessive alcohol use.  No family history of kidney disease. He is on lisinopril.  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   Serum creatinine level gradually increasing at least since 2016 with last serum creatinine level 2.32 on 02/2018.   Check renal panel, urinalysis, spot urine PCR, hepatitis B, C, and HIV serology.   Plan to check CKD labs including anemia and bone disease as below.   US renal to evaluate kidney size and echogenicity.   Advised to avoid NSAIDs and avoid nephrotoxins.   I discussed the stages of kidney disease and the importance of better control of DM, HTN, and avoiding nephrotoxins in order to delay progression of CKD.   On KCl oral, we will follow-up today's lab." Hypertension, renal disease: Today's impression: BP  suboptimal.  On lisinopril.  Advised low-salt diet and monitor BP.  Addendum note was added by Dr. Junie Panning on 03/15/18 "Kidney function is better with creatinine 1.31.   Found to have low iron and hemoglobin 10.6.  Please call in for oral iron 325 mg twice daily. Iron sat 16, serum iron 43 PTH 14, vitamin D 29.5 "   03/24/2018: At visit 03/15/2018 labs came back showing A1c 9.5. At that time had him schedule another OV with me for a 30-minute slot to discuss starting basal insulin. Returns for that visit today. So today I did review that he did have OV with [facility] and reviewed above addendum with their information. Also today I did review that he has been having visits with physical therapy. He has no concerns to address today.  Is here to discuss insulin. 30 minutes was spent in face-to-face interaction with him discussing: --- Stop glipizide/Glucotrol. --- Check fasting blood sugar every morning and document on the log form that I have given him today. --- Give insulin 15 units each morning. --- Scheduled follow-up visit with me on Monday at 11:00 AM.  He is to bring that blood sugar log form with him to that visit. Today I have given him a sample of Basaglar. I have explained that his insurance may cover a different brand name of insulin but that will be an equivalent. Have shown him how to use the insulin pen and how to dial to the 15 units. Written all information down on paper for him to have to refer to at home. Have answered all questions and he voices understanding.   03/31/2018: Today he reports that he did stop the Glucotrol/glipizide. Today he reports that he has been given the insulin at 15 units each  morning. Today he does bring in his blood sugar log form. He has been getting the following fasting morning readings: 349, 364, 372, 292, 327, 333, 323  He has no other concerns to address today.     Past Medical History:  Diagnosis Date  . Arthritis    . Diabetes mellitus    Type 2  . Diabetic neuropathy ([place])   . History of kidney stones   . HTN (hypertension) 03/03/2013  . Hypercholesterolemia   . 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 Skin cancer of arm 04/20/2013   left upper arm  . Spinal stenosis 05/17/2014   L3-L4   . Urgency of urination      Home Meds: Outpatient Medications Prior to Visit  Medication Sig Dispense Refill  . acetaminophen (TYLENOL) 500 MG tablet Take 1,000 mg by mouth every 8 (eight) hours as needed for mild pain or moderate pain (back pain).    Marland Kitchen aspirin EC 81 MG tablet Take 81 mg by mouth daily.      . Cetirizine HCl (ZYRTEC ALLERGY) 10 MG CAPS Take once daily as needed for allergy symptoms. (May cause drowsiness) 30 capsule 5  . cholecalciferol (VITAMIN D) 1000 UNITS tablet Take 1,000 Units by mouth daily.      . Cyanocobalamin (B-12 PO) Take 1 tablet by mouth daily.    . fish oil-omega-3 fatty acids 1000 MG capsule Take 3 g by mouth 2 (two) times daily.      Marland Kitchen gabapentin (NEURONTIN) 600 MG tablet TAKE 1 TABLET BY MOUTH THREE TIMES DAILY 90 tablet 3  . HYDROcodone-acetaminophen (NORCO) 7.5-325 MG tablet Take 1 tablet by mouth every 6 (six) hours as needed for moderate pain. 30 tablet 0  . Insulin Glargine (LANTUS SOLOSTAR) 100 UNIT/ML Solostar Pen Inject 15 Units into the skin every morning. Start at 15 units each morning-up to 50 units in the daily in the  morning 3 pen 3  . lisinopril (PRINIVIL,ZESTRIL) 10 MG tablet Take 1 tablet (10 mg total) by mouth daily. 90 tablet 0  . Multiple Vitamin (MULITIVITAMIN WITH MINERALS) TABS Take 1 tablet by mouth daily.      . niacin (NIASPAN) 500 MG CR tablet TAKE 1 TABLET BY MOUTH AT BEDTIME 90 tablet 0  . ONE TOUCH ULTRA TEST test strip USE TO CHECK FASTING BLOOD SUGARS THREE TIMES DAILY 100 each 11  . potassium gluconate 595 MG TABS tablet Take 595 mg by mouth daily.    . simvastatin (ZOCOR) 40 MG tablet TAKE 1 TABLET BY MOUTH DAILY WITH  BREAKFAST 90 tablet 0    No facility-administered medications prior to visit.     Allergies:  Allergies  Allergen Reactions  . Actos [Pioglitazone]     LE Edema--03/2014    Social History   Socioeconomic History  . Marital status: Divorced    Spouse name: Not on file  . Number of children: Not on file  . Years of education: Not on file  . Highest education level: Not on file  Occupational History  . Not on file  Social Needs  . Financial resource strain: Not on file  . Food insecurity:    Worry: Not on file    Inability: Not on file  . Transportation needs:    Medical: Not on file    Non-medical: Not on file  Tobacco Use  . Smoking status: Former Smoker    Last attempt to quit: 03/04/2003    Years since quitting: 15.0  . Smokeless tobacco: Never Used  Substance and Sexual Activity  . Alcohol use: No  . Drug use: No  . Sexual activity: Not on file  Lifestyle  . Physical activity:    Days per week: Not on file    Minutes per session: Not on file  . Stress: Not on file  Relationships  . Social connections:    Talks on phone: Not on file    Gets together: Not on file    Attends religious service: Not on file    Active member of club or organization: Not on file    Attends meetings of clubs or organizations: Not on file    Relationship status: Not on file  . Intimate partner violence:    Fear of current or ex partner: Not on file    Emotionally abused: Not on file    Physically abused: Not on file    Forced sexual activity: Not on file  Other Topics Concern  . Not on file  Social History Narrative  . Not on file    History reviewed. No pertinent family history.   Review of Systems:  See HPI for pertinent ROS. All other ROS negative.     Physical Exam: Blood pressure 122/62, pulse 77, temperature (!) 97.4 F (36.3 C), temperature source Oral, resp. rate 20, height 5\' 8"  (1.727 m), weight 90.8 kg (200 lb 3.2 oz), SpO2 96 %., Body mass index is 30.44 kg/m. General: Appears in  no acute distress. Neck: Supple. No thyromegaly. No lymphadenopathy. Lungs: Clear bilaterally to auscultation without wheezes, rales, or rhonchi. Breathing is unlabored. Heart: RRR with S1 S2. No murmurs, rubs, or gallops. Musculoskeletal:  Strength and tone normal for age. Extremities/Skin: Warm and dry.  Neuro: Alert and oriented X 3. Moves all extremities spontaneously. Gait is normal. CNII-XII grossly in tact. Psych:  Responds to questions appropriately with a normal affect.    Diabetic foot exam: Inspection: On the dorsal aspect of his feet he has a lot of scaly skin. No open wounds or sores. Sensation is intact. Plantar surface of his feet look good. No calluses or wounds. Trace left DP pulse. Trace right DP pulse. No palpable posterior tibial pulses bilaterally.      ASSESSMENT AND PLAN:  83 y.o. year old male with    1. Type 2 diabetes mellitus with complication, with long-term current use of insulin ([place])  2. Diabetic polyneuropathy associated with other specified diabetes mellitus ([place])  3. CKD (chronic kidney disease) stage 3, GFR 30-59 ml/min ([facility])  03/24/2018: 30 minutes was spent in face-to-face interaction with him  At visit 03/15/2018 labs came back showing A1c 9.5. At that time had him schedule another OV with me for a 30-minute slot to discuss starting basal insulin. Returns for that visit today. So today I did review that he did have OV with [facility] and reviewed above addendum with their information. Also today I did review that he has been having visits with physical therapy. He has no concerns to address today.  Is here to discuss insulin. 30 minutes was spent in face-to-face interaction with him discussing: --- Stop glipizide/Glucotrol. --- Check fasting blood sugar every morning and document on the log form that I have given him today. --- Give insulin 15 units each morning. --- Scheduled follow-up visit with me on Monday at 11:00 AM.  He is  to bring that blood sugar log form with him to that visit. Today I have given him a sample of Basaglar. I have explained that  his insurance may cover a different brand name of insulin but that will be an equivalent. Have shown him how to use the insulin pen and how to dial to the 15 units. Written all information down on paper for him to have to refer to at home. Have answered all questions and he voices understanding. Monday he has an appointment with physical therapy at 8:45.  He will go to that and then come here for visit at 11:00.  He will follow-up sooner if needed.   03/31/2018: Today I have written down the following instructions regarding titrating up his insulin. Will increase by 5 units tomorrow morning and then will increase by 2 units/day after that. He will return for follow-up visit with me on Monday.  He has physical therapy that morning at 8 AM and is going to see me at 10 AM. He is to bring this blood sugar log form with him to that visit. Follow-up sooner if any concerns.   Thursday, 03/25/2018-----15 units------ 349 Friday, 03/26/2018----------15 units------364 Saturday, 03/27/2018----- 15 units-------372 Sunday, 03/28/2018--------15 units--------292 Monday, 03/29/2018-------- 15 units------ 327 Tuesday 03/30/2018--------15 units------- 333 Wednesday, 03/31/2018--- 15 units------ 323 Thursday, 04/01/2018--------20 units Friday, 04/02/2018-----------22 units Saturday 04/03/2018--------24 units Sunday, 04/04/2018----------26 units Monday, 04/05/2018      THE FOLLOWING IS PULLED FORWARD FROM OV NOTE 03/15/2018---NOT ADDRESSED AT OV 03/24/2018, 03/31/2018:  12/07/2017:For him the Vania Rea is costing $45.  Celesta Gentile is costing $35. Today I checked for samples but we only have one bottle remaining of samples for the Jardiance and I have given him that.  We had no samples of the Januvia. I had discussion with him explaining that I do not have any cheaper options that we can use at this  point. Discussed that our options are limited in the future if his A1c is elevated only things to consider adding would be once weekly Trulicity or another med from that class or adding Lantus. He is to check fasting blood sugars and call me with these after checking for several days. Also, I told him that in general even down the road that if he thinks he is having uncontrolled blood sugars or thinks he is having any side effects from meds etc. to call and inform me rather than adjusting medicines on his own.  He voices understanding and agrees.  This point will plan for follow-up visit in 3 months but to follow-up sooner if needed and call with blood sugar readings after med changes.  Also in the past he has not been able to afford Invokana. Also in the past has history of lower extremity edema with Actos.  03/15/2018: Secondary to CKD and cost metformin has been discontinued and also Jardiance and Januvia.  Now is currently just on Glucotrol 10 mg.  Will check A1c and then follow-up accordingly.  He does have annual eye exam.  On ASA 81mg  QD On statin. On ACE inhibitor.  Has had Pneumovax 23 in past. Had Prevnar 13-- 12/2013.  Diabetic mononeuropathy associated with diabetes mellitus due to underlying condition ([place]) 03/15/2018---Stable/controlled with current dose of Neurontin  CKD (chronic kidney disease) stage 3, GFR 30-59 ml/min ([facility]) 03/15/2018: Some diabetic medications have been discontinued secondary to CKD.  Also he is now established with Dr. Carolin Sicks at [facility].  He is now managing CKD.  Addendum added 03/18/2018: Received note from [place].  Date of encounter 03/09/2018.  Dr. Terri Skains. His note includes the following: "HPI: This patient is an 83 year old male who presents with  chronic kidney disease.  This is classified as stage 97. 83 year old white male with history of type 2 diabetes for > 35 years (A1c 7.4, as high as 8.9),  hypertension, hyperlipidemia, neuropathy, nephrolithiasis,  referred by PCP for evaluation of elevated serum creatinine level and CKD management.   Patient had serum creatinine level of 2.32 on 02/22/2018, 2.5 on 02/17/2018, 1.6---11/2017, 1.36 --- 04/2015. He uses Aleve occasionally for pain management. Denies urinary symptoms. No lower extremity edema. No history of hepatitis or HIV.  No tobacco or excessive alcohol use.  No family history of kidney disease. He is on lisinopril.  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   Serum creatinine level gradually increasing at least since 2016 with last serum creatinine level 2.32 on 02/2018.   Check renal panel, urinalysis, spot urine PCR, hepatitis B, C, and HIV serology.   Plan to check CKD labs including anemia and bone disease as below.   US renal to evaluate kidney size and echogenicity.   Advised to avoid NSAIDs and avoid nephrotoxins.   I discussed the stages of kidney disease and the importance of better control of DM, HTN, and avoiding nephrotoxins in order to delay progression of CKD.   On KCl oral, we will follow-up today's lab." Hypertension, renal disease: Today's impression: BP suboptimal.  On lisinopril.  Advised low-salt diet and monitor BP.  Addendum note was added by Dr. Junie Panning on 03/15/18 "Kidney function is better with creatinine 1.31.   Found to have low iron and hemoglobin 10.6.  Please call in for oral iron 325 mg twice daily. Iron sat 16, serum iron 43 PTH 14, vitamin D 29.5 "    Essential hypertension 03/15/2018:  Blood pressure is at goal/controlled. He is on ACE inhibitor.Check labs to monitor.   Hypercholesterolemia 03/15/2018---He is on Statin and Niaspan. Last FLP at goal. LFTs normal. Cont current meds.  Bilateral lower extremity edema 03/15/2018---He uses HCTZ 12.5mg  PRN swelling--no longer having to  use daily.    screening PSA: Normal 07/2011. 05/27/2017---Will not repeat given advanced age   screening colonoscopy: At recent office visits we have discussed that this was due.  At visit 06/2014 he says that he needs to get his back pain control prior to scheduling this. Given age 43, can probably stop these--will discuss with pt Discussed colonoscpoy with patient at visit 04/12/15. He does seem interested in wanting to have continued follow-up colonoscopies. However at today's visit he just says it to much is going on and for [place] to rediscuss it at his next visit once things settle down. Discussed visit 05/21/16 and he is agreeable for me to place order for referral to GI for follow-up colonoscopy.---Ordered referrral to GI -- 05/21/2016 08/21/2016---Has colonoscopy scheduled this upcoming week.  11/26/2016--He reports that Dr. Kalman Shan gave him hemeOccults cards to turn in --did not have to do colonoscopy  Immunizations: Pneumonia vaccine: Received Pneumovax here 05/01/2008. Updated with Prevnar 13 12/2013.Received 06/23/2016 Tetanus: Given here 07/23/2011 Zostavax: He checked with his insurance-- cost would be $200 patient defers. Shingrix---05/27/2017----currently on back-order---will discuss in future--when available Influenza Vaccine: Given here 08/06/2015      Signed, Karis Juba, [place], [facility] 03/31/2018 12:04 PM

## 2018-04-02 ENCOUNTER — Encounter: Payer: Self-pay | Admitting: Physical Therapy

## 2018-04-02 ENCOUNTER — Ambulatory Visit: Payer: PPO | Admitting: Physical Therapy

## 2018-04-02 DIAGNOSIS — M6281 Muscle weakness (generalized): Secondary | ICD-10-CM

## 2018-04-02 DIAGNOSIS — R2681 Unsteadiness on feet: Secondary | ICD-10-CM

## 2018-04-03 NOTE — Therapy (Signed)
[facility] [address] [place], [place], [address] Phone: [phone]   Fax:  [phone]  Physical Therapy Treatment  Patient Details  Name: Christian [name] MRN: [id number] Date of Birth: 1935/02/25 Referring Provider: Dena Billet, [place]   Encounter Date: 04/02/2018  PT End of Session - 04/03/18 1059    Visit Number  4    Number of Visits  9    Date for PT Re-Evaluation  06/17/18    Authorization Type  [employer]    PT Start Time  0936    PT Stop Time  1015    PT Time Calculation (min)  39 min    Activity Tolerance  Patient tolerated treatment well No reported increase in pain during session    Behavior During Therapy  [facility] for tasks assessed/performed       Past Medical History:  Diagnosis Date  . Arthritis   . Diabetes mellitus    Type 2  . Diabetic neuropathy ([place])   . History of kidney stones   . HTN (hypertension) 03/03/2013  . Hypercholesterolemia   . 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 Skin cancer of arm 04/20/2013   left upper arm  . Spinal stenosis 05/17/2014   L3-L4   . Urgency of urination     Past Surgical History:  Procedure Laterality Date  . LUMBAR LAMINECTOMY/DECOMPRESSION MICRODISCECTOMY Bilateral 11/22/2014   Procedure: Laminectomy and foraminotomy lumbar three-lumbar four bilateral ;  Surgeon: Eustace Moore, MD;  Location: [facility];  Service: Neurosurgery;  Laterality: Bilateral;  . SKIN CANCER EXCISION Left    shoulder and hand    There were no vitals filed for this visit.  Subjective Assessment - 04/02/18 0941    Subjective  Haven't really done my exercises this week; I've been too busy with other things.  No falls.    Patient Stated Goals  "I don't know, because I get around pretty good."      Currently in Pain?  Yes    Pain Score  5     Pain Location  Knee    Pain Orientation  Right    Pain Descriptors / Indicators  Shooting    Pain Type  Chronic pain    Pain Onset   More than a month ago    Pain Frequency  Constant    Aggravating Factors   walking    Pain Relieving Factors  sitting down alleviates pain                       [facility] Adult PT Treatment/Exercise - 04/03/18 1058      Knee/Hip Exercises: Aerobic   Nustep  L2 seat 12, 6 min;  465 steps for flexibility, strengthening lower extremities          Balance Exercises - 04/02/18 0944      OTAGO PROGRAM   Head Movements  Sitting;5 reps    Neck Movements  Sitting;5 reps    Back Extension  5 reps;Sitting    Ankle Movements  Sitting;10 reps    Knee Extensor  10 reps    Knee Flexor  10 reps    Hip ABductor  20 reps alternating legs    Ankle Plantorflexors  20 reps, support    Ankle Dorsiflexors  20 reps, support    Knee Bends  10 reps, support    Backwards Walking  Support forward/back 2 reps-has to stop-feels R knee may buckle    Sideways  Walking  Assistive device    Tandem Stance  10 seconds, support    One Leg Stand  10 seconds, support    Sit to Stand  10 reps, bilateral support    Overall OTAGO Comments  Updated OTAGO to inlcuded remaining exercises as above, for pt's HEP.  Reminded pt to perform 3 times per week.        PT Education - 04/03/18 1058    Education Details  Updated HEP-see OTAGO additions in flow sheet    Person(s) Educated  Patient    Methods  Explanation;Demonstration;Handout    Comprehension  Verbalized understanding;Returned demonstration          PT Long Term Goals - 03/19/18 1029      PT LONG TERM GOAL #1   Title  Pt will be independent with HEP for improved balance, strength, gait.  TARGET 04/16/18    Time  4    Period  Weeks    Status  New    Target Date  04/16/18      PT LONG TERM GOAL #2   Title  Pt will improve Berg Balance score to at least 40/56 for decreased fall risk.    Time  4    Period  Weeks    Status  New    Target Date  04/16/18      PT LONG TERM GOAL #3   Title  Pt will improve TUG score to less than or equal  to 18 seconds for decreased fall risk.    Time  4    Period  Weeks    Target Date  04/16/18      PT LONG TERM GOAL #4   Title  Pt will improve gait velocity to at least 1.8 ft/sec with cane for improved gait efficiency and safety.    Time  4    Period  Weeks    Status  New    Target Date  04/16/18      PT LONG TERM GOAL #5   Title  Pt will verbalize understanding of fall prevention in home environment.    Time  4    Period  Weeks    Status  New    Target Date  04/16/18            Plan - 04/03/18 1059    Clinical Impression Statement  Skilled PT session today focused on review of OTAGO exercises and updates to [facility] program, with pt receiving completed (appropriate) exercises as part of HEP.  Discussed how pt could fit these exercises into his routine, as he has not been consistently performing.  Will continue to beneift from skilled PT to address stregnth, balance, and gait.    Rehab Potential  Good    PT Frequency  2x / week    PT Duration  4 weeks plus eval    PT Treatment/Interventions  ADLs/Self Care Home Management;DME Instruction;Balance training;Therapeutic exercise;Therapeutic activities;Functional mobility training;Gait training;Neuromuscular re-education;Patient/family education    PT Next Visit Plan  Review new additions to OTAGO, discuss walking program; additional balance exercises as appropriate.    Consulted and Agree with Plan of Care  Patient       Patient will benefit from skilled therapeutic intervention in order to improve the following deficits and impairments:  Abnormal gait, Decreased balance, Decreased mobility, Difficulty walking, Decreased strength  Visit Diagnosis: Unsteadiness on feet  Muscle weakness (generalized)     Problem List Patient Active Problem List   Diagnosis Date  Noted  . Diabetes mellitus type 2 with complications ([place]) 41/58/3094  . CKD (chronic kidney disease) stage 3, GFR 30-59 ml/min (HCC) 09/02/2017  . Primary  osteoarthritis of right knee 06/22/2017  . S/P lumbar laminectomy 11/22/2014  . 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 Bilateral lower extremity edema 06/14/2014  . Low back pain 03/13/2014  . Bilateral pseudophakia 06/27/2013  . Diabetic neuropathy ([place])   . Skin cancer of arm 04/20/2013  . HTN (hypertension) 03/03/2013  . Hypercholesterolemia     Naara Kelty W. 04/03/2018, 11:01 AM  Frazier Butt., PT  [facility] [address] [place], [place], [address] Phone: [phone]   Fax:  [phone]  Name: Christian [name] MRN: [id number] Date of Birth: 01/24/1935

## 2018-04-05 ENCOUNTER — Ambulatory Visit (INDEPENDENT_AMBULATORY_CARE_PROVIDER_SITE_OTHER): Payer: PPO | Admitting: Physician Assistant

## 2018-04-05 ENCOUNTER — Encounter: Payer: Self-pay | Admitting: Physician Assistant

## 2018-04-05 ENCOUNTER — Encounter: Payer: Self-pay | Admitting: Physical Therapy

## 2018-04-05 ENCOUNTER — Ambulatory Visit: Payer: PPO | Attending: Physician Assistant | Admitting: Physical Therapy

## 2018-04-05 VITALS — BP 134/68 | HR 77 | Temp 97.4°F | Resp 18 | Ht 68.0 in | Wt 199.8 lb

## 2018-04-05 DIAGNOSIS — R2689 Other abnormalities of gait and mobility: Secondary | ICD-10-CM | POA: Insufficient documentation

## 2018-04-05 DIAGNOSIS — Z794 Long term (current) use of insulin: Secondary | ICD-10-CM

## 2018-04-05 DIAGNOSIS — R2681 Unsteadiness on feet: Secondary | ICD-10-CM | POA: Diagnosis not present

## 2018-04-05 DIAGNOSIS — M6281 Muscle weakness (generalized): Secondary | ICD-10-CM | POA: Diagnosis not present

## 2018-04-05 DIAGNOSIS — E118 Type 2 diabetes mellitus with unspecified complications: Secondary | ICD-10-CM

## 2018-04-05 NOTE — Patient Instructions (Signed)
For Dena Billet, PA: (for visit 04/05/18)  Mr. Weedon is at fall risk per balance scores at evaluation-Berg score 32/56 (scores <45/56 indicate increased fall risk); TUG score 24.65 sec (>13.5 seconds indicates increased fall risk); gait velocity with cane (<1.8 ft/sec indicates increased fall risk)  -He is unsteady with walking with his cane, and appears to have improved posture, steadiness with gait and decreased reports of pain when using rollator  -Could you please write order for rollator walker, in order for patient to obtain?  Thank you,  Mady Haagensen, PT 04/05/18 8:44 AM Phone: [phone] Fax: [phone]

## 2018-04-05 NOTE — Therapy (Signed)
[facility] [address] [place], [place], [address] Phone: [phone]   Fax:  [phone]  Physical Therapy Treatment  Patient Details  Name: Christian [name] MRN: [id number] Date of Birth: 11-24-1934 Referring Provider: Dena Billet, [place]   Encounter Date: 04/05/2018  PT End of Session - 04/05/18 1113    Visit Number  5    Number of Visits  9    Date for PT Re-Evaluation  06/17/18    Authorization Type  [employer]    PT Start Time  0804    PT Stop Time  0846    PT Time Calculation (min)  42 min    Activity Tolerance  Patient tolerated treatment well Fluctuating pain in R knee throughout session; increase in back pain with gait with cane to 4/10; back pain resolves with rollator gait    Behavior During Therapy  [facility] for tasks assessed/performed       Past Medical History:  Diagnosis Date  . Arthritis   . Diabetes mellitus    Type 2  . Diabetic neuropathy ([place])   . History of kidney stones   . HTN (hypertension) 03/03/2013  . Hypercholesterolemia   . 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 Skin cancer of arm 04/20/2013   left upper arm  . Spinal stenosis 05/17/2014   L3-L4   . Urgency of urination     Past Surgical History:  Procedure Laterality Date  . LUMBAR LAMINECTOMY/DECOMPRESSION MICRODISCECTOMY Bilateral 11/22/2014   Procedure: Laminectomy and foraminotomy lumbar three-lumbar four bilateral ;  Surgeon: Eustace Moore, MD;  Location: [facility];  Service: Neurosurgery;  Laterality: Bilateral;  . SKIN CANCER EXCISION Left    shoulder and hand    There were no vitals filed for this visit.  Subjective Assessment - 04/05/18 0806    Subjective  Did my exercises (most of them) yesterday.    Patient Stated Goals  "I don't know, because I get around pretty good."      Currently in Pain?  Yes    Pain Score  4     Pain Location  Knee    Pain Orientation  Right    Pain Descriptors / Indicators  Shooting     Pain Type  Chronic pain    Pain Onset  More than a month ago    Pain Frequency  Constant    Aggravating Factors   walking    Pain Relieving Factors  sitting down alleviates pain                       [facility] Adult PT Treatment/Exercise - 04/05/18 0001      Ambulation/Gait   Ambulation/Gait  Yes    Ambulation/Gait Assistance  4: Min guard    Ambulation/Gait Assistance Details  Gait training with cane-educated on technique with LUE and RLE; however, pt prefers RUE and RLE cane at same time due to instability of R knee    Ambulation Distance (Feet)  500 Feet 400 ft with rollator    Assistive device  Straight cane    Gait Pattern  Step-through pattern;Trendelenburg;Lateral trunk lean to right;Poor foot clearance - left    Ambulation Surface  Level;Indoor    Pre-Gait Activities  Discussed possibility of walking program at home, as pt amublated just over 5 minutes using cane today.  However, pt's gait with cane appears unsteady/antalgic, due to pt's R knee pain, and back pain which comes on after about 400 ft  of gait.  Discussed pt's f/u with physician specifically regarding knee pain (as knee pain/instability at times is limiting safe mobility).  Discussed use of rollator for improved steadiness and improved posture with gait.  Pt in agreement to ask physician for script for order for rollator walker.    Gait Comments  With gait with rollator, pt requires cues for upright posture and foot clearance/heelstrike, as his tendency is to keep rollator too far in front.  He experiences at least 3 episodes of foot catching with gait with rollator.      Knee/Hip Exercises: Aerobic   Nustep  Level 2, Seat 11, 5 minutes, 385 steps at beginning of session for flexibility, strengthening of lower extremities          Balance Exercises - 04/05/18 0818      OTAGO PROGRAM   Sideways Walking  Assistive device At counter, 2 reps    Tandem Stance  10 seconds, support    One Leg Stand  10  seconds, support    Sit to Stand  10 reps, bilateral support    Overall OTAGO Comments  Review of new OTAGO program, with return demo understanding        PT Education - 04/05/18 1112    Education Details  Recommendation for rollator walker-note to MD (as pt is going to MD appt at 10 am) requesting script for rollator    Person(s) Educated  Patient    Methods  Explanation;Demonstration;Handout    Comprehension  Verbalized understanding          PT Long Term Goals - 03/19/18 1029      PT LONG TERM GOAL #1   Title  Pt will be independent with HEP for improved balance, strength, gait.  TARGET 04/16/18    Time  4    Period  Weeks    Status  New    Target Date  04/16/18      PT LONG TERM GOAL #2   Title  Pt will improve Berg Balance score to at least 40/56 for decreased fall risk.    Time  4    Period  Weeks    Status  New    Target Date  04/16/18      PT LONG TERM GOAL #3   Title  Pt will improve TUG score to less than or equal to 18 seconds for decreased fall risk.    Time  4    Period  Weeks    Target Date  04/16/18      PT LONG TERM GOAL #4   Title  Pt will improve gait velocity to at least 1.8 ft/sec with cane for improved gait efficiency and safety.    Time  4    Period  Weeks    Status  New    Target Date  04/16/18      PT LONG TERM GOAL #5   Title  Pt will verbalize understanding of fall prevention in home environment.    Time  4    Period  Weeks    Status  New    Target Date  04/16/18            Plan - 04/05/18 1114    Clinical Impression Statement  Reviewed last exercises given as OTAGO HEP, then began to address walking program.  Pt ambulates during session today with cane and with rollator; walking program not formally initiated today due to pt's instability/increased pain in R knee and back when  using cane.  Discussed transition to use of rollator walker for more gait and potentially to begin walking program and pt plans to ask MD for script for  rollator at visit later today.    Rehab Potential  Good    PT Frequency  2x / week    PT Duration  4 weeks plus eval    PT Treatment/Interventions  ADLs/Self Care Home Management;DME Instruction;Balance training;Therapeutic exercise;Therapeutic activities;Functional mobility training;Gait training;Neuromuscular re-education;Patient/family education    PT Next Visit Plan  Discuss walking program; check on rollator status; additional balance exercises as appropriate    Consulted and Agree with Plan of Care  Patient       Patient will benefit from skilled therapeutic intervention in order to improve the following deficits and impairments:  Abnormal gait, Decreased balance, Decreased mobility, Difficulty walking, Decreased strength  Visit Diagnosis: Unsteadiness on feet  Other abnormalities of gait and mobility  Muscle weakness (generalized)     Problem List Patient Active Problem List   Diagnosis Date Noted  . Diabetes mellitus type 2 with complications ([place]) 80/88/1103  . CKD (chronic kidney disease) stage 3, GFR 30-59 ml/min ([facility]) 09/02/2017  . Primary osteoarthritis of right knee 06/22/2017  . S/P lumbar laminectomy 11/22/2014  . 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 Bilateral lower extremity edema 06/14/2014  . Low back pain 03/13/2014  . Bilateral pseudophakia 06/27/2013  . Diabetic neuropathy ([place])   . Skin cancer of arm 04/20/2013  . HTN (hypertension) 03/03/2013  . Hypercholesterolemia     Chaunta Bejarano W. 04/05/2018, 11:17 AM  Frazier Butt., PT   [facility] [address] [place], [place], [address] Phone: [phone]   Fax:  [phone]  Name: Christian [name] MRN: [id number] Date of Birth: [birth year]

## 2018-04-05 NOTE — Progress Notes (Signed)
Patient ID: Christian [name] MRN: [id number], DOB: [birth year], 83 y.o. Date of Encounter: @DATE @  Chief Complaint:  Chief Complaint  Patient presents with  . follow up with diabetes    HPI: 83 y.o. year old male  presents for routine 3 months f/u OV to f/u DM, HTN, and other chronic medical problems.   Hypertension: 12/03/2017: Taking meds as directed. No adverse effects.  Hyperlipidemia: 12/03/2017: On Zocor 40mg  and on Niaspan. No myalgias or other adverse medication effects.  Diabetes: In the past we had difficulty getting his diabetes controlled and he was very noncompliant with diet. All of this information is detailed and documented in my note 05/21/16. See that note for details of all of the past information. 03/2014 he was having lower extremity edema so Actos was stopped and was changed to [place]. Also --have added Invokana, but unable to afford- At the time of his OV 09/02/2017---He was on:  metformin, Glucotrol, Januvia. Labs done 09/02/17 showed elevated creatinine.  At that time I said to stop metformin and to decrease Januvia to 50 mg. Today--12/03/2017--- he states that when he stopped the metformin and decreased the Januvia, that his blood sugar went up really high and his vision got blurry.   He says that that scared him so he went back on the metformin and went back on the Januvia 100 mg.  At [place] 12/03/2017: Have told him to stop the metformin and decrease the Januvia to 50 mg.  Will continue the Glucotrol XL 10 mg daily.   To try to keep his blood sugar controlled with having to stop the metformin and decrease the Januvia,  I am adding Jardiance 25 mg every morning. I will have him come in for follow-up visit on Monday to make sure he has been able to make these medicine changes and to make sure that his blood sugar is controlled.   12/07/2017: Today he reports that he has been taking medications as directed at his recent visit 12/03/17. He did stop the metformin  and he did decrease the Januvia down to 50 mg. He did continue the Glucotrol 10 mg daily. He did add the Jardiance 25 mg daily. I asked if he had some blood sugar readings for me to review.  He says " Christian [name], I forgot to even check any blood sugars--- I was so busy adjusting these medicines." Says that the Jardiance cost $45 for him.  Says the Januvia cost him $35. He states that he is feeling fine after making these medicine adjustments.  He is having no adverse effects.       ------------------------------------------------------------------------------------------------------------------------------------------------------------------------------------------------------------------------------------------------------------------------  03/15/2018:  Today I reviewed his chart.   --Reviewed that his last routine 81-month office visit with me was 12/07/2017 which is documented above. --I reviewed that since that visit he had 2 visits with me regarding a fall at home.  The following information is copied from those visits.    02/18/2018: His daughter accompanies him for visit today. Had reviewed that his last routine visit with me was at the beginning of March and that at that visit things were fairly stable.  We had just recently adjusted some of his diabetic medications secondary to CKD.  Otherwise things were stable and from a symptomatic standpoint patient had been stable and had not been reporting any concerns or symptoms.  However, today this daughter who accompanies him today states that she has been noticing a decline with him for months.  Has noticed him  having more problems with his short-term memory.  Says that she knows that he comes in here for his visits and does not tell me that he is having any issues.  Says that he tries to appear as if things are fine and that he is handling everything fine but she does not think he is.  She states that she lives about 15 to 20 minutes from  him.  Says that on average she usually sees him 1-2 times per week but she had not seen him for 2 weeks up until yesterday-- prior to yesterday had not seen him for 2 weeks.  However says that she does talk to him on the phone more often.  States that he also has 1 son that lives in [place]. States that he has another son that lives in [place].  Has these 3 children.  She reports that all 3 of them work full-time. She works 4:30 am - noon.  We discussed that she would be available in the afternoons to come with him to follow-up office visits with me.  She reports that they have tried to bring up the issue that he seems to need some assistance but that he seems to "not want to hear it ".  She says that she "does not know whether he is just too proud or just too independent."  They report that he fell yesterday morning and was on the floor for an extended amount of time.  Patient states that he did not have a phone and that he had fallen and was on the floor and he tried to crawl but could not because his leg hurt.  Says that he finally got himself to a recliner/chair that he was able to hold to and try to walk but he held to the chair and collapsed again.  Somehow he did eventually get to a phone and called his daughter.  Says that all she heard on the phone was him saying "help me, help me".  She took him to the ER and I do have chest x-ray report and lab report that was done there but then he left the ER before evaluation was complete.  Today I reviewed with patient and daughter that significant findings from the labs and x-ray from the ER show: Troponin negative. Chest x-ray showed no acute abnormality Lab was significant for elevated white count Lab also significant for significant elevation of BUN and mild elevation of creatinine consistent with dehydration.  Discussed with patient and daughter that ideally he does need to be completely evaluated and completely treated at the  emergency room/hospital.  However he refuses to go back to the hospital. Discussed with patient and daughter that my plan would be to give him IV fluids here and to evaluate because of infection/elevated white count.  They both report that he has had significant congested cough for about 2 weeks.  Daughter states that even with talking to him on the telephone that she has been able to tell that he has had a "bad cough "for about 2 weeks.  Denies any other signs or symptoms of infection.  Has had no dysuria.  No pain in his suprapubic/pelvic region.  No abdominal pain.  No vomiting or diarrhea.  He has not been having any fevers or chills.    A/P AT THAT OV----  Discussed with patient and daughter that ideally he does need to be completely evaluated and completely treated at the emergency room/hospital.  However he refuses to  go back to the hospital. Discussed with patient and daughter that my plan would be to give him IV fluids here and to evaluate cause of infection/elevated white count.  They both report that he has had significant congested cough for about 2 weeks.  Daughter states that even with talking to him on the telephone that she has been able to tell that he has had a "bad cough "for about 2 weeks.  Denies any other signs or symptoms of infection.  Has had no dysuria.  No pain in his suprapubic/pelvic region.  No abdominal pain.  No vomiting or diarrhea.  He has not been having any fevers or chills.  Discussed whether his leg still has pain whether he is having any area of pain that needs x-ray to evaluate for fracture.  He reports that there is no area of significant pain at this point and refuses need for x-ray.  UA performed today shows no UTI.  He is treated here today with 2 bags of IV Fluids.  He is given Rx for Levaquin 55m QD x 7 days.  He has scheduled f/u OV with me on Monday afternoon--this daughter is available to come with him to that OV.  He is to go to ER if  symptoms worsen in interim. Pt and daughter voice understanding, agree.    02/22/2018: Today pt looks much better. Says he feels a lot better too.  Reports he has been taking Levaquin as directed. Says cough is better already.  Says he has been up, walking around house some.  I discussed with pt and daughter--- whether they feel he is safe at home, can care for himself. Discussed cost of care at home, assisted living, nursing home etc. They are to discuss as a family regarding finances etc. I discussed that insurance should cover Physical Therapy and that could improve his strength and balance etc. He is agreeable to proceed with PT.  Daughter requests medication for him to use for allergy symptoms that is safe to use with his other meds--says he has had some sneezing, rhinorrhea. AT THAT OV: Placed order for physical therapy Obtained follow-up CBC and BMET. Also daughter plan to get life alert for patient to have available to call if needs help.    03/15/2018: Today I asked patient about physical therapy.  He states that he never heard anything from them.  He states that he does not have any type of voicemail or answering machine. He states that he has had no further falls or near falls. He does now have a life alert and is wearing this today. While he was here in the office during his visit I spoke to referral staff and they got his appointment scheduled with physical therapy and told him date and time for him to go for physical therapy as I was concerned he would not get that via phone message.  That is scheduled during today's visit. Today he reports that he did go to the kidney doctor.  He has paperwork and [facility] The paperwork indicates that they were ordering labs and kidney ultrasound.  States that he does have follow-up appointment scheduled to go back to them for follow-up.  He has no other updates today.  Has no specific concerns to  address today.  Today he reports that he has been taking medications as directed.  He is taking the Glucotrol 10 mg daily.  He did not bring in any blood sugar  readings for me to review. He is taking the simvastatin and Niaspan.  Also still taking the gabapentin for neuropathy.     Addendum added 03/18/2018: Received note from [facility].  Date of encounter 03/09/2018.  Dr. Terri Skains. His note includes the following: "HPI: This patient is an 83 year old male who presents with chronic kidney disease.  This is classified as stage 34. 83 year old white male with history of type 2 diabetes for > 35 years (A1c 7.4, as high as 8.9), hypertension, hyperlipidemia, neuropathy, nephrolithiasis,  referred by PCP for evaluation of elevated serum creatinine level and CKD management.   Patient had serum creatinine level of 2.32 on 02/22/2018, 2.5 on 02/17/2018, 1.6---11/2017, 1.36 --- 04/2015. He uses Aleve occasionally for pain management. Denies urinary symptoms. No lower extremity edema. No history of hepatitis or HIV.  No tobacco or excessive alcohol use.  No family history of kidney disease. He is on lisinopril.  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   Serum creatinine level gradually increasing at least since 2016 with last serum creatinine level 2.32 on 02/2018.   Check renal panel, urinalysis, spot urine PCR, hepatitis B, C, and HIV serology.   Plan to check CKD labs including anemia and bone disease as below.   US renal to evaluate kidney size and echogenicity.   Advised to avoid NSAIDs and avoid nephrotoxins.   I discussed the stages of kidney disease and the importance of better control of DM, HTN, and avoiding nephrotoxins in order to delay progression of CKD.   On KCl oral, we will follow-up today's lab." Hypertension, renal disease: Today's impression: BP  suboptimal.  On lisinopril.  Advised low-salt diet and monitor BP.  Addendum note was added by Dr. Junie Panning on 03/15/18 "Kidney function is better with creatinine 1.31.   Found to have low iron and hemoglobin 10.6.  Please call in for oral iron 325 mg twice daily. Iron sat 16, serum iron 43 PTH 14, vitamin D 29.5 "   03/24/2018: At visit 03/15/2018 labs came back showing A1c 9.5. At that time had him schedule another OV with me for a 30-minute slot to discuss starting basal insulin. Returns for that visit today. So today I did review that he did have OV with [facility] and reviewed above addendum with their information. Also today I did review that he has been having visits with physical therapy. He has no concerns to address today.  Is here to discuss insulin. 30 minutes was spent in face-to-face interaction with him discussing: --- Stop glipizide/Glucotrol. --- Check fasting blood sugar every morning and document on the log form that I have given him today. --- Give insulin 15 units each morning. --- Scheduled follow-up visit with me on Monday at 11:00 AM.  He is to bring that blood sugar log form with him to that visit. Today I have given him a sample of Basaglar. I have explained that his insurance may cover a different brand name of insulin but that will be an equivalent. Have shown him how to use the insulin pen and how to dial to the 15 units. Written all information down on paper for him to have to refer to at home. Have answered all questions and he voices understanding.   03/31/2018: Today he reports that he did stop the Glucotrol/glipizide. Today he reports that he has been given the insulin at 15 units each  morning. Today he does bring in his blood sugar log form. He has been getting the following fasting morning readings: 349, 364, 372, 292, 327, 333, 323  He has no other concerns to address today.     04/05/2018: He brings in blood sugar log form. Has  been administering insulin as directed. Unfortunately, his blood sugar is still elevated and really has not come down much. However, I do think that he is his blood sugar meter is reading correctly and that his sugar may still be high because his A1c was at 9.5.  Ever it is that his sugars are reading similar to what they were a week ago on basically half the amount of insulin that he is on now and he says that his diet really has not changed over the past week. Nonetheless his most reading recent readings are: Wednesday 6/26------15 units----- 323 Thursday 6/27---------20 units -----367 Friday 6/28--------------22 units------286 A 6/29----------------------24 units------ 350 Sunday 6/30-------------26 units------- 353 Monday 7/1-----------28 units----------- 362    Past Medical History:  Diagnosis Date  . Arthritis   . Diabetes mellitus    Type 2  . Diabetic neuropathy ([place])   . History of kidney stones   . HTN (hypertension) 03/03/2013  . Hypercholesterolemia   . 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 Skin cancer of arm 04/20/2013   left upper arm  . Spinal stenosis 05/17/2014   L3-L4   . Urgency of urination      Home Meds: Outpatient Medications Prior to Visit  Medication Sig Dispense Refill  . acetaminophen (TYLENOL) 500 MG tablet Take 1,000 mg by mouth every 8 (eight) hours as needed for mild pain or moderate pain (back pain).    Marland Kitchen aspirin EC 81 MG tablet Take 81 mg by mouth daily.      . Cetirizine HCl (ZYRTEC ALLERGY) 10 MG CAPS Take once daily as needed for allergy symptoms. (May cause drowsiness) 30 capsule 5  . cholecalciferol (VITAMIN D) 1000 UNITS tablet Take 1,000 Units by mouth daily.      . Cyanocobalamin (B-12 PO) Take 1 tablet by mouth daily.    . fish oil-omega-3 fatty acids 1000 MG capsule Take 3 g by mouth 2 (two) times daily.      Marland Kitchen gabapentin (NEURONTIN) 600 MG tablet TAKE 1 TABLET BY MOUTH THREE TIMES DAILY 90 tablet 3  . HYDROcodone-acetaminophen (NORCO)  7.5-325 MG tablet Take 1 tablet by mouth every 6 (six) hours as needed for moderate pain. 30 tablet 0  . Insulin Glargine (LANTUS SOLOSTAR) 100 UNIT/ML Solostar Pen Inject 15 Units into the skin every morning. Start at 15 units each morning-up to 50 units in the daily in the  morning (Patient taking differently: Inject 15 Units into the skin every morning. Start at 15 units each morning-up to 50 units in the daily in the  Morning,patient taking 28 units) 3 pen 3  . lisinopril (PRINIVIL,ZESTRIL) 10 MG tablet Take 1 tablet (10 mg total) by mouth daily. 90 tablet 0  . Multiple Vitamin (MULITIVITAMIN WITH MINERALS) TABS Take 1 tablet by mouth daily.      . niacin (NIASPAN) 500 MG CR tablet TAKE 1 TABLET BY MOUTH AT BEDTIME 90 tablet 0  . ONE TOUCH ULTRA TEST test strip USE TO CHECK FASTING BLOOD SUGARS THREE TIMES DAILY 100 each 11  . potassium gluconate 595 MG TABS tablet Take 595 mg by mouth daily.    . simvastatin (ZOCOR) 40 MG tablet TAKE 1 TABLET BY MOUTH DAILY WITH  BREAKFAST  90 tablet 0   No facility-administered medications prior to visit.     Allergies:  Allergies  Allergen Reactions  . Actos [Pioglitazone]     LE Edema--03/2014    Social History   Socioeconomic History  . Marital status: Divorced    Spouse name: Not on file  . Number of children: Not on file  . Years of education: Not on file  . Highest education level: Not on file  Occupational History  . Not on file  Social Needs  . Financial resource strain: Not on file  . Food insecurity:    Worry: Not on file    Inability: Not on file  . Transportation needs:    Medical: Not on file    Non-medical: Not on file  Tobacco Use  . Smoking status: Former Smoker    Last attempt to quit: 03/04/2003    Years since quitting: 15.0  . Smokeless tobacco: Never Used  Substance and Sexual Activity  . Alcohol use: No  . Drug use: No  . Sexual activity: Not on file  Lifestyle  . Physical activity:    Days per week: Not on file     Minutes per session: Not on file  . Stress: Not on file  Relationships  . Social connections:    Talks on phone: Not on file    Gets together: Not on file    Attends religious service: Not on file    Active member of club or organization: Not on file    Attends meetings of clubs or organizations: Not on file    Relationship status: Not on file  . Intimate partner violence:    Fear of current or ex partner: Not on file    Emotionally abused: Not on file    Physically abused: Not on file    Forced sexual activity: Not on file  Other Topics Concern  . Not on file  Social History Narrative  . Not on file    History reviewed. No pertinent family history.   Review of Systems:  See HPI for pertinent ROS. All other ROS negative.     Physical Exam: Blood pressure 134/68, pulse 77, temperature (!) 97.4 F (36.3 C), temperature source Oral, resp. rate 18, height 5\' 8"  (1.727 m), weight 90.6 kg (199 lb 12.8 oz), SpO2 97 %., Body mass index is 30.38 kg/m. General: WNWD WM. Appears in no acute distress. Neck: Supple. No thyromegaly. No lymphadenopathy. Lungs: Clear bilaterally to auscultation without wheezes, rales, or rhonchi. Breathing is unlabored. Heart: RRR with S1 S2. No murmurs, rubs, or gallops. Musculoskeletal:  Strength and tone normal for age. Extremities/Skin: Warm and dry. No edema.  Neuro: Alert and oriented X 3. Moves all extremities spontaneously. Gait is normal. CNII-XII grossly in tact. Psych:  Responds to questions appropriately with a normal affect.     Diabetic foot exam: Inspection: On the dorsal aspect of his feet he has a lot of scaly skin. No open wounds or sores. Sensation is intact. Plantar surface of his feet look good. No calluses or wounds. Trace left DP pulse. Trace right DP pulse. No palpable posterior tibial pulses bilaterally.      ASSESSMENT AND PLAN:  83 y.o. year old male with    1. Type 2 diabetes mellitus with complication, with long-term  current use of insulin ([place])  2. Diabetic polyneuropathy associated with other specified diabetes mellitus ([place])  3. CKD (chronic kidney disease) stage 3, GFR 30-59 ml/min ([facility])  03/24/2018: 30  minutes was spent in face-to-face interaction with him  At visit 03/15/2018 labs came back showing A1c 9.5. At that time had him schedule another OV with me for a 30-minute slot to discuss starting basal insulin. Returns for that visit today. So today I did review that he did have OV with [facility] and reviewed above addendum with their information. Also today I did review that he has been having visits with physical therapy. He has no concerns to address today.  Is here to discuss insulin. 30 minutes was spent in face-to-face interaction with him discussing: --- Stop glipizide/Glucotrol. --- Check fasting blood sugar every morning and document on the log form that I have given him today. --- Give insulin 15 units each morning. --- Scheduled follow-up visit with me on Monday at 11:00 AM.  He is to bring that blood sugar log form with him to that visit. Today I have given him a sample of Basaglar. I have explained that his insurance may cover a different brand name of insulin but that will be an equivalent. Have shown him how to use the insulin pen and how to dial to the 15 units. Written all information down on paper for him to have to refer to at home. Have answered all questions and he voices understanding. Monday he has an appointment with physical therapy at 8:45.  He will go to that and then come here for visit at 11:00.  He will follow-up sooner if needed.   03/31/2018: Today I have written down the following instructions regarding titrating up his insulin. Will increase by 5 units tomorrow morning and then will increase by 2 units/day after that. He will return for follow-up visit with me on Monday.  He has physical therapy that morning at 8 AM and is going to see me at 10  AM. He is to bring this blood sugar log form with him to that visit. Follow-up sooner if any concerns.   Thursday, 03/25/2018-----15 units------ 349 Friday, 03/26/2018----------15 units------364 Saturday, 03/27/2018----- 15 units-------372 Sunday, 03/28/2018--------15 units--------292 Monday, 03/29/2018-------- 15 units------ 327 Tuesday 03/30/2018--------15 units------- 333 Wednesday, 03/31/2018--- 15 units------ 323 Thursday, 04/01/2018--------20 units Friday, 04/02/2018-----------22 units Saturday 04/03/2018--------24 units Sunday, 04/04/2018----------26 units Monday, 04/05/2018   04/05/2018:  Thursday, 03/25/2018-----15 units------ 349 Friday, 03/26/2018----------15 units------364 Saturday, 03/27/2018----- 15 units-------372 Sunday, 03/28/2018--------15 units--------292 Monday, 03/29/2018-------- 15 units------ 327 Tuesday 03/30/2018--------15 units------- 333 Wednesday, 03/31/2018--- 15 units------ 323 Thursday, 04/01/2018--------20 units------367 Friday, 04/02/2018-----------22 units--------286 Saturday 04/03/2018--------24 units---------350 Sunday, 04/04/2018----------26 units---------353 Monday, 04/05/2018------------28 units---------362 Tuesday, 04/06/2018-----------34 units Wednesday, 04/07/2018-------36 units Thursday, 04/08/2018-----------38 units Friday 04/09/2018----------------40 units Saturday 04/10/2018-------------42 units Sunday, 04/11/2018--------------44 units  Monday, 04/12/2018-------------46 units   Increase insulin by 6 units today and then will increase by 2 units daily. He will schedule follow-up visit with me 1 week from today. On that day he has his physical therapy appointment at 930.  He will schedule his visit with me at a time that will be good for him given that appointment. If he does start getting blood sugar readings around 150 then he will pause and stay at that amount of insulin instead of continuing to increase. Otherwise if sugars continue greater than 150 then he will  titrate insulin as directed.     THE FOLLOWING IS PULLED FORWARD FROM OV NOTE 03/15/2018---NOT ADDRESSED AT OV 03/24/2018, 03/31/2018:  12/07/2017:For him the Vania Rea is costing $45.  Celesta Gentile is costing $35. Today I checked for samples but we only have one bottle remaining of samples for the Jardiance and I have given  him that.  We had no samples of the Januvia. I had discussion with him explaining that I do not have any cheaper options that we can use at this point. Discussed that our options are limited in the future if his A1c is elevated only things to consider adding would be once weekly Trulicity or another med from that class or adding Lantus. He is to check fasting blood sugars and call me with these after checking for several days. Also, I told him that in general even down the road that if he thinks he is having uncontrolled blood sugars or thinks he is having any side effects from meds etc. to call and inform me rather than adjusting medicines on his own.  He voices understanding and agrees.  This point will plan for follow-up visit in 3 months but to follow-up sooner if needed and call with blood sugar readings after med changes.  Also in the past he has not been able to afford Invokana. Also in the past has history of lower extremity edema with Actos.  03/15/2018: Secondary to CKD and cost metformin has been discontinued and also Jardiance and Januvia.  Now is currently just on Glucotrol 10 mg.  Will check A1c and then follow-up accordingly.  He does have annual eye exam.  On ASA 81mg  QD On statin. On ACE inhibitor.  Has had Pneumovax 23 in past. Had Prevnar 13-- 12/2013.  Diabetic mononeuropathy associated with diabetes mellitus due to underlying condition ([place]) 03/15/2018---Stable/controlled with current dose of Neurontin  CKD (chronic kidney disease) stage 3, GFR 30-59 ml/min ([facility]) 03/15/2018: Some diabetic medications have been discontinued secondary to CKD.  Also he is now  established with Dr. Carolin Sicks at [facility].  He is now managing CKD.  Addendum added 03/18/2018: Received note from [place].  Date of encounter 03/09/2018.  Dr. Terri Skains. His note includes the following: "HPI: This patient is an 83 year old male who presents with chronic kidney disease.  This is classified as stage 71. 83 year old white male with history of type 2 diabetes for > 35 years (A1c 7.4, as high as 8.9), hypertension, hyperlipidemia, neuropathy, nephrolithiasis,  referred by PCP for evaluation of elevated serum creatinine level and CKD management.   Patient had serum creatinine level of 2.32 on 02/22/2018, 2.5 on 02/17/2018, 1.6---11/2017, 1.36 --- 04/2015. He uses Aleve occasionally for pain management. Denies urinary symptoms. No lower extremity edema. No history of hepatitis or HIV.  No tobacco or excessive alcohol use.  No family history of kidney disease. He is on lisinopril.  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   Serum creatinine level gradually increasing at least since 2016 with last serum creatinine level 2.32 on 02/2018.   Check renal panel, urinalysis, spot urine PCR, hepatitis B, C, and HIV serology.   Plan to check CKD labs including anemia and bone disease as below.   US renal to evaluate kidney size and echogenicity.   Advised to avoid NSAIDs and avoid nephrotoxins.   I discussed the stages of kidney disease and the importance of better control of DM, HTN, and avoiding nephrotoxins in order to delay progression of CKD.   On KCl oral, we will follow-up today's lab." Hypertension, renal disease: Today's impression: BP suboptimal.  On lisinopril.  Advised low-salt diet and monitor BP.  Addendum note was added by Dr. Junie Panning on 03/15/18 "Kidney function is better with creatinine  1.31.   Found to have low iron and  hemoglobin 10.6.  Please call in for oral iron 325 mg twice daily. Iron sat 16, serum iron 43 PTH 14, vitamin D 29.5 "    Essential hypertension 03/15/2018:  Blood pressure is at goal/controlled. He is on ACE inhibitor.Check labs to monitor.   Hypercholesterolemia 03/15/2018---He is on Statin and Niaspan. Last FLP at goal. LFTs normal. Cont current meds.  Bilateral lower extremity edema 03/15/2018---He uses HCTZ 12.5mg  PRN swelling--no longer having to use daily.    screening PSA: Normal 07/2011. 05/27/2017---Will not repeat given advanced age   screening colonoscopy: At recent office visits we have discussed that this was due.  At visit 06/2014 he says that he needs to get his back pain control prior to scheduling this. Given age 74, can probably stop these--will discuss with pt Discussed colonoscpoy with patient at visit 04/12/15. He does seem interested in wanting to have continued follow-up colonoscopies. However at today's visit he just says it to much is going on and for [place] to rediscuss it at his next visit once things settle down. Discussed visit 05/21/16 and he is agreeable for me to place order for referral to GI for follow-up colonoscopy.---Ordered referrral to GI -- 05/21/2016 08/21/2016---Has colonoscopy scheduled this upcoming week.  11/26/2016--He reports that Dr. Kalman Shan gave him heme[facility]ults cards to turn in --did not have to do colonoscopy  Immunizations: Pneumonia vaccine: Received Pneumovax here 05/01/2008. Updated with Prevnar 13 12/2013.Received 06/23/2016 Tetanus: Given here 07/23/2011 Zostavax: He checked with his insurance-- cost would be $200 patient defers. Shingrix---05/27/2017----currently on back-order---will discuss in future--when available Influenza Vaccine: Given here 08/06/2015      Signed, Karis Juba, [place], [facility] 04/05/2018 10:16 AM

## 2018-04-07 ENCOUNTER — Telehealth: Payer: Self-pay

## 2018-04-07 ENCOUNTER — Ambulatory Visit
Admission: RE | Admit: 2018-04-07 | Discharge: 2018-04-07 | Disposition: A | Discharge status: Home / Self Care | Payer: PPO | Source: Ambulatory Visit | Attending: Nephrology | Admitting: Nephrology

## 2018-04-07 DIAGNOSIS — N184 Chronic kidney disease, stage 4 (severe): Secondary | ICD-10-CM

## 2018-04-07 NOTE — Telephone Encounter (Signed)
Received fax from Amy stating patient is at fall risk per balance scores at evaluation-Berg score32/56(scores<45/56 indicate increased fall risk)TUG score 24.65 sec(>13.5 seconds indicated increased fall risk) gait velocity with cane(<1.75ft/sec indicates increased fallrisk).  Per Amy patient is unsteady with walking with his cane and appears to have improved posture,steadiness with gait and decreased reports of pain when using rollator. Amy is requesting an order for a rollator walker. PCP signed order

## 2018-04-12 ENCOUNTER — Ambulatory Visit (INDEPENDENT_AMBULATORY_CARE_PROVIDER_SITE_OTHER): Payer: PPO | Admitting: Physician Assistant

## 2018-04-12 ENCOUNTER — Encounter: Payer: Self-pay | Admitting: Physician Assistant

## 2018-04-12 ENCOUNTER — Ambulatory Visit: Payer: PPO | Admitting: Physical Therapy

## 2018-04-12 VITALS — BP 122/60 | HR 80 | Temp 97.8°F | Resp 16 | Ht 68.0 in | Wt 201.6 lb

## 2018-04-12 DIAGNOSIS — E118 Type 2 diabetes mellitus with unspecified complications: Secondary | ICD-10-CM

## 2018-04-12 DIAGNOSIS — R2681 Unsteadiness on feet: Secondary | ICD-10-CM

## 2018-04-12 DIAGNOSIS — Z794 Long term (current) use of insulin: Secondary | ICD-10-CM

## 2018-04-12 DIAGNOSIS — Z9181 History of falling: Secondary | ICD-10-CM | POA: Diagnosis not present

## 2018-04-12 DIAGNOSIS — R2689 Other abnormalities of gait and mobility: Secondary | ICD-10-CM

## 2018-04-12 NOTE — Patient Instructions (Signed)
Once you get your rollator, you will want to incorporate walking into your exercise routine.  WALKING  Walking is a great form of exercise to increase your strength, endurance and overall fitness.  A walking program can help you start slowly and gradually build endurance as you go.  Everyone's ability is different, so each person's starting point will be different.  You do not have to follow them exactly.  The are just samples. You should simply find out what's right for you and stick to that program.   In the beginning, you'll start off walking 2-3 times a day for short distances.  As you get stronger, you'll be walking further at just 1-2 times per day.  A. You Can Walk For A Certain Length Of Time Each Day    Walk 5 minutes 3 times per day.  Increase 2 minutes every 2 days (3 times per day).  Work up to 25-30 minutes (1-2 times per day).   Example:   Day 1-2 5 minutes 3 times per day   Day 7-8 8 minutes 2-3 times per day   Day 13-14 10-15 minutes 1-2 times per day     You can try to do this in your home, your driveway, or working up to walking at [employer] or [employer] for your walking exercise.

## 2018-04-12 NOTE — Progress Notes (Signed)
Patient ID: JOSHUWA VECCHIO MRN: [id number], DOB: 1935/09/19, 83 y.o. Date of Encounter: @DATE @  Chief Complaint:  Chief Complaint  Patient presents with  . diabetes follow up    HPI: 83 y.o. year old male  presents for routine 3 months f/u OV to f/u DM, HTN, and other chronic medical problems.   Hypertension: 12/03/2017: Taking meds as directed. No adverse effects.  Hyperlipidemia: 12/03/2017: On Zocor 40mg  and on Niaspan. No myalgias or other adverse medication effects.  Diabetes: In the past we had difficulty getting his diabetes controlled and he was very noncompliant with diet. All of this information is detailed and documented in my note 05/21/16. See that note for details of all of the past information. 03/2014 he was having lower extremity edema so Actos was stopped and was changed to [place]. Also --have added Invokana, but unable to afford- At the time of his OV 09/02/2017---He was on:  metformin, Glucotrol, Januvia. Labs done 09/02/17 showed elevated creatinine.  At that time I said to stop metformin and to decrease Januvia to 50 mg. Today--12/03/2017--- he states that when he stopped the metformin and decreased the Januvia, that his blood sugar went up really high and his vision got blurry.   He says that that scared him so he went back on the metformin and went back on the Januvia 100 mg.  At [place] 12/03/2017: Have told him to stop the metformin and decrease the Januvia to 50 mg.  Will continue the Glucotrol XL 10 mg daily.   To try to keep his blood sugar controlled with having to stop the metformin and decrease the Januvia,  I am adding Jardiance 25 mg every morning. I will have him come in for follow-up visit on Monday to make sure he has been able to make these medicine changes and to make sure that his blood sugar is controlled.   12/07/2017: Today he reports that he has been taking medications as directed at his recent visit 12/03/17. He did stop the metformin and he  did decrease the Januvia down to 50 mg. He did continue the Glucotrol 10 mg daily. He did add the Jardiance 25 mg daily. I asked if he had some blood sugar readings for me to review.  He says " [name], I forgot to even check any blood sugars--- I was so busy adjusting these medicines." Says that the Jardiance cost $45 for him.  Says the Januvia cost him $35. He states that he is feeling fine after making these medicine adjustments.  He is having no adverse effects.       ------------------------------------------------------------------------------------------------------------------------------------------------------------------------------------------------------------------------------------------------------------------------  03/15/2018:  Today I reviewed his chart.   --Reviewed that his last routine 11-month office visit with me was 12/07/2017 which is documented above. --I reviewed that since that visit he had 2 visits with me regarding a fall at home.  The following information is copied from those visits.    02/18/2018: His daughter accompanies him for visit today. Had reviewed that his last routine visit with me was at the beginning of March and that at that visit things were fairly stable.  We had just recently adjusted some of his diabetic medications secondary to CKD.  Otherwise things were stable and from a symptomatic standpoint patient had been stable and had not been reporting any concerns or symptoms.  However, today this daughter who accompanies him today states that she has been noticing a decline with him for months.  Has noticed him having  more problems with his short-term memory.  Says that she knows that he comes in here for his visits and does not tell me that he is having any issues.  Says that he tries to appear as if things are fine and that he is handling everything fine but she does not think he is.  She states that she lives about 15 to 20 minutes from him.  Says  that on average she usually sees him 1-2 times per week but she had not seen him for 2 weeks up until yesterday-- prior to yesterday had not seen him for 2 weeks.  However says that she does talk to him on the phone more often.  States that he also has 1 son that lives in [place]. States that he has another son that lives in [place].  Has these 3 children.  She reports that all 3 of them work full-time. She works 4:30 am - noon.  We discussed that she would be available in the afternoons to come with him to follow-up office visits with me.  She reports that they have tried to bring up the issue that he seems to need some assistance but that he seems to "not want to hear it ".  She says that she "does not know whether he is just too proud or just too independent."  They report that he fell yesterday morning and was on the floor for an extended amount of time.  Patient states that he did not have a phone and that he had fallen and was on the floor and he tried to crawl but could not because his leg hurt.  Says that he finally got himself to a recliner/chair that he was able to hold to and try to walk but he held to the chair and collapsed again.  Somehow he did eventually get to a phone and called his daughter.  Says that all she heard on the phone was him saying "help me, help me".  She took him to the ER and I do have chest x-ray report and lab report that was done there but then he left the ER before evaluation was complete.  Today I reviewed with patient and daughter that significant findings from the labs and x-ray from the ER show: Troponin negative. Chest x-ray showed no acute abnormality Lab was significant for elevated white count Lab also significant for significant elevation of BUN and mild elevation of creatinine consistent with dehydration.  Discussed with patient and daughter that ideally he does need to be completely evaluated and completely treated at the emergency  room/hospital.  However he refuses to go back to the hospital. Discussed with patient and daughter that my plan would be to give him IV fluids here and to evaluate because of infection/elevated white count.  They both report that he has had significant congested cough for about 2 weeks.  Daughter states that even with talking to him on the telephone that she has been able to tell that he has had a "bad cough "for about 2 weeks.  Denies any other signs or symptoms of infection.  Has had no dysuria.  No pain in his suprapubic/pelvic region.  No abdominal pain.  No vomiting or diarrhea.  He has not been having any fevers or chills.    A/P AT THAT OV----  Discussed with patient and daughter that ideally he does need to be completely evaluated and completely treated at the emergency room/hospital.  However he refuses to go  back to the hospital. Discussed with patient and daughter that my plan would be to give him IV fluids here and to evaluate cause of infection/elevated white count.  They both report that he has had significant congested cough for about 2 weeks.  Daughter states that even with talking to him on the telephone that she has been able to tell that he has had a "bad cough "for about 2 weeks.  Denies any other signs or symptoms of infection.  Has had no dysuria.  No pain in his suprapubic/pelvic region.  No abdominal pain.  No vomiting or diarrhea.  He has not been having any fevers or chills.  Discussed whether his leg still has pain whether he is having any area of pain that needs x-ray to evaluate for fracture.  He reports that there is no area of significant pain at this point and refuses need for x-ray.  UA performed today shows no UTI.  He is treated here today with 2 bags of IV Fluids.  He is given Rx for Levaquin 500mg  QD x 7 days.  He has scheduled f/u OV with me on Monday afternoon--this daughter is available to come with him to that OV.  He is to go to ER if symptoms worsen  in interim. Pt and daughter voice understanding, agree.    02/22/2018: Today pt looks much better. Says he feels a lot better too.  Reports he has been taking Levaquin as directed. Says cough is better already.  Says he has been up, walking around house some.  I discussed with pt and daughter--- whether they feel he is safe at home, can care for himself. Discussed cost of care at home, assisted living, nursing home etc. They are to discuss as a family regarding finances etc. I discussed that insurance should cover Physical Therapy and that could improve his strength and balance etc. He is agreeable to proceed with PT.  Daughter requests medication for him to use for allergy symptoms that is safe to use with his other meds--says he has had some sneezing, rhinorrhea. AT THAT OV: Placed order for physical therapy Obtained follow-up CBC and BMET. Also daughter plan to get life alert for patient to have available to call if needs help.    03/15/2018: Today I asked patient about physical therapy.  He states that he never heard anything from them.  He states that he does not have any type of voicemail or answering machine. He states that he has had no further falls or near falls. He does now have a life alert and is wearing this today. While he was here in the office during his visit I spoke to referral staff and they got his appointment scheduled with physical therapy and told him date and time for him to go for physical therapy as I was concerned he would not get that via phone message.  That is scheduled during today's visit. Today he reports that he did go to the kidney doctor.  He has paperwork and it is Dr. Noel Journey at [facility]. The paperwork indicates that they were ordering labs and kidney ultrasound.  States that he does have follow-up appointment scheduled to go back to them for follow-up.  He has no other updates today.  Has no specific concerns to address  today.  Today he reports that he has been taking medications as directed.  He is taking the Glucotrol 10 mg daily.  He did not bring in any blood sugar readings  for me to review. He is taking the simvastatin and Niaspan.  Also still taking the gabapentin for neuropathy.     Addendum added 03/18/2018: Received note from [place].  Date of encounter 03/09/2018.  Dr. Terri Skains. His note includes the following: "HPI: This patient is an 83 year old male who presents with chronic kidney disease.  This is classified as stage 15. 83 year old white male with history of type 2 diabetes for > 35 years (A1c 7.4, as high as 8.9), hypertension, hyperlipidemia, neuropathy, nephrolithiasis,  referred by PCP for evaluation of elevated serum creatinine level and CKD management.   Patient had serum creatinine level of 2.32 on 02/22/2018, 2.5 on 02/17/2018, 1.6---11/2017, 1.36 --- 04/2015. He uses Aleve occasionally for pain management. Denies urinary symptoms. No lower extremity edema. No history of hepatitis or HIV.  No tobacco or excessive alcohol use.  No family history of kidney disease. He is on lisinopril.  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   Serum creatinine level gradually increasing at least since 2016 with last serum creatinine level 2.32 on 02/2018.   Check renal panel, urinalysis, spot urine PCR, hepatitis B, C, and HIV serology.   Plan to check CKD labs including anemia and bone disease as below.   US renal to evaluate kidney size and echogenicity.   Advised to avoid NSAIDs and avoid nephrotoxins.   I discussed the stages of kidney disease and the importance of better control of DM, HTN, and avoiding nephrotoxins in order to delay progression of CKD.   On KCl oral, we will follow-up today's lab." Hypertension, renal disease: Today's impression: BP suboptimal.   On lisinopril.  Advised low-salt diet and monitor BP.  Addendum note was added by Dr. Junie Panning on 03/15/18 "Kidney function is better with creatinine 1.31.   Found to have low iron and hemoglobin 10.6.  Please call in for oral iron 325 mg twice daily. Iron sat 16, serum iron 43 PTH 14, vitamin D 29.5 "   03/24/2018: At visit 03/15/2018 labs came back showing A1c 9.5. At that time had him schedule another OV with me for a 30-minute slot to discuss starting basal insulin. Returns for that visit today. So today I did review that he did have OV with [facility] and reviewed above addendum with their information. Also today I did review that he has been having visits with physical therapy. He has no concerns to address today.  Is here to discuss insulin. 30 minutes was spent in face-to-face interaction with him discussing: --- Stop glipizide/Glucotrol. --- Check fasting blood sugar every morning and document on the log form that I have given him today. --- Give insulin 15 units each morning. --- Scheduled follow-up visit with me on Monday at 11:00 AM.  He is to bring that blood sugar log form with him to that visit. Today I have given him a sample of Basaglar. I have explained that his insurance may cover a different brand name of insulin but that will be an equivalent. Have shown him how to use the insulin pen and how to dial to the 15 units. Written all information down on paper for him to have to refer to at home. Have answered all questions and he voices understanding.   03/31/2018: Today he reports that he did stop the Glucotrol/glipizide. Today he reports that he has been given the insulin at 15 units each morning.  Today he does bring in his blood sugar log form. He has been getting the following fasting morning readings: 349, 364, 372, 292, 327, 333, 323  He has no other concerns to address today.     04/05/2018: He brings in blood sugar log form. Has been  administering insulin as directed. Unfortunately, his blood sugar is still elevated and really has not come down much. However, I do think that he is his blood sugar meter is reading correctly and that his sugar may still be high because his A1c was at 9.5.  Ever it is that his sugars are reading similar to what they were a week ago on basically half the amount of insulin that he is on now and he says that his diet really has not changed over the past week. Nonetheless his most reading recent readings are: Wednesday 6/26------15 units----- 323 Thursday 6/27---------20 units -----367 Friday 6/28--------------22 units------286 A 6/29----------------------24 units------ 350 Sunday 6/30-------------26 units------- 353 Monday 7/1-----------28 units----------- 362   04/12/2018:  He brings in blood sugar log form. He has been administering insulin as directed. No other concerns to address today. Thursday, 03/25/2018-----15 units------ 349 Friday, 03/26/2018----------15 units------364 Saturday, 03/27/2018----- 15 units-------372 Sunday, 03/28/2018--------15 units--------292 Monday, 03/29/2018-------- 15 units------ 327 Tuesday 03/30/2018--------15 units------- 333 Wednesday, 03/31/2018--- 15 units------ 323 Thursday, 04/01/2018--------20 units------367 Friday, 04/02/2018-----------22 units--------286 Saturday 04/03/2018--------24 units---------350 Sunday, 04/04/2018----------26 units---------353 Monday, 04/05/2018------------28 units---------362 Tuesday, 04/06/2018-----------34 units--------255 Wednesday, 04/07/2018-------36 units-------294 Thursday, 04/08/2018-----------38 units-------300 Friday 04/09/2018----------------40 units-----------234 Saturday 04/10/2018-------------42 units--------344 Sunday, 04/11/2018--------------44 units -------379 Monday, 04/12/2018-------------46 units----------290    Past Medical History:  Diagnosis Date  . Arthritis   . Diabetes mellitus    Type 2  . Diabetic neuropathy  ([place])   . History of kidney stones   . HTN (hypertension) 03/03/2013  . Hypercholesterolemia   . 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 Skin cancer of arm 04/20/2013   left upper arm  . Spinal stenosis 05/17/2014   L3-L4   . Urgency of urination      Home Meds: Outpatient Medications Prior to Visit  Medication Sig Dispense Refill  . acetaminophen (TYLENOL) 500 MG tablet Take 1,000 mg by mouth every 8 (eight) hours as needed for mild pain or moderate pain (back pain).    Marland Kitchen aspirin EC 81 MG tablet Take 81 mg by mouth daily.      . Cetirizine HCl (ZYRTEC ALLERGY) 10 MG CAPS Take once daily as needed for allergy symptoms. (May cause drowsiness) 30 capsule 5  . cholecalciferol (VITAMIN D) 1000 UNITS tablet Take 1,000 Units by mouth daily.      . Cyanocobalamin (B-12 PO) Take 1 tablet by mouth daily.    . fish oil-omega-3 fatty acids 1000 MG capsule Take 3 g by mouth 2 (two) times daily.      Marland Kitchen gabapentin (NEURONTIN) 600 MG tablet TAKE 1 TABLET BY MOUTH THREE TIMES DAILY 90 tablet 3  . HYDROcodone-acetaminophen (NORCO) 7.5-325 MG tablet Take 1 tablet by mouth every 6 (six) hours as needed for moderate pain. 30 tablet 0  . Insulin Glargine (LANTUS SOLOSTAR) 100 UNIT/ML Solostar Pen Inject 15 Units into the skin every morning. Start at 15 units each morning-up to 50 units in the daily in the  morning (Patient taking differently: Inject 15 Units into the skin every morning. Start at 15 units each morning-up to 50 units in the daily in the  Morning,patient taking 46 units) 3 pen 3  . lisinopril (PRINIVIL,ZESTRIL) 10 MG tablet Take 1 tablet (10 mg total) by mouth daily. 90 tablet 0  .  Multiple Vitamin (MULITIVITAMIN WITH MINERALS) TABS Take 1 tablet by mouth daily.      . niacin (NIASPAN) 500 MG CR tablet TAKE 1 TABLET BY MOUTH AT BEDTIME 90 tablet 0  . ONE TOUCH ULTRA TEST test strip USE TO CHECK FASTING BLOOD SUGARS THREE TIMES DAILY 100 each 11  . potassium gluconate 595 MG TABS tablet Take 595 mg  by mouth daily.    . simvastatin (ZOCOR) 40 MG tablet TAKE 1 TABLET BY MOUTH DAILY WITH  BREAKFAST 90 tablet 0   No facility-administered medications prior to visit.     Allergies:  Allergies  Allergen Reactions  . Actos [Pioglitazone]     LE Edema--03/2014    Social History   Socioeconomic History  . Marital status: Divorced    Spouse name: Not on file  . Number of children: Not on file  . Years of education: Not on file  . Highest education level: Not on file  Occupational History  . Not on file  Social Needs  . Financial resource strain: Not on file  . Food insecurity:    Worry: Not on file    Inability: Not on file  . Transportation needs:    Medical: Not on file    Non-medical: Not on file  Tobacco Use  . Smoking status: Former Smoker    Last attempt to quit: 03/04/2003    Years since quitting: 15.1  . Smokeless tobacco: Never Used  Substance and Sexual Activity  . Alcohol use: No  . Drug use: No  . Sexual activity: Not on file  Lifestyle  . Physical activity:    Days per week: Not on file    Minutes per session: Not on file  . Stress: Not on file  Relationships  . Social connections:    Talks on phone: Not on file    Gets together: Not on file    Attends religious service: Not on file    Active member of club or organization: Not on file    Attends meetings of clubs or organizations: Not on file    Relationship status: Not on file  . Intimate partner violence:    Fear of current or ex partner: Not on file    Emotionally abused: Not on file    Physically abused: Not on file    Forced sexual activity: Not on file  Other Topics Concern  . Not on file  Social History Narrative  . Not on file    No family history on file.   Review of Systems:  See HPI for pertinent ROS. All other ROS negative.     Physical Exam: Blood pressure 122/60, pulse 80, temperature 97.8 F (36.6 C), temperature source Oral, resp. rate 16, height 5\' 8"  (1.727 m), weight  91.4 kg (201 lb 9.6 oz), SpO2 91 %., Body mass index is 30.65 kg/m. General: WNWD WM. Appears in no acute distress. Neck: Supple. No thyromegaly. No lymphadenopathy. Lungs: Clear bilaterally to auscultation without wheezes, rales, or rhonchi. Breathing is unlabored. Heart: RRR with S1 S2. No murmurs, rubs, or gallops. Musculoskeletal:  Strength and tone normal for age. Extremities/Skin: Warm and dry. No edema.  Neuro: Alert and oriented X 3. Moves all extremities spontaneously. Gait is normal. CNII-XII grossly in tact. Psych:  Responds to questions appropriately with a normal affect.     Diabetic foot exam: Inspection: On the dorsal aspect of his feet he has a lot of scaly skin. No open wounds or sores. Sensation is  intact. Plantar surface of his feet look good. No calluses or wounds. Trace left DP pulse. Trace right DP pulse. No palpable posterior tibial pulses bilaterally.      ASSESSMENT AND PLAN:  83 y.o. year old male with    1. Type 2 diabetes mellitus with complication, with long-term current use of insulin ([place])  2. Diabetic polyneuropathy associated with other specified diabetes mellitus ([place])  3. CKD (chronic kidney disease) stage 3, GFR 30-59 ml/min ([facility])  03/24/2018: 30 minutes was spent in face-to-face interaction with him  At visit 03/15/2018 labs came back showing A1c 9.5. At that time had him schedule another OV with me for a 30-minute slot to discuss starting basal insulin. Returns for that visit today. So today I did review that he did have OV with [facility] and reviewed above addendum with their information. Also today I did review that he has been having visits with physical therapy. He has no concerns to address today.  Is here to discuss insulin. 30 minutes was spent in face-to-face interaction with him discussing: --- Stop glipizide/Glucotrol. --- Check fasting blood sugar every morning and document on the log form that I have given him  today. --- Give insulin 15 units each morning. --- Scheduled follow-up visit with me on Monday at 11:00 AM.  He is to bring that blood sugar log form with him to that visit. Today I have given him a sample of Basaglar. I have explained that his insurance may cover a different brand name of insulin but that will be an equivalent. Have shown him how to use the insulin pen and how to dial to the 15 units. Written all information down on paper for him to have to refer to at home. Have answered all questions and he voices understanding. Monday he has an appointment with physical therapy at 8:45.  He will go to that and then come here for visit at 11:00.  He will follow-up sooner if needed.   03/31/2018: Today I have written down the following instructions regarding titrating up his insulin. Will increase by 5 units tomorrow morning and then will increase by 2 units/day after that. He will return for follow-up visit with me on Monday.  He has physical therapy that morning at 8 AM and is going to see me at 10 AM. He is to bring this blood sugar log form with him to that visit. Follow-up sooner if any concerns.   Thursday, 03/25/2018-----15 units------ 349 Friday, 03/26/2018----------15 units------364 Saturday, 03/27/2018----- 15 units-------372 Sunday, 03/28/2018--------15 units--------292 Monday, 03/29/2018-------- 15 units------ 327 Tuesday 03/30/2018--------15 units------- 333 Wednesday, 03/31/2018--- 15 units------ 323 Thursday, 04/01/2018--------20 units Friday, 04/02/2018-----------22 units Saturday 04/03/2018--------24 units Sunday, 04/04/2018----------26 units Monday, 04/05/2018   04/05/2018:  Thursday, 03/25/2018-----15 units------ 349 Friday, 03/26/2018----------15 units------364 Saturday, 03/27/2018----- 15 units-------372 Sunday, 03/28/2018--------15 units--------292 Monday, 03/29/2018-------- 15 units------ 327 Tuesday 03/30/2018--------15 units------- 333 Wednesday, 03/31/2018--- 15  units------ 323 Thursday, 04/01/2018--------20 units------367 Friday, 04/02/2018-----------22 units--------286 Saturday 04/03/2018--------24 units---------350 Sunday, 04/04/2018----------26 units---------353 Monday, 04/05/2018------------28 units---------362 Tuesday, 04/06/2018-----------34 units Wednesday, 04/07/2018-------36 units Thursday, 04/08/2018-----------38 units Friday 04/09/2018----------------40 units Saturday 04/10/2018-------------42 units Sunday, 04/11/2018--------------44 units  Monday, 04/12/2018-------------46 units   Increase insulin by 6 units today and then will increase by 2 units daily. He will schedule follow-up visit with me 1 week from today. On that day he has his physical therapy appointment at 930.  He will schedule his visit with me at a time that will be good for him given that appointment. If he does start getting blood sugar readings  around 150 then he will pause and stay at that amount of insulin instead of continuing to increase. Otherwise if sugars continue greater than 150 then he will titrate insulin as directed.  Thursday, 03/25/2018-----15 units------ 349 Friday, 03/26/2018----------15 units------364 Saturday, 03/27/2018----- 15 units-------372 Sunday, 03/28/2018--------15 units--------292 Monday, 03/29/2018-------- 15 units------ 327 Tuesday 03/30/2018--------15 units------- 333 Wednesday, 03/31/2018--- 15 units------ 323 Thursday, 04/01/2018--------20 units------367 Friday, 04/02/2018-----------22 units--------286 Saturday 04/03/2018--------24 units---------350 Sunday, 04/04/2018----------26 units---------353 Monday, 04/05/2018------------28 units---------362 Tuesday, 04/06/2018-----------34 units--------255 Wednesday, 04/07/2018-------36 units-------294 Thursday, 04/08/2018-----------38 units-------300 Friday 04/09/2018----------------40 units-----------234 Saturday 04/10/2018-------------42 units--------344 Sunday, 04/11/2018--------------44 units -------379 Monday,  04/12/2018-------------46 units----------290     ----------------A/P AT OV 04/12/2018:---------------------------------------------- Tuesday 04/13/2018-------------- 52 units----------- Wednesday 04/14/2018-------- 54 units----------- Thursday 04/15/2018------------56 units----------- Friday 04/16/2018---------------- 58 units------------- Saturday 04/17/2018------------- 60 units--------------- Sunday 04/18/2018---------------62 units------------- Monday 04/19/2018----------------64 units------------  If he starts getting readings down near 150------ then he is to pause and continue insulin at that amount/ dose---------- rather than continuing to increase. He voices understanding and agrees. Will schedule another follow-up visit with me 1 week from today on Monday, July 15.         THE FOLLOWING IS PULLED FORWARD FROM OV NOTE 03/15/2018---NOT ADDRESSED AT OV 03/24/2018, 03/31/2018:  12/07/2017:For him the Vania Rea is costing $45.  Celesta Gentile is costing $35. Today I checked for samples but we only have one bottle remaining of samples for the Jardiance and I have given him that.  We had no samples of the Januvia. I had discussion with him explaining that I do not have any cheaper options that we can use at this point. Discussed that our options are limited in the future if his A1c is elevated only things to consider adding would be once weekly Trulicity or another med from that class or adding Lantus. He is to check fasting blood sugars and call me with these after checking for several days. Also, I told him that in general even down the road that if he thinks he is having uncontrolled blood sugars or thinks he is having any side effects from meds etc. to call and inform me rather than adjusting medicines on his own.  He voices understanding and agrees.  This point will plan for follow-up visit in 3 months but to follow-up sooner if needed and call with blood sugar readings after med changes.  Also in  the past he has not been able to afford Invokana. Also in the past has history of lower extremity edema with Actos.  03/15/2018: Secondary to CKD and cost metformin has been discontinued and also Jardiance and Januvia.  Now is currently just on Glucotrol 10 mg.  Will check A1c and then follow-up accordingly.  He does have annual eye exam.  On ASA 81mg  QD On statin. On ACE inhibitor.  Has had Pneumovax 23 in past. Had Prevnar 13-- 12/2013.  Diabetic mononeuropathy associated with diabetes mellitus due to underlying condition ([place]) 03/15/2018---Stable/controlled with current dose of Neurontin  CKD (chronic kidney disease) stage 3, GFR 30-59 ml/min (HCC) 03/15/2018: Some diabetic medications have been discontinued secondary to CKD.  Also he is now established with Dr. Carolin Sicks at [facility].  He is now managing CKD.  Addendum added 03/18/2018: Received note from [facility].  Date of encounter 03/09/2018.  Dr. Terri Skains. His note includes the following: "HPI: This patient is an 83 year old male who presents with chronic kidney disease.  This is classified as stage 63. 83 year old white male with history of type 2 diabetes for > 35 years (A1c 7.4, as high as 8.9), hypertension,  hyperlipidemia, neuropathy, nephrolithiasis,  referred by PCP for evaluation of elevated serum creatinine level and CKD management.   Patient had serum creatinine level of 2.32 on 02/22/2018, 2.5 on 02/17/2018, 1.6---11/2017, 1.36 --- 04/2015. He uses Aleve occasionally for pain management. Denies urinary symptoms. No lower extremity edema. No history of hepatitis or HIV.  No tobacco or excessive alcohol use.  No family history of kidney disease. He is on lisinopril.  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   Serum creatinine level gradually increasing at least since 2016 with  last serum creatinine level 2.32 on 02/2018.   Check renal panel, urinalysis, spot urine PCR, hepatitis B, C, and HIV serology.   Plan to check CKD labs including anemia and bone disease as below.   US renal to evaluate kidney size and echogenicity.   Advised to avoid NSAIDs and avoid nephrotoxins.   I discussed the stages of kidney disease and the importance of better control of DM, HTN, and avoiding nephrotoxins in order to delay progression of CKD.   On KCl oral, we will follow-up today's lab." Hypertension, renal disease: Today's impression: BP suboptimal.  On lisinopril.  Advised low-salt diet and monitor BP.  Addendum note was added by Dr. Junie Panning on 03/15/18 "Kidney function is better with creatinine 1.31.   Found to have low iron and hemoglobin 10.6.  Please call in for oral iron 325 mg twice daily. Iron sat 16, serum iron 43 PTH 14, vitamin D 29.5 "    Essential hypertension 03/15/2018:  Blood pressure is at goal/controlled. He is on ACE inhibitor.Check labs to monitor.   Hypercholesterolemia 03/15/2018---He is on Statin and Niaspan. Last FLP at goal. LFTs normal. Cont current meds.  Bilateral lower extremity edema 03/15/2018---He uses HCTZ 12.5mg  PRN swelling--no longer having to use daily.    screening PSA: Normal 07/2011. 05/27/2017---Will not repeat given advanced age   screening colonoscopy: At recent office visits we have discussed that this was due.  At visit 06/2014 he says that he needs to get his back pain control prior to scheduling this. Given age 53, can probably stop these--will discuss with pt Discussed colonoscpoy with patient at visit 04/12/15. He does seem interested in wanting to have continued follow-up colonoscopies. However at today's visit he just says it to much is going on and for [place] to rediscuss it at his next visit once things settle down. Discussed visit 05/21/16 and he is agreeable for me to place order for referral to GI for follow-up  colonoscopy.---Ordered referrral to GI -- 05/21/2016 08/21/2016---Has colonoscopy scheduled this upcoming week.  11/26/2016--He reports that Dr. Kalman Shan gave him hemeOccults cards to turn in --did not have to do colonoscopy  Immunizations: Pneumonia vaccine: Received Pneumovax here 05/01/2008. Updated with Prevnar 13 12/2013.Received 06/23/2016 Tetanus: Given here 07/23/2011 Zostavax: He checked with his insurance-- cost would be $200 patient defers. Shingrix---05/27/2017----currently on back-order---will discuss in future--when available Influenza Vaccine: Given here 08/06/2015      Signed, Karis Juba, [place], [facility] 04/12/2018 11:39 AM

## 2018-04-12 NOTE — Therapy (Signed)
[facility] [address] [place], [place], [address] Phone: [phone]   Fax:  [phone]  Physical Therapy Treatment  Patient Details  Name: Christian [name] MRN: [id number] Date of Birth: 03/25/1935 Referring Provider: Dena Billet, [place]   Encounter Date: 04/12/2018  PT End of Session - 04/12/18 1024    Visit Number  6    Number of Visits  9    Date for PT Re-Evaluation  06/17/18    Authorization Type  [employer]    PT Start Time  0933    PT Stop Time  1014    PT Time Calculation (min)  41 min    Activity Tolerance  Patient tolerated treatment well No increase in pain in knee/back with gait with rollator    Behavior During Therapy  [facility] for tasks assessed/performed       Past Medical History:  Diagnosis Date  . Arthritis   . Diabetes mellitus    Type 2  . Diabetic neuropathy ([place])   . History of kidney stones   . HTN (hypertension) 03/03/2013  . Hypercholesterolemia   . 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 Skin cancer of arm 04/20/2013   left upper arm  . Spinal stenosis 05/17/2014   L3-L4   . Urgency of urination     Past Surgical History:  Procedure Laterality Date  . LUMBAR LAMINECTOMY/DECOMPRESSION MICRODISCECTOMY Bilateral 11/22/2014   Procedure: Laminectomy and foraminotomy lumbar three-lumbar four bilateral ;  Surgeon: Eustace Moore, MD;  Location: [facility];  Service: Neurosurgery;  Laterality: Bilateral;  . SKIN CANCER EXCISION Left    shoulder and hand    There were no vitals filed for this visit.  Subjective Assessment - 04/12/18 0936    Subjective  Hurting all over today-doing okay    Patient Stated Goals  "I don't know, because I get around pretty good."      Currently in Pain?  Yes    Pain Score  2     Pain Location  Knee and back    Pain Orientation  Right    Pain Descriptors / Indicators  Aching    Pain Type  Chronic pain    Pain Onset  More than a month ago    Pain Frequency   Constant    Aggravating Factors   walking    Pain Relieving Factors  sitting down alleviates pain                       [facility] Adult PT Treatment/Exercise - 04/12/18 0001      Ambulation/Gait   Ambulation/Gait  Yes    Ambulation/Gait Assistance  5: Supervision    Ambulation Distance (Feet)  400 Feet x 2, then 230 ft    Assistive device  Rollator    Gait Pattern  Step-through pattern;Trendelenburg;Poor foot clearance - left;Poor foot clearance - right    Ambulation Surface  Indoor;Outdoor;Paved    Pre-Gait Activities  Outdoor gait on sidewalk, including incline/decline with cues for posture and step length/foot clearance, followed by indoor gait with turns and maneuvering around furniture, no LOB.      Gait Comments  Pt requires occasional cues for upright posture and to keep rollator walker clos, to allow for improved foot clearance and heelstrike.  Discussed walking program for home/community (likely in [employer] or [employer], because pt repeatedly says he does not have enough room for rollator for walking inside home unless family helps to rearrange furniture.)  Self-Care   Self-Care  Other Self-Care Comments    Other Self-Care Comments   Benefits of rollator walker, instructions on rollator walker use, including locking/unlocking of brakes, maintaining posture for optimal safety gait.  Discussed how to obtain rollator walker, and PT provides patient with script from his PA for rollator; provided patient with directions to [facility] (he prefers [place] location, as he knows [place] better than [place] and will plan to go there after his MD appt today).             PT Education - 04/12/18 1023    Education Details  Benefits on use of rollator walker; provided pt with script for rollator; educated on how to obtain through medical supply store    Person(s) Educated  Patient    Methods  Explanation;Handout    Comprehension  Verbalized understanding           PT Long Term Goals - 03/19/18 1029      PT LONG TERM GOAL #1   Title  Pt will be independent with HEP for improved balance, strength, gait.  TARGET 04/16/18    Time  4    Period  Weeks    Status  New    Target Date  04/16/18      PT LONG TERM GOAL #2   Title  Pt will improve Berg Balance score to at least 40/56 for decreased fall risk.    Time  4    Period  Weeks    Status  New    Target Date  04/16/18      PT LONG TERM GOAL #3   Title  Pt will improve TUG score to less than or equal to 18 seconds for decreased fall risk.    Time  4    Period  Weeks    Target Date  04/16/18      PT LONG TERM GOAL #4   Title  Pt will improve gait velocity to at least 1.8 ft/sec with cane for improved gait efficiency and safety.    Time  4    Period  Weeks    Status  New    Target Date  04/16/18      PT LONG TERM GOAL #5   Title  Pt will verbalize understanding of fall prevention in home environment.    Time  4    Period  Weeks    Status  New    Target Date  04/16/18            Plan - 04/12/18 1024    Clinical Impression Statement  Gait training today with rollator walker, indoors, outdoors, and around furniture/tight spaces with pt requiring initial cues on use of brakes and occasional cues for posture and staying close to rollator with gait.  Initiated walking program instruction, for patient to likely use rollator in community at large stores for walking as exercise.  Educated patient in how to obtain rollator for his personal use.  Will continue to benefit from skilled PT to address gait, balance, and functional strength.    Rehab Potential  Good    PT Frequency  2x / week    PT Duration  4 weeks plus eval    PT Treatment/Interventions  ADLs/Self Care Home Management;DME Instruction;Balance training;Therapeutic exercise;Therapeutic activities;Functional mobility training;Gait training;Neuromuscular re-education;Patient/family education    PT Next Visit Plan  Review  walking program and check on rollator status, full check of OTAGO, with likely plans to discharge next  week    Consulted and Agree with Plan of Care  Patient       Patient will benefit from skilled therapeutic intervention in order to improve the following deficits and impairments:  Abnormal gait, Decreased balance, Decreased mobility, Difficulty walking, Decreased strength  Visit Diagnosis: Other abnormalities of gait and mobility  Unsteadiness on feet     Problem List Patient Active Problem List   Diagnosis Date Noted  . Diabetes mellitus type 2 with complications ([place]) 38/33/3832  . CKD (chronic kidney disease) stage 3, GFR 30-59 ml/min (HCC) 09/02/2017  . Primary osteoarthritis of right knee 06/22/2017  . S/P lumbar laminectomy 11/22/2014  . 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 Bilateral lower extremity edema 06/14/2014  . Low back pain 03/13/2014  . Bilateral pseudophakia 06/27/2013  . Diabetic neuropathy ([place])   . Skin cancer of arm 04/20/2013  . HTN (hypertension) 03/03/2013  . Hypercholesterolemia     Rebbecca Osuna W. 04/12/2018, 10:31 AM  Frazier Butt., PT   [facility] [address] [place], [place], [address] Phone: [phone]   Fax:  [phone]  Name: EDWARDS MCKELVIE MRN: [id number] Date of Birth: [birth year]

## 2018-04-13 ENCOUNTER — Other Ambulatory Visit: Payer: Self-pay | Admitting: Physician Assistant

## 2018-04-14 ENCOUNTER — Encounter: Payer: Self-pay | Admitting: Physical Therapy

## 2018-04-14 ENCOUNTER — Ambulatory Visit: Payer: PPO | Admitting: Physical Therapy

## 2018-04-14 DIAGNOSIS — R2689 Other abnormalities of gait and mobility: Secondary | ICD-10-CM

## 2018-04-14 DIAGNOSIS — R2681 Unsteadiness on feet: Secondary | ICD-10-CM | POA: Diagnosis not present

## 2018-04-14 DIAGNOSIS — M6281 Muscle weakness (generalized): Secondary | ICD-10-CM

## 2018-04-14 NOTE — Therapy (Signed)
[facility] [address] [place], [place], [address] Phone: [phone]   Fax:  [phone]  Physical Therapy Treatment  Patient Details  Name: Christian [name] MRN: [id number] Date of Birth: 06/15/1935 Referring Provider: Dena Billet, [place]   Encounter Date: 04/14/2018  PT End of Session - 04/14/18 1101    Visit Number  7    Number of Visits  9    Date for PT Re-Evaluation  06/17/18    Authorization Type  [employer]    PT Start Time  1018    PT Stop Time  1100    PT Time Calculation (min)  42 min    Activity Tolerance  Patient tolerated treatment well No increase in pain in knee/back with gait with rollator    Behavior During Therapy  [facility] for tasks assessed/performed       Past Medical History:  Diagnosis Date  . Arthritis   . Diabetes mellitus    Type 2  . Diabetic neuropathy ([place])   . History of kidney stones   . HTN (hypertension) 03/03/2013  . Hypercholesterolemia   . 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 Skin cancer of arm 04/20/2013   left upper arm  . Spinal stenosis 05/17/2014   L3-L4   . Urgency of urination     Past Surgical History:  Procedure Laterality Date  . LUMBAR LAMINECTOMY/DECOMPRESSION MICRODISCECTOMY Bilateral 11/22/2014   Procedure: Laminectomy and foraminotomy lumbar three-lumbar four bilateral ;  Surgeon: Eustace Moore, MD;  Location: [facility];  Service: Neurosurgery;  Laterality: Bilateral;  . SKIN CANCER EXCISION Left    shoulder and hand    There were no vitals filed for this visit.  Subjective Assessment - 04/14/18 1020    Subjective  Pt was able to get rollator and brought rollator to therapy today.  Feeling better than on Monday.    Patient Stated Goals  "I don't know, because I get around pretty good."      Currently in Pain?  Yes    Pain Score  4     Pain Location  Knee    Pain Orientation  Right    Pain Descriptors / Indicators  Aching    Pain Onset  More than a  month ago    Pain Frequency  Constant                       [facility] Adult PT Treatment/Exercise - 04/14/18 0001      Knee/Hip Exercises: Aerobic   Stationary Bike  L 3 seat 11, 5 min.          Balance Exercises - 04/14/18 1023      OTAGO PROGRAM   Head Movements  Sitting;5 reps    Neck Movements  Standing;Sitting    Back Extension  Sitting;5 reps    Trunk Movements  5 reps;Standing    Ankle Movements  Sitting;10 reps    Knee Extensor  10 reps seated    Knee Flexor  10 reps    Hip ABductor  20 reps    Ankle Plantorflexors  20 reps, support    Ankle Dorsiflexors  20 reps, support    Knee Bends  10 reps, support    Sideways Walking  Assistive device    Tandem Stance  10 seconds, support    One Leg Stand  10 seconds, support    Sit to Stand  10 reps, no support  PT Education - 04/14/18 1055    Education Details  Performed current HEP [place] program.    Person(s) Educated  Patient    Methods  Explanation;Handout;Verbal cues    Comprehension  Verbalized understanding          PT Long Term Goals - 03/19/18 1029      PT LONG TERM GOAL #1   Title  Pt will be independent with HEP for improved balance, strength, gait.  TARGET 04/16/18    Time  4    Period  Weeks    Status  New    Target Date  04/16/18      PT LONG TERM GOAL #2   Title  Pt will improve Berg Balance score to at least 40/56 for decreased fall risk.    Time  4    Period  Weeks    Status  New    Target Date  04/16/18      PT LONG TERM GOAL #3   Title  Pt will improve TUG score to less than or equal to 18 seconds for decreased fall risk.    Time  4    Period  Weeks    Target Date  04/16/18      PT LONG TERM GOAL #4   Title  Pt will improve gait velocity to at least 1.8 ft/sec with cane for improved gait efficiency and safety.    Time  4    Period  Weeks    Status  New    Target Date  04/16/18      PT LONG TERM GOAL #5   Title  Pt will verbalize understanding of fall  prevention in home environment.    Time  4    Period  Weeks    Status  New    Target Date  04/16/18            Plan - 04/14/18 1055    Clinical Impression Statement  Pt demonstrated good understanding of current HEP [place] program; pt continues to require UE support for balance exercises.  Pt tolerated increased resistance (L3) on Nu step today.  Pt verbalized understanding of the need to arrange home so that he can use rollator for safety and is working on a plan to remove one of his couches for room.                                          Rehab Potential  Good    PT Frequency  2x / week    PT Duration  4 weeks plus eval    PT Treatment/Interventions  ADLs/Self Care Home Management;DME Instruction;Balance training;Therapeutic exercise;Therapeutic activities;Functional mobility training;Gait training;Neuromuscular re-education;Patient/family education    PT Next Visit Plan  Start checking LTGs; likely plans to discharge next week    Consulted and Agree with Plan of Care  Patient       Patient will benefit from skilled therapeutic intervention in order to improve the following deficits and impairments:  Abnormal gait, Decreased balance, Decreased mobility, Difficulty walking, Decreased strength  Visit Diagnosis: Other abnormalities of gait and mobility  Unsteadiness on feet  Muscle weakness (generalized)     Problem List Patient Active Problem List   Diagnosis Date Noted  . Diabetes mellitus type 2 with complications ([place]) 11/91/4782  . CKD (chronic kidney disease) stage 3, GFR 30-59 ml/min (HCC) 09/02/2017  .  Primary osteoarthritis of right knee 06/22/2017  . S/P lumbar laminectomy 11/22/2014  . 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 Bilateral lower extremity edema 06/14/2014  . Low back pain 03/13/2014  . Bilateral pseudophakia 06/27/2013  . Diabetic neuropathy ([place])   . Skin cancer of arm 04/20/2013  . HTN (hypertension)  03/03/2013  . Hypercholesterolemia     Bjorn Loser, PTA  04/14/18, 12:35 PM [facility] [address] [place], [place], [address] Phone: [phone]   Fax:  [phone]  Name: LAURENCE CROFFORD MRN: [id number] Date of Birth: 05-06-1935

## 2018-04-19 ENCOUNTER — Ambulatory Visit: Payer: PPO | Admitting: Physical Therapy

## 2018-04-19 ENCOUNTER — Ambulatory Visit: Payer: PPO | Admitting: Physician Assistant

## 2018-04-19 ENCOUNTER — Encounter: Payer: Self-pay | Admitting: Physician Assistant

## 2018-04-19 ENCOUNTER — Ambulatory Visit (INDEPENDENT_AMBULATORY_CARE_PROVIDER_SITE_OTHER): Payer: PPO | Admitting: Physician Assistant

## 2018-04-19 VITALS — BP 116/58 | HR 68 | Temp 97.4°F | Resp 14 | Ht 68.0 in | Wt 203.0 lb

## 2018-04-19 DIAGNOSIS — R2689 Other abnormalities of gait and mobility: Secondary | ICD-10-CM

## 2018-04-19 DIAGNOSIS — E118 Type 2 diabetes mellitus with unspecified complications: Secondary | ICD-10-CM | POA: Diagnosis not present

## 2018-04-19 DIAGNOSIS — R2681 Unsteadiness on feet: Secondary | ICD-10-CM | POA: Diagnosis not present

## 2018-04-19 DIAGNOSIS — M6281 Muscle weakness (generalized): Secondary | ICD-10-CM

## 2018-04-19 DIAGNOSIS — Z794 Long term (current) use of insulin: Secondary | ICD-10-CM | POA: Diagnosis not present

## 2018-04-19 NOTE — Progress Notes (Addendum)
Patient ID: Christian [name] MRN: [id number], DOB: [birth year], 83 y.o. Date of Encounter: @DATE @  Chief Complaint:  Chief Complaint  Patient presents with  . discuss blood sugar readings    HPI: 83 y.o. year old male  presents for routine 3 months f/u OV to f/u DM, HTN, and other chronic medical problems.   Hypertension: 12/03/2017: Taking meds as directed. No adverse effects.  Hyperlipidemia: 12/03/2017: On Zocor 40mg  and on Niaspan. No myalgias or other adverse medication effects.  Diabetes: In the past we had difficulty getting his diabetes controlled and he was very noncompliant with diet. All of this information is detailed and documented in my note 05/21/16. See that note for details of all of the past information. 03/2014 he was having lower extremity edema so Actos was stopped and was changed to [place]. Also --have added Invokana, but unable to afford- At the time of his OV 09/02/2017---He was on:  metformin, Glucotrol, Januvia. Labs done 09/02/17 showed elevated creatinine.  At that time I said to stop metformin and to decrease Januvia to 50 mg. Today--12/03/2017--- he states that when he stopped the metformin and decreased the Januvia, that his blood sugar went up really high and his vision got blurry.   He says that that scared him so he went back on the metformin and went back on the Januvia 100 mg.  At [place] 12/03/2017: Have told him to stop the metformin and decrease the Januvia to 50 mg.  Will continue the Glucotrol XL 10 mg daily.   To try to keep his blood sugar controlled with having to stop the metformin and decrease the Januvia,  I am adding Jardiance 25 mg every morning. I will have him come in for follow-up visit on Monday to make sure he has been able to make these medicine changes and to make sure that his blood sugar is controlled.   12/07/2017: Today he reports that he has been taking medications as directed at his recent visit 12/03/17. He did stop the  metformin and he did decrease the Januvia down to 50 mg. He did continue the Glucotrol 10 mg daily. He did add the Jardiance 25 mg daily. I asked if he had some blood sugar readings for me to review.  He says " [name], I forgot to even check any blood sugars--- I was so busy adjusting these medicines." Says that the Jardiance cost $45 for him.  Says the Januvia cost him $35. He states that he is feeling fine after making these medicine adjustments.  He is having no adverse effects.       ------------------------------------------------------------------------------------------------------------------------------------------------------------------------------------------------------------------------------------------------------------------------  03/15/2018:  Today I reviewed his chart.   --Reviewed that his last routine 40-month office visit with me was 12/07/2017 which is documented above. --I reviewed that since that visit he had 2 visits with me regarding a fall at home.  The following information is copied from those visits.    02/18/2018: His daughter accompanies him for visit today. Had reviewed that his last routine visit with me was at the beginning of March and that at that visit things were fairly stable.  We had just recently adjusted some of his diabetic medications secondary to CKD.  Otherwise things were stable and from a symptomatic standpoint patient had been stable and had not been reporting any concerns or symptoms.  However, today this daughter who accompanies him today states that she has been noticing a decline with him for months.  Has noticed him  having more problems with his short-term memory.  Says that she knows that he comes in here for his visits and does not tell me that he is having any issues.  Says that he tries to appear as if things are fine and that he is handling everything fine but she does not think he is.  She states that she lives about 15 to 20  minutes from him.  Says that on average she usually sees him 1-2 times per week but she had not seen him for 2 weeks up until yesterday-- prior to yesterday had not seen him for 2 weeks.  However says that she does talk to him on the phone more often.  States that he also has 1 son that lives in [place]. States that he has another son that lives in [place].  Has these 3 children.  She reports that all 3 of them work full-time. She works 4:30 am - noon.  We discussed that she would be available in the afternoons to come with him to follow-up office visits with me.  She reports that they have tried to bring up the issue that he seems to need some assistance but that he seems to "not want to hear it ".  She says that she "does not know whether he is just too proud or just too independent."  They report that he fell yesterday morning and was on the floor for an extended amount of time.  Patient states that he did not have a phone and that he had fallen and was on the floor and he tried to crawl but could not because his leg hurt.  Says that he finally got himself to a recliner/chair that he was able to hold to and try to walk but he held to the chair and collapsed again.  Somehow he did eventually get to a phone and called his daughter.  Says that all she heard on the phone was him saying "help me, help me".  She took him to the ER and I do have chest x-ray report and lab report that was done there but then he left the ER before evaluation was complete.  Today I reviewed with patient and daughter that significant findings from the labs and x-ray from the ER show: Troponin negative. Chest x-ray showed no acute abnormality Lab was significant for elevated white count Lab also significant for significant elevation of BUN and mild elevation of creatinine consistent with dehydration.  Discussed with patient and daughter that ideally he does need to be completely evaluated and completely  treated at the emergency room/hospital.  However he refuses to go back to the hospital. Discussed with patient and daughter that my plan would be to give him IV fluids here and to evaluate because of infection/elevated white count.  They both report that he has had significant congested cough for about 2 weeks.  Daughter states that even with talking to him on the telephone that she has been able to tell that he has had a "bad cough "for about 2 weeks.  Denies any other signs or symptoms of infection.  Has had no dysuria.  No pain in his suprapubic/pelvic region.  No abdominal pain.  No vomiting or diarrhea.  He has not been having any fevers or chills.    A/P AT THAT OV----  Discussed with patient and daughter that ideally he does need to be completely evaluated and completely treated at the emergency room/hospital.  However he refuses to  go back to the hospital. Discussed with patient and daughter that my plan would be to give him IV fluids here and to evaluate cause of infection/elevated white count.  They both report that he has had significant congested cough for about 2 weeks.  Daughter states that even with talking to him on the telephone that she has been able to tell that he has had a "bad cough "for about 2 weeks.  Denies any other signs or symptoms of infection.  Has had no dysuria.  No pain in his suprapubic/pelvic region.  No abdominal pain.  No vomiting or diarrhea.  He has not been having any fevers or chills.  Discussed whether his leg still has pain whether he is having any area of pain that needs x-ray to evaluate for fracture.  He reports that there is no area of significant pain at this point and refuses need for x-ray.  UA performed today shows no UTI.  He is treated here today with 2 bags of IV Fluids.  He is given Rx for Levaquin 500mg  QD x 7 days.  He has scheduled f/u OV with me on Monday afternoon--this daughter is available to come with him to that OV.  He is to go  to ER if symptoms worsen in interim. Pt and daughter voice understanding, agree.    02/22/2018: Today pt looks much better. Says he feels a lot better too.  Reports he has been taking Levaquin as directed. Says cough is better already.  Says he has been up, walking around house some.  I discussed with pt and daughter--- whether they feel he is safe at home, can care for himself. Discussed cost of care at home, assisted living, nursing home etc. They are to discuss as a family regarding finances etc. I discussed that insurance should cover Physical Therapy and that could improve his strength and balance etc. He is agreeable to proceed with PT.  Daughter requests medication for him to use for allergy symptoms that is safe to use with his other meds--says he has had some sneezing, rhinorrhea. AT THAT OV: Placed order for physical therapy Obtained follow-up CBC and BMET. Also daughter plan to get life alert for patient to have available to call if needs help.    03/15/2018: Today I asked patient about physical therapy.  He states that he never heard anything from them.  He states that he does not have any type of voicemail or answering machine. He states that he has had no further falls or near falls. He does now have a life alert and is wearing this today. While he was here in the office during his visit I spoke to referral staff and they got his appointment scheduled with physical therapy and told him date and time for him to go for physical therapy as I was concerned he would not get that via phone message.  That is scheduled during today's visit. Today he reports that he did go to the kidney doctor.  He has paperwork and it is Dr. Noel Journey at [facility]. The paperwork indicates that they were ordering labs and kidney ultrasound.  States that he does have follow-up appointment scheduled to go back to them for follow-up.  He has no other updates today.  Has no specific  concerns to address today.  Today he reports that he has been taking medications as directed.  He is taking the Glucotrol 10 mg daily.  He did not bring in any blood sugar  readings for me to review. He is taking the simvastatin and Niaspan.  Also still taking the gabapentin for neuropathy.     Addendum added 03/18/2018: Received note from [place].  Date of encounter 03/09/2018.  Dr. Terri Skains. His note includes the following: "HPI: This patient is an 83 year old male who presents with chronic kidney disease.  This is classified as stage 41. 83 year old white male with history of type 2 diabetes for > 35 years (A1c 7.4, as high as 8.9), hypertension, hyperlipidemia, neuropathy, nephrolithiasis,  referred by PCP for evaluation of elevated serum creatinine level and CKD management.   Patient had serum creatinine level of 2.32 on 02/22/2018, 2.5 on 02/17/2018, 1.6---11/2017, 1.36 --- 04/2015. He uses Aleve occasionally for pain management. Denies urinary symptoms. No lower extremity edema. No history of hepatitis or HIV.  No tobacco or excessive alcohol use.  No family history of kidney disease. He is on lisinopril.  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   Serum creatinine level gradually increasing at least since 2016 with last serum creatinine level 2.32 on 02/2018.   Check renal panel, urinalysis, spot urine PCR, hepatitis B, C, and HIV serology.   Plan to check CKD labs including anemia and bone disease as below.   US renal to evaluate kidney size and echogenicity.   Advised to avoid NSAIDs and avoid nephrotoxins.   I discussed the stages of kidney disease and the importance of better control of DM, HTN, and avoiding nephrotoxins in order to delay progression of CKD.   On KCl oral, we will follow-up today's lab." Hypertension, renal disease: Today's  impression: BP suboptimal.  On lisinopril.  Advised low-salt diet and monitor BP.  Addendum note was added by Dr. Junie Panning on 03/15/18 "Kidney function is better with creatinine 1.31.   Found to have low iron and hemoglobin 10.6.  Please call in for oral iron 325 mg twice daily. Iron sat 16, serum iron 43 PTH 14, vitamin D 29.5 "   03/24/2018: At visit 03/15/2018 labs came back showing A1c 9.5. At that time had him schedule another OV with me for a 30-minute slot to discuss starting basal insulin. Returns for that visit today. So today I did review that he did have OV with [facility] and reviewed above addendum with their information. Also today I did review that he has been having visits with physical therapy. He has no concerns to address today.  Is here to discuss insulin. 30 minutes was spent in face-to-face interaction with him discussing: --- Stop glipizide/Glucotrol. --- Check fasting blood sugar every morning and document on the log form that I have given him today. --- Give insulin 15 units each morning. --- Scheduled follow-up visit with me on Monday at 11:00 AM.  He is to bring that blood sugar log form with him to that visit. Today I have given him a sample of Basaglar. I have explained that his insurance may cover a different brand name of insulin but that will be an equivalent. Have shown him how to use the insulin pen and how to dial to the 15 units. Written all information down on paper for him to have to refer to at home. Have answered all questions and he voices understanding.   03/31/2018: Today he reports that he did stop the Glucotrol/glipizide. Today he reports that he has been given the insulin at 15 units each  morning. Today he does bring in his blood sugar log form. He has been getting the following fasting morning readings: 349, 364, 372, 292, 327, 333, 323  He has no other concerns to address today.     04/05/2018: He brings in blood sugar  log form. Has been administering insulin as directed. Unfortunately, his blood sugar is still elevated and really has not come down much. However, I do think that he is his blood sugar meter is reading correctly and that his sugar may still be high because his A1c was at 9.5.  Ever it is that his sugars are reading similar to what they were a week ago on basically half the amount of insulin that he is on now and he says that his diet really has not changed over the past week. Nonetheless his most reading recent readings are: Wednesday 6/26------15 units----- 323 Thursday 6/27---------20 units -----367 Friday 6/28--------------22 units------286 A 6/29----------------------24 units------ 350 Sunday 6/30-------------26 units------- 353 Monday 7/1-----------28 units----------- 362   04/12/2018:  He brings in blood sugar log form. He has been administering insulin as directed. No other concerns to address today. Thursday, 03/25/2018-----15 units------ 349 Friday, 03/26/2018----------15 units------364 Saturday, 03/27/2018----- 15 units-------372 Sunday, 03/28/2018--------15 units--------292 Monday, 03/29/2018-------- 15 units------ 327 Tuesday 03/30/2018--------15 units------- 333 Wednesday, 03/31/2018--- 15 units------ 323 Thursday, 04/01/2018--------20 units------367 Friday, 04/02/2018-----------22 units--------286 Saturday 04/03/2018--------24 units---------350 Sunday, 04/04/2018----------26 units---------353 Monday, 04/05/2018------------28 units---------362 Tuesday, 04/06/2018-----------34 units--------255 Wednesday, 04/07/2018-------36 units-------294 Thursday, 04/08/2018-----------38 units-------300 Friday 04/09/2018----------------40 units-----------234 Saturday 04/10/2018-------------42 units--------344 Sunday, 04/11/2018--------------44 units -------379 Monday, 04/12/2018-------------46 units----------290   04/19/2018: Tuesday 04/13/2018-------------- 52 units-----------265 Wednesday 04/14/2018-------- 54  units-----------190 Thursday 04/15/2018------------56 units-----------169 Friday 04/16/2018---------------- 58 units-------------194 Saturday 04/17/2018------------- 60 units---------------227 Sunday 04/18/2018---------------62 units---continued at 60 units----------227 Monday 04/19/2018----------------64 units--continued at 60 units----------184  He brings in blood sugar log with readings documented as above.  He reports that the for the last several days he has kept it at 60 units daily.   Says that 1 of those days during the day he got reading of 110 so that is when he decided to keep it at 60 units.  Today he says that he thinks he knows what got his sugars so high.  Says that he "thinks he was eating the wrong things." Also says that he has had some issues with trying to cook on the stove.  Says that if he does not stand right there next to it then he forgets about it and ends up burning his food.  Says yesterday he was trying to cook him some peas and green beans and ended up burning them. Asked what type of foods he is eating. Asked what he ate yesterday. That is when he says that he tried to cook the peas and the green beans but they ended up burning.  Then went to [employer] and ate pork chop cabbage and green beans.  Did not eat any of their biscuits but did have one cornbread.  Asked what he ate the prior day and he could not really remember exactly.  I asked what a typical day is like. That a lot of times he ends up waking up around 2 in the morning and eventually will eat some Cheerios and then go back to bed around 8 AM and sleeps till around 11.  Says that if it is a week day then he will go to [employer] and usually will get vegetables they are.  Asked what he will then eat for dinner.  Says that he usually tries to eat some vegetables mostly.  Reminded him that in the past at one point he told me he was not eating the right foods and then he made significant improvement in his  diet and was extremely compliant.  Reviewed with him that I was not aware of that he had slipped back into his old habits.  Today I discussed he says that he does have the microwave so I have recommended he get some foods to cook in the microwave as that would be safer and to avoid using the oven and stove. Also discussed healthy food options. continue the insulin at 60 units daily and he will wait 1 month to return for follow-up visit.  Will bring blood sugar readings to that visit.  He will follow-up sooner if any concerns.   Addendum Added 05/17/2018: Received note from [facility].  Their note is dated 05/11/2018. States that serum creatinine level is improved to 1.31 on 03/09/2018. Renal unremarkable except renal cyst. Continue lisinopril. BP acceptable.  Advise better glycemic control. Repeat renal panel today.  Renal function panel from 05/11/2018 is included.  BUN 30 creatinine 1.41.  GFR for non-African American 46. Plan for follow-up visit 3 months. No changes in meds. Also noted anemia associated with chronic renal failure.  Reviewed iron and hemoglobin from 03/09/2018.  Plan: continue oral iron. Reviewed vitamin D deficiency.  Reviewed PTH and vitamin D levels from 03/09/2018.  Continue oral vitamin D.     Past Medical History:  Diagnosis Date  . Arthritis   . Diabetes mellitus    Type 2  . Diabetic neuropathy ([place])   . History of kidney stones   . HTN (hypertension) 03/03/2013  . Hypercholesterolemia   . 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 Skin cancer of arm 04/20/2013   left upper arm  . Spinal stenosis 05/17/2014   L3-L4   . Urgency of urination      Home Meds: Outpatient Medications Prior to Visit  Medication Sig Dispense Refill  . acetaminophen (TYLENOL) 500 MG tablet Take 1,000 mg by mouth every 8 (eight) hours as needed for mild pain or moderate pain (back pain).    Marland Kitchen aspirin EC 81 MG tablet Take 81 mg by mouth daily.      . Cetirizine HCl (ZYRTEC  ALLERGY) 10 MG CAPS Take once daily as needed for allergy symptoms. (May cause drowsiness) 30 capsule 5  . cholecalciferol (VITAMIN D) 1000 UNITS tablet Take 1,000 Units by mouth daily.      . Cyanocobalamin (B-12 PO) Take 1 tablet by mouth daily.    . fish oil-omega-3 fatty acids 1000 MG capsule Take 3 g by mouth 2 (two) times daily.      Marland Kitchen gabapentin (NEURONTIN) 600 MG tablet TAKE 1 TABLET BY MOUTH THREE TIMES DAILY 90 tablet 3  . HYDROcodone-acetaminophen (NORCO) 7.5-325 MG tablet Take 1 tablet by mouth every 6 (six) hours as needed for moderate pain. 30 tablet 0  . Insulin Glargine (LANTUS SOLOSTAR) 100 UNIT/ML Solostar Pen Inject 15 Units into the skin every morning. Start at 15 units each morning-up to 50 units in the daily in the  morning (Patient taking differently: Inject 15 Units into the skin every morning. Start at 15 units each morning-up to 50 units in the daily in the  Morning,patient taking 60 units) 3 pen 3  . lisinopril (PRINIVIL,ZESTRIL) 10 MG tablet Take 1 tablet (10 mg total) by mouth daily. 90 tablet 0  . Multiple Vitamin (MULITIVITAMIN WITH MINERALS) TABS Take 1 tablet by mouth daily.      Marland Kitchen  niacin (NIASPAN) 500 MG CR tablet TAKE 1 TABLET BY MOUTH AT BEDTIME 90 tablet 0  . ONE TOUCH ULTRA TEST test strip USE TO CHECK FASTING BLOOD SUGARS THREE TIMES DAILY 100 each 11  . potassium gluconate 595 MG TABS tablet Take 595 mg by mouth daily.    . simvastatin (ZOCOR) 40 MG tablet TAKE 1 TABLET BY MOUTH ONCE DAILY WITH BREAKFAST 90 tablet 0   No facility-administered medications prior to visit.     Allergies:  Allergies  Allergen Reactions  . Actos [Pioglitazone]     LE Edema--03/2014    Social History   Socioeconomic History  . Marital status: Divorced    Spouse name: Not on file  . Number of children: Not on file  . Years of education: Not on file  . Highest education level: Not on file  Occupational History  . Not on file  Social Needs  . Financial resource strain:  Not on file  . Food insecurity:    Worry: Not on file    Inability: Not on file  . Transportation needs:    Medical: Not on file    Non-medical: Not on file  Tobacco Use  . Smoking status: Former Smoker    Last attempt to quit: 03/04/2003    Years since quitting: 15.1  . Smokeless tobacco: Never Used  Substance and Sexual Activity  . Alcohol use: No  . Drug use: No  . Sexual activity: Not on file  Lifestyle  . Physical activity:    Days per week: Not on file    Minutes per session: Not on file  . Stress: Not on file  Relationships  . Social connections:    Talks on phone: Not on file    Gets together: Not on file    Attends religious service: Not on file    Active member of club or organization: Not on file    Attends meetings of clubs or organizations: Not on file    Relationship status: Not on file  . Intimate partner violence:    Fear of current or ex partner: Not on file    Emotionally abused: Not on file    Physically abused: Not on file    Forced sexual activity: Not on file  Other Topics Concern  . Not on file  Social History Narrative  . Not on file    History reviewed. No pertinent family history.   Review of Systems:  See HPI for pertinent ROS. All other ROS negative.     Physical Exam: Blood pressure (!) 116/58, pulse 68, temperature (!) 97.4 F (36.3 C), temperature source Oral, resp. rate 14, height 5\' 8"  (1.727 m), weight 92.1 kg (203 lb), SpO2 98 %., Body mass index is 30.87 kg/m. General: WNWD WM. Appears in no acute distress. Neck: Supple. No thyromegaly. No lymphadenopathy. Lungs: Clear bilaterally to auscultation without wheezes, rales, or rhonchi. Breathing is unlabored. Heart: RRR with S1 S2. No murmurs, rubs, or gallops. Musculoskeletal:  Strength and tone normal for age. Extremities/Skin: Warm and dry. No edema.  Neuro: Alert and oriented X 3. Moves all extremities spontaneously. Gait is normal. CNII-XII grossly in tact. Psych:  Responds  to questions appropriately with a normal affect.     Diabetic foot exam: Inspection: On the dorsal aspect of his feet he has a lot of scaly skin. No open wounds or sores. Sensation is intact. Plantar surface of his feet look good. No calluses or wounds. Trace left DP pulse. Trace  right DP pulse. No palpable posterior tibial pulses bilaterally.      ASSESSMENT AND PLAN:  83 y.o. year old male with    1. Type 2 diabetes mellitus with complication, with long-term current use of insulin ([place])  2. Diabetic polyneuropathy associated with other specified diabetes mellitus ([place])  04/19/2018: Today I discussed he says that he does have the microwave so I have recommended he get some foods to cook in the microwave as that would be safer and to avoid using the oven and stove. Also discussed healthy food options. continue the insulin at 60 units daily and he will wait 1 month to return for follow-up visit.  Will bring blood sugar readings to that visit.  He will follow-up sooner if any concerns.           THE FOLLOWING IS PULLED FORWARD FROM OV NOTE 03/15/2018---NOT ADDRESSED AT OV 03/24/2018, 03/31/2018, 04/19/2018:  12/07/2017:For him the Vania Rea is costing $45.  Celesta Gentile is costing $35. Today I checked for samples but we only have one bottle remaining of samples for the Jardiance and I have given him that.  We had no samples of the Januvia. I had discussion with him explaining that I do not have any cheaper options that we can use at this point. Discussed that our options are limited in the future if his A1c is elevated only things to consider adding would be once weekly Trulicity or another med from that class or adding Lantus. He is to check fasting blood sugars and call me with these after checking for several days. Also, I told him that in general even down the road that if he thinks he is having uncontrolled blood sugars or thinks he is having any side effects from meds etc. to call and  inform me rather than adjusting medicines on his own.  He voices understanding and agrees.  This point will plan for follow-up visit in 3 months but to follow-up sooner if needed and call with blood sugar readings after med changes.  Also in the past he has not been able to afford Invokana. Also in the past has history of lower extremity edema with Actos.  03/15/2018: Secondary to CKD and cost metformin has been discontinued and also Jardiance and Januvia.  Now is currently just on Glucotrol 10 mg.  Will check A1c and then follow-up accordingly.  He does have annual eye exam.  On ASA 81mg  QD On statin. On ACE inhibitor.  Has had Pneumovax 23 in past. Had Prevnar 13-- 12/2013.  Diabetic mononeuropathy associated with diabetes mellitus due to underlying condition ([place]) 03/15/2018---Stable/controlled with current dose of Neurontin  CKD (chronic kidney disease) stage 3, GFR 30-59 ml/min (HCC) 03/15/2018: Some diabetic medications have been discontinued secondary to CKD.  Also he is now established with Dr. Carolin Sicks at [facility].  He is now managing CKD.  Addendum added 03/18/2018: Received note from [place].  Date of encounter 03/09/2018.  Dr. Terri Skains. His note includes the following: "HPI: This patient is an 83 year old male who presents with chronic kidney disease.  This is classified as stage 53. 83 year old white male with history of type 2 diabetes for > 35 years (A1c 7.4, as high as 8.9), hypertension, hyperlipidemia, neuropathy, nephrolithiasis,  referred by PCP for evaluation of elevated serum creatinine level and CKD management.   Patient had serum creatinine level of 2.32 on 02/22/2018, 2.5 on 02/17/2018, 1.6---11/2017, 1.36 --- 04/2015. He uses Aleve occasionally for pain management. Denies urinary  symptoms. No lower extremity edema. No history of hepatitis or HIV.  No tobacco or excessive alcohol use.  No family history of kidney disease. He is  on lisinopril.  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   Serum creatinine level gradually increasing at least since 2016 with last serum creatinine level 2.32 on 02/2018.   Check renal panel, urinalysis, spot urine PCR, hepatitis B, C, and HIV serology.   Plan to check CKD labs including anemia and bone disease as below.   US renal to evaluate kidney size and echogenicity.   Advised to avoid NSAIDs and avoid nephrotoxins.   I discussed the stages of kidney disease and the importance of better control of DM, HTN, and avoiding nephrotoxins in order to delay progression of CKD.   On KCl oral, we will follow-up today's lab." Hypertension, renal disease: Today's impression: BP suboptimal.  On lisinopril.  Advised low-salt diet and monitor BP.  Addendum note was added by Dr. Junie Panning on 03/15/18 "Kidney function is better with creatinine 1.31.   Found to have low iron and hemoglobin 10.6.  Please call in for oral iron 325 mg twice daily. Iron sat 16, serum iron 43 PTH 14, vitamin D 29.5 "  Addendum Added 05/17/2018: Received note from [place].  Their note is dated 05/11/2018. States that serum creatinine level is improved to 1.31 on 03/09/2018. Renal unremarkable except renal cyst. Continue lisinopril. BP acceptable.  Advise better glycemic control. Repeat renal panel today.  Renal function panel from 05/11/2018 is included.  BUN 30 creatinine 1.41.  GFR for non-African American 46. Plan for follow-up visit 3 months. No changes in meds. Also noted anemia associated with chronic renal failure.  Reviewed iron and hemoglobin from 03/09/2018.  Plan: continue oral iron. Reviewed vitamin D deficiency.  Reviewed PTH and vitamin D levels from 03/09/2018.  Continue oral vitamin D.    Essential hypertension 03/15/2018:  Blood pressure is at goal/controlled. He is on ACE  inhibitor.Check labs to monitor.   Hypercholesterolemia 03/15/2018---He is on Statin and Niaspan. Last FLP at goal. LFTs normal. Cont current meds.  Bilateral lower extremity edema 03/15/2018---He uses HCTZ 12.5mg  PRN swelling--no longer having to use daily.    screening PSA: Normal 07/2011. 05/27/2017---Will not repeat given advanced age   screening colonoscopy: At recent office visits we have discussed that this was due.  At visit 06/2014 he says that he needs to get his back pain control prior to scheduling this. Given age 59, can probably stop these--will discuss with pt Discussed colonoscpoy with patient at visit 04/12/15. He does seem interested in wanting to have continued follow-up colonoscopies. However at today's visit he just says it to much is going on and for [place] to rediscuss it at his next visit once things settle down. Discussed visit 05/21/16 and he is agreeable for me to place order for referral to GI for follow-up colonoscopy.---Ordered referrral to GI -- 05/21/2016 08/21/2016---Has colonoscopy scheduled this upcoming week.  11/26/2016--He reports that Dr. Kalman Shan gave him hemeOccults cards to turn in --did not have to do colonoscopy  Immunizations: Pneumonia vaccine: Received Pneumovax here 05/01/2008. Updated with Prevnar 13 12/2013.Received 06/23/2016 Tetanus: Given here 07/23/2011 Zostavax: He checked with his insurance-- cost would be $200 patient defers. Shingrix---05/27/2017----currently on back-order---will discuss in future--when available Influenza Vaccine: Given here 08/06/2015      Signed, Karis Juba, [place], [facility] 04/19/2018 2:34 PM

## 2018-04-20 NOTE — Therapy (Signed)
[facility] [address] [place], [place], [address] Phone: [phone]   Fax:  [phone]  Physical Therapy Treatment/Discharge Summary  Patient Details  Name: Christian [name] MRN: [id number] Date of Birth: October 02, 1935 Referring Provider: Dena Billet, [place]   Encounter Date: 04/19/2018  PT End of Session - 04/20/18 1324    Visit Number  8    Number of Visits  9    Date for PT Re-Evaluation  06/17/18    Authorization Type  [employer]    PT Start Time  1020    PT Stop Time  1049 d/c visit; goals checked and pt discharged    PT Time Calculation (min)  29 min    Activity Tolerance  Patient tolerated treatment well No increase in pain in knee/back with gait with rollator    Behavior During Therapy  [facility] for tasks assessed/performed       Past Medical History:  Diagnosis Date  . Arthritis   . Diabetes mellitus    Type 2  . Diabetic neuropathy ([place])   . History of kidney stones   . HTN (hypertension) 03/03/2013  . Hypercholesterolemia   . 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 Skin cancer of arm 04/20/2013   left upper arm  . Spinal stenosis 05/17/2014   L3-L4   . Urgency of urination     Past Surgical History:  Procedure Laterality Date  . LUMBAR LAMINECTOMY/DECOMPRESSION MICRODISCECTOMY Bilateral 11/22/2014   Procedure: Laminectomy and foraminotomy lumbar three-lumbar four bilateral ;  Surgeon: Eustace Moore, MD;  Location: [facility];  Service: Neurosurgery;  Laterality: Bilateral;  . SKIN CANCER EXCISION Left    shoulder and hand    There were no vitals filed for this visit.  Subjective Assessment - 04/19/18 1023    Subjective  Like using the rollator    Patient Stated Goals  "I don't know, because I get around pretty good."      Currently in Pain?  Yes    Pain Score  5     Pain Location  Knee    Pain Orientation  Right    Pain Descriptors / Indicators  Aching    Pain Type  Chronic pain    Pain  Onset  More than a month ago    Pain Frequency  Constant    Aggravating Factors   walking    Pain Relieving Factors  pain medication                       [facility] Adult PT Treatment/Exercise - 04/20/18 1320      Ambulation/Gait   Ambulation/Gait  Yes    Ambulation/Gait Assistance  6: Modified independent (Device/Increase time)    Ambulation Distance (Feet)  400 Feet    Assistive device  Rollator    Gait Pattern  Step-through pattern;Trendelenburg    Ambulation Surface  Level;Indoor    Gait velocity  16.81 sec = 1.95 ft/sec      Standardized Balance Assessment   Standardized Balance Assessment  Timed Up and Go Test;Berg Balance Test      Berg Balance Test   Sit to Stand  Able to stand without using hands and stabilize independently    Standing Unsupported  Able to stand safely 2 minutes    Sitting with Back Unsupported but Feet Supported on Floor or Stool  Able to sit safely and securely 2 minutes    Stand to Sit  Sits safely with minimal  use of hands    Transfers  Able to transfer safely, minor use of hands    Standing Unsupported with Eyes Closed  Able to stand 10 seconds with supervision    Standing Ubsupported with Feet Together  Needs help to attain position but able to stand for 30 seconds with feet together    From Standing, Reach Forward with Outstretched Arm  Reaches forward but needs supervision    From Standing Position, Pick up Object from Floor  Unable to try/needs assist to keep balance    From Standing Position, Turn to Look Behind Over each Shoulder  Turn sideways only but maintains balance    Turn 360 Degrees  Needs assistance while turning    Standing Unsupported, Alternately Place Feet on Step/Stool  Needs assistance to keep from falling or unable to try    Standing Unsupported, One Foot in [place] to take small step independently and hold 30 seconds    Standing on One Leg  Unable to try or needs assist to prevent fall    Total Score  29       Timed Up and Go Test   TUG  Normal TUG    Normal TUG (seconds)  20.53      Self-Care   Self-Care  Other Self-Care Comments    Other Self-Care Comments   Reviewed fall prevention education-pt verbalizes understanding; discussed POC and progress towards goals.  Discussed d/c from PT today, with pt agreeable to d/c.       Gait:  Reviewed locking/unlocking of brakes of rollator, proper way to use for turns and backing up to sit.      PT Education - 04/20/18 1324    Education Details  Review of HEP, progress towards goals and POC    Person(s) Educated  Patient    Methods  Explanation    Comprehension  Verbalized understanding          PT Long Term Goals - 04/19/18 1045      PT LONG TERM GOAL #1   Title  Pt will be independent with HEP for improved balance, strength, gait.  TARGET 04/16/18    Time  4    Period  Weeks    Status  Achieved      PT LONG TERM GOAL #2   Title  Pt will improve Berg Balance score to at least 40/56 for decreased fall risk.    Time  4    Period  Weeks    Status  Not Met      PT LONG TERM GOAL #3   Title  Pt will improve TUG score to less than or equal to 18 seconds for decreased fall risk.    Time  4    Period  Weeks    Status  Partially Met      PT LONG TERM GOAL #4   Title  Pt will improve gait velocity to at least 1.8 ft/sec with cane for improved gait efficiency and safety.    Time  4    Period  Weeks    Status  Partially Met      PT LONG TERM GOAL #5   Title  Pt will verbalize understanding of fall prevention in home environment.    Time  4    Period  Weeks    Status  Achieved            Plan - 04/20/18 1325    Clinical Impression Statement  Assessed  LTGs this visit, with pt meeting LTG for HEP and LTG 5 for fall prevention.  LTG 2 not met, with Berg score decreased to 29/56 (likely due to increased R knee pain); LTG 3 and 4 partially met, with improvements in TUG score and improved gait velocity score, with rollator.  Pt is  pleased with being able to use rollator for improved stability with gait and he is appropriate for d/c from PT at this time.    Rehab Potential  Good    PT Frequency  2x / week    PT Duration  4 weeks plus one additional visit 04/19/18 for goal check/ and discharge    PT Treatment/Interventions  ADLs/Self Care Home Management;DME Instruction;Balance training;Therapeutic exercise;Therapeutic activities;Functional mobility training;Gait training;Neuromuscular re-education;Patient/family education    PT Next Visit Plan  D/C PT this visit    Consulted and Agree with Plan of Care  Patient       Patient will benefit from skilled therapeutic intervention in order to improve the following deficits and impairments:  Abnormal gait, Decreased balance, Decreased mobility, Difficulty walking, Decreased strength  Visit Diagnosis: Other abnormalities of gait and mobility  Unsteadiness on feet  Muscle weakness (generalized)     Problem List Patient Active Problem List   Diagnosis Date Noted  . Diabetes mellitus type 2 with complications ([place]) 16/07/9603  . CKD (chronic kidney disease) stage 3, GFR 30-59 ml/min (HCC) 09/02/2017  . Primary osteoarthritis of right knee 06/22/2017  . S/P lumbar laminectomy 11/22/2014  . 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 Bilateral lower extremity edema 06/14/2014  . Low back pain 03/13/2014  . Bilateral pseudophakia 06/27/2013  . Diabetic neuropathy ([place])   . Skin cancer of arm 04/20/2013  . HTN (hypertension) 03/03/2013  . Hypercholesterolemia     Thaer Miyoshi W. 04/20/2018, 1:29 PM Frazier Butt., PT  [facility] [address] [place], [place], [address] Phone: [phone]   Fax:  [phone]  Name: REILY ILIC MRN: [id number] Date of Birth: 11-24-1934   PHYSICAL THERAPY DISCHARGE SUMMARY  Visits from Start of Care: 8  Current functional level  related to goals / functional outcomes: PT Long Term Goals - 04/19/18 1045      PT LONG TERM GOAL #1   Title  Pt will be independent with HEP for improved balance, strength, gait.  TARGET 04/16/18    Time  4    Period  Weeks    Status  Achieved      PT LONG TERM GOAL #2   Title  Pt will improve Berg Balance score to at least 40/56 for decreased fall risk.    Time  4    Period  Weeks    Status  Not Met      PT LONG TERM GOAL #3   Title  Pt will improve TUG score to less than or equal to 18 seconds for decreased fall risk.    Time  4    Period  Weeks    Status  Partially Met      PT LONG TERM GOAL #4   Title  Pt will improve gait velocity to at least 1.8 ft/sec with cane for improved gait efficiency and safety.    Time  4    Period  Weeks    Status  Partially Met      PT LONG TERM GOAL #5   Title  Pt will verbalize understanding of fall prevention in home environment.  Time  4    Period  Weeks    Status  Achieved      Pt has met 2 LTGs and has partially met 2 LTGs.  LTG for Berg Balance score not met.   Remaining deficits: Balance, R knee pain is significantly limiting safe, pain-free ambulation (he reports he is being followed for this, likely to have knee replacement in the fall of this year.)   Education / Equipment: Educated in [employer], fall prevention, use of rollator walker.  Plan: Patient agrees to discharge.  Patient goals were partially met. Patient is being discharged due to being pleased with the current functional level.  ?????   Mady Haagensen, PT 04/20/18 1:37 PM Phone: [phone] Fax: [phone]

## 2018-04-21 ENCOUNTER — Ambulatory Visit: Payer: Self-pay | Admitting: Physical Therapy

## 2018-04-30 ENCOUNTER — Other Ambulatory Visit: Payer: Self-pay | Admitting: Physician Assistant

## 2018-04-30 NOTE — Telephone Encounter (Signed)
Refill on hydrocodone to [facility].  °

## 2018-04-30 NOTE — Telephone Encounter (Signed)
Last OV 04/19/2018 Last refill 02/15/2018 Ok to refill?

## 2018-05-03 MED ORDER — HYDROCODONE-ACETAMINOPHEN 7.5-325 MG PO TABS: 1.0000 | ORAL_TABLET | Freq: Four times a day (QID) | ORAL | 0 refills | Status: DC | PRN

## 2018-05-07 ENCOUNTER — Other Ambulatory Visit: Payer: Self-pay | Admitting: Physician Assistant

## 2018-05-11 DIAGNOSIS — N200 Calculus of kidney: Secondary | ICD-10-CM | POA: Diagnosis not present

## 2018-05-11 DIAGNOSIS — I129 Hypertensive chronic kidney disease with stage 1 through stage 4 chronic kidney disease, or unspecified chronic kidney disease: Secondary | ICD-10-CM | POA: Diagnosis not present

## 2018-05-11 DIAGNOSIS — E559 Vitamin D deficiency, unspecified: Secondary | ICD-10-CM | POA: Diagnosis not present

## 2018-05-11 DIAGNOSIS — N183 Chronic kidney disease, stage 3 (moderate): Secondary | ICD-10-CM | POA: Diagnosis not present

## 2018-05-11 DIAGNOSIS — D631 Anemia in chronic kidney disease: Secondary | ICD-10-CM | POA: Diagnosis not present

## 2018-05-14 ENCOUNTER — Telehealth: Payer: Self-pay

## 2018-05-14 MED ORDER — INSULIN PEN NEEDLE 32G X 6 MM MISC: 3 refills | Status: AC

## 2018-05-14 NOTE — Telephone Encounter (Signed)
Patient called and left a message requesting needles for his insulin pen.Patient is aware

## 2018-05-19 ENCOUNTER — Ambulatory Visit (INDEPENDENT_AMBULATORY_CARE_PROVIDER_SITE_OTHER): Payer: PPO | Admitting: Physician Assistant

## 2018-05-19 ENCOUNTER — Ambulatory Visit: Payer: PPO | Admitting: Physician Assistant

## 2018-05-19 ENCOUNTER — Encounter: Payer: Self-pay | Admitting: Physician Assistant

## 2018-05-19 VITALS — BP 116/50 | HR 63 | Temp 97.6°F | Resp 14 | Ht 68.0 in | Wt 200.8 lb

## 2018-05-19 DIAGNOSIS — E118 Type 2 diabetes mellitus with unspecified complications: Secondary | ICD-10-CM | POA: Diagnosis not present

## 2018-05-19 DIAGNOSIS — Z794 Long term (current) use of insulin: Secondary | ICD-10-CM

## 2018-05-19 DIAGNOSIS — I10 Essential (primary) hypertension: Secondary | ICD-10-CM | POA: Diagnosis not present

## 2018-05-19 MED ORDER — INSULIN GLARGINE 100 UNIT/ML ~~LOC~~ SOLN: 64.0000 [IU] | Freq: Every day | SUBCUTANEOUS | 11 refills | Status: DC

## 2018-05-19 NOTE — Progress Notes (Signed)
Patient ID: Christian [name] MRN: [id number], DOB: 08/15/1935, 83 y.o. Date of Encounter: _0 @  Chief Complaint:  Chief Complaint  Patient presents with  . Diabetes  . Hypertension    HPI: 83 y.o. year old male  presents for routine 3 months f/u OV to f/u DM, HTN, and other chronic medical problems.   Hypertension: 12/03/2017: Taking meds as directed. No adverse effects.  Hyperlipidemia: 12/03/2017: On Zocor 28m and on Niaspan. No myalgias or other adverse medication effects.  Diabetes: In the past we had difficulty getting his diabetes controlled and he was very noncompliant with diet. All of this information is detailed and documented in my note 05/21/16. See that note for details of all of the past information. 03/2014 he was having lower extremity edema so Actos was stopped and was changed to JAlex Also --have added Invokana, but unable to afford- At the time of his OV 09/02/2017---He was on:  metformin, Glucotrol, Januvia. Labs done 09/02/17 showed elevated creatinine.  At that time I said to stop metformin and to decrease Januvia to 50 mg. Today--12/03/2017--- he states that when he stopped the metformin and decreased the Januvia, that his blood sugar went up really high and his vision got blurry.   He says that that scared him so he went back on the metformin and went back on the Januvia 100 mg.  At [place]/28/2019: Have told him to stop the metformin and decrease the Januvia to 50 mg.  Will continue the Glucotrol XL 10 mg daily.   To try to keep his blood sugar controlled with having to stop the metformin and decrease the Januvia,  I am adding Jardiance 25 mg every morning. I will have him come in for follow-up visit on Monday to make sure he has been able to make these medicine changes and to make sure that his blood sugar is controlled.   12/07/2017: Today he reports that he has been taking medications as directed at his recent visit 12/03/17. He did stop the metformin  and he did decrease the Januvia down to 50 mg. He did continue the Glucotrol 10 mg daily. He did add the Jardiance 25 mg daily. I asked if he had some blood sugar readings for me to review.  He says " OPark Meo I forgot to even check any blood sugars--- I was so busy adjusting these medicines." Says that the Jardiance cost $45 for him.  Says the Januvia cost him $35. He states that he is feeling fine after making these medicine adjustments.  He is having no adverse effects.       ------------------------------------------------------------------------------------------------------------------------------------------------------------------------------------------------------------------------------------------------------------------------  03/15/2018:  Today I reviewed his chart.   --Reviewed that his last routine 33-monthffice visit with me was 12/07/2017 which is documented above. --I reviewed that since that visit he had 2 visits with me regarding a fall at home.  The following information is copied from those visits.    02/18/2018: His daughter accompanies him for visit today. Had reviewed that his last routine visit with me was at the beginning of March and that at that visit things were fairly stable.  We had just recently adjusted some of his diabetic medications secondary to CKD.  Otherwise things were stable and from a symptomatic standpoint patient had been stable and had not been reporting any concerns or symptoms.  However, today this daughter who accompanies him today states that she has been noticing a decline with him for months.  Has noticed him  having more problems with his short-term memory.  Says that she knows that he comes in here for his visits and does not tell me that he is having any issues.  Says that he tries to appear as if things are fine and that he is handling everything fine but she does not think he is.  She states that she lives about 15 to 20 minutes from  him.  Says that on average she usually sees him 1-2 times per week but she had not seen him for 2 weeks up until yesterday-- prior to yesterday had not seen him for 2 weeks.  However says that she does talk to him on the phone more often.  States that he also has 1 son that lives in [place]. States that he has another son that lives in [place].  Has these 3 children.  She reports that all 3 of them work full-time. She works 4:30 am - noon.  We discussed that she would be available in the afternoons to come with him to follow-up office visits with me.  She reports that they have tried to bring up the issue that he seems to need some assistance but that he seems to "not want to hear it ".  She says that she "does not know whether he is just too proud or just too independent."  They report that he fell yesterday morning and was on the floor for an extended amount of time.  Patient states that he did not have a phone and that he had fallen and was on the floor and he tried to crawl but could not because his leg hurt.  Says that he finally got himself to a recliner/chair that he was able to hold to and try to walk but he held to the chair and collapsed again.  Somehow he did eventually get to a phone and called his daughter.  Says that all she heard on the phone was him saying "help me, help me".  She took him to the ER and I do have chest x-ray report and lab report that was done there but then he left the ER before evaluation was complete.  Today I reviewed with patient and daughter that significant findings from the labs and x-ray from the ER show: Troponin negative. Chest x-ray showed no acute abnormality Lab was significant for elevated white count Lab also significant for significant elevation of BUN and mild elevation of creatinine consistent with dehydration.  Discussed with patient and daughter that ideally he does need to be completely evaluated and completely treated at the  emergency room/hospital.  However he refuses to go back to the hospital. Discussed with patient and daughter that my plan would be to give him IV fluids here and to evaluate because of infection/elevated white count.  They both report that he has had significant congested cough for about 2 weeks.  Daughter states that even with talking to him on the telephone that she has been able to tell that he has had a "bad cough "for about 2 weeks.  Denies any other signs or symptoms of infection.  Has had no dysuria.  No pain in his suprapubic/pelvic region.  No abdominal pain.  No vomiting or diarrhea.  He has not been having any fevers or chills.    A/P AT THAT OV----  Discussed with patient and daughter that ideally he does need to be completely evaluated and completely treated at the emergency room/hospital.  However he refuses to  go back to the hospital. Discussed with patient and daughter that my plan would be to give him IV fluids here and to evaluate cause of infection/elevated white count.  They both report that he has had significant congested cough for about 2 weeks.  Daughter states that even with talking to him on the telephone that she has been able to tell that he has had a "bad cough "for about 2 weeks.  Denies any other signs or symptoms of infection.  Has had no dysuria.  No pain in his suprapubic/pelvic region.  No abdominal pain.  No vomiting or diarrhea.  He has not been having any fevers or chills.  Discussed whether his leg still has pain whether he is having any area of pain that needs x-ray to evaluate for fracture.  He reports that there is no area of significant pain at this point and refuses need for x-ray.  UA performed today shows no UTI.  He is treated here today with 2 bags of IV Fluids.  He is given Rx for Levaquin 55m QD x 7 days.  He has scheduled f/u OV with me on Monday afternoon--this daughter is available to come with him to that OV.  He is to go to ER if  symptoms worsen in interim. Pt and daughter voice understanding, agree.    02/22/2018: Today pt looks much better. Says he feels a lot better too.  Reports he has been taking Levaquin as directed. Says cough is better already.  Says he has been up, walking around house some.  I discussed with pt and daughter--- whether they feel he is safe at home, can care for himself. Discussed cost of care at home, assisted living, nursing home etc. They are to discuss as a family regarding finances etc. I discussed that insurance should cover Physical Therapy and that could improve his strength and balance etc. He is agreeable to proceed with PT.  Daughter requests medication for him to use for allergy symptoms that is safe to use with his other meds--says he has had some sneezing, rhinorrhea. AT THAT OV: Placed order for physical therapy Obtained follow-up CBC and BMET. Also daughter plan to get life alert for patient to have available to call if needs help.    03/15/2018: Today I asked patient about physical therapy.  He states that he never heard anything from them.  He states that he does not have any type of voicemail or answering machine. He states that he has had no further falls or near falls. He does now have a life alert and is wearing this today. While he was here in the office during his visit I spoke to referral staff and they got his appointment scheduled with physical therapy and told him date and time for him to go for physical therapy as I was concerned he would not get that via phone message.  That is scheduled during today's visit. Today he reports that he did go to the kidney doctor.  He has paperwork and [facility] The paperwork indicates that they were ordering labs and kidney ultrasound.  States that he does have follow-up appointment scheduled to go back to them for follow-up.  He has no other updates today.  Has no specific concerns to  address today.  Today he reports that he has been taking medications as directed.  He is taking the Glucotrol 10 mg daily.  He did not bring in any blood sugar  readings for me to review. He is taking the simvastatin and Niaspan.  Also still taking the gabapentin for neuropathy.     Addendum added 03/18/2018: Received note from [facility].  Date of encounter 03/09/2018.  Dr. Terri Skains. His note includes the following: "HPI: This patient is an 83 year old male who presents with chronic kidney disease.  This is classified as stage 34. 83 year old white male with history of type 2 diabetes for > 35 years (A1c 7.4, as high as 8.9), hypertension, hyperlipidemia, neuropathy, nephrolithiasis,  referred by PCP for evaluation of elevated serum creatinine level and CKD management.   Patient had serum creatinine level of 2.32 on 02/22/2018, 2.5 on 02/17/2018, 1.6---11/2017, 1.36 --- 04/2015. He uses Aleve occasionally for pain management. Denies urinary symptoms. No lower extremity edema. No history of hepatitis or HIV.  No tobacco or excessive alcohol use.  No family history of kidney disease. He is on lisinopril.  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   Serum creatinine level gradually increasing at least since 2016 with last serum creatinine level 2.32 on 02/2018.   Check renal panel, urinalysis, spot urine PCR, hepatitis B, C, and HIV serology.   Plan to check CKD labs including anemia and bone disease as below.   US renal to evaluate kidney size and echogenicity.   Advised to avoid NSAIDs and avoid nephrotoxins.   I discussed the stages of kidney disease and the importance of better control of DM, HTN, and avoiding nephrotoxins in order to delay progression of CKD.   On KCl oral, we will follow-up today's lab." Hypertension, renal disease: Today's impression: BP  suboptimal.  On lisinopril.  Advised low-salt diet and monitor BP.  Addendum note was added by Dr. Junie Panning on 03/15/18 "Kidney function is better with creatinine 1.31.   Found to have low iron and hemoglobin 10.6.  Please call in for oral iron 325 mg twice daily. Iron sat 16, serum iron 43 PTH 14, vitamin D 29.5 "   03/24/2018: At visit 03/15/2018 labs came back showing A1c 9.5. At that time had him schedule another OV with me for a 30-minute slot to discuss starting basal insulin. Returns for that visit today. So today I did review that he did have OV with [facility] and reviewed above addendum with their information. Also today I did review that he has been having visits with physical therapy. He has no concerns to address today.  Is here to discuss insulin. 30 minutes was spent in face-to-face interaction with him discussing: --- Stop glipizide/Glucotrol. --- Check fasting blood sugar every morning and document on the log form that I have given him today. --- Give insulin 15 units each morning. --- Scheduled follow-up visit with me on Monday at 11:00 AM.  He is to bring that blood sugar log form with him to that visit. Today I have given him a sample of Basaglar. I have explained that his insurance may cover a different brand name of insulin but that will be an equivalent. Have shown him how to use the insulin pen and how to dial to the 15 units. Written all information down on paper for him to have to refer to at home. Have answered all questions and he voices understanding.   03/31/2018: Today he reports that he did stop the Glucotrol/glipizide. Today he reports that he has been given the insulin at 15 units each  morning. Today he does bring in his blood sugar log form. He has been getting the following fasting morning readings: 349, 364, 372, 292, 327, 333, 323  He has no other concerns to address today.     04/05/2018: He brings in blood sugar log form. Has  been administering insulin as directed. Unfortunately, his blood sugar is still elevated and really has not come down much. However, I do think that he is his blood sugar meter is reading correctly and that his sugar may still be high because his A1c was at 9.5.  Ever it is that his sugars are reading similar to what they were a week ago on basically half the amount of insulin that he is on now and he says that his diet really has not changed over the past week. Nonetheless his most reading recent readings are: Wednesday 6/26------15 units----- 323 Thursday 6/27---------20 units -----367 Friday 6/28--------------22 units------286 A 6/29----------------------24 units------ 350 Sunday 6/30-------------26 units------- 353 Monday 7/1-----------28 units----------- 362   04/12/2018:  He brings in blood sugar log form. He has been administering insulin as directed. No other concerns to address today. Thursday, 03/25/2018-----15 units------ 349 Friday, 03/26/2018----------15 units------364 Saturday, 03/27/2018----- 15 units-------372 Sunday, 03/28/2018--------15 units--------292 Monday, 03/29/2018-------- 15 units------ 327 Tuesday 03/30/2018--------15 units------- 333 Wednesday, 03/31/2018--- 15 units------ 323 Thursday, 04/01/2018--------20 units------367 Friday, 04/02/2018-----------22 units--------286 Saturday 04/03/2018--------24 units---------350 Sunday, 04/04/2018----------26 units---------353 Monday, 04/05/2018------------28 units---------362 Tuesday, 04/06/2018-----------34 units--------255 Wednesday, 04/07/2018-------36 units-------294 Thursday, 04/08/2018-----------38 units-------300 Friday 04/09/2018----------------40 units-----------234 Saturday 04/10/2018-------------42 units--------344 Sunday, 04/11/2018--------------44 units -------379 Monday, 04/12/2018-------------46 units----------290   04/19/2018: Tuesday 04/13/2018-------------- 52 units-----------265 Wednesday 04/14/2018-------- 54  units-----------190 Thursday 04/15/2018------------56 units-----------169 Friday 04/16/2018---------------- 58 units-------------194 Saturday 04/17/2018------------- 60 units---------------227 Sunday 04/18/2018---------------62 units---continued at 60 units----------227 Monday 04/19/2018----------------64 units--continued at 60 units----------184  He brings in blood sugar log with readings documented as above.  He reports that the for the last several days he has kept it at 60 units daily.   Says that 1 of those days during the day he got reading of 110 so that is when he decided to keep it at 60 units.  Today he says that he thinks he knows what got his sugars so high.  Says that he "thinks he was eating the wrong things." Also says that he has had some issues with trying to cook on the stove.  Says that if he does not stand right there next to it then he forgets about it and ends up burning his food.  Says yesterday he was trying to cook him some peas and green beans and ended up burning them. Asked what type of foods he is eating. Asked what he ate yesterday. That is when he says that he tried to cook the peas and the green beans but they ended up burning.  Then went to [employer] and ate pork chop cabbage and green beans.  Did not eat any of their biscuits but did have one cornbread.  Asked what he ate the prior day and he could not really remember exactly.  I asked what a typical day is like. That a lot of times he ends up waking up around 2 in the morning and eventually will eat some Cheerios and then go back to bed around 8 AM and sleeps till around 11.  Says that if it is a week day then he will go to [employer] and usually will get vegetables they are.  Asked what he will then eat for dinner.  Says that he usually tries to eat some vegetables mostly.  Reminded him that in the past at one point he told me he was not eating the right foods and then he made significant improvement in his  diet and was extremely compliant.  Reviewed with him that I was not aware of that he had slipped back into his old habits.  Today I discussed he says that he does have the microwave so I have recommended he get some foods to cook in the microwave as that would be safer and to avoid using the oven and stove. Also discussed healthy food options. continue the insulin at 60 units daily and he will wait 1 month to return for follow-up visit.  Will bring blood sugar readings to that visit.  He will follow-up sooner if any concerns.   Addendum Added 05/17/2018: Received note from [facility].  Their note is dated 05/11/2018. States that serum creatinine level is improved to 1.31 on 03/09/2018. Renal unremarkable except renal cyst. Continue lisinopril. BP acceptable.  Advise better glycemic control. Repeat renal panel today.  Renal function panel from 05/11/2018 is included.  BUN 30 creatinine 1.41.  GFR for non-African American 46. Plan for follow-up visit 3 months. No changes in meds. Also noted anemia associated with chronic renal failure.  Reviewed iron and hemoglobin from 03/09/2018.  Plan: continue oral iron. Reviewed vitamin D deficiency.  Reviewed PTH and vitamin D levels from 03/09/2018.  Continue oral vitamin D.     05/19/2018: He brings in blood sugar log form.   He has continued giving 60 units daily since last visit. Fasting blood sugar readings are: 190, 184, 134, 153, 163, 168, 171, 165, 120, 171, 167, 159, 128, 294, 202, 176, 141, 246, 277, 271, 200, 214.  Asked about his dietary intake since last visit. Says that "it still is not good." Says that he has had to use all microwave dinners.   Has been trying to completely avoid cooking anything on the stove top. "  Had to go buy new pots and pans because the other ones were burned up" However says that his microwave foods have been mostly spaghetti and another thing that includes [place] with dressing and potatoes.  He has no  other specific concerns today.    Past Medical History:  Diagnosis Date  . Arthritis   . Diabetes mellitus    Type 2  . Diabetic neuropathy ([place])   . History of kidney stones   . HTN (hypertension) 03/03/2013  . Hypercholesterolemia   . 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 Skin cancer of arm 04/20/2013   left upper arm  . Spinal stenosis 05/17/2014   L3-L4   . Urgency of urination      Home Meds: Outpatient Medications Prior to Visit  Medication Sig Dispense Refill  . acetaminophen (TYLENOL) 500 MG tablet Take 1,000 mg by mouth every 8 (eight) hours as needed for mild pain or moderate pain (back pain).    Marland Kitchen aspirin EC 81 MG tablet Take 81 mg by mouth daily.      . Cetirizine HCl (ZYRTEC ALLERGY) 10 MG CAPS Take once daily as needed for allergy symptoms. (May cause drowsiness) 30 capsule 5  . cholecalciferol (VITAMIN D) 1000 UNITS tablet Take 1,000 Units by mouth daily.      . Cyanocobalamin (B-12 PO) Take 1 tablet by mouth daily.    . fish oil-omega-3 fatty acids 1000 MG capsule Take 3 g by mouth 2 (two) times daily.      Marland Kitchen gabapentin (NEURONTIN) 600 MG tablet  TAKE 1 TABLET BY MOUTH THREE TIMES DAILY 90 tablet 3  . HYDROcodone-acetaminophen (NORCO) 7.5-325 MG tablet Take 1 tablet by mouth every 6 (six) hours as needed for moderate pain. 30 tablet 0  . Insulin Pen Needle (BD PEN NEEDLE MICRO U/F) 32G X 6 MM MISC Use with insulin pen 100 each 3  . niacin (NIASPAN) 500 MG CR tablet TAKE 1 TABLET BY MOUTH AT BEDTIME 90 tablet 0  . ONE TOUCH ULTRA TEST test strip USE TO CHECK FASTING BLOOD SUGARS THREE TIMES DAILY 100 each 11  . potassium gluconate 595 MG TABS tablet Take 595 mg by mouth daily.    . simvastatin (ZOCOR) 40 MG tablet TAKE 1 TABLET BY MOUTH ONCE DAILY WITH BREAKFAST 90 tablet 0  . Insulin Glargine (LANTUS SOLOSTAR) 100 UNIT/ML Solostar Pen Inject 15 Units into the skin every morning. Start at 15 units each morning-up to 50 units in the daily in the  morning (Patient  taking differently: Inject 15 Units into the skin every morning. Start at 15 units each morning-up to 50 units in the daily in the  Morning,patient taking 60 units) 3 pen 3  . lisinopril (PRINIVIL,ZESTRIL) 10 MG tablet Take 1 tablet (10 mg total) by mouth daily. 90 tablet 0  . Multiple Vitamin (MULITIVITAMIN WITH MINERALS) TABS Take 1 tablet by mouth daily.       No facility-administered medications prior to visit.     Allergies:  Allergies  Allergen Reactions  . Actos [Pioglitazone]     LE Edema--03/2014    Social History   Socioeconomic History  . Marital status: Divorced    Spouse name: Not on file  . Number of children: Not on file  . Years of education: Not on file  . Highest education level: Not on file  Occupational History  . Not on file  Social Needs  . Financial resource strain: Not on file  . Food insecurity:    Worry: Not on file    Inability: Not on file  . Transportation needs:    Medical: Not on file    Non-medical: Not on file  Tobacco Use  . Smoking status: Former Smoker    Last attempt to quit: 03/04/2003    Years since quitting: 15.2  . Smokeless tobacco: Never Used  Substance and Sexual Activity  . Alcohol use: No  . Drug use: No  . Sexual activity: Not on file  Lifestyle  . Physical activity:    Days per week: Not on file    Minutes per session: Not on file  . Stress: Not on file  Relationships  . Social connections:    Talks on phone: Not on file    Gets together: Not on file    Attends religious service: Not on file    Active member of club or organization: Not on file    Attends meetings of clubs or organizations: Not on file    Relationship status: Not on file  . Intimate partner violence:    Fear of current or ex partner: Not on file    Emotionally abused: Not on file    Physically abused: Not on file    Forced sexual activity: Not on file  Other Topics Concern  . Not on file  Social History Narrative  . Not on file    History  reviewed. No pertinent family history.   Review of Systems:  See HPI for pertinent ROS. All other ROS negative.     Physical  Exam: Blood pressure (!) 116/50, pulse 63, temperature 97.6 F (36.4 C), temperature source Oral, resp. rate 14, height _0  (1.727 m), weight 91.1 kg, SpO2 97 %., Body mass index is 30.53 kg/m. General:  WNWD WM. Appears in no acute distress. Neck: Supple. No thyromegaly. No lymphadenopathy. Lungs: Clear bilaterally to auscultation without wheezes, rales, or rhonchi. Breathing is unlabored. Heart: RRR with S1 S2. No murmurs, rubs, or gallops. Abdomen: Soft, non-tender, non-distended with normoactive bowel sounds. No hepatomegaly. No rebound/guarding. No obvious abdominal masses. Musculoskeletal:  Strength and tone normal for age. Extremities/Skin: Warm and dry.  No edema.  Neuro: Alert and oriented X 3. Moves all extremities spontaneously. Gait is normal. CNII-XII grossly in tact. Psych:  Responds to questions appropriately with a normal affect.     Diabetic foot exam: Inspection: On the dorsal aspect of his feet he has a lot of scaly skin. No open wounds or sores. Sensation is intact. Plantar surface of his feet look good. No calluses or wounds. Trace left DP pulse. Trace right DP pulse. No palpable posterior tibial pulses bilaterally.      ASSESSMENT AND PLAN:  83 y.o. year old male with    1. Type 2 diabetes mellitus with complication, with long-term current use of insulin ([place])  2. Diabetic polyneuropathy associated with other specified diabetes mellitus ([place])  04/19/2018: Today I discussed he says that he does have the microwave so I have recommended he get some foods to cook in the microwave as that would be safer and to avoid using the oven and stove. Also discussed healthy food options. continue the insulin at 60 units daily and he will wait 1 month to return for follow-up visit.  Will bring blood sugar readings to that visit.  He will follow-up  sooner if any concerns.  05/19/2018: Increase insulin to 64 units daily.  Need to document fasting readings.  Follow-up visit 1 month.  Follow-up sooner if needed. Reviewed that his last A1c and c-Met were 03/15/2018 so follow-up on this is due 06/15/2018 which will be 1 month from now.    Essential hypertension 05/19/2018:  ----Blood Pressure is low today at 116/50.   ---Reviewed that it was also low at last visit 04/19/2018.  At that time was 116/58.   ---Very concerned about lightheadedness and falls with him.   ----Will have him discontinue lisinopril.       THE FOLLOWING IS PULLED FORWARD FROM OV NOTE 03/15/2018---NOT ADDRESSED AT OV 03/24/2018, 03/31/2018, 04/19/2018, 05/19/2018:  12/07/2017:For him the Vania Rea is costing $45.  Celesta Gentile is costing $35. Today I checked for samples but we only have one bottle remaining of samples for the Jardiance and I have given him that.  We had no samples of the Januvia. I had discussion with him explaining that I do not have any cheaper options that we can use at this point. Discussed that our options are limited in the future if his A1c is elevated only things to consider adding would be once weekly Trulicity or another med from that class or adding Lantus. He is to check fasting blood sugars and call me with these after checking for several days. Also, I told him that in general even down the road that if he thinks he is having uncontrolled blood sugars or thinks he is having any side effects from meds etc. to call and inform me rather than adjusting medicines on his own.  He voices understanding and agrees.  This point will plan for follow-up  visit in 3 months but to follow-up sooner if needed and call with blood sugar readings after med changes.  Also in the past he has not been able to afford Invokana. Also in the past has history of lower extremity edema with Actos.  03/15/2018: Secondary to CKD and cost metformin has been discontinued and also  Jardiance and Januvia.  Now is currently just on Glucotrol 10 mg.  Will check A1c and then follow-up accordingly.  He does have annual eye exam.  On ASA 46m QD On statin. On ACE inhibitor.  Has had Pneumovax 23 in past. Had Prevnar 13-- 12/2013.  Diabetic mononeuropathy associated with diabetes mellitus due to underlying condition ([place] 03/15/2018---Stable/controlled with current dose of Neurontin  CKD (chronic kidney disease) stage 3, GFR 30-59 ml/min ([facility]) 03/15/2018: Some diabetic medications have been discontinued secondary to CKD.  Also [facility]  He is now managing CKD.  Addendum added 03/18/2018: Received note from [facility]  Date of encounter 03/09/2018.  Dr. DTerri Skains His note includes the following: "HPI: This patient is an 83year old male who presents with chronic kidney disease.  This is classified as stage 461 [id number] old white male with history of type 2 diabetes for > 35 years (A1c 7.4, as high as 8.9), hypertension, hyperlipidemia, neuropathy, nephrolithiasis,  referred by PCP for evaluation of elevated serum creatinine level and CKD management.   Patient had serum creatinine level of 2.32 on 02/22/2018, 2.5 on 02/17/2018, 1.6---11/2017, 1.36 --- 04/2015. He uses Aleve occasionally for pain management. Denies urinary symptoms. No lower extremity edema. No history of hepatitis or HIV.  No tobacco or excessive alcohol use.  No family history of kidney disease. He is on lisinopril.  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   Serum creatinine level gradually increasing at least since 2016 with last serum creatinine level 2.32 on 02/2018.   Check renal panel, urinalysis, spot urine PCR, hepatitis B, C, and HIV serology.   Plan to check CKD labs including anemia and bone disease as below.    UKorearenal to evaluate kidney size and echogenicity.   Advised to avoid NSAIDs and avoid nephrotoxins.   I discussed the stages of kidney disease and the importance of better control of DM, HTN, and avoiding nephrotoxins in order to delay progression of CKD.   On KCl oral, we will follow-up today's lab." Hypertension, renal disease: Today's impression: BP suboptimal.  On lisinopril.  Advised low-salt diet and monitor BP.  Addendum note was added by Dr. BJunie Panningon 03/15/18 "Kidney function is better with creatinine 1.31.   Found to have low iron and hemoglobin 10.6.  Please call in for oral iron 325 mg twice daily. Iron sat 16, serum iron 43 PTH 14, vitamin D 29.5 "  Addendum Added 05/17/2018: [facility]  [facility] is dated 05/11/2018. States that serum creatinine level is improved to 1.31 on 03/09/2018. Renal unremarkable except renal cyst. Continue lisinopril. BP acceptable.  Advise better glycemic control. Repeat renal panel today.  Renal function panel from 05/11/2018 is included.  BUN 30 creatinine 1.41.  GFR for non-African American 46. Plan for follow-up visit 3 months. No changes in meds. Also noted anemia associated with chronic renal failure.  Reviewed iron and hemoglobin from 03/09/2018.  Plan: continue oral iron. Reviewed vitamin D deficiency.  Reviewed PTH and vitamin  D levels from 03/09/2018.  Continue oral vitamin D.    Essential hypertension 03/15/2018:  Blood pressure is at goal/controlled. He is on ACE inhibitor.Check labs to monitor.   Hypercholesterolemia 03/15/2018---He is on Statin and Niaspan. Last FLP at goal. LFTs normal. Cont current meds.  Bilateral lower extremity edema 03/15/2018---He uses HCTZ 12.85m PRN swelling--no longer having to use daily.    screening PSA: Normal 07/2011. 05/27/2017---Will not repeat given advanced age   screening colonoscopy: At recent office visits we have discussed that this was due.  At visit  06/2014 he says that he needs to get his back pain control prior to scheduling this. Given age 83 can probably stop these--will discuss with pt Discussed colonoscpoy with patient at visit 04/12/15. He does seem interested in wanting to have continued follow-up colonoscopies. However at today's visit he just says it to much is going on and for uKoreato rediscuss it at his next visit once things settle down. Discussed visit 05/21/16 and he is agreeable for me to place order for referral to GI for follow-up colonoscopy.---Ordered referrral to GI -- 05/21/2016 08/21/2016---Has colonoscopy scheduled this upcoming week.  11/26/2016--He reports that Dr. BKalman Shangave him hemeOccults cards to turn in --did not have to do colonoscopy  Immunizations: Pneumonia vaccine: Received Pneumovax here 05/01/2008. Updated with Prevnar 13 12/2013.Received 06/23/2016 Tetanus: Given here 07/23/2011 Zostavax: He checked with his insurance-- cost would be $200 patient defers. Shingrix---05/27/2017----currently on back-order---will discuss in future--when available Influenza Vaccine: Given here 08/06/2015      Signed, MOlean ReeDFlower Mound PUtah [facility]8/14/2019 4:33 PM

## 2018-05-24 ENCOUNTER — Other Ambulatory Visit: Payer: Self-pay

## 2018-05-24 MED ORDER — INSULIN GLARGINE 100 UNIT/ML SOLOSTAR PEN: 64.0000 [IU] | PEN_INJECTOR | Freq: Every day | SUBCUTANEOUS | 3 refills | Status: DC

## 2018-05-31 ENCOUNTER — Other Ambulatory Visit: Payer: Self-pay | Admitting: Physician Assistant

## 2018-06-16 ENCOUNTER — Ambulatory Visit (INDEPENDENT_AMBULATORY_CARE_PROVIDER_SITE_OTHER): Payer: PPO | Admitting: Physician Assistant

## 2018-06-16 ENCOUNTER — Other Ambulatory Visit: Payer: Self-pay

## 2018-06-16 ENCOUNTER — Encounter: Payer: Self-pay | Admitting: Physician Assistant

## 2018-06-16 VITALS — BP 118/50 | HR 65 | Temp 97.8°F | Resp 16 | Ht 68.0 in | Wt 200.2 lb

## 2018-06-16 DIAGNOSIS — N183 Chronic kidney disease, stage 3 unspecified: Secondary | ICD-10-CM

## 2018-06-16 DIAGNOSIS — E7800 Pure hypercholesterolemia, unspecified: Secondary | ICD-10-CM

## 2018-06-16 DIAGNOSIS — I10 Essential (primary) hypertension: Secondary | ICD-10-CM | POA: Diagnosis not present

## 2018-06-16 DIAGNOSIS — E118 Type 2 diabetes mellitus with unspecified complications: Secondary | ICD-10-CM | POA: Diagnosis not present

## 2018-06-16 DIAGNOSIS — Z794 Long term (current) use of insulin: Secondary | ICD-10-CM

## 2018-06-16 NOTE — Progress Notes (Signed)
Patient ID: Christian [name] MRN: [id number], DOB: [birth year], 83 y.o. Date of Encounter: _0 @  Chief Complaint:  Chief Complaint  Patient presents with  . Hypertension  . Diabetes    HPI: 83 y.o. year old male  presents for routine 3 months f/u OV to f/u DM, HTN, and other chronic medical problems.   Hypertension: 12/03/2017: Taking meds as directed. No adverse effects.  Hyperlipidemia: 12/03/2017: On Zocor 10m and on Niaspan. No myalgias or other adverse medication effects.  Diabetes: In the past we had difficulty getting his diabetes controlled and he was very noncompliant with diet. All of this information is detailed and documented in my note 05/21/16. See that note for details of all of the past information. 03/2014 he was having lower extremity edema so Actos was stopped and was changed to J[place]ane [place] Also --have added Invokana, but unable to afford- At the time of his OV 09/02/2017---He was on:  metformin, Glucotrol, Januvia. Labs done 09/02/17 showed elevated creatinine.  At that time I said to stop metformin and to decrease Januvia to 50 mg. Today--12/03/2017--- he states that when he stopped the metformin and decreased the Januvia, that his blood sugar went up really high and his vision got blurry.   He says that that scared him so he went back on the metformin and went back on the Januvia 100 mg.  At [place]2019: Have told him to stop the metformin and decrease the Januvia to 50 mg.  Will continue the Glucotrol XL 10 mg daily.   To try to keep his blood sugar controlled with having to stop the metformin and decrease the Januvia,  I am adding Jardiance 25 mg every morning. I will have him come in for follow-up visit on Monday to make sure he has been able to make these medicine changes and to make sure that his blood sugar is controlled.   12/07/2017: Today he reports that he has been taking medications as directed at his recent visit 12/03/17. He did stop the metformin  and he did decrease the Januvia down to 50 mg. He did continue the Glucotrol 10 mg daily. He did add the Jardiance 25 mg daily. I asked if he had some blood sugar readings for me to review.  He says " OPark Meo I forgot to even check any blood sugars--- I was so busy adjusting these medicines." Says that the Jardiance cost $45 for him.  Says the Januvia cost him $35. He states that he is feeling fine after making these medicine adjustments.  He is having no adverse effects.       ------------------------------------------------------------------------------------------------------------------------------------------------------------------------------------------------------------------------------------------------------------------------  03/15/2018:  Today I reviewed his chart.   --Reviewed that his last routine 316-monthffice visit with me was 12/07/2017 which is documented above. --I reviewed that since that visit he had 2 visits with me regarding a fall at home.  The following information is copied from those visits.    02/18/2018: His daughter accompanies him for visit today. Had reviewed that his last routine visit with me was at the beginning of March and that at that visit things were fairly stable.  We had just recently adjusted some of his diabetic medications secondary to CKD.  Otherwise things were stable and from a symptomatic standpoint patient had been stable and had not been reporting any concerns or symptoms.  However, today this daughter who accompanies him today states that she has been noticing a decline with him for months.  Has noticed him  having more problems with his short-term memory.  Says that she knows that he comes in here for his visits and does not tell me that he is having any issues.  Says that he tries to appear as if things are fine and that he is handling everything fine but she does not think he is.  She states that she lives about 15 to 20 minutes from  him.  Says that on average she usually sees him 1-2 times per week but she had not seen him for 2 weeks up until yesterday-- prior to yesterday had not seen him for 2 weeks.  However says that she does talk to him on the phone more often.  States that he also has 1 son that lives in [place]. States that he has another son that lives in [place].  Has these 3 children.  She reports that all 3 of them work full-time. She works 4:30 am - noon.  We discussed that she would be available in the afternoons to come with him to follow-up office visits with me.  She reports that they have tried to bring up the issue that he seems to need some assistance but that he seems to "not want to hear it ".  She says that she "does not know whether he is just too proud or just too independent."  They report that he fell yesterday morning and was on the floor for an extended amount of time.  Patient states that he did not have a phone and that he had fallen and was on the floor and he tried to crawl but could not because his leg hurt.  Says that he finally got himself to a recliner/chair that he was able to hold to and try to walk but he held to the chair and collapsed again.  Somehow he did eventually get to a phone and called his daughter.  Says that all she heard on the phone was him saying "help me, help me".  She took him to the ER and I do have chest x-ray report and lab report that was done there but then he left the ER before evaluation was complete.  Today I reviewed with patient and daughter that significant findings from the labs and x-ray from the ER show: Troponin negative. Chest x-ray showed no acute abnormality Lab was significant for elevated white count Lab also significant for significant elevation of BUN and mild elevation of creatinine consistent with dehydration.  Discussed with patient and daughter that ideally he does need to be completely evaluated and completely treated at the  emergency room/hospital.  However he refuses to go back to the hospital. Discussed with patient and daughter that my plan would be to give him IV fluids here and to evaluate because of infection/elevated white count.  They both report that he has had significant congested cough for about 2 weeks.  Daughter states that even with talking to him on the telephone that she has been able to tell that he has had a "bad cough "for about 2 weeks.  Denies any other signs or symptoms of infection.  Has had no dysuria.  No pain in his suprapubic/pelvic region.  No abdominal pain.  No vomiting or diarrhea.  He has not been having any fevers or chills.    A/P AT THAT OV----  Discussed with patient and daughter that ideally he does need to be completely evaluated and completely treated at the emergency room/hospital.  However he refuses to  go back to the hospital. Discussed with patient and daughter that my plan would be to give him IV fluids here and to evaluate cause of infection/elevated white count.  They both report that he has had significant congested cough for about 2 weeks.  Daughter states that even with talking to him on the telephone that she has been able to tell that he has had a "bad cough "for about 2 weeks.  Denies any other signs or symptoms of infection.  Has had no dysuria.  No pain in his suprapubic/pelvic region.  No abdominal pain.  No vomiting or diarrhea.  He has not been having any fevers or chills.  Discussed whether his leg still has pain whether he is having any area of pain that needs x-ray to evaluate for fracture.  He reports that there is no area of significant pain at this point and refuses need for x-ray.  UA performed today shows no UTI.  He is treated here today with 2 bags of IV Fluids.  He is given Rx for Levaquin 55m QD x 7 days.  He has scheduled f/u OV with me on Monday afternoon--this daughter is available to come with him to that OV.  He is to go to ER if  symptoms worsen in interim. Pt and daughter voice understanding, agree.    02/22/2018: Today pt looks much better. Says he feels a lot better too.  Reports he has been taking Levaquin as directed. Says cough is better already.  Says he has been up, walking around house some.  I discussed with pt and daughter--- whether they feel he is safe at home, can care for himself. Discussed cost of care at home, assisted living, nursing home etc. They are to discuss as a family regarding finances etc. I discussed that insurance should cover Physical Therapy and that could improve his strength and balance etc. He is agreeable to proceed with PT.  Daughter requests medication for him to use for allergy symptoms that is safe to use with his other meds--says he has had some sneezing, rhinorrhea. AT THAT OV: Placed order for physical therapy Obtained follow-up CBC and BMET. Also daughter plan to get life alert for patient to have available to call if needs help.    03/15/2018: Today I asked patient about physical therapy.  He states that he never heard anything from them.  He states that he does not have any type of voicemail or answering machine. He states that he has had no further falls or near falls. He does now have a life alert and is wearing this today. While he was here in the office during his visit I spoke to referral staff and they got his appointment scheduled with physical therapy and told him date and time for him to go for physical therapy as I was concerned he would not get that via phone message.  That is scheduled during today's visit. Today he reports that he did go to the kidney doctor.  He has paperwork and [facility] The paperwork indicates that they were ordering labs and kidney ultrasound.  States that he does have follow-up appointment scheduled to go back to them for follow-up.  He has no other updates today.  Has no specific concerns to  address today.  Today he reports that he has been taking medications as directed.  He is taking the Glucotrol 10 mg daily.  He did not bring in any blood sugar  readings for me to review. He is taking the simvastatin and Niaspan.  Also still taking the gabapentin for neuropathy.     Addendum added 03/18/2018: Received note from [facility].  Date of encounter 03/09/2018.  Dr. Terri Skains. His note includes the following: "HPI: This patient is an 83 year old male who presents with chronic kidney disease.  This is classified as stage 34. 83 year old white male with history of type 2 diabetes for > 35 years (A1c 7.4, as high as 8.9), hypertension, hyperlipidemia, neuropathy, nephrolithiasis,  referred by PCP for evaluation of elevated serum creatinine level and CKD management.   Patient had serum creatinine level of 2.32 on 02/22/2018, 2.5 on 02/17/2018, 1.6---11/2017, 1.36 --- 04/2015. He uses Aleve occasionally for pain management. Denies urinary symptoms. No lower extremity edema. No history of hepatitis or HIV.  No tobacco or excessive alcohol use.  No family history of kidney disease. He is on lisinopril.  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   Serum creatinine level gradually increasing at least since 2016 with last serum creatinine level 2.32 on 02/2018.   Check renal panel, urinalysis, spot urine PCR, hepatitis B, C, and HIV serology.   Plan to check CKD labs including anemia and bone disease as below.   US renal to evaluate kidney size and echogenicity.   Advised to avoid NSAIDs and avoid nephrotoxins.   I discussed the stages of kidney disease and the importance of better control of DM, HTN, and avoiding nephrotoxins in order to delay progression of CKD.   On KCl oral, we will follow-up today's lab." Hypertension, renal disease: Today's impression: BP  suboptimal.  On lisinopril.  Advised low-salt diet and monitor BP.  Addendum note was added by Dr. Junie Panning on 03/15/18 "Kidney function is better with creatinine 1.31.   Found to have low iron and hemoglobin 10.6.  Please call in for oral iron 325 mg twice daily. Iron sat 16, serum iron 43 PTH 14, vitamin D 29.5 "   03/24/2018: At visit 03/15/2018 labs came back showing A1c 9.5. At that time had him schedule another OV with me for a 30-minute slot to discuss starting basal insulin. Returns for that visit today. So today I did review that he did have OV with [facility] and reviewed above addendum with their information. Also today I did review that he has been having visits with physical therapy. He has no concerns to address today.  Is here to discuss insulin. 30 minutes was spent in face-to-face interaction with him discussing: --- Stop glipizide/Glucotrol. --- Check fasting blood sugar every morning and document on the log form that I have given him today. --- Give insulin 15 units each morning. --- Scheduled follow-up visit with me on Monday at 11:00 AM.  He is to bring that blood sugar log form with him to that visit. Today I have given him a sample of Basaglar. I have explained that his insurance may cover a different brand name of insulin but that will be an equivalent. Have shown him how to use the insulin pen and how to dial to the 15 units. Written all information down on paper for him to have to refer to at home. Have answered all questions and he voices understanding.   03/31/2018: Today he reports that he did stop the Glucotrol/glipizide. Today he reports that he has been given the insulin at 15 units each  morning. Today he does bring in his blood sugar log form. He has been getting the following fasting morning readings: 349, 364, 372, 292, 327, 333, 323  He has no other concerns to address today.     04/05/2018: He brings in blood sugar log form. Has  been administering insulin as directed. Unfortunately, his blood sugar is still elevated and really has not come down much. However, I do think that he is his blood sugar meter is reading correctly and that his sugar may still be high because his A1c was at 9.5.  Ever it is that his sugars are reading similar to what they were a week ago on basically half the amount of insulin that he is on now and he says that his diet really has not changed over the past week. Nonetheless his most reading recent readings are: Wednesday 6/26------15 units----- 323 Thursday 6/27---------20 units -----367 Friday 6/28--------------22 units------286 A 6/29----------------------24 units------ 350 Sunday 6/30-------------26 units------- 353 Monday 7/1-----------28 units----------- 362   04/12/2018:  He brings in blood sugar log form. He has been administering insulin as directed. No other concerns to address today. Thursday, 03/25/2018-----15 units------ 349 Friday, 03/26/2018----------15 units------364 Saturday, 03/27/2018----- 15 units-------372 Sunday, 03/28/2018--------15 units--------292 Monday, 03/29/2018-------- 15 units------ 327 Tuesday 03/30/2018--------15 units------- 333 Wednesday, 03/31/2018--- 15 units------ 323 Thursday, 04/01/2018--------20 units------367 Friday, 04/02/2018-----------22 units--------286 Saturday 04/03/2018--------24 units---------350 Sunday, 04/04/2018----------26 units---------353 Monday, 04/05/2018------------28 units---------362 Tuesday, 04/06/2018-----------34 units--------255 Wednesday, 04/07/2018-------36 units-------294 Thursday, 04/08/2018-----------38 units-------300 Friday 04/09/2018----------------40 units-----------234 Saturday 04/10/2018-------------42 units--------344 Sunday, 04/11/2018--------------44 units -------379 Monday, 04/12/2018-------------46 units----------290   04/19/2018: Tuesday 04/13/2018-------------- 52 units-----------265 Wednesday 04/14/2018-------- 54  units-----------190 Thursday 04/15/2018------------56 units-----------169 Friday 04/16/2018---------------- 58 units-------------194 Saturday 04/17/2018------------- 60 units---------------227 Sunday 04/18/2018---------------62 units---continued at 60 units----------227 Monday 04/19/2018----------------64 units--continued at 60 units----------184  He brings in blood sugar log with readings documented as above.  He reports that the for the last several days he has kept it at 60 units daily.   Says that 1 of those days during the day he got reading of 110 so that is when he decided to keep it at 60 units.  Today he says that he thinks he knows what got his sugars so high.  Says that he "thinks he was eating the wrong things." Also says that he has had some issues with trying to cook on the stove.  Says that if he does not stand right there next to it then he forgets about it and ends up burning his food.  Says yesterday he was trying to cook him some peas and green beans and ended up burning them. Asked what type of foods he is eating. Asked what he ate yesterday. That is when he says that he tried to cook the peas and the green beans but they ended up burning.  Then went to [employer] and ate pork chop cabbage and green beans.  Did not eat any of their biscuits but did have one cornbread.  Asked what he ate the prior day and he could not really remember exactly.  I asked what a typical day is like. That a lot of times he ends up waking up around 2 in the morning and eventually will eat some Cheerios and then go back to bed around 8 AM and sleeps till around 11.  Says that if it is a week day then he will go to [employer] and usually will get vegetables they are.  Asked what he will then eat for dinner.  Says that he usually tries to eat some vegetables mostly.  Reminded him that in the past at one point he told me he was not eating the right foods and then he made significant improvement in his  diet and was extremely compliant.  Reviewed with him that I was not aware of that he had slipped back into his old habits.  Today I discussed he says that he does have the microwave so I have recommended he get some foods to cook in the microwave as that would be safer and to avoid using the oven and stove. Also discussed healthy food options. continue the insulin at 60 units daily and he will wait 1 month to return for follow-up visit.  Will bring blood sugar readings to that visit.  He will follow-up sooner if any concerns.   Addendum Added 05/17/2018: Received note from [facility].  Their note is dated 05/11/2018. States that serum creatinine level is improved to 1.31 on 03/09/2018. Renal unremarkable except renal cyst. Continue lisinopril. BP acceptable.  Advise better glycemic control. Repeat renal panel today.  Renal function panel from 05/11/2018 is included.  BUN 30 creatinine 1.41.  GFR for non-African American 46. Plan for follow-up visit 3 months. No changes in meds. Also noted anemia associated with chronic renal failure.  Reviewed iron and hemoglobin from 03/09/2018.  Plan: continue oral iron. Reviewed vitamin D deficiency.  Reviewed PTH and vitamin D levels from 03/09/2018.  Continue oral vitamin D.     05/19/2018: He brings in blood sugar log form.   He has continued giving 60 units daily since last visit. Fasting blood sugar readings are: 190, 184, 134, 153, 163, 168, 171, 165, 120, 171, 167, 159, 128, 294, 202, 176, 141, 246, 277, 271, 200, 214.  Asked about his dietary intake since last visit. Says that "it still is not good." Says that he has had to use all microwave dinners.   Has been trying to completely avoid cooking anything on the stove top. "  Had to go buy new pots and pans because the other ones were burned up" However says that his microwave foods have been mostly spaghetti and another thing that includes [place] with dressing and potatoes.  He has no  other specific concerns today.    06/16/2018: Today he reports that things have been fairly stable. He has no specific concerns to address. I discussed that I assume he is still needing to eat a lot of frozen dinners that he can cook in the microwave.  States that he has also been eating a lot of fresh vegetables that are not canned or cooked that he is hoping that is helping some. Reviewed with him that his blood pressure is still on the low side at 118/50 today but did review that he did discontinue the lisinopril at last visit and that he is currently on no meds to lower blood pressure. He reports that he has had no further falls recently and is not feeling lightheaded or presyncope. He does bring in blood sugar log form.  He is documenting blood sugars at different times of day on different dates.  Some readings are around 8 in the morning some around 1 or 2 PM some are at 5:50 AM.  Really they are scattered throughout the day.  Does have some readings in the 200s.  Highest reading is 268.  The reading is 126.  He also has some other fairly low readings at 136, 131.    Past Medical History:  Diagnosis Date  . Arthritis   .  Diabetes mellitus    Type 2  . Diabetic neuropathy ([place])   . History of kidney stones   . HTN (hypertension) 03/03/2013  . Hypercholesterolemia   . 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 Skin cancer of arm 04/20/2013   left upper arm  . Spinal stenosis 05/17/2014   L3-L4   . Urgency of urination      Home Meds: Outpatient Medications Prior to Visit  Medication Sig Dispense Refill  . acetaminophen (TYLENOL) 500 MG tablet Take 1,000 mg by mouth every 8 (eight) hours as needed for mild pain or moderate pain (back pain).    Marland Kitchen aspirin EC 81 MG tablet Take 81 mg by mouth daily.      . Cetirizine HCl (ZYRTEC ALLERGY) 10 MG CAPS Take once daily as needed for allergy symptoms. (May cause drowsiness) 30 capsule 5  . cholecalciferol (VITAMIN D) 1000 UNITS tablet Take  1,000 Units by mouth daily.      . Cyanocobalamin (B-12 PO) Take 1 tablet by mouth daily.    . fish oil-omega-3 fatty acids 1000 MG capsule Take 3 g by mouth 2 (two) times daily.      Marland Kitchen gabapentin (NEURONTIN) 600 MG tablet TAKE 1 TABLET BY MOUTH THREE TIMES DAILY 90 tablet 3  . HYDROcodone-acetaminophen (NORCO) 7.5-325 MG tablet Take 1 tablet by mouth every 6 (six) hours as needed for moderate pain. 30 tablet 0  . Insulin Glargine (LANTUS SOLOSTAR) 100 UNIT/ML Solostar Pen Inject 64 Units into the skin daily. 3 pen 3  . Insulin Pen Needle (BD PEN NEEDLE MICRO U/F) 32G X 6 MM MISC Use with insulin pen 100 each 3  . niacin (NIASPAN) 500 MG CR tablet TAKE 1 TABLET BY MOUTH AT BEDTIME 90 tablet 0  . ONE TOUCH ULTRA TEST test strip USE TO CHECK FASTING BLOOD SUGARS THREE TIMES DAILY 100 each 11  . potassium gluconate 595 MG TABS tablet Take 595 mg by mouth daily.    . simvastatin (ZOCOR) 40 MG tablet TAKE 1 TABLET BY MOUTH ONCE DAILY WITH BREAKFAST 90 tablet 0   No facility-administered medications prior to visit.     Allergies:  Allergies  Allergen Reactions  . Actos [Pioglitazone]     LE Edema--03/2014    Social History   Socioeconomic History  . Marital status: Divorced    Spouse name: Not on file  . Number of children: Not on file  . Years of education: Not on file  . Highest education level: Not on file  Occupational History  . Not on file  Social Needs  . Financial resource strain: Not on file  . Food insecurity:    Worry: Not on file    Inability: Not on file  . Transportation needs:    Medical: Not on file    Non-medical: Not on file  Tobacco Use  . Smoking status: Former Smoker    Last attempt to quit: 03/04/2003    Years since quitting: 15.2  . Smokeless tobacco: Never Used  Substance and Sexual Activity  . Alcohol use: No  . Drug use: No  . Sexual activity: Not on file  Lifestyle  . Physical activity:    Days per week: Not on file    Minutes per session: Not on  file  . Stress: Not on file  Relationships  . Social connections:    Talks on phone: Not on file    Gets together: Not on file    Attends religious service: Not on file  Active member of club or organization: Not on file    Attends meetings of clubs or organizations: Not on file    Relationship status: Not on file  . Intimate partner violence:    Fear of current or ex partner: Not on file    Emotionally abused: Not on file    Physically abused: Not on file    Forced sexual activity: Not on file  Other Topics Concern  . Not on file  Social History Narrative  . Not on file    History reviewed. No pertinent family history.   Review of Systems:  See HPI for pertinent ROS. All other ROS negative.     Physical Exam: Blood pressure (!) 118/50, pulse 65, temperature 97.8 F (36.6 C), temperature source Oral, resp. rate 16, height _0  (1.727 m), weight 90.8 kg, SpO2 97 %., Body mass index is 30.44 kg/m. General:  WNWD WM. Appears in no acute distress. Neck: Supple. No thyromegaly. No lymphadenopathy. No carotid bruits. Lungs: Clear bilaterally to auscultation without wheezes, rales, or rhonchi. Breathing is unlabored. Heart: RRR with S1 S2. No murmurs, rubs, or gallops. Abdomen: Soft, non-tender, non-distended with normoactive bowel sounds. No hepatomegaly. No rebound/guarding. No obvious abdominal masses. Musculoskeletal:  Strength and tone normal for age. Extremities/Skin: Warm and dry. No LE edema. Neuro: Alert and oriented X 3. Moves all extremities spontaneously. Gait is normal. CNII-XII grossly in tact. Psych:  Responds to questions appropriately with a normal affect.     Diabetic foot exam: Inspection: On the dorsal aspect of his feet he has a lot of scaly skin. No open wounds or sores. Sensation is intact. Plantar surface of his feet look good. No calluses or wounds. Trace left DP pulse. Trace right DP pulse. No palpable posterior tibial pulses bilaterally.       ASSESSMENT AND PLAN:  83 y.o. year old male with    1. Type 2 diabetes mellitus with complication, with long-term current use of insulin ([place])  2. Diabetic polyneuropathy associated with other specified diabetes mellitus ([place])  04/19/2018: Today I discussed he says that he does have the microwave so I have recommended he get some foods to cook in the microwave as that would be safer and to avoid using the oven and stove. Also discussed healthy food options. continue the insulin at 60 units daily and he will wait 1 month to return for follow-up visit.  Will bring blood sugar readings to that visit.  He will follow-up sooner if any concerns.  05/19/2018: Increase insulin to 64 units daily.  Need to document fasting readings.  Follow-up visit 1 month.  Follow-up sooner if needed. Reviewed that his last A1c and c-Met were 03/15/2018 so follow-up on this is due 06/15/2018 which will be 1 month from now.  06/16/2018: Currently administering insulin at 64 units daily.  Will continue at this dose.  Will check A1c today.   Essential hypertension 05/19/2018:  ----Blood Pressure is low today at 116/50.   ---Reviewed that it was also low at last visit 04/19/2018.  At that time was 116/58.   ---Very concerned about lightheadedness and falls with him.   ----Will have him discontinue lisinopril.  06/16/2018: Lisinopril has been discontinued secondary to low blood pressure reading.  Blood pressure continues to be on the low end today.  Currently on no meds to lower blood pressure.     THE FOLLOWING IS PULLED FORWARD FROM OV NOTE 03/15/2018---NOT ADDRESSED AT OV 03/24/2018, 03/31/2018, 04/19/2018, 05/19/2018:  12/07/2017:For him  the Vania Rea is costing $45.  Celesta Gentile is costing $35. Today I checked for samples but we only have one bottle remaining of samples for the Jardiance and I have given him that.  We had no samples of the Januvia. I had discussion with him explaining that I do not have any cheaper  options that we can use at this point. Discussed that our options are limited in the future if his A1c is elevated only things to consider adding would be once weekly Trulicity or another med from that class or adding Lantus. He is to check fasting blood sugars and call me with these after checking for several days. Also, I told him that in general even down the road that if he thinks he is having uncontrolled blood sugars or thinks he is having any side effects from meds etc. to call and inform me rather than adjusting medicines on his own.  He voices understanding and agrees.  This point will plan for follow-up visit in 3 months but to follow-up sooner if needed and call with blood sugar readings after med changes.  Also in the past he has not been able to afford Invokana. Also in the past has history of lower extremity edema with Actos.  03/15/2018: Secondary to CKD and cost metformin has been discontinued and also Jardiance and Januvia.  Now is currently just on Glucotrol 10 mg.  Will check A1c and then follow-up accordingly.  He does have annual eye exam.  On ASA 8m QD On statin. On ACE inhibitor.  Has had Pneumovax 23 in past. Had Prevnar 13-- 12/2013.  Diabetic mononeuropathy associated with diabetes mellitus due to underlying condition ([place] 03/15/2018---Stable/controlled with current dose of Neurontin  CKD (chronic kidney disease) stage 3, GFR 30-59 ml/min (HCC) 03/15/2018: Some diabetic medications have been discontinued secondary to CKD.  Also [facility]  He is now managing CKD.  Addendum added 03/18/2018: [place] [facility]  Date of encounter 03/09/2018.  Dr. DTerri Skains His note includes the following: "HPI: This patient is an 83year old male who presents with chronic kidney disease.  This is classified as stage 450 83year old white male with history of type 2 diabetes for > 35 years (A1c  7.4, as high as 8.9), hypertension, hyperlipidemia, neuropathy, nephrolithiasis,  referred by PCP for evaluation of elevated serum creatinine level and CKD management.   Patient had serum creatinine level of 2.32 on 02/22/2018, 2.5 on 02/17/2018, 1.6---11/2017, 1.36 --- 04/2015. He uses Aleve occasionally for pain management. Denies urinary symptoms. No lower extremity edema. No history of hepatitis or HIV.  No tobacco or excessive alcohol use.  No family history of kidney disease. He is on lisinopril.  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   Serum creatinine level gradually increasing at least since 2016 with last serum creatinine level 2.32 on 02/2018.   Check renal panel, urinalysis, spot urine PCR, hepatitis B, C, and HIV serology.   Plan to check CKD labs including anemia and bone disease as below.   UKorearenal to evaluate kidney size and echogenicity.   Advised to avoid NSAIDs and avoid nephrotoxins.   I discussed the stages of kidney disease and the importance of better control of DM, HTN, and avoiding nephrotoxins in order to delay progression of CKD.   On KCl oral, we will follow-up today's lab." Hypertension,  renal disease: Today's impression: BP suboptimal.  On lisinopril.  Advised low-salt diet and monitor BP.  Addendum note was added by Dr. Junie Panning on 03/15/18 "Kidney function is better with creatinine 1.31.   Found to have low iron and hemoglobin 10.6.  Please call in for oral iron 325 mg twice daily. Iron sat 16, serum iron 43 PTH 14, vitamin D 29.5 "  Addendum Added 05/17/2018: Received note from [facility].  Their note is dated 05/11/2018. States that serum creatinine level is improved to 1.31 on 03/09/2018. Renal unremarkable except renal cyst. Continue lisinopril. BP acceptable.  Advise better glycemic control. Repeat renal panel today.  Renal  function panel from 05/11/2018 is included.  BUN 30 creatinine 1.41.  GFR for non-African American 46. Plan for follow-up visit 3 months. No changes in meds. Also noted anemia associated with chronic renal failure.  Reviewed iron and hemoglobin from 03/09/2018.  Plan: continue oral iron. Reviewed vitamin D deficiency.  Reviewed PTH and vitamin D levels from 03/09/2018.  Continue oral vitamin D.     Hypercholesterolemia 03/15/2018---He is on Statin and Niaspan. Last FLP at goal. LFTs normal. Cont current meds.  Bilateral lower extremity edema 03/15/2018---He uses HCTZ 12.33m PRN swelling--no longer having to use daily.    screening PSA: Normal 07/2011. 05/27/2017---Will not repeat given advanced age   screening colonoscopy: At recent office visits we have discussed that this was due.  At visit 06/2014 he says that he needs to get his back pain control prior to scheduling this. Given age 89 can probably stop these--will discuss with pt Discussed colonoscpoy with patient at visit 04/12/15. He does seem interested in wanting to have continued follow-up colonoscopies. However at today's visit he just says it to much is going on and for uKoreato rediscuss it at his next visit once things settle down. Discussed visit 05/21/16 and he is agreeable for me to place order for referral to GI for follow-up colonoscopy.---Ordered referrral to GI -- 05/21/2016 08/21/2016---Has colonoscopy scheduled this upcoming week.  11/26/2016--He reports that Dr. BKalman Shangave him hemeOccults cards to turn in --did not have to do colonoscopy  Immunizations: Pneumonia vaccine: Received Pneumovax here 05/01/2008. Updated with Prevnar 13 12/2013.Received 06/23/2016 Tetanus: Given here 07/23/2011 Zostavax: He checked with his insurance-- cost would be $200 patient defers. Shingrix---05/27/2017----currently on back-order---will discuss in future--when available Influenza Vaccine: Given here 08/06/2015      Signed, MKaris Juba PUtah [facility]9/08/2018 8:40 AM

## 2018-06-17 LAB — COMPLETE METABOLIC PANEL WITH GFR
AG Ratio: 1.5 (calc) (ref 1.0–2.5)
ALKALINE PHOSPHATASE (APISO): 55 U/L (ref 40–115)
ALT: 12 U/L (ref 9–46)
AST: 16 U/L (ref 10–35)
Albumin: 4 g/dL (ref 3.6–5.1)
BUN/Creatinine Ratio: 21 (calc) (ref 6–22)
BUN: 29 mg/dL — ABNORMAL HIGH (ref 7–25)
CHLORIDE: 105 mmol/L (ref 98–110)
CO2: 23 mmol/L (ref 20–32)
Calcium: 9.8 mg/dL (ref 8.6–10.3)
Creat: 1.41 mg/dL — ABNORMAL HIGH (ref 0.70–1.11)
GFR, EST AFRICAN AMERICAN: 54 mL/min/{1.73_m2} — AB (ref >=60)
GFR, Est Non African American: 46 mL/min/{1.73_m2} — ABNORMAL LOW (ref >=60)
GLUCOSE: 145 mg/dL — AB (ref 65–99)
Globulin: 2.7 g/dL (calc) (ref 1.9–3.7)
Potassium: 4.1 mmol/L (ref 3.5–5.3)
SODIUM: 140 mmol/L (ref 135–146)
Total Bilirubin: 0.4 mg/dL (ref 0.2–1.2)
Total Protein: 6.7 g/dL (ref 6.1–8.1)

## 2018-06-17 LAB — HEMOGLOBIN A1C
Hgb A1c MFr Bld: 8.2 % of total Hgb — ABNORMAL HIGH (ref <5.7)
MEAN PLASMA GLUCOSE: 189 (calc)
eAG (mmol/L): 10.4 (calc)

## 2018-06-30 ENCOUNTER — Other Ambulatory Visit: Payer: Self-pay | Admitting: Physician Assistant

## 2018-06-30 MED ORDER — HYDROCODONE-ACETAMINOPHEN 7.5-325 MG PO TABS: 1.0000 | ORAL_TABLET | Freq: Four times a day (QID) | ORAL | 0 refills | Status: DC | PRN

## 2018-06-30 NOTE — Telephone Encounter (Signed)
Last OV 06/16/2018 Last refill 05/03/2018 Ok to refill?

## 2018-06-30 NOTE — Telephone Encounter (Signed)
Refill on hydrocodone to [facility].  °

## 2018-07-02 ENCOUNTER — Other Ambulatory Visit: Payer: Self-pay | Admitting: Physician Assistant

## 2018-07-07 ENCOUNTER — Ambulatory Visit (INDEPENDENT_AMBULATORY_CARE_PROVIDER_SITE_OTHER): Payer: PPO | Admitting: Family Medicine

## 2018-07-07 DIAGNOSIS — Z23 Encounter for immunization: Secondary | ICD-10-CM | POA: Diagnosis not present

## 2018-07-14 ENCOUNTER — Other Ambulatory Visit: Payer: Self-pay | Admitting: Physician Assistant

## 2018-09-08 ENCOUNTER — Other Ambulatory Visit: Payer: Self-pay | Admitting: Family Medicine

## 2018-09-08 ENCOUNTER — Encounter: Payer: Self-pay | Admitting: Physician Assistant

## 2018-09-08 DIAGNOSIS — N183 Chronic kidney disease, stage 3 (moderate): Secondary | ICD-10-CM | POA: Diagnosis not present

## 2018-09-08 NOTE — Telephone Encounter (Signed)
Med refill on hydrocodone to [facility] [place],.

## 2018-09-08 NOTE — Telephone Encounter (Signed)
Patient is requesting a refill on Hydrocodone   LOV: 06/16/18  LRF:   06/30/18 (pt has apt 09/16/18)

## 2018-09-09 MED ORDER — HYDROCODONE-ACETAMINOPHEN 7.5-325 MG PO TABS: 1.0000 | ORAL_TABLET | Freq: Four times a day (QID) | ORAL | 0 refills | Status: DC | PRN

## 2018-09-13 DIAGNOSIS — D631 Anemia in chronic kidney disease: Secondary | ICD-10-CM | POA: Diagnosis not present

## 2018-09-13 DIAGNOSIS — I129 Hypertensive chronic kidney disease with stage 1 through stage 4 chronic kidney disease, or unspecified chronic kidney disease: Secondary | ICD-10-CM | POA: Diagnosis not present

## 2018-09-13 DIAGNOSIS — N200 Calculus of kidney: Secondary | ICD-10-CM | POA: Diagnosis not present

## 2018-09-13 DIAGNOSIS — E559 Vitamin D deficiency, unspecified: Secondary | ICD-10-CM | POA: Diagnosis not present

## 2018-09-13 DIAGNOSIS — N183 Chronic kidney disease, stage 3 (moderate): Secondary | ICD-10-CM | POA: Diagnosis not present

## 2018-09-16 ENCOUNTER — Ambulatory Visit: Payer: PPO | Admitting: Family Medicine

## 2018-09-16 ENCOUNTER — Ambulatory Visit: Payer: PPO | Admitting: Physician Assistant

## 2018-09-21 ENCOUNTER — Telehealth: Payer: Self-pay | Admitting: Family Medicine

## 2018-09-21 NOTE — Telephone Encounter (Signed)
Patient calling to get a refill on his lantus sololstar 100 units  [facility]

## 2018-09-22 MED ORDER — INSULIN GLARGINE 100 UNIT/ML SOLOSTAR PEN: 64.0000 [IU] | PEN_INJECTOR | Freq: Every day | SUBCUTANEOUS | 3 refills | Status: DC

## 2018-09-22 NOTE — Telephone Encounter (Signed)
Medication called/sent to requested pharmacy  

## 2018-09-27 ENCOUNTER — Ambulatory Visit (INDEPENDENT_AMBULATORY_CARE_PROVIDER_SITE_OTHER): Payer: PPO | Admitting: Family Medicine

## 2018-09-27 ENCOUNTER — Encounter: Payer: Self-pay | Admitting: Family Medicine

## 2018-09-27 VITALS — BP 110/62 | HR 60 | Temp 98.1°F | Resp 14 | Ht 68.0 in | Wt 203.0 lb

## 2018-09-27 DIAGNOSIS — I10 Essential (primary) hypertension: Secondary | ICD-10-CM | POA: Diagnosis not present

## 2018-09-27 DIAGNOSIS — E118 Type 2 diabetes mellitus with unspecified complications: Secondary | ICD-10-CM

## 2018-09-27 DIAGNOSIS — N183 Chronic kidney disease, stage 3 unspecified: Secondary | ICD-10-CM

## 2018-09-27 DIAGNOSIS — Z794 Long term (current) use of insulin: Secondary | ICD-10-CM | POA: Diagnosis not present

## 2018-09-27 DIAGNOSIS — E1342 Other specified diabetes mellitus with diabetic polyneuropathy: Secondary | ICD-10-CM

## 2018-09-27 MED ORDER — PIOGLITAZONE HCL 30 MG PO TABS: 30.0000 mg | ORAL_TABLET | Freq: Every day | ORAL | 3 refills | Status: DC

## 2018-09-27 NOTE — Progress Notes (Signed)
Subjective:    Patient ID: Christian [name], male    DOB: 1935/01/23, 83 y.o.   MRN: [id number]  HPI Patient previously saw my partner and is here to establish with me.  He ambulates using a cane in both arms due to leg weakness.  PMH is significant for DM2 on insulin, stage 3 CKD, hypertension.  Patient is supposed to be taking 64 units of Lantus every day.  However he states that he cannot afford this.  He is only taking 50 units a day in an effort to try to make the insulin last.  He seldom checks his blood sugars however he states that when he does check his blood sugars is greater than 200.  This includes his fasting sugar.  His flu shot is up-to-date.  His pneumonia shot is up-to-date.  He has stage III borderline stage IV chronic kidney disease and therefore metformin is out of the question.  Because cost is an issue, namebrand medications would also be contraindicated and I doubt the glipizide would provide any benefit given the fact he is already on insulin.  Therefore his cheapest alternative would be to try Actos which is listed as an allergy.  However the patient does not recollect the allergy he had to the medication and the only listed reaction is swelling in the legs which is a known side effect.  I believe the Actos may help sensitize him to insulin and thereby better control his blood sugars in a cost effective way.  Patient states that he is willing to try it again  Past Medical History:  Diagnosis Date  . Arthritis   . Diabetes mellitus    Type 2  . Diabetic neuropathy ([place])   . History of kidney stones   . HTN (hypertension) 03/03/2013  . Hypercholesterolemia   . 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 Skin cancer of arm 04/20/2013   left upper arm  . Spinal stenosis 05/17/2014   L3-L4   . Urgency of urination    Past Surgical History:  Procedure Laterality Date  . LUMBAR LAMINECTOMY/DECOMPRESSION MICRODISCECTOMY Bilateral 11/22/2014   Procedure: Laminectomy and  foraminotomy lumbar three-lumbar four bilateral ;  Surgeon: Eustace Moore, MD;  Location: [place] [facility];  Service: Neurosurgery;  Laterality: Bilateral;  . SKIN CANCER EXCISION Left    shoulder and hand   Current Outpatient Medications on File Prior to Visit  Medication Sig Dispense Refill  . acetaminophen (TYLENOL) 500 MG tablet Take 1,000 mg by mouth every 8 (eight) hours as needed for mild pain or moderate pain (back pain).    Marland Kitchen aspirin EC 81 MG tablet Take 81 mg by mouth daily.      . Cetirizine HCl (ZYRTEC ALLERGY) 10 MG CAPS Take once daily as needed for allergy symptoms. (May cause drowsiness) 30 capsule 5  . cholecalciferol (VITAMIN D) 1000 UNITS tablet Take 1,000 Units by mouth daily.      . Cyanocobalamin (B-12 PO) Take 1 tablet by mouth daily.    . fish oil-omega-3 fatty acids 1000 MG capsule Take 3 g by mouth 2 (two) times daily.      Marland Kitchen gabapentin (NEURONTIN) 600 MG tablet TAKE 1 TABLET BY MOUTH THREE TIMES DAILY 90 tablet 3  . HYDROcodone-acetaminophen (NORCO) 7.5-325 MG tablet Take 1 tablet by mouth every 6 (six) hours as needed for moderate pain. 30 tablet 0  . Insulin Glargine (LANTUS SOLOSTAR) 100 UNIT/ML Solostar Pen Inject 64 Units into the skin daily. 3 pen 3  .  Insulin Pen Needle (BD PEN NEEDLE MICRO U/F) 32G X 6 MM MISC Use with insulin pen 100 each 3  . niacin (NIASPAN) 500 MG CR tablet TAKE 1 TABLET BY MOUTH AT BEDTIME 90 tablet 0  . ONE TOUCH ULTRA TEST test strip USE TO CHECK FASTING BLOOD SUGARS THREE TIMES DAILY 100 each 11  . potassium gluconate 595 MG TABS tablet Take 595 mg by mouth daily.    . simvastatin (ZOCOR) 40 MG tablet TAKE 1 TABLET BY MOUTH ONCE DAILY WITH BREAKFAST 90 tablet 0   No current facility-administered medications on file prior to visit.    Allergies  Allergen Reactions  . Actos [Pioglitazone]     LE Edema--03/2014   Social History   Socioeconomic History  . Marital status: Divorced    Spouse name: Not on file  . Number of children: Not  on file  . Years of education: Not on file  . Highest education level: Not on file  Occupational History  . Not on file  Social Needs  . Financial resource strain: Not on file  . Food insecurity:    Worry: Not on file    Inability: Not on file  . Transportation needs:    Medical: Not on file    Non-medical: Not on file  Tobacco Use  . Smoking status: Former Smoker    Last attempt to quit: 03/04/2003    Years since quitting: 15.5  . Smokeless tobacco: Never Used  Substance and Sexual Activity  . Alcohol use: No  . Drug use: No  . Sexual activity: Not on file  Lifestyle  . Physical activity:    Days per week: Not on file    Minutes per session: Not on file  . Stress: Not on file  Relationships  . Social connections:    Talks on phone: Not on file    Gets together: Not on file    Attends religious service: Not on file    Active member of club or organization: Not on file    Attends meetings of clubs or organizations: Not on file    Relationship status: Not on file  . Intimate partner violence:    Fear of current or ex partner: Not on file    Emotionally abused: Not on file    Physically abused: Not on file    Forced sexual activity: Not on file  Other Topics Concern  . Not on file  Social History Narrative  . Not on file      Review of Systems  All other systems reviewed and are negative.      Objective:   Physical Exam Vitals signs reviewed.  Constitutional:      Appearance: Normal appearance.  Cardiovascular:     Rate and Rhythm: Normal rate and regular rhythm.     Heart sounds: Normal heart sounds. No murmur. No friction rub. No gallop.   Pulmonary:     Effort: Pulmonary effort is normal. No respiratory distress.     Breath sounds: Normal breath sounds. No wheezing, rhonchi or rales.  Abdominal:     General: Bowel sounds are normal. There is no distension.     Palpations: Abdomen is soft.     Tenderness: There is no abdominal tenderness. There is no  guarding or rebound.  Musculoskeletal:     Right lower leg: Edema present.     Left lower leg: Edema present.  Neurological:     Mental Status: He is alert.  Trace bipedal edema     Assessment & Plan:  Type 2 diabetes mellitus with complication, with long-term current use of insulin (HCC) - Plan: Hemoglobin A1c, CBC with Differential/Platelet, COMPLETE METABOLIC PANEL WITH GFR, Lipid panel, Microalbumin, urine, Hemoglobin A1c  Essential hypertension  CKD (chronic kidney disease) stage 3, GFR 30-59 ml/min (HCC)  Diabetic polyneuropathy associated with other specified diabetes mellitus ([place])  Blood pressure is acceptable.  The patient is not on an ACE inhibitor or angiotensin receptor blocker despite having chronic kidney disease.  However he states that his nephrologist just started a medication and he is not certain of what the medication is.  He believes it may be lisinopril.  Therefore I hesitate to make any changes in his regimen that may interact with that.  I will check a CBC, CMP, fasting lipid panel, and hemoglobin A1c.  If the patient cannot afford to increase his insulin, I have recommended starting Actos 30 mg a day as an insulin sensitizer and then rechecking his blood sugars in 1 month.  Diabetic foot exam was performed today and is significant for long twisted toenails as well as numbness in his first and second toes bilaterally to 10 g monofilament.  I have recommended a podiatry consult which the patient declined.  He states that he would prefer to trim his nails due to sensitivity.  I also recommended daily foot checks to evaluate for any ulcer development

## 2018-09-28 LAB — CBC WITH DIFFERENTIAL/PLATELET
Absolute Monocytes: 833 cells/uL (ref 200–950)
BASOS ABS: 68 {cells}/uL (ref 0–200)
Basophils Relative: 0.8 %
EOS ABS: 213 {cells}/uL (ref 15–500)
EOS PCT: 2.5 %
HCT: 35.8 % — ABNORMAL LOW (ref 38.5–50.0)
HEMOGLOBIN: 12.4 g/dL — AB (ref 13.2–17.1)
Lymphs Abs: 2729 cells/uL (ref 850–3900)
MCH: 29.5 pg (ref 27.0–33.0)
MCHC: 34.6 g/dL (ref 32.0–36.0)
MCV: 85.2 fL (ref 80.0–100.0)
MONOS PCT: 9.8 %
MPV: 9.8 fL (ref 7.5–12.5)
NEUTROS ABS: 4658 {cells}/uL (ref 1500–7800)
NEUTROS PCT: 54.8 %
PLATELETS: 220 10*3/uL (ref 140–400)
RBC: 4.2 10*6/uL (ref 4.20–5.80)
RDW: 14.4 % (ref 11.0–15.0)
TOTAL LYMPHOCYTE: 32.1 %
WBC: 8.5 10*3/uL (ref 3.8–10.8)

## 2018-09-28 LAB — COMPLETE METABOLIC PANEL WITH GFR
AG RATIO: 1.5 (calc) (ref 1.0–2.5)
ALT: 12 U/L (ref 9–46)
AST: 13 U/L (ref 10–35)
Albumin: 4 g/dL (ref 3.6–5.1)
Alkaline phosphatase (APISO): 65 U/L (ref 40–115)
BUN/Creatinine Ratio: 27 (calc) — ABNORMAL HIGH (ref 6–22)
BUN: 36 mg/dL — ABNORMAL HIGH (ref 7–25)
CALCIUM: 9.7 mg/dL (ref 8.6–10.3)
CHLORIDE: 107 mmol/L (ref 98–110)
CO2: 26 mmol/L (ref 20–32)
Creat: 1.32 mg/dL — ABNORMAL HIGH (ref 0.70–1.11)
GFR, Est African American: 58 mL/min/{1.73_m2} — ABNORMAL LOW (ref >=60)
GFR, Est Non African American: 50 mL/min/{1.73_m2} — ABNORMAL LOW (ref >=60)
GLOBULIN: 2.7 g/dL (ref 1.9–3.7)
Glucose, Bld: 257 mg/dL — ABNORMAL HIGH (ref 65–99)
POTASSIUM: 5 mmol/L (ref 3.5–5.3)
SODIUM: 139 mmol/L (ref 135–146)
Total Bilirubin: 0.4 mg/dL (ref 0.2–1.2)
Total Protein: 6.7 g/dL (ref 6.1–8.1)

## 2018-09-28 LAB — HEMOGLOBIN A1C
EAG (MMOL/L): 11.9 (calc)
Hgb A1c MFr Bld: 9.1 % of total Hgb — ABNORMAL HIGH (ref <5.7)
MEAN PLASMA GLUCOSE: 214 (calc)

## 2018-09-28 LAB — LIPID PANEL
Cholesterol: 128 mg/dL (ref <200)
HDL: 36 mg/dL — ABNORMAL LOW (ref >40)
LDL Cholesterol (Calc): 70 mg/dL (calc)
Non-HDL Cholesterol (Calc): 92 mg/dL (calc) (ref <130)
Total CHOL/HDL Ratio: 3.6 (calc) (ref <5.0)
Triglycerides: 141 mg/dL (ref <150)

## 2018-09-28 LAB — MICROALBUMIN, URINE: Microalb, Ur: 2.2 mg/dL

## 2018-10-05 ENCOUNTER — Encounter: Payer: Self-pay | Admitting: Family Medicine

## 2018-10-14 ENCOUNTER — Other Ambulatory Visit: Payer: Self-pay | Admitting: Family Medicine

## 2018-10-14 MED ORDER — PIOGLITAZONE HCL 30 MG PO TABS: 30.0000 mg | ORAL_TABLET | Freq: Every day | ORAL | 3 refills | Status: DC

## 2018-10-18 ENCOUNTER — Other Ambulatory Visit: Payer: Self-pay | Admitting: *Deleted

## 2018-10-18 MED ORDER — GABAPENTIN 600 MG PO TABS: 600.0000 mg | ORAL_TABLET | Freq: Three times a day (TID) | ORAL | 3 refills | Status: DC

## 2018-10-18 MED ORDER — SIMVASTATIN 40 MG PO TABS: ORAL_TABLET | ORAL | 0 refills | Status: DC

## 2018-10-27 ENCOUNTER — Other Ambulatory Visit: Payer: Self-pay

## 2018-10-27 MED ORDER — NIACIN ER (ANTIHYPERLIPIDEMIC) 500 MG PO TBCR: 500.0000 mg | EXTENDED_RELEASE_TABLET | Freq: Every day | ORAL | 0 refills | Status: DC

## 2018-11-05 ENCOUNTER — Other Ambulatory Visit: Payer: Self-pay

## 2018-11-05 ENCOUNTER — Other Ambulatory Visit: Payer: Self-pay | Admitting: Pharmacist

## 2018-11-05 NOTE — Patient Outreach (Signed)
[facility]) Care Management  11/05/2018  Christian [name] 1935-05-24 [id number]   Receive an [name] message from [place] asking me to reach out to the patient to answer patient's medication question. Left a HIPAA compliant message on the patient's voicemail. If have not heard from patient by next week, will give him another call at that time.  [name], PharmD, [facility] [phone]

## 2018-11-05 NOTE — Patient Outreach (Signed)
[facility]) Care Management  11/05/2018  ARTHOR GORTER [birth year] [id number]   Medication Adherence call to Christian [name] spoke with patient he is due on Lisinopril 5 mg patient pick up his medication on the 4th of each month patient had concern and questions  on one of the medication due to side effects and does not no which one,patient will be refer to Benjamine Mola rph for farther advice and concerns. [name] is sowing past due under [employer].    [facility] Management Direct Dial [phone]  Fax [phone] [email or web address]

## 2018-11-08 ENCOUNTER — Ambulatory Visit (INDEPENDENT_AMBULATORY_CARE_PROVIDER_SITE_OTHER): Payer: PPO | Admitting: Family Medicine

## 2018-11-08 ENCOUNTER — Encounter: Payer: Self-pay | Admitting: Family Medicine

## 2018-11-08 VITALS — BP 120/62 | HR 60 | Temp 97.5°F | Resp 18 | Ht 68.0 in | Wt 212.0 lb

## 2018-11-08 DIAGNOSIS — E118 Type 2 diabetes mellitus with unspecified complications: Secondary | ICD-10-CM | POA: Diagnosis not present

## 2018-11-08 DIAGNOSIS — Z794 Long term (current) use of insulin: Secondary | ICD-10-CM | POA: Diagnosis not present

## 2018-11-08 MED ORDER — METFORMIN HCL 850 MG PO TABS: 850.0000 mg | ORAL_TABLET | Freq: Two times a day (BID) | ORAL | 2 refills | Status: DC

## 2018-11-08 NOTE — Progress Notes (Signed)
Subjective:    Patient ID: Christian [name], male    DOB: 1935-01-29, 84 y.o.   MRN: [id number]  HPI  09/28/19 Patient previously saw my partner and is here to establish with me.  He ambulates using a cane in both arms due to leg weakness.  PMH is significant for DM2 on insulin, stage 3 CKD, hypertension.  Patient is supposed to be taking 64 units of Lantus every day.  However he states that he cannot afford this.  He is only taking 50 units a day in an effort to try to make the insulin last.  He seldom checks his blood sugars however he states that when he does check his blood sugars is greater than 200.  This includes his fasting sugar.  His flu shot is up-to-date.  His pneumonia shot is up-to-date.  He has stage III borderline stage IV chronic kidney disease and therefore metformin is out of the question.  Because cost is an issue, namebrand medications would also be contraindicated and I doubt the glipizide would provide any benefit given the fact he is already on insulin.  Therefore his cheapest alternative would be to try Actos which is listed as an allergy.  However the patient does not recollect the allergy he had to the medication and the only listed reaction is swelling in the legs which is a known side effect.  I believe the Actos may help sensitize him to insulin and thereby better control his blood sugars in a cost effective way.  Patient states that he is willing to try it again.  At that time, my plan was: Blood pressure is acceptable.  The patient is not on an ACE inhibitor or angiotensin receptor blocker despite having chronic kidney disease.  However he states that his nephrologist just started a medication and he is not certain of what the medication is.  He believes it may be lisinopril.  Therefore I hesitate to make any changes in his regimen that may interact with that.  I will check a CBC, CMP, fasting lipid panel, and hemoglobin A1c.  If the patient cannot afford to increase his  insulin, I have recommended starting Actos 30 mg a day as an insulin sensitizer and then rechecking his blood sugars in 1 month.  Diabetic foot exam was performed today and is significant for long twisted toenails as well as numbness in his first and second toes bilaterally to 10 g monofilament.  I have recommended a podiatry consult which the patient declined.  He states that he would prefer to trim his nails due to sensitivity.  I also recommended daily foot checks to evaluate for any ulcer development  11/08/18 HgA1c was 9.1!  My recommendations were: Sugars are out of control. A1c is greater than 9. Would recommend increasing his insulin from 50 units to the previously prescribed 60 units and starting Actos 30 mg a day and then rechecking fasting blood sugars and 2-hour postprandial sugars in 1 month at office visit.  Patient has no recorded sugars for me to evaluate today.  He states that his sugars in the morning can be as low as 80 or as high as 160 however he guesstimates that his average blood sugars around 130.  He denies any hypoglycemia.  He is not checking 2-hour postprandial sugars.  His creatinine was 1.3 with a GFR of 50 mL of blood per minute making him a candidate for metformin despite his chronic kidney disease.  He is unable to afford  any higher dose of his insulin or any namebrand medication to control his blood sugars.  He denies any polyuria, polydipsia, or blurry vision    Past Medical History:  Diagnosis Date  . Arthritis   . Diabetes mellitus    Type 2  . Diabetic neuropathy ([place])   . History of kidney stones   . HTN (hypertension) 03/03/2013  . Hypercholesterolemia   . 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 Skin cancer of arm 04/20/2013   left upper arm  . Spinal stenosis 05/17/2014   L3-L4   . Urgency of urination    Past Surgical History:  Procedure Laterality Date  . LUMBAR LAMINECTOMY/DECOMPRESSION MICRODISCECTOMY Bilateral 11/22/2014   Procedure: Laminectomy  and foraminotomy lumbar three-lumbar four bilateral ;  Surgeon: Eustace Moore, MD;  Location: [facility];  Service: Neurosurgery;  Laterality: Bilateral;  . SKIN CANCER EXCISION Left    shoulder and hand   Current Outpatient Medications on File Prior to Visit  Medication Sig Dispense Refill  . acetaminophen (TYLENOL) 500 MG tablet Take 1,000 mg by mouth every 8 (eight) hours as needed for mild pain or moderate pain (back pain).    Marland Kitchen aspirin EC 81 MG tablet Take 81 mg by mouth daily.      . Cetirizine HCl (ZYRTEC ALLERGY) 10 MG CAPS Take once daily as needed for allergy symptoms. (May cause drowsiness) 30 capsule 5  . cholecalciferol (VITAMIN D) 1000 UNITS tablet Take 1,000 Units by mouth daily.      . Cyanocobalamin (B-12 PO) Take 1 tablet by mouth daily.    . fish oil-omega-3 fatty acids 1000 MG capsule Take 3 g by mouth 2 (two) times daily.      Marland Kitchen gabapentin (NEURONTIN) 600 MG tablet Take 1 tablet (600 mg total) by mouth 3 (three) times daily. 90 tablet 3  . HYDROcodone-acetaminophen (NORCO) 7.5-325 MG tablet Take 1 tablet by mouth every 6 (six) hours as needed for moderate pain. 30 tablet 0  . Insulin Glargine (LANTUS SOLOSTAR) 100 UNIT/ML Solostar Pen Inject 64 Units into the skin daily. (Patient taking differently: Inject 50 Units into the skin daily. ) 3 pen 3  . Insulin Pen Needle (BD PEN NEEDLE MICRO U/F) 32G X 6 MM MISC Use with insulin pen 100 each 3  . niacin (NIASPAN) 500 MG CR tablet Take 1 tablet (500 mg total) by mouth at bedtime. 90 tablet 0  . ONE TOUCH ULTRA TEST test strip USE TO CHECK FASTING BLOOD SUGARS THREE TIMES DAILY 100 each 11  . pioglitazone (ACTOS) 30 MG tablet Take 1 tablet (30 mg total) by mouth daily. 30 tablet 3  . pioglitazone (ACTOS) 30 MG tablet Take 1 tablet (30 mg total) by mouth daily. 30 tablet 3  . potassium gluconate 595 MG TABS tablet Take 595 mg by mouth daily.    . simvastatin (ZOCOR) 40 MG tablet TAKE 1 TABLET BY MOUTH ONCE DAILY WITH BREAKFAST 90  tablet 0   No current facility-administered medications on file prior to visit.    Allergies  Allergen Reactions  . Actos [Pioglitazone]     LE Edema--03/2014   Social History   Socioeconomic History  . Marital status: Divorced    Spouse name: Not on file  . Number of children: Not on file  . Years of education: Not on file  . Highest education level: Not on file  Occupational History  . Not on file  Social Needs  . Financial resource strain: Not on file  .  Food insecurity:    Worry: Not on file    Inability: Not on file  . Transportation needs:    Medical: Not on file    Non-medical: Not on file  Tobacco Use  . Smoking status: Former Smoker    Last attempt to quit: 03/04/2003    Years since quitting: 15.6  . Smokeless tobacco: Never Used  Substance and Sexual Activity  . Alcohol use: No  . Drug use: No  . Sexual activity: Not on file  Lifestyle  . Physical activity:    Days per week: Not on file    Minutes per session: Not on file  . Stress: Not on file  Relationships  . Social connections:    Talks on phone: Not on file    Gets together: Not on file    Attends religious service: Not on file    Active member of club or organization: Not on file    Attends meetings of clubs or organizations: Not on file    Relationship status: Not on file  . Intimate partner violence:    Fear of current or ex partner: Not on file    Emotionally abused: Not on file    Physically abused: Not on file    Forced sexual activity: Not on file  Other Topics Concern  . Not on file  Social History Narrative  . Not on file      Review of Systems  All other systems reviewed and are negative.      Objective:   Physical Exam Vitals signs reviewed.  Constitutional:      Appearance: Normal appearance.  Cardiovascular:     Rate and Rhythm: Normal rate and regular rhythm.     Heart sounds: Normal heart sounds. No murmur. No friction rub. No gallop.   Pulmonary:     Effort:  Pulmonary effort is normal. No respiratory distress.     Breath sounds: Normal breath sounds. No wheezing, rhonchi or rales.  Abdominal:     General: Bowel sounds are normal. There is no distension.     Palpations: Abdomen is soft.     Tenderness: There is no abdominal tenderness. There is no guarding or rebound.  Musculoskeletal:     Right lower leg: Edema present.     Left lower leg: Edema present.  Neurological:     Mental Status: He is alert.      Trace bipedal edema     Assessment & Plan:  Uncontrolled diabetes mellitus type 2  Continue Actos 30 mg a day with Lantus 60 units a day.  Add metformin 850 mg p.o. twice daily and recheck fasting blood sugars and 2-hour postprandial sugars in 1 month.  Monitor renal function closely.  Cautioned the patient about increased risk of lactic acidosis.  Reassess in 1 month.

## 2018-11-10 ENCOUNTER — Other Ambulatory Visit: Payer: Self-pay | Admitting: Pharmacist

## 2018-11-10 NOTE — Patient Outreach (Signed)
[facility]) Care Management  11/10/2018  Christian [name] 09/21/1935 [id number]   Receive an [name] message from [place] asking me to reach out to the patient to answer patient's medication question. Outreach attempt #2. Patient answers the phone, but states that now is not a good time and asks if I can call back on 11/12/18 in the morning.   PLAN  Will attempt to reach patient again on 11/12/18 as requested.  [name], PharmD, [place] [facility] [phone]

## 2018-11-12 ENCOUNTER — Telehealth: Payer: Self-pay | Admitting: Family Medicine

## 2018-11-12 ENCOUNTER — Other Ambulatory Visit: Payer: Self-pay | Admitting: Pharmacist

## 2018-11-12 MED ORDER — LISINOPRIL 5 MG PO TABS: 5.0000 mg | ORAL_TABLET | Freq: Every day | ORAL | 3 refills | Status: AC

## 2018-11-12 NOTE — Patient Outreach (Signed)
[place] [facility]) Care Management  11/12/2018  ED MANDICH 1934-12-14 [id number]   Call to follow up with [place] to determine if Dr. Carolin Sicks discontinued the patient's lisinopril. Leave a message requesting a call back.  Receive a call back from [place] with [facility], who reports that Dr. Carolin Sicks did not discontinue the patient's lisinopril. Reports that patient was last seen by the provider on 09/13/18 and that lisinopril is still listed on the patient's medication list in their chart.  PLAN  Will call to follow up with patient's PCP to verify that he was not instructed by PCP to discontinue the lisinopril.  [name], PharmD, [place] [facility] [phone]

## 2018-11-12 NOTE — Patient Outreach (Signed)
[facility] [facility]) Care Management  [place] - Medication Adherence   11/12/2018  Christian [name] April 18, 1935 [id number]  [facility] Medication: lisinopril 5 mg Date & Supply of last refill: 11/05/18, 30 day supply Current insurance:[employer]   Outreach:   Call to patient in response to [facility] message from [place] asking me to reach out to the patient to answer a medication question. HIPAA identifiers verified.  Subjective:   Christian [name] reports that he is not currently taking lisinopril. Reports that he believes that one of his providers instructed him to stop taking this medication, but that he is not sure by who, his PCP or kidney doctor, or when he was given this instruction.  Review with patient his medications. Patient asks about medications that can cause drowsiness. Counsel patient about drowsiness, dizziness and fall risk with gabapentin and with hydrocodone. Patient denies any dizziness or falls and verbalizes understanding.  Reports that he uses a weekly pillbox to organize his medications. Counsel patient about the importance of medication adherence and strategies to improve his adherence. Patient denies interest in having pill packs at this time.  Patient reports that he has been taking his metformin 850 mg twice daily, pioglitazone 30 mg once daily and Lantus 65 units each morning as directed since his appointment with his PCP on 11/08/18. Denies any issues with side effects. Reports that yesterday his morning fasting blood sugar was 115 mg/dL and that his blood sugar was 155 mg/dL 2 hours after supper.   Reports taking his simvastatin 40 mg once daily as directed.  Patient denies any further medication questions/concerns at this time.  Objective:   Encounter Medications: Outpatient Encounter Medications as of 11/12/2018  Medication Sig  . acetaminophen (TYLENOL) 500 MG tablet Take 1,000 mg by mouth every 8 (eight) hours as  needed for mild pain or moderate pain (back pain).  Marland Kitchen aspirin EC 81 MG tablet Take 81 mg by mouth daily.    . cholecalciferol (VITAMIN D) 1000 UNITS tablet Take 1,000 Units by mouth daily.    . Cyanocobalamin (B-12 PO) Take 1 tablet by mouth daily.  . fish oil-omega-3 fatty acids 1000 MG capsule Take 1 g by mouth 2 (two) times daily.   Marland Kitchen gabapentin (NEURONTIN) 600 MG tablet Take 1 tablet (600 mg total) by mouth 3 (three) times daily.  Marland Kitchen HYDROcodone-acetaminophen (NORCO) 7.5-325 MG tablet Take 1 tablet by mouth every 6 (six) hours as needed for moderate pain.  . Insulin Glargine (LANTUS SOLOSTAR) 100 UNIT/ML Solostar Pen Inject 64 Units into the skin daily. (Patient taking differently: Inject 65 Units into the skin daily. )  . metFORMIN (GLUCOPHAGE) 850 MG tablet Take 1 tablet (850 mg total) by mouth 2 (two) times daily with a meal.  . niacin (NIASPAN) 500 MG CR tablet Take 1 tablet (500 mg total) by mouth at bedtime.  . pioglitazone (ACTOS) 30 MG tablet Take 1 tablet (30 mg total) by mouth daily.  . potassium gluconate 595 MG TABS tablet Take 595 mg by mouth daily.  . simvastatin (ZOCOR) 40 MG tablet TAKE 1 TABLET BY MOUTH ONCE DAILY WITH BREAKFAST  . Insulin Pen Needle (BD PEN NEEDLE MICRO U/F) 32G X 6 MM MISC Use with insulin pen  . lisinopril (PRINIVIL,ZESTRIL) 5 MG tablet   . ONE TOUCH ULTRA TEST test strip USE TO CHECK FASTING BLOOD SUGARS THREE TIMES DAILY   No facility-administered encounter medications on file as of 11/12/2018.  Lab Results  Component Value Date   CREATININE 1.32 (H) 09/27/2018   CREATININE 1.41 (H) 06/16/2018   CREATININE 1.36 (H) 03/15/2018    Lab Results  Component Value Date   HGBA1C 9.1 (H) 09/27/2018    Lipid Panel     Component Value Date/Time   CHOL 128 09/27/2018 1458   TRIG 141 09/27/2018 1458   HDL 36 (L) 09/27/2018 1458   CHOLHDL 3.6 09/27/2018 1458   VLDL 29 05/27/2017 0829   LDLCALC 70 09/27/2018 1458    BP Readings from Last 3  Encounters:  11/08/18 120/62  09/27/18 110/62  06/16/18 (!) 118/50     Assessment:  . Patient not currently taking lisinopril due to concern that he was instructed to stop this medication. Patient last saw PCP on 11/08/18 and there is no mention of discontinuing this medication chart note and medication remains listed on patient's medication list in chart. Patient also seen by Dr. Carolin Sicks at [facility].   Plan:  Will call to follow up with [facility] to determine if Dr. Carolin Sicks discontinued the patient's lisinopril.    [name], PharmD, [place] [facility] [phone]

## 2018-11-12 NOTE — Patient Outreach (Signed)
[place] [facility]) Care Management  11/12/2018  Christian [name] 1935/06/26 [id number]   Call to follow up with patient's PCP office to determine if Dr. Dennard Schaumann discontinued the patient's lisinopril. Leave a message requesting a call back from [place].  [name], PharmD, [place] [facility] [phone]

## 2018-11-12 NOTE — Telephone Encounter (Signed)
Elizabeth from [facility] called and states that she did a med follow up with pt and that we took him off Lisinopril. Per Dr. Dennard Schaumann we did not take him off of that as he needs it to protect his kidneys. Dr. Dennard Schaumann states unless his kidney doctor took him off of it. I called pt and he states that someone called and told him to stop it. When he check the medication he put up it was Lisinopril 10 mg not 5 mg as note from kidney doctor states. Pt is not taking it. Told pt to restart the Lisinopril 5 mg and rx sent to pharm. Called [facility] and [facility] about the medication to make sure they did not DC this. Will await call back.

## 2018-11-12 NOTE — Patient Outreach (Signed)
[place] [facility]) Care Management  11/12/2018  Christian [name] 1935/02/23 [id number]   Receive a call back from [facility] in Dr. Samella Parr office. Lovey Newcomer states that per Dr. Dennard Schaumann, [name] is to continue taking lisinopril 5 mg daily. States that she will call the patient to let him know.  Note that per chart, Dr. Samella Parr office sends in a 90 day supply refill of the lisinopril 5 mg to the patient's pharmacy.  Call to follow up with [name]. Patient confirms that he spoke with [facility] and denies any further medication questions/concerns at this time.  PLAN  Will close pharmacy episode.  [name], PharmD, [place] [facility] [phone]

## 2018-11-15 NOTE — Telephone Encounter (Signed)
Ebony From [facility] called and [facility] that they had stopped his Lisinopril 10mg  and called in 5 mg and this must have been the misunderstanding. Pt is aware to take the 5 mg lisinopril.    Ebony's number in case we need to call her back - [phone] ext. [place]

## 2018-11-24 DIAGNOSIS — M25561 Pain in right knee: Secondary | ICD-10-CM | POA: Diagnosis not present

## 2018-11-24 DIAGNOSIS — M1711 Unilateral primary osteoarthritis, right knee: Secondary | ICD-10-CM | POA: Diagnosis not present

## 2018-11-29 ENCOUNTER — Other Ambulatory Visit: Payer: Self-pay | Admitting: Family Medicine

## 2018-11-29 ENCOUNTER — Ambulatory Visit (INDEPENDENT_AMBULATORY_CARE_PROVIDER_SITE_OTHER): Payer: PPO

## 2018-11-29 DIAGNOSIS — Z23 Encounter for immunization: Secondary | ICD-10-CM

## 2018-11-29 MED ORDER — HYDROCODONE-ACETAMINOPHEN 7.5-325 MG PO TABS: 1.0000 | ORAL_TABLET | Freq: Four times a day (QID) | ORAL | 0 refills | Status: DC | PRN

## 2018-11-29 NOTE — Telephone Encounter (Signed)
Patient is requesting a refill on Hydrocodone   LOV: 11/08/18  LRF:   09/09/18

## 2018-11-29 NOTE — Progress Notes (Signed)
Patient camed in today to receive annual flu vaccine. Patient received flu vaccine in the left deltoid. Patient tolerated well. VIS was given.

## 2018-11-29 NOTE — Telephone Encounter (Signed)
PT NEEDS REFILL ON HYDROCODONE TO [facility].

## 2018-12-07 ENCOUNTER — Encounter: Payer: Self-pay | Admitting: Family Medicine

## 2018-12-07 ENCOUNTER — Ambulatory Visit (INDEPENDENT_AMBULATORY_CARE_PROVIDER_SITE_OTHER): Payer: PPO | Admitting: Family Medicine

## 2018-12-07 VITALS — BP 110/58 | HR 58 | Temp 98.2°F | Resp 18 | Ht 68.0 in | Wt 217.0 lb

## 2018-12-07 DIAGNOSIS — N183 Chronic kidney disease, stage 3 unspecified: Secondary | ICD-10-CM

## 2018-12-07 DIAGNOSIS — Z794 Long term (current) use of insulin: Secondary | ICD-10-CM

## 2018-12-07 DIAGNOSIS — E118 Type 2 diabetes mellitus with unspecified complications: Secondary | ICD-10-CM

## 2018-12-07 NOTE — Progress Notes (Signed)
Subjective:    Patient ID: Christian [name], male    DOB: 1934-11-21, 84 y.o.   MRN: [id number]  HPI  09/28/19 Patient previously saw my partner and is here to establish with me.  He ambulates using a cane in both arms due to leg weakness.  PMH is significant for DM2 on insulin, stage 3 CKD, hypertension.  Patient is supposed to be taking 64 units of Lantus every day.  However he states that he cannot afford this.  He is only taking 50 units a day in an effort to try to make the insulin last.  He seldom checks his blood sugars however he states that when he does check his blood sugars is greater than 200.  This includes his fasting sugar.  His flu shot is up-to-date.  His pneumonia shot is up-to-date.  He has stage III borderline stage IV chronic kidney disease and therefore metformin is out of the question.  Because cost is an issue, namebrand medications would also be contraindicated and I doubt the glipizide would provide any benefit given the fact he is already on insulin.  Therefore his cheapest alternative would be to try Actos which is listed as an allergy.  However the patient does not recollect the allergy he had to the medication and the only listed reaction is swelling in the legs which is a known side effect.  I believe the Actos may help sensitize him to insulin and thereby better control his blood sugars in a cost effective way.  Patient states that he is willing to try it again.  At that time, my plan was: Blood pressure is acceptable.  The patient is not on an ACE inhibitor or angiotensin receptor blocker despite having chronic kidney disease.  However he states that his nephrologist just started a medication and he is not certain of what the medication is.  He believes it may be lisinopril.  Therefore I hesitate to make any changes in his regimen that may interact with that.  I will check a CBC, CMP, fasting lipid panel, and hemoglobin A1c.  If the patient cannot afford to increase his  insulin, I have recommended starting Actos 30 mg a day as an insulin sensitizer and then rechecking his blood sugars in 1 month.  Diabetic foot exam was performed today and is significant for long twisted toenails as well as numbness in his first and second toes bilaterally to 10 g monofilament.  I have recommended a podiatry consult which the patient declined.  He states that he would prefer to trim his nails due to sensitivity.  I also recommended daily foot checks to evaluate for any ulcer development  11/08/18 HgA1c was 9.1!  My recommendations were: Sugars are out of control. A1c is greater than 9. Would recommend increasing his insulin from 50 units to the previously prescribed 60 units and starting Actos 30 mg a day and then rechecking fasting blood sugars and 2-hour postprandial sugars in 1 month at office visit.  Patient has no recorded sugars for me to evaluate today.  He states that his sugars in the morning can be as low as 80 or as high as 160 however he guesstimates that his average blood sugars around 130.  He denies any hypoglycemia.  He is not checking 2-hour postprandial sugars.  His creatinine was 1.3 with a GFR of 50 mL of blood per minute making him a candidate for metformin despite his chronic kidney disease.  He is unable to afford  any higher dose of his insulin or any namebrand medication to control his blood sugars.  He denies any polyuria, polydipsia, or blurry vision.  AT that time, my plan was:  Continue Actos 30 mg a day with Lantus 60 units a day.  Add metformin 850 mg p.o. twice daily and recheck fasting blood sugars and 2-hour postprandial sugars in 1 month.  Monitor renal function closely.  Cautioned the patient about increased risk of lactic acidosis.  Reassess in 1 month.  12/07/18 Here today for follow up.  Since the patient started metformin, his fasting blood sugars have been between 80 and 110 for the last month.  His 2-hour postprandial sugars are between 120 and 150.   This is outstanding.  Unfortunately he is noticed a little bit more swelling in both ankles.  He has trace bipedal edema in both legs today.  He also reports significant diarrhea that has made him want to stop the metformin.  He denies any abdominal pain, melena, hematochezia, fevers or chills.    Past Medical History:  Diagnosis Date  . Arthritis   . Diabetes mellitus    Type 2  . Diabetic neuropathy ([place])   . History of kidney stones   . HTN (hypertension) 03/03/2013  . Hypercholesterolemia   . 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 Skin cancer of arm 04/20/2013   left upper arm  . Spinal stenosis 05/17/2014   L3-L4   . Urgency of urination    Past Surgical History:  Procedure Laterality Date  . LUMBAR LAMINECTOMY/DECOMPRESSION MICRODISCECTOMY Bilateral 11/22/2014   Procedure: Laminectomy and foraminotomy lumbar three-lumbar four bilateral ;  Surgeon: Eustace Moore, MD;  Location: [facility];  Service: Neurosurgery;  Laterality: Bilateral;  . SKIN CANCER EXCISION Left    shoulder and hand   Current Outpatient Medications on File Prior to Visit  Medication Sig Dispense Refill  . acetaminophen (TYLENOL) 500 MG tablet Take 1,000 mg by mouth every 8 (eight) hours as needed for mild pain or moderate pain (back pain).    Marland Kitchen aspirin EC 81 MG tablet Take 81 mg by mouth daily.      . cholecalciferol (VITAMIN D) 1000 UNITS tablet Take 1,000 Units by mouth daily.      . Cyanocobalamin (B-12 PO) Take 1 tablet by mouth daily.    . fish oil-omega-3 fatty acids 1000 MG capsule Take 1 g by mouth 2 (two) times daily.     Marland Kitchen gabapentin (NEURONTIN) 600 MG tablet Take 1 tablet (600 mg total) by mouth 3 (three) times daily. 90 tablet 3  . HYDROcodone-acetaminophen (NORCO) 7.5-325 MG tablet Take 1 tablet by mouth every 6 (six) hours as needed for moderate pain. 30 tablet 0  . Insulin Glargine (LANTUS SOLOSTAR) 100 UNIT/ML Solostar Pen Inject 64 Units into the skin daily. (Patient taking differently:  Inject 65 Units into the skin daily. ) 3 pen 3  . Insulin Pen Needle (BD PEN NEEDLE MICRO U/F) 32G X 6 MM MISC Use with insulin pen 100 each 3  . lisinopril (PRINIVIL,ZESTRIL) 5 MG tablet     . lisinopril (PRINIVIL,ZESTRIL) 5 MG tablet Take 1 tablet (5 mg total) by mouth daily. 90 tablet 3  . metFORMIN (GLUCOPHAGE) 850 MG tablet Take 1 tablet (850 mg total) by mouth 2 (two) times daily with a meal. 60 tablet 2  . niacin (NIASPAN) 500 MG CR tablet Take 1 tablet (500 mg total) by mouth at bedtime. 90 tablet 0  . ONE TOUCH ULTRA TEST  test strip USE TO CHECK FASTING BLOOD SUGARS THREE TIMES DAILY 100 each 11  . pioglitazone (ACTOS) 30 MG tablet Take 1 tablet (30 mg total) by mouth daily. 30 tablet 3  . potassium gluconate 595 MG TABS tablet Take 595 mg by mouth daily.    . simvastatin (ZOCOR) 40 MG tablet TAKE 1 TABLET BY MOUTH ONCE DAILY WITH BREAKFAST 90 tablet 0   No current facility-administered medications on file prior to visit.    Allergies  Allergen Reactions  . Actos [Pioglitazone]     LE Edema--03/2014   Social History   Socioeconomic History  . Marital status: Divorced    Spouse name: Not on file  . Number of children: Not on file  . Years of education: Not on file  . Highest education level: Not on file  Occupational History  . Not on file  Social Needs  . Financial resource strain: Not on file  . Food insecurity:    Worry: Not on file    Inability: Not on file  . Transportation needs:    Medical: Not on file    Non-medical: Not on file  Tobacco Use  . Smoking status: Former Smoker    Last attempt to quit: 03/04/2003    Years since quitting: 15.7  . Smokeless tobacco: Never Used  Substance and Sexual Activity  . Alcohol use: No  . Drug use: No  . Sexual activity: Not on file  Lifestyle  . Physical activity:    Days per week: Not on file    Minutes per session: Not on file  . Stress: Not on file  Relationships  . Social connections:    Talks on phone: Not on  file    Gets together: Not on file    Attends religious service: Not on file    Active member of club or organization: Not on file    Attends meetings of clubs or organizations: Not on file    Relationship status: Not on file  . Intimate partner violence:    Fear of current or ex partner: Not on file    Emotionally abused: Not on file    Physically abused: Not on file    Forced sexual activity: Not on file  Other Topics Concern  . Not on file  Social History Narrative  . Not on file      Review of Systems  All other systems reviewed and are negative.      Objective:   Physical Exam Vitals signs reviewed.  Constitutional:      Appearance: Normal appearance.  Cardiovascular:     Rate and Rhythm: Normal rate and regular rhythm.     Heart sounds: Normal heart sounds. No murmur. No friction rub. No gallop.   Pulmonary:     Effort: Pulmonary effort is normal. No respiratory distress.     Breath sounds: Normal breath sounds. No wheezing, rhonchi or rales.  Abdominal:     General: Bowel sounds are normal. There is no distension.     Palpations: Abdomen is soft.     Tenderness: There is no abdominal tenderness. There is no guarding or rebound.  Musculoskeletal:     Right lower leg: Edema present.     Left lower leg: Edema present.  Neurological:     Mental Status: He is alert.      Trace bipedal edema     Assessment & Plan:  Type 2 diabetes mellitus with complication, with long-term current use of insulin ([facility]) -  Plan: BASIC METABOLIC PANEL WITH GFR  CKD (chronic kidney disease) stage 3, GFR 30-59 ml/min ([facility]) - Plan: BASIC METABOLIC PANEL WITH GFR  Blood sugars are outstanding.  I encouraged the patient to try to take half a metformin tablet once a day.  Once he is tolerating this dose I will go to half a tablet twice a day.  I want him to gradually uptitrate metformin until he is on 850 twice daily.  I encouraged the patient to continue the medication and the diarrhea  should subside.  His blood sugars are outstanding.  The swelling is minimal.  Therefore I would not change any of his medication.  I will check his kidney function to monitor his renal function to ensure his GFR does not drop below 45 mL of blood per minute

## 2018-12-08 DIAGNOSIS — L570 Actinic keratosis: Secondary | ICD-10-CM | POA: Diagnosis not present

## 2018-12-08 DIAGNOSIS — L821 Other seborrheic keratosis: Secondary | ICD-10-CM | POA: Diagnosis not present

## 2018-12-08 DIAGNOSIS — L565 Disseminated superficial actinic porokeratosis (DSAP): Secondary | ICD-10-CM | POA: Diagnosis not present

## 2018-12-08 DIAGNOSIS — D485 Neoplasm of uncertain behavior of skin: Secondary | ICD-10-CM | POA: Diagnosis not present

## 2018-12-08 DIAGNOSIS — Q828 Other specified congenital malformations of skin: Secondary | ICD-10-CM | POA: Diagnosis not present

## 2018-12-08 DIAGNOSIS — L820 Inflamed seborrheic keratosis: Secondary | ICD-10-CM | POA: Diagnosis not present

## 2018-12-08 LAB — BASIC METABOLIC PANEL WITH GFR
BUN/Creatinine Ratio: 20 (calc) (ref 6–22)
BUN: 33 mg/dL — ABNORMAL HIGH (ref 7–25)
CO2: 25 mmol/L (ref 20–32)
Calcium: 9.3 mg/dL (ref 8.6–10.3)
Chloride: 106 mmol/L (ref 98–110)
Creat: 1.68 mg/dL — ABNORMAL HIGH (ref 0.70–1.11)
GFR, Est African American: 43 mL/min/{1.73_m2} — ABNORMAL LOW (ref >=60)
GFR, Est Non African American: 38 mL/min/{1.73_m2} — ABNORMAL LOW (ref >=60)
Glucose, Bld: 132 mg/dL — ABNORMAL HIGH (ref 65–99)
Potassium: 4.8 mmol/L (ref 3.5–5.3)
Sodium: 141 mmol/L (ref 135–146)

## 2018-12-16 DIAGNOSIS — L570 Actinic keratosis: Secondary | ICD-10-CM | POA: Diagnosis not present

## 2018-12-23 DIAGNOSIS — M1711 Unilateral primary osteoarthritis, right knee: Secondary | ICD-10-CM | POA: Diagnosis not present

## 2018-12-28 ENCOUNTER — Ambulatory Visit (INDEPENDENT_AMBULATORY_CARE_PROVIDER_SITE_OTHER): Payer: PPO | Admitting: Family Medicine

## 2018-12-28 ENCOUNTER — Encounter: Payer: Self-pay | Admitting: Family Medicine

## 2018-12-28 ENCOUNTER — Other Ambulatory Visit: Payer: Self-pay

## 2018-12-28 VITALS — BP 118/72 | HR 66 | Temp 97.6°F | Resp 17 | Ht 68.0 in | Wt 215.0 lb

## 2018-12-28 DIAGNOSIS — Z794 Long term (current) use of insulin: Secondary | ICD-10-CM | POA: Diagnosis not present

## 2018-12-28 DIAGNOSIS — E118 Type 2 diabetes mellitus with unspecified complications: Secondary | ICD-10-CM

## 2018-12-28 MED ORDER — FUROSEMIDE 40 MG PO TABS: 40.0000 mg | ORAL_TABLET | Freq: Every day | ORAL | 3 refills | Status: DC | PRN

## 2018-12-28 NOTE — Progress Notes (Signed)
Subjective:    Patient ID: Christian [name], male    DOB: Aug 05, 1935, 84 y.o.   MRN: [id number]  HPI  09/28/19 Patient previously saw my partner and is here to establish with me.  He ambulates using a cane in both arms due to leg weakness.  PMH is significant for DM2 on insulin, stage 3 CKD, hypertension.  Patient is supposed to be taking 64 units of Lantus every day.  However he states that he cannot afford this.  He is only taking 50 units a day in an effort to try to make the insulin last.  He seldom checks his blood sugars however he states that when he does check his blood sugars is greater than 200.  This includes his fasting sugar.  His flu shot is up-to-date.  His pneumonia shot is up-to-date.  He has stage III borderline stage IV chronic kidney disease and therefore metformin is out of the question.  Because cost is an issue, namebrand medications would also be contraindicated and I doubt the glipizide would provide any benefit given the fact he is already on insulin.  Therefore his cheapest alternative would be to try Actos which is listed as an allergy.  However the patient does not recollect the allergy he had to the medication and the only listed reaction is swelling in the legs which is a known side effect.  I believe the Actos may help sensitize him to insulin and thereby better control his blood sugars in a cost effective way.  Patient states that he is willing to try it again.  At that time, my plan was: Blood pressure is acceptable.  The patient is not on an ACE inhibitor or angiotensin receptor blocker despite having chronic kidney disease.  However he states that his nephrologist just started a medication and he is not certain of what the medication is.  He believes it may be lisinopril.  Therefore I hesitate to make any changes in his regimen that may interact with that.  I will check a CBC, CMP, fasting lipid panel, and hemoglobin A1c.  If the patient cannot afford to increase his  insulin, I have recommended starting Actos 30 mg a day as an insulin sensitizer and then rechecking his blood sugars in 1 month.  Diabetic foot exam was performed today and is significant for long twisted toenails as well as numbness in his first and second toes bilaterally to 10 g monofilament.  I have recommended a podiatry consult which the patient declined.  He states that he would prefer to trim his nails due to sensitivity.  I also recommended daily foot checks to evaluate for any ulcer development  11/08/18 HgA1c was 9.1!  My recommendations were: Sugars are out of control. A1c is greater than 9. Would recommend increasing his insulin from 50 units to the previously prescribed 60 units and starting Actos 30 mg a day and then rechecking fasting blood sugars and 2-hour postprandial sugars in 1 month at office visit.  Patient has no recorded sugars for me to evaluate today.  He states that his sugars in the morning can be as low as 80 or as high as 160 however he guesstimates that his average blood sugars around 130.  He denies any hypoglycemia.  He is not checking 2-hour postprandial sugars.  His creatinine was 1.3 with a GFR of 50 mL of blood per minute making him a candidate for metformin despite his chronic kidney disease.  He is unable to afford  any higher dose of his insulin or any namebrand medication to control his blood sugars.  He denies any polyuria, polydipsia, or blurry vision.  AT that time, my plan was:  Continue Actos 30 mg a day with Lantus 60 units a day.  Add metformin 850 mg p.o. twice daily and recheck fasting blood sugars and 2-hour postprandial sugars in 1 month.  Monitor renal function closely.  Cautioned the patient about increased risk of lactic acidosis.  Reassess in 1 month.  12/07/18 Here today for follow up.  Since the patient started metformin, his fasting blood sugars have been between 80 and 110 for the last month.  His 2-hour postprandial sugars are between 120 and 150.   This is outstanding.  Unfortunately he is noticed a little bit more swelling in both ankles.  He has trace bipedal edema in both legs today.  He also reports significant diarrhea that has made him want to stop the metformin.  He denies any abdominal pain, melena, hematochezia, fevers or chills.  At that time, my plan was: Blood sugars are outstanding.  I encouraged the patient to try to take half a metformin tablet once a day.  Once he is tolerating this dose I will go to half a tablet twice a day.  I want him to gradually uptitrate metformin until he is on 850 twice daily.  I encouraged the patient to continue the medication and the diarrhea should subside.  His blood sugars are outstanding.  The swelling is minimal.  Therefore I would not change any of his medication.  I will check his kidney function to monitor his renal function to ensure his GFR does not drop below 45 mL of blood per minute  12/28/18 Patient presents today with leg swelling.  Since his office visit in February, he has gained approximately 3 pounds.  He has +1 pitting edema in his left leg distal to the knee.  He has trace pitting edema in his right leg distal to the knee.  The patient discontinued his metformin and his Actos 3 or 4 days ago because he assumed that 1 of those 2 medications was causing it.  The swelling has improved a little bit but it still not completely gone away.  He denies any chest pain.  He denies any shortness of breath.  He denies any orthopnea.  He denies any palpitations.  He does have faint occasional crackles in his left lower lobe but otherwise his lungs are clear to auscultation bilaterally.  There is no evidence of JVD.  Body habitus makes it difficult to ascertain for any ascites as the patient has a protuberant abdomen.  He denies any oliguria.  He states that he urinates frequently with a strong stream.  He has stopped checking his blood sugars.  Recently his orthopedist informed the patient that he would  only be able to operate on his knee if his hemoglobin A1c was less than 8.  Last hemoglobin A1c in December was 9.1    Past Medical History:  Diagnosis Date  . Arthritis   . Diabetes mellitus    Type 2  . Diabetic neuropathy ([place])   . History of kidney stones   . HTN (hypertension) 03/03/2013  . Hypercholesterolemia   . 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 Skin cancer of arm 04/20/2013   left upper arm  . Spinal stenosis 05/17/2014   L3-L4   . Urgency of urination    Past Surgical History:  Procedure Laterality Date  .  LUMBAR LAMINECTOMY/DECOMPRESSION MICRODISCECTOMY Bilateral 11/22/2014   Procedure: Laminectomy and foraminotomy lumbar three-lumbar four bilateral ;  Surgeon: Eustace Moore, MD;  Location: [facility];  Service: Neurosurgery;  Laterality: Bilateral;  . SKIN CANCER EXCISION Left    shoulder and hand   Current Outpatient Medications on File Prior to Visit  Medication Sig Dispense Refill  . acetaminophen (TYLENOL) 500 MG tablet Take 1,000 mg by mouth every 8 (eight) hours as needed for mild pain or moderate pain (back pain).    Marland Kitchen aspirin EC 81 MG tablet Take 81 mg by mouth daily.      . cholecalciferol (VITAMIN D) 1000 UNITS tablet Take 1,000 Units by mouth daily.      . Cyanocobalamin (B-12 PO) Take 1 tablet by mouth daily.    . fish oil-omega-3 fatty acids 1000 MG capsule Take 1 g by mouth 2 (two) times daily.     Marland Kitchen gabapentin (NEURONTIN) 600 MG tablet Take 1 tablet (600 mg total) by mouth 3 (three) times daily. 90 tablet 3  . HYDROcodone-acetaminophen (NORCO) 7.5-325 MG tablet Take 1 tablet by mouth every 6 (six) hours as needed for moderate pain. 30 tablet 0  . Insulin Glargine (LANTUS SOLOSTAR) 100 UNIT/ML Solostar Pen Inject 64 Units into the skin daily. (Patient taking differently: Inject 65 Units into the skin daily. ) 3 pen 3  . Insulin Pen Needle (BD PEN NEEDLE MICRO U/F) 32G X 6 MM MISC Use with insulin pen 100 each 3  . lisinopril (PRINIVIL,ZESTRIL) 5 MG  tablet Take 1 tablet (5 mg total) by mouth daily. 90 tablet 3  . niacin (NIASPAN) 500 MG CR tablet Take 1 tablet (500 mg total) by mouth at bedtime. 90 tablet 0  . ONE TOUCH ULTRA TEST test strip USE TO CHECK FASTING BLOOD SUGARS THREE TIMES DAILY 100 each 11  . potassium gluconate 595 MG TABS tablet Take 595 mg by mouth daily.    . simvastatin (ZOCOR) 40 MG tablet TAKE 1 TABLET BY MOUTH ONCE DAILY WITH BREAKFAST 90 tablet 0  . metFORMIN (GLUCOPHAGE) 850 MG tablet Take 1 tablet (850 mg total) by mouth 2 (two) times daily with a meal. (Patient not taking: Reported on 12/07/2018) 60 tablet 2  . pioglitazone (ACTOS) 30 MG tablet Take 1 tablet (30 mg total) by mouth daily. (Patient not taking: Reported on 12/28/2018) 30 tablet 3   No current facility-administered medications on file prior to visit.    Allergies  Allergen Reactions  . Actos [Pioglitazone]     LE Edema--03/2014   Social History   Socioeconomic History  . Marital status: Divorced    Spouse name: Not on file  . Number of children: Not on file  . Years of education: Not on file  . Highest education level: Not on file  Occupational History  . Not on file  Social Needs  . Financial resource strain: Not on file  . Food insecurity:    Worry: Not on file    Inability: Not on file  . Transportation needs:    Medical: Not on file    Non-medical: Not on file  Tobacco Use  . Smoking status: Former Smoker    Last attempt to quit: 03/04/2003    Years since quitting: 15.8  . Smokeless tobacco: Never Used  Substance and Sexual Activity  . Alcohol use: No  . Drug use: No  . Sexual activity: Not on file  Lifestyle  . Physical activity:    Days per week:  Not on file    Minutes per session: Not on file  . Stress: Not on file  Relationships  . Social connections:    Talks on phone: Not on file    Gets together: Not on file    Attends religious service: Not on file    Active member of club or organization: Not on file    Attends  meetings of clubs or organizations: Not on file    Relationship status: Not on file  . Intimate partner violence:    Fear of current or ex partner: Not on file    Emotionally abused: Not on file    Physically abused: Not on file    Forced sexual activity: Not on file  Other Topics Concern  . Not on file  Social History Narrative  . Not on file      Review of Systems  All other systems reviewed and are negative.      Objective:   Physical Exam Vitals signs reviewed.  Constitutional:      Appearance: Normal appearance.  Cardiovascular:     Rate and Rhythm: Normal rate and regular rhythm.     Heart sounds: Normal heart sounds. No murmur. No friction rub. No gallop.   Pulmonary:     Effort: Pulmonary effort is normal. No respiratory distress.     Breath sounds: Normal breath sounds. No wheezing, rhonchi or rales.  Abdominal:     General: Bowel sounds are normal. There is no distension.     Palpations: Abdomen is soft.     Tenderness: There is no abdominal tenderness. There is no guarding or rebound.  Musculoskeletal:     Right lower leg: Edema present.     Left lower leg: Edema present.  Neurological:     Mental Status: He is alert.          Assessment & Plan:  Controlled type 2 diabetes mellitus with complication, with long-term current use of insulin ([place]) - Plan: Hemoglobin A1c, COMPLETE METABOLIC PANEL WITH GFR  I have outlined above the decision process that guide [place] to this point.  Unfortunately, the patient would have to switch to a namebrand medication to get off Actos.  Therefore I will give the patient Lasix 40 mg a day for the next 2 to 3 days to alleviate the swelling in his legs.  He can then stop the fluid pill and we can switch the patient to Januvia 100 mg a day.  I would continue metformin.  Check hemoglobin A1c to ensure the medications are controlling his sugars well.  If his hemoglobin A1c is less than 8, he is cleared to proceed with surgery.  If the  patient is unable to afford Januvia, he will have to take Actos in order to control his sugars and at that point he can start Lasix 40 mg daily to control swelling related to the medication.  Await the results of the A1c and then switch the patient to Januvia if he can afford.

## 2018-12-29 ENCOUNTER — Other Ambulatory Visit: Payer: Self-pay | Admitting: Family Medicine

## 2018-12-29 LAB — COMPLETE METABOLIC PANEL WITH GFR
AG Ratio: 1.6 (calc) (ref 1.0–2.5)
ALKALINE PHOSPHATASE (APISO): 52 U/L (ref 35–144)
ALT: 11 U/L (ref 9–46)
AST: 15 U/L (ref 10–35)
Albumin: 4.1 g/dL (ref 3.6–5.1)
BUN / CREAT RATIO: 21 (calc) (ref 6–22)
BUN: 34 mg/dL — ABNORMAL HIGH (ref 7–25)
CALCIUM: 9.5 mg/dL (ref 8.6–10.3)
CO2: 25 mmol/L (ref 20–32)
CREATININE: 1.59 mg/dL — AB (ref 0.70–1.11)
Chloride: 108 mmol/L (ref 98–110)
GFR, Est African American: 46 mL/min/{1.73_m2} — ABNORMAL LOW (ref >=60)
GFR, Est Non African American: 40 mL/min/{1.73_m2} — ABNORMAL LOW (ref >=60)
Globulin: 2.5 g/dL (calc) (ref 1.9–3.7)
Glucose, Bld: 108 mg/dL — ABNORMAL HIGH (ref 65–99)
Potassium: 5.4 mmol/L — ABNORMAL HIGH (ref 3.5–5.3)
Sodium: 142 mmol/L (ref 135–146)
Total Bilirubin: 0.4 mg/dL (ref 0.2–1.2)
Total Protein: 6.6 g/dL (ref 6.1–8.1)

## 2018-12-29 LAB — HEMOGLOBIN A1C
Hgb A1c MFr Bld: 6.3 % of total Hgb — ABNORMAL HIGH (ref <5.7)
Mean Plasma Glucose: 134 (calc)
eAG (mmol/L): 7.4 (calc)

## 2018-12-29 MED ORDER — SITAGLIPTIN PHOSPHATE 100 MG PO TABS: 100.0000 mg | ORAL_TABLET | Freq: Every day | ORAL | 5 refills | Status: DC

## 2019-01-13 DIAGNOSIS — K644 Residual hemorrhoidal skin tags: Secondary | ICD-10-CM | POA: Diagnosis not present

## 2019-01-13 DIAGNOSIS — Z0000 Encounter for general adult medical examination without abnormal findings: Secondary | ICD-10-CM | POA: Diagnosis not present

## 2019-01-13 DIAGNOSIS — Z23 Encounter for immunization: Secondary | ICD-10-CM | POA: Diagnosis not present

## 2019-01-13 DIAGNOSIS — M818 Other osteoporosis without current pathological fracture: Secondary | ICD-10-CM | POA: Diagnosis not present

## 2019-01-13 DIAGNOSIS — M978XXD Periprosthetic fracture around other internal prosthetic joint, subsequent encounter: Secondary | ICD-10-CM | POA: Diagnosis not present

## 2019-01-13 DIAGNOSIS — H748X2 Other specified disorders of left middle ear and mastoid: Secondary | ICD-10-CM | POA: Diagnosis not present

## 2019-01-13 DIAGNOSIS — Z125 Encounter for screening for malignant neoplasm of prostate: Secondary | ICD-10-CM | POA: Diagnosis not present

## 2019-01-13 DIAGNOSIS — R3911 Hesitancy of micturition: Secondary | ICD-10-CM | POA: Diagnosis not present

## 2019-01-13 DIAGNOSIS — Z96643 Presence of artificial hip joint, bilateral: Secondary | ICD-10-CM | POA: Diagnosis not present

## 2019-01-13 DIAGNOSIS — Z800 Family history of malignant neoplasm of digestive organs: Secondary | ICD-10-CM | POA: Diagnosis not present

## 2019-01-13 DIAGNOSIS — Z87891 Personal history of nicotine dependence: Secondary | ICD-10-CM | POA: Diagnosis not present

## 2019-01-13 DIAGNOSIS — H353212 Exudative age-related macular degeneration, right eye, with inactive choroidal neovascularization: Secondary | ICD-10-CM | POA: Diagnosis not present

## 2019-01-13 DIAGNOSIS — Z720 Tobacco use: Secondary | ICD-10-CM | POA: Diagnosis not present

## 2019-01-13 DIAGNOSIS — K760 Fatty (change of) liver, not elsewhere classified: Secondary | ICD-10-CM | POA: Diagnosis not present

## 2019-01-13 DIAGNOSIS — Z1389 Encounter for screening for other disorder: Secondary | ICD-10-CM | POA: Diagnosis not present

## 2019-01-13 DIAGNOSIS — N189 Chronic kidney disease, unspecified: Secondary | ICD-10-CM | POA: Diagnosis not present

## 2019-01-13 DIAGNOSIS — H353123 Nonexudative age-related macular degeneration, left eye, advanced atrophic without subfoveal involvement: Secondary | ICD-10-CM | POA: Diagnosis not present

## 2019-01-13 DIAGNOSIS — J329 Chronic sinusitis, unspecified: Secondary | ICD-10-CM | POA: Diagnosis not present

## 2019-01-13 DIAGNOSIS — E039 Hypothyroidism, unspecified: Secondary | ICD-10-CM | POA: Diagnosis not present

## 2019-01-13 DIAGNOSIS — I10 Essential (primary) hypertension: Secondary | ICD-10-CM | POA: Diagnosis not present

## 2019-01-13 DIAGNOSIS — N183 Chronic kidney disease, stage 3 (moderate): Secondary | ICD-10-CM | POA: Diagnosis not present

## 2019-01-13 DIAGNOSIS — K8020 Calculus of gallbladder without cholecystitis without obstruction: Secondary | ICD-10-CM | POA: Diagnosis not present

## 2019-01-13 DIAGNOSIS — R519 Headache, unspecified: Secondary | ICD-10-CM | POA: Diagnosis not present

## 2019-01-13 DIAGNOSIS — I4891 Unspecified atrial fibrillation: Secondary | ICD-10-CM | POA: Diagnosis not present

## 2019-01-13 DIAGNOSIS — G4700 Insomnia, unspecified: Secondary | ICD-10-CM | POA: Diagnosis not present

## 2019-01-13 DIAGNOSIS — Z20828 Contact with and (suspected) exposure to other viral communicable diseases: Secondary | ICD-10-CM | POA: Diagnosis not present

## 2019-01-13 DIAGNOSIS — H9202 Otalgia, left ear: Secondary | ICD-10-CM | POA: Diagnosis not present

## 2019-01-13 DIAGNOSIS — Z6828 Body mass index (BMI) 28.0-28.9, adult: Secondary | ICD-10-CM | POA: Diagnosis not present

## 2019-01-19 ENCOUNTER — Other Ambulatory Visit: Payer: Self-pay | Admitting: Family Medicine

## 2019-01-26 ENCOUNTER — Other Ambulatory Visit: Payer: Self-pay | Admitting: Family Medicine

## 2019-02-16 ENCOUNTER — Other Ambulatory Visit: Payer: Self-pay | Admitting: Family Medicine

## 2019-02-23 ENCOUNTER — Other Ambulatory Visit: Payer: Self-pay | Admitting: Family Medicine

## 2019-02-23 NOTE — Telephone Encounter (Signed)
Refill on hydrocodone to [facility].

## 2019-02-23 NOTE — Telephone Encounter (Signed)
Patient is requesting a refill on Hydrocodone   LOV: 12/28/18  LRF:  11/29/18

## 2019-02-24 MED ORDER — HYDROCODONE-ACETAMINOPHEN 7.5-325 MG PO TABS: 1.0000 | ORAL_TABLET | Freq: Four times a day (QID) | ORAL | 0 refills | Status: DC | PRN

## 2019-03-04 ENCOUNTER — Other Ambulatory Visit: Payer: Self-pay | Admitting: Family Medicine

## 2019-03-04 NOTE — Telephone Encounter (Signed)
Requested Prescriptions   Pending Prescriptions Disp Refills  . gabapentin (NEURONTIN) 600 MG tablet [Pharmacy Med Name: Gabapentin 600 MG Oral Tablet] 90 tablet 0    Sig: TAKE 1 TABLET BY MOUTH THREE TIMES DAILY   Last OV 12/28/2018 Last written 10/18/2018

## 2019-03-10 ENCOUNTER — Ambulatory Visit: Payer: PPO | Admitting: Family Medicine

## 2019-03-15 ENCOUNTER — Ambulatory Visit (INDEPENDENT_AMBULATORY_CARE_PROVIDER_SITE_OTHER): Payer: PPO | Admitting: Family Medicine

## 2019-03-15 ENCOUNTER — Encounter: Payer: Self-pay | Admitting: Family Medicine

## 2019-03-15 ENCOUNTER — Other Ambulatory Visit: Payer: Self-pay

## 2019-03-15 VITALS — BP 110/56 | HR 66 | Temp 98.4°F | Resp 18 | Ht 68.0 in | Wt 221.0 lb

## 2019-03-15 DIAGNOSIS — Z794 Long term (current) use of insulin: Secondary | ICD-10-CM

## 2019-03-15 DIAGNOSIS — N183 Chronic kidney disease, stage 3 unspecified: Secondary | ICD-10-CM

## 2019-03-15 DIAGNOSIS — E118 Type 2 diabetes mellitus with unspecified complications: Secondary | ICD-10-CM | POA: Diagnosis not present

## 2019-03-15 NOTE — Progress Notes (Signed)
Subjective:    Patient ID: Christian [name], male    DOB: 12-06-1934, 84 y.o.   MRN: [id number]  HPI  09/28/19 Patient previously saw my partner and is here to establish with me.  He ambulates using a cane in both arms due to leg weakness.  PMH is significant for DM2 on insulin, stage 3 CKD, hypertension.  Patient is supposed to be taking 64 units of Lantus every day.  However he states that he cannot afford this.  He is only taking 50 units a day in an effort to try to make the insulin last.  He seldom checks his blood sugars however he states that when he does check his blood sugars is greater than 200.  This includes his fasting sugar.  His flu shot is up-to-date.  His pneumonia shot is up-to-date.  He has stage III borderline stage IV chronic kidney disease and therefore metformin is out of the question.  Because cost is an issue, namebrand medications would also be contraindicated and I doubt the glipizide would provide any benefit given the fact he is already on insulin.  Therefore his cheapest alternative would be to try Actos which is listed as an allergy.  However the patient does not recollect the allergy he had to the medication and the only listed reaction is swelling in the legs which is a known side effect.  I believe the Actos may help sensitize him to insulin and thereby better control his blood sugars in a cost effective way.  Patient states that he is willing to try it again.  At that time, my plan was: Blood pressure is acceptable.  The patient is not on an ACE inhibitor or angiotensin receptor blocker despite having chronic kidney disease.  However he states that his nephrologist just started a medication and he is not certain of what the medication is.  He believes it may be lisinopril.  Therefore I hesitate to make any changes in his regimen that may interact with that.  I will check a CBC, CMP, fasting lipid panel, and hemoglobin A1c.  If the patient cannot afford to increase his  insulin, I have recommended starting Actos 30 mg a day as an insulin sensitizer and then rechecking his blood sugars in 1 month.  Diabetic foot exam was performed today and is significant for long twisted toenails as well as numbness in his first and second toes bilaterally to 10 g monofilament.  I have recommended a podiatry consult which the patient declined.  He states that he would prefer to trim his nails due to sensitivity.  I also recommended daily foot checks to evaluate for any ulcer development  11/08/18 HgA1c was 9.1!  My recommendations were: Sugars are out of control. A1c is greater than 9. Would recommend increasing his insulin from 50 units to the previously prescribed 60 units and starting Actos 30 mg a day and then rechecking fasting blood sugars and 2-hour postprandial sugars in 1 month at office visit.  Patient has no recorded sugars for me to evaluate today.  He states that his sugars in the morning can be as low as 80 or as high as 160 however he guesstimates that his average blood sugars around 130.  He denies any hypoglycemia.  He is not checking 2-hour postprandial sugars.  His creatinine was 1.3 with a GFR of 50 mL of blood per minute making him a candidate for metformin despite his chronic kidney disease.  He is unable to afford  any higher dose of his insulin or any namebrand medication to control his blood sugars.  He denies any polyuria, polydipsia, or blurry vision.  AT that time, my plan was:  Continue Actos 30 mg a day with Lantus 60 units a day.  Add metformin 850 mg p.o. twice daily and recheck fasting blood sugars and 2-hour postprandial sugars in 1 month.  Monitor renal function closely.  Cautioned the patient about increased risk of lactic acidosis.  Reassess in 1 month.  12/07/18 Here today for follow up.  Since the patient started metformin, his fasting blood sugars have been between 80 and 110 for the last month.  His 2-hour postprandial sugars are between 120 and 150.   This is outstanding.  Unfortunately he is noticed a little bit more swelling in both ankles.  He has trace bipedal edema in both legs today.  He also reports significant diarrhea that has made him want to stop the metformin.  He denies any abdominal pain, melena, hematochezia, fevers or chills.  At that time, my plan was: Blood sugars are outstanding.  I encouraged the patient to try to take half a metformin tablet once a day.  Once he is tolerating this dose I will go to half a tablet twice a day.  I want him to gradually uptitrate metformin until he is on 850 twice daily.  I encouraged the patient to continue the medication and the diarrhea should subside.  His blood sugars are outstanding.  The swelling is minimal.  Therefore I would not change any of his medication.  I will check his kidney function to monitor his renal function to ensure his GFR does not drop below 45 mL of blood per minute  12/28/18 Patient presents today with leg swelling.  Since his office visit in February, he has gained approximately 3 pounds.  He has +1 pitting edema in his left leg distal to the knee.  He has trace pitting edema in his right leg distal to the knee.  The patient discontinued his metformin and his Actos 3 or 4 days ago because he assumed that 1 of those 2 medications was causing it.  The swelling has improved a little bit but it still not completely gone away.  He denies any chest pain.  He denies any shortness of breath.  He denies any orthopnea.  He denies any palpitations.  He does have faint occasional crackles in his left lower lobe but otherwise his lungs are clear to auscultation bilaterally.  There is no evidence of JVD.  Body habitus makes it difficult to ascertain for any ascites as the patient has a protuberant abdomen.  He denies any oliguria.  He states that he urinates frequently with a strong stream.  He has stopped checking his blood sugars.  Recently his orthopedist informed the patient that he would  only be able to operate on his knee if his hemoglobin A1c was less than 8.  Last hemoglobin A1c in December was 9.1.  At that time, my plan was: I have outlined above the decision process that guide [place] to this point.  Unfortunately, the patient would have to switch to a namebrand medication to get off Actos.  Therefore I will give the patient Lasix 40 mg a day for the next 2 to 3 days to alleviate the swelling in his legs.  He can then stop the fluid pill and we can switch the patient to Januvia 100 mg a day.  I would continue metformin.  Check hemoglobin A1c to ensure the medications are controlling his sugars well.  If his hemoglobin A1c is less than 8, he is cleared to proceed with surgery.  If the patient is unable to afford Januvia, he will have to take Actos in order to control his sugars and at that point he can start Lasix 40 mg daily to control swelling related to the medication.  Await the results of the A1c and then switch the patient to Januvia if he can afford.  03/15/19 At the patient's last visit, his hemoglobin A1c was 6.3.  Due to his leg swelling and his excellent A1c, I recommended that he discontinue Actos and replace it with Januvia 100 mg a day.  Today the patient is uncertain of the medication he is taking.  He believes he may be taking Actos still.  He also thinks that he may be taking Januvia as well.  He states that the highest his sugars have been in the last 2 weeks is 140.  He denies any hypoglycemic episodes.  He continues to have +1 pitting edema in both legs. Wt Readings from Last 3 Encounters:  03/15/19 221 lb (100.2 kg)  12/28/18 215 lb (97.5 kg)  12/07/18 217 lb (98.4 kg)   His weight is no better than when I last saw him and in fact is 6 pounds heavier.  All this leads me to believe that he did not stop the Actos as directed.  He denies any shortness of breath.  He denies any chest pain.  He denies any orthopnea.  He denies any blurry vision.  He denies any neuropathy in  his feet.    Past Medical History:  Diagnosis Date   Arthritis    Diabetes mellitus    Type 2   Diabetic neuropathy ([place])    History of kidney stones    HTN (hypertension) 03/03/2013   Hypercholesterolemia    Neurogenic claudication 05/17/2014   Skin cancer of arm 04/20/2013   left upper arm   Spinal stenosis 05/17/2014   L3-L4    Urgency of urination    Past Surgical History:  Procedure Laterality Date   LUMBAR LAMINECTOMY/DECOMPRESSION MICRODISCECTOMY Bilateral 11/22/2014   Procedure: Laminectomy and foraminotomy lumbar three-lumbar four bilateral ;  Surgeon: Eustace Moore, MD;  Location: [facility];  Service: Neurosurgery;  Laterality: Bilateral;   SKIN CANCER EXCISION Left    shoulder and hand   Current Outpatient Medications on File Prior to Visit  Medication Sig Dispense Refill   acetaminophen (TYLENOL) 500 MG tablet Take 1,000 mg by mouth every 8 (eight) hours as needed for mild pain or moderate pain (back pain).     aspirin EC 81 MG tablet Take 81 mg by mouth daily.       cholecalciferol (VITAMIN D) 1000 UNITS tablet Take 1,000 Units by mouth daily.       Cyanocobalamin (B-12 PO) Take 1 tablet by mouth daily.     fish oil-omega-3 fatty acids 1000 MG capsule Take 1 g by mouth 2 (two) times daily.      furosemide (LASIX) 40 MG tablet Take 1 tablet (40 mg total) by mouth daily as needed for fluid. 30 tablet 3   gabapentin (NEURONTIN) 600 MG tablet TAKE 1 TABLET BY MOUTH THREE TIMES DAILY 90 tablet 0   HYDROcodone-acetaminophen (NORCO) 7.5-325 MG tablet Take 1 tablet by mouth every 6 (six) hours as needed for moderate pain. 30 tablet 0   Insulin Pen Needle (BD PEN NEEDLE MICRO  U/F) 32G X 6 MM MISC Use with insulin pen 100 each 3   LANTUS SOLOSTAR 100 UNIT/ML Solostar Pen INJECT 64 UNITS INTO THE SKIN DAILY (Patient taking differently: Inject 60 Units into the skin daily. ) 15 mL 2   lisinopril (PRINIVIL,ZESTRIL) 5 MG tablet Take 1 tablet (5 mg total)  by mouth daily. 90 tablet 3   metFORMIN (GLUCOPHAGE) 850 MG tablet Take 1 tablet (850 mg total) by mouth 2 (two) times daily with a meal. 60 tablet 2   niacin (NIASPAN) 500 MG CR tablet TAKE 1 TABLET BY MOUTH AT BEDTIME 90 tablet 0   ONE TOUCH ULTRA TEST test strip USE TO CHECK FASTING BLOOD SUGARS THREE TIMES DAILY 100 each 11   potassium gluconate 595 MG TABS tablet Take 595 mg by mouth daily.     simvastatin (ZOCOR) 40 MG tablet Take 1 tablet by mouth once daily with breakfast 90 tablet 0   sitaGLIPtin (JANUVIA) 100 MG tablet Take 1 tablet (100 mg total) by mouth daily. 30 tablet 5   No current facility-administered medications on file prior to visit.    Allergies  Allergen Reactions   Actos [Pioglitazone]     LE Edema--03/2014   Social History   Socioeconomic History   Marital status: Divorced    Spouse name: Not on file   Number of children: Not on file   Years of education: Not on file   Highest education level: Not on file  Occupational History   Not on file  Social Needs   Financial resource strain: Not on file   Food insecurity:    Worry: Not on file    Inability: Not on file   Transportation needs:    Medical: Not on file    Non-medical: Not on file  Tobacco Use   Smoking status: Former Smoker    Last attempt to quit: 03/04/2003    Years since quitting: 16.0   Smokeless tobacco: Never Used  Substance and Sexual Activity   Alcohol use: No   Drug use: No   Sexual activity: Not on file  Lifestyle   Physical activity:    Days per week: Not on file    Minutes per session: Not on file   Stress: Not on file  Relationships   Social connections:    Talks on phone: Not on file    Gets together: Not on file    Attends religious service: Not on file    Active member of club or organization: Not on file    Attends meetings of clubs or organizations: Not on file    Relationship status: Not on file   Intimate partner violence:    Fear of  current or ex partner: Not on file    Emotionally abused: Not on file    Physically abused: Not on file    Forced sexual activity: Not on file  Other Topics Concern   Not on file  Social History Narrative   Not on file      Review of Systems  All other systems reviewed and are negative.      Objective:   Physical Exam Vitals signs reviewed.  Constitutional:      Appearance: Normal appearance.  Cardiovascular:     Rate and Rhythm: Normal rate and regular rhythm.     Heart sounds: Normal heart sounds. No murmur. No friction rub. No gallop.   Pulmonary:     Effort: Pulmonary effort is normal. No respiratory distress.  Breath sounds: Normal breath sounds. No wheezing, rhonchi or rales.  Abdominal:     General: Bowel sounds are normal. There is no distension.     Palpations: Abdomen is soft.     Tenderness: There is no abdominal tenderness. There is no guarding or rebound.  Musculoskeletal:     Right lower leg: Edema present.     Left lower leg: Edema present.  Neurological:     Mental Status: He is alert.          Assessment & Plan:  Type 2 diabetes mellitus with complication, with long-term current use of insulin (HCC) - Plan: Hemoglobin A1c, COMPLETE METABOLIC PANEL WITH GFR, Lipid panel  CKD (chronic kidney disease) stage 3, GFR 30-59 ml/min (HCC)  Patient's blood pressure is excellent.  Given his chronic kidney disease, I will monitor his kidney function closely given the fact he is on metformin.  My intention was to have the patient discontinue Actos and replace it with Januvia 100 mg.  Patient is uncertain of his medication but it sounds like he may be taking both medicines.  His increased weight and his pitting edema would certainly support that.  I want the patient to go home today his medication list and call us back and tell us exactly what he is taking.  If he is still taking pioglitazone, I want him to discontinue that immediately.  If he is not taking  pioglitazone, we may need to increase his Lasix

## 2019-03-16 ENCOUNTER — Telehealth: Payer: Self-pay | Admitting: Family Medicine

## 2019-03-16 LAB — COMPLETE METABOLIC PANEL WITH GFR
AG Ratio: 1.7 (calc) (ref 1.0–2.5)
ALT: 14 U/L (ref 9–46)
AST: 15 U/L (ref 10–35)
Albumin: 3.8 g/dL (ref 3.6–5.1)
Alkaline phosphatase (APISO): 63 U/L (ref 35–144)
BUN/Creatinine Ratio: 23 (calc) — ABNORMAL HIGH (ref 6–22)
BUN: 34 mg/dL — ABNORMAL HIGH (ref 7–25)
CO2: 22 mmol/L (ref 20–32)
Calcium: 9.2 mg/dL (ref 8.6–10.3)
Chloride: 109 mmol/L (ref 98–110)
Creat: 1.51 mg/dL — ABNORMAL HIGH (ref 0.70–1.11)
GFR, Est African American: 49 mL/min/{1.73_m2} — ABNORMAL LOW (ref >=60)
GFR, Est Non African American: 42 mL/min/{1.73_m2} — ABNORMAL LOW (ref >=60)
Globulin: 2.3 g/dL (calc) (ref 1.9–3.7)
Glucose, Bld: 92 mg/dL (ref 65–99)
Potassium: 5 mmol/L (ref 3.5–5.3)
Sodium: 142 mmol/L (ref 135–146)
Total Bilirubin: 0.4 mg/dL (ref 0.2–1.2)
Total Protein: 6.1 g/dL (ref 6.1–8.1)

## 2019-03-16 LAB — LIPID PANEL
Cholesterol: 122 mg/dL (ref <200)
HDL: 48 mg/dL (ref >=40)
LDL Cholesterol (Calc): 60 mg/dL (calc)
Non-HDL Cholesterol (Calc): 74 mg/dL (calc) (ref <130)
Total CHOL/HDL Ratio: 2.5 (calc) (ref <5.0)
Triglycerides: 59 mg/dL (ref <150)

## 2019-03-16 LAB — HEMOGLOBIN A1C
Hgb A1c MFr Bld: 5.7 % of total Hgb — ABNORMAL HIGH (ref <5.7)
Mean Plasma Glucose: 117 (calc)
eAG (mmol/L): 6.5 (calc)

## 2019-03-16 NOTE — Telephone Encounter (Signed)
Error

## 2019-03-17 ENCOUNTER — Other Ambulatory Visit: Payer: Self-pay | Admitting: Family Medicine

## 2019-03-21 DIAGNOSIS — I129 Hypertensive chronic kidney disease with stage 1 through stage 4 chronic kidney disease, or unspecified chronic kidney disease: Secondary | ICD-10-CM | POA: Diagnosis not present

## 2019-03-21 DIAGNOSIS — N183 Chronic kidney disease, stage 3 (moderate): Secondary | ICD-10-CM | POA: Diagnosis not present

## 2019-03-21 DIAGNOSIS — E559 Vitamin D deficiency, unspecified: Secondary | ICD-10-CM | POA: Diagnosis not present

## 2019-03-21 DIAGNOSIS — D631 Anemia in chronic kidney disease: Secondary | ICD-10-CM | POA: Diagnosis not present

## 2019-03-30 ENCOUNTER — Other Ambulatory Visit: Payer: Self-pay

## 2019-03-30 ENCOUNTER — Encounter
Admission: RE | Admit: 2019-03-30 | Discharge: 2019-03-30 | Disposition: A | Discharge status: Home / Self Care | Payer: PPO | Source: Ambulatory Visit | Attending: Orthopedic Surgery | Admitting: Orthopedic Surgery

## 2019-03-30 DIAGNOSIS — Z01818 Encounter for other preprocedural examination: Secondary | ICD-10-CM | POA: Diagnosis not present

## 2019-03-30 DIAGNOSIS — E119 Type 2 diabetes mellitus without complications: Secondary | ICD-10-CM | POA: Insufficient documentation

## 2019-03-30 HISTORY — DX: Localized edema: R60.0

## 2019-03-30 HISTORY — DX: Dorsalgia, unspecified: M54.9

## 2019-03-30 HISTORY — DX: Cramp and spasm: R25.2

## 2019-03-30 LAB — COMPREHENSIVE METABOLIC PANEL
ALT: 14 U/L (ref 0–44)
AST: 21 U/L (ref 15–41)
Albumin: 3.8 g/dL (ref 3.5–5.0)
Alkaline Phosphatase: 50 U/L (ref 38–126)
Anion gap: 9 (ref 5–15)
BUN: 26 mg/dL — ABNORMAL HIGH (ref 8–23)
CO2: 21 mmol/L — ABNORMAL LOW (ref 22–32)
Calcium: 9.2 mg/dL (ref 8.9–10.3)
Chloride: 111 mmol/L (ref 98–111)
Creatinine, Ser: 1.41 mg/dL — ABNORMAL HIGH (ref 0.61–1.24)
GFR calc Af Amer: 53 mL/min — ABNORMAL LOW (ref >60)
GFR calc non Af Amer: 46 mL/min — ABNORMAL LOW (ref >60)
Glucose, Bld: 77 mg/dL (ref 70–99)
Potassium: 4.1 mmol/L (ref 3.5–5.1)
Sodium: 141 mmol/L (ref 135–145)
Total Bilirubin: 0.6 mg/dL (ref 0.3–1.2)
Total Protein: 6.9 g/dL (ref 6.5–8.1)

## 2019-03-30 LAB — SURGICAL PCR SCREEN
MRSA, PCR: POSITIVE — AB
Staphylococcus aureus: POSITIVE — AB

## 2019-03-30 LAB — CBC
HCT: 33.4 % — ABNORMAL LOW (ref 39.0–52.0)
Hemoglobin: 11.1 g/dL — ABNORMAL LOW (ref 13.0–17.0)
MCH: 29.4 pg (ref 26.0–34.0)
MCHC: 33.2 g/dL (ref 30.0–36.0)
MCV: 88.4 fL (ref 80.0–100.0)
Platelets: 193 10*3/uL (ref 150–400)
RBC: 3.78 MIL/uL — ABNORMAL LOW (ref 4.22–5.81)
RDW: 14.6 % (ref 11.5–15.5)
WBC: 8.8 10*3/uL (ref 4.0–10.5)
nRBC: 0 % (ref 0.0–0.2)

## 2019-03-30 LAB — URINALYSIS, ROUTINE W REFLEX MICROSCOPIC
Bilirubin Urine: NEGATIVE
Glucose, UA: NEGATIVE mg/dL
Hgb urine dipstick: NEGATIVE
Ketones, ur: NEGATIVE mg/dL
Leukocytes,Ua: NEGATIVE
Nitrite: NEGATIVE
Protein, ur: NEGATIVE mg/dL
Specific Gravity, Urine: 1.009 (ref 1.005–1.030)
pH: 5 (ref 5.0–8.0)

## 2019-03-30 LAB — APTT: aPTT: 36 seconds (ref 24–36)

## 2019-03-30 LAB — TYPE AND SCREEN
ABO/RH(D): O POS
Antibody Screen: NEGATIVE

## 2019-03-30 LAB — HEMOGLOBIN A1C
Hgb A1c MFr Bld: 5.7 % — ABNORMAL HIGH (ref 4.8–5.6)
Mean Plasma Glucose: 116.89 mg/dL

## 2019-03-30 LAB — C-REACTIVE PROTEIN: CRP: <0.8 mg/dL (ref <1.0)

## 2019-03-30 LAB — SEDIMENTATION RATE: Sed Rate: 44 mm/hr — ABNORMAL HIGH (ref 0–20)

## 2019-03-30 LAB — PROTIME-INR
INR: 1.1 (ref 0.8–1.2)
Prothrombin Time: 14 seconds (ref 11.4–15.2)

## 2019-03-30 NOTE — Pre-Procedure Instructions (Signed)
EKG OK BY DR PENWARDEN 

## 2019-03-30 NOTE — Patient Instructions (Signed)
Your procedure is scheduled on: 04/11/2019 Mon Report to Same Day Surgery 2nd floor medical mall [facility] Entrance-take elevator on left to 2nd floor.  Check in with surgery information desk.) To find out your arrival time please call [phone] between 1PM - 3PM on 04/08/2019  Remember: Instructions that are not followed completely may result in serious medical risk, up to and including death, or upon the discretion of your surgeon and anesthesiologist your surgery may need to be rescheduled.    _x___ 1. Do not eat food after midnight the night before your procedure. You may drink clear liquids up to 2 hours before you are scheduled to arrive at the hospital for your procedure.  Do not drink clear liquids within 2 hours of your scheduled arrival to the hospital.  Clear liquids include  --Water or Apple juice without pulp  --Clear carbohydrate beverage such as ClearFast or Gatorade  --Black Coffee or Clear Tea (No milk, no creamers, do not add anything to                  the coffee or Tea Type 1 and type 2 diabetics should only drink water.   ____Ensure clear carbohydrate drink on the way to the hospital for bariatric patients  ____Ensure clear carbohydrate drink 3 hours before surgery for Dr Dwyane Luo patients if physician instructed.   No gum chewing or hard candies.     __x__ 2. No Alcohol for 24 hours before or after surgery.   __x__3. No Smoking or e-cigarettes for 24 prior to surgery.  Do not use any chewable tobacco products for at least 6 hour prior to surgery   ____  4. Bring all medications with you on the day of surgery if instructed.    __x__ 5. Notify your doctor if there is any change in your medical condition     (cold, fever, infections).    x___6. On the morning of surgery brush your teeth with toothpaste and water.  You may rinse your mouth with mouth wash if you wish.  Do not swallow any toothpaste or mouthwash.   Do not wear jewelry, make-up, hairpins, clips  or nail polish.  Do not wear lotions, powders, or perfumes. You may wear deodorant.  Do not shave 48 hours prior to surgery. Men may shave face and neck.  Do not bring valuables to the hospital.    [facility] is not responsible for any belongings or valuables.               Contacts, dentures or bridgework may not be worn into surgery.  Leave your suitcase in the car. After surgery it may be brought to your room.  For patients admitted to the hospital, discharge time is determined by your                       treatment team.  _  Patients discharged the day of surgery will not be allowed to drive home.  You will need someone to drive you home and stay with you the night of your procedure.    Please read over the following fact sheets that you were given:   [facility] Preparing for Surgery and or MRSA Information   _x___ Take anti-hypertensive listed below, cardiac, seizure, asthma,     anti-reflux and psychiatric medicines. These include:  1. gabapentin (NEURONTIN) 600 MG tablet  2.simvastatin (ZOCOR) 40   3.  4.  5.  6.  ____Fleets  enema or Magnesium Citrate as directed.   _x___ Use CHG Soap or sage wipes as directed on instruction sheet   ____ Use inhalers on the day of surgery and bring to hospital day of surgery  __x__ Stop Metformin and Janumet 2 days prior to surgery.    ____ Take 1/2 of usual insulin dose the night before surgery and none on the morning     surgery.   _x___ Follow recommendations from Cardiologist, Pulmonologist or PCP regarding          stopping Aspirin, Coumadin, Plavix ,Eliquis, Effient, or Pradaxa, and Pletal.  X____Stop Anti-inflammatories such as Advil, Aleve, Ibuprofen, Motrin, Naproxen, Naprosyn, Goodies powders or aspirin products. OK to take Tylenol and                          Celebrex.   _x___ Stop supplements until after surgery.  But may continue Vitamin D, Vitamin B,       and multivitamin. Stop fish oils 1 week before surgery.   ____  Bring C-Pap to the hospital.

## 2019-03-30 NOTE — Pre-Procedure Instructions (Signed)
Incentive spirometry and carb drink given along with instructions.

## 2019-03-31 LAB — URINE CULTURE
Culture: <10000 — AB
Special Requests: NORMAL

## 2019-04-04 DIAGNOSIS — M1711 Unilateral primary osteoarthritis, right knee: Secondary | ICD-10-CM | POA: Diagnosis not present

## 2019-04-07 ENCOUNTER — Other Ambulatory Visit
Admission: RE | Admit: 2019-04-07 | Discharge: 2019-04-07 | Disposition: A | Discharge status: Home / Self Care | Payer: PPO | Source: Ambulatory Visit | Attending: Orthopedic Surgery | Admitting: Orthopedic Surgery

## 2019-04-07 ENCOUNTER — Other Ambulatory Visit: Payer: Self-pay

## 2019-04-07 DIAGNOSIS — Z01812 Encounter for preprocedural laboratory examination: Secondary | ICD-10-CM | POA: Diagnosis not present

## 2019-04-07 DIAGNOSIS — Z1159 Encounter for screening for other viral diseases: Secondary | ICD-10-CM | POA: Insufficient documentation

## 2019-04-08 LAB — SARS CORONAVIRUS 2 (TAT 6-24 HRS): SARS Coronavirus 2: NEGATIVE

## 2019-04-11 ENCOUNTER — Inpatient Hospital Stay: Payer: PPO | Admitting: Anesthesiology

## 2019-04-11 ENCOUNTER — Inpatient Hospital Stay
Admission: RE | Admit: 2019-04-11 | Discharge: 2019-04-13 | DRG: 470 | Disposition: A | Discharge status: Home Health | Payer: PPO | Attending: Orthopedic Surgery | Admitting: Orthopedic Surgery

## 2019-04-11 ENCOUNTER — Other Ambulatory Visit: Payer: Self-pay

## 2019-04-11 ENCOUNTER — Encounter
Admission: RE | Disposition: A | Discharge status: Home Health | Payer: Self-pay | Source: Home / Self Care | Attending: Orthopedic Surgery

## 2019-04-11 ENCOUNTER — Inpatient Hospital Stay: Payer: PPO

## 2019-04-11 ENCOUNTER — Encounter: Payer: Self-pay | Admitting: Orthopedic Surgery

## 2019-04-11 DIAGNOSIS — E7800 Pure hypercholesterolemia, unspecified: Secondary | ICD-10-CM | POA: Diagnosis not present

## 2019-04-11 DIAGNOSIS — Z79899 Other long term (current) drug therapy: Secondary | ICD-10-CM | POA: Diagnosis not present

## 2019-04-11 DIAGNOSIS — Z87442 Personal history of urinary calculi: Secondary | ICD-10-CM

## 2019-04-11 DIAGNOSIS — M48061 Spinal stenosis, lumbar region without neurogenic claudication: Secondary | ICD-10-CM | POA: Diagnosis present

## 2019-04-11 DIAGNOSIS — Z794 Long term (current) use of insulin: Secondary | ICD-10-CM | POA: Diagnosis not present

## 2019-04-11 DIAGNOSIS — Z85828 Personal history of other malignant neoplasm of skin: Secondary | ICD-10-CM | POA: Diagnosis not present

## 2019-04-11 DIAGNOSIS — M1711 Unilateral primary osteoarthritis, right knee: Secondary | ICD-10-CM | POA: Diagnosis not present

## 2019-04-11 DIAGNOSIS — I129 Hypertensive chronic kidney disease with stage 1 through stage 4 chronic kidney disease, or unspecified chronic kidney disease: Secondary | ICD-10-CM | POA: Diagnosis present

## 2019-04-11 DIAGNOSIS — N183 Chronic kidney disease, stage 3 (moderate): Secondary | ICD-10-CM | POA: Diagnosis not present

## 2019-04-11 DIAGNOSIS — Z96651 Presence of right artificial knee joint: Secondary | ICD-10-CM | POA: Diagnosis not present

## 2019-04-11 DIAGNOSIS — Z471 Aftercare following joint replacement surgery: Secondary | ICD-10-CM | POA: Diagnosis not present

## 2019-04-11 DIAGNOSIS — E1122 Type 2 diabetes mellitus with diabetic chronic kidney disease: Secondary | ICD-10-CM | POA: Diagnosis not present

## 2019-04-11 DIAGNOSIS — E1140 Type 2 diabetes mellitus with diabetic neuropathy, unspecified: Secondary | ICD-10-CM | POA: Diagnosis present

## 2019-04-11 DIAGNOSIS — Z7982 Long term (current) use of aspirin: Secondary | ICD-10-CM

## 2019-04-11 DIAGNOSIS — Z96659 Presence of unspecified artificial knee joint: Secondary | ICD-10-CM

## 2019-04-11 HISTORY — PX: KNEE ARTHROPLASTY: SHX992

## 2019-04-11 LAB — GLUCOSE, CAPILLARY
Glucose-Capillary: 119 mg/dL — ABNORMAL HIGH (ref 70–99)
Glucose-Capillary: 135 mg/dL — ABNORMAL HIGH (ref 70–99)
Glucose-Capillary: 140 mg/dL — ABNORMAL HIGH (ref 70–99)
Glucose-Capillary: 241 mg/dL — ABNORMAL HIGH (ref 70–99)

## 2019-04-11 LAB — ABO/RH: ABO/RH(D): O POS

## 2019-04-11 SURGERY — ARTHROPLASTY, KNEE, TOTAL, USING IMAGELESS COMPUTER-ASSISTED NAVIGATION
Anesthesia: Spinal | Laterality: Right

## 2019-04-11 MED ORDER — ACETAMINOPHEN 10 MG/ML IV SOLN
INTRAVENOUS | Status: DC | PRN
  Administered 2019-04-11: 1000 mg via INTRAVENOUS

## 2019-04-11 MED ORDER — CEFAZOLIN SODIUM-DEXTROSE 2-4 GM/100ML-% IV SOLN
2.0000 g | INTRAVENOUS | Status: AC
  Administered 2019-04-11: 2 g via INTRAVENOUS

## 2019-04-11 MED ORDER — PROPOFOL 500 MG/50ML IV EMUL
INTRAVENOUS | Status: DC | PRN
  Administered 2019-04-11: 60 ug/kg/min via INTRAVENOUS

## 2019-04-11 MED ORDER — TRAMADOL HCL 50 MG PO TABS
50.0000 mg | ORAL_TABLET | ORAL | Status: DC | PRN
  Administered 2019-04-11 – 2019-04-13 (×3): 50 mg via ORAL
  Filled 2019-04-11 (×3): qty 1

## 2019-04-11 MED ORDER — PROPOFOL 10 MG/ML IV BOLUS
INTRAVENOUS | Status: AC
  Filled 2019-04-11: qty 20

## 2019-04-11 MED ORDER — METOCLOPRAMIDE HCL 10 MG PO TABS: 5.0000 mg | ORAL_TABLET | Freq: Three times a day (TID) | ORAL | Status: DC | PRN

## 2019-04-11 MED ORDER — TRANEXAMIC ACID-NACL 1000-0.7 MG/100ML-% IV SOLN
1000.0000 mg | Freq: Once | INTRAVENOUS | Status: AC
  Administered 2019-04-11: 1000 mg via INTRAVENOUS
  Filled 2019-04-11: qty 100

## 2019-04-11 MED ORDER — FENTANYL CITRATE (PF) 100 MCG/2ML IJ SOLN: 25.0000 ug | INTRAMUSCULAR | Status: DC | PRN

## 2019-04-11 MED ORDER — BUPIVACAINE HCL (PF) 0.5 % IJ SOLN
INTRAMUSCULAR | Status: DC | PRN
  Administered 2019-04-11: 3 mL

## 2019-04-11 MED ORDER — OMEGA-3-ACID ETHYL ESTERS 1 G PO CAPS
1.0000 g | ORAL_CAPSULE | Freq: Two times a day (BID) | ORAL | Status: DC
  Administered 2019-04-11 – 2019-04-13 (×3): 1 g via ORAL
  Filled 2019-04-11 (×4): qty 1

## 2019-04-11 MED ORDER — ONDANSETRON HCL 4 MG/2ML IJ SOLN: 4.0000 mg | Freq: Four times a day (QID) | INTRAMUSCULAR | Status: DC | PRN

## 2019-04-11 MED ORDER — VITAMIN B-12 1000 MCG PO TABS
5000.0000 ug | ORAL_TABLET | Freq: Every day | ORAL | Status: DC
  Administered 2019-04-12 – 2019-04-13 (×2): 5000 ug via ORAL
  Filled 2019-04-11 (×2): qty 5

## 2019-04-11 MED ORDER — TRANEXAMIC ACID-NACL 1000-0.7 MG/100ML-% IV SOLN
1000.0000 mg | INTRAVENOUS | Status: AC
  Administered 2019-04-11: 1000 mg via INTRAVENOUS
  Filled 2019-04-11: qty 100

## 2019-04-11 MED ORDER — OMEGA-3 FATTY ACIDS 1000 MG PO CAPS: 1.0000 g | ORAL_CAPSULE | Freq: Two times a day (BID) | ORAL | Status: DC

## 2019-04-11 MED ORDER — ACETAMINOPHEN 10 MG/ML IV SOLN
INTRAVENOUS | Status: AC
  Filled 2019-04-11: qty 100

## 2019-04-11 MED ORDER — INSULIN GLARGINE 100 UNIT/ML ~~LOC~~ SOLN
60.0000 [IU] | Freq: Every day | SUBCUTANEOUS | Status: DC
  Administered 2019-04-12: 21:00:00 60 [IU] via SUBCUTANEOUS
  Filled 2019-04-11 (×2): qty 0.6

## 2019-04-11 MED ORDER — HYDROMORPHONE HCL 1 MG/ML IJ SOLN: 0.5000 mg | INTRAMUSCULAR | Status: DC | PRN

## 2019-04-11 MED ORDER — ACETAMINOPHEN 325 MG PO TABS: 325.0000 mg | ORAL_TABLET | Freq: Four times a day (QID) | ORAL | Status: DC | PRN

## 2019-04-11 MED ORDER — FLEET ENEMA 7-19 GM/118ML RE ENEM: 1.0000 | ENEMA | Freq: Once | RECTAL | Status: DC | PRN

## 2019-04-11 MED ORDER — CHLORHEXIDINE GLUCONATE 4 % EX LIQD: 60.0000 mL | Freq: Once | CUTANEOUS | Status: DC

## 2019-04-11 MED ORDER — SIMVASTATIN 40 MG PO TABS
40.0000 mg | ORAL_TABLET | Freq: Every day | ORAL | Status: DC
  Administered 2019-04-12 – 2019-04-13 (×2): 40 mg via ORAL
  Filled 2019-04-11 (×2): qty 2
  Filled 2019-04-11 (×2): qty 1

## 2019-04-11 MED ORDER — PROPOFOL 500 MG/50ML IV EMUL
INTRAVENOUS | Status: AC
  Filled 2019-04-11: qty 50

## 2019-04-11 MED ORDER — BUPIVACAINE HCL (PF) 0.25 % IJ SOLN
INTRAMUSCULAR | Status: AC
  Filled 2019-04-11: qty 60

## 2019-04-11 MED ORDER — SODIUM CHLORIDE 0.9 % IV SOLN
INTRAVENOUS | Status: DC | PRN
  Administered 2019-04-11: 60 mL

## 2019-04-11 MED ORDER — MIDAZOLAM HCL 5 MG/5ML IJ SOLN
INTRAMUSCULAR | Status: DC | PRN
  Administered 2019-04-11 (×2): 1 mg via INTRAVENOUS

## 2019-04-11 MED ORDER — CELECOXIB 200 MG PO CAPS
200.0000 mg | ORAL_CAPSULE | Freq: Two times a day (BID) | ORAL | Status: DC
  Administered 2019-04-11 – 2019-04-13 (×4): 200 mg via ORAL
  Filled 2019-04-11 (×4): qty 1

## 2019-04-11 MED ORDER — ONDANSETRON HCL 4 MG PO TABS: 4.0000 mg | ORAL_TABLET | Freq: Four times a day (QID) | ORAL | Status: DC | PRN

## 2019-04-11 MED ORDER — PANTOPRAZOLE SODIUM 40 MG PO TBEC
40.0000 mg | DELAYED_RELEASE_TABLET | Freq: Two times a day (BID) | ORAL | Status: DC
  Administered 2019-04-11 – 2019-04-13 (×4): 40 mg via ORAL
  Filled 2019-04-11 (×4): qty 1

## 2019-04-11 MED ORDER — LIDOCAINE HCL (CARDIAC) PF 100 MG/5ML IV SOSY
PREFILLED_SYRINGE | INTRAVENOUS | Status: DC | PRN
  Administered 2019-04-11: 100 mg via INTRAVENOUS

## 2019-04-11 MED ORDER — FUROSEMIDE 20 MG PO TABS
20.0000 mg | ORAL_TABLET | Freq: Every day | ORAL | Status: DC
  Administered 2019-04-12 – 2019-04-13 (×2): 20 mg via ORAL
  Filled 2019-04-11 (×2): qty 1

## 2019-04-11 MED ORDER — METOCLOPRAMIDE HCL 5 MG/ML IJ SOLN: 5.0000 mg | Freq: Three times a day (TID) | INTRAMUSCULAR | Status: DC | PRN

## 2019-04-11 MED ORDER — MIDAZOLAM HCL 2 MG/2ML IJ SOLN
INTRAMUSCULAR | Status: AC
  Filled 2019-04-11: qty 2

## 2019-04-11 MED ORDER — NIACIN ER (ANTIHYPERLIPIDEMIC) 500 MG PO TBCR
500.0000 mg | EXTENDED_RELEASE_TABLET | Freq: Every day | ORAL | Status: DC
  Administered 2019-04-11 – 2019-04-12 (×2): 500 mg via ORAL
  Filled 2019-04-11 (×3): qty 1

## 2019-04-11 MED ORDER — ALUM & MAG HYDROXIDE-SIMETH 200-200-20 MG/5ML PO SUSP: 30.0000 mL | ORAL | Status: DC | PRN

## 2019-04-11 MED ORDER — OXYCODONE HCL 5 MG PO TABS
5.0000 mg | ORAL_TABLET | ORAL | Status: DC | PRN
  Administered 2019-04-11: 5 mg via ORAL
  Filled 2019-04-11: qty 1

## 2019-04-11 MED ORDER — METOCLOPRAMIDE HCL 10 MG PO TABS
10.0000 mg | ORAL_TABLET | Freq: Three times a day (TID) | ORAL | Status: AC
  Administered 2019-04-11 – 2019-04-13 (×8): 10 mg via ORAL
  Filled 2019-04-11 (×8): qty 1

## 2019-04-11 MED ORDER — CELECOXIB 200 MG PO CAPS
ORAL_CAPSULE | ORAL | Status: AC
  Administered 2019-04-11: 400 mg via ORAL
  Filled 2019-04-11: qty 2

## 2019-04-11 MED ORDER — OXYCODONE HCL 5 MG/5ML PO SOLN: 5.0000 mg | Freq: Once | ORAL | Status: DC | PRN

## 2019-04-11 MED ORDER — BUPIVACAINE LIPOSOME 1.3 % IJ SUSP
INTRAMUSCULAR | Status: AC
  Filled 2019-04-11: qty 20

## 2019-04-11 MED ORDER — BISACODYL 10 MG RE SUPP
10.0000 mg | Freq: Every day | RECTAL | Status: DC | PRN
  Administered 2019-04-12: 10 mg via RECTAL
  Filled 2019-04-11: qty 1

## 2019-04-11 MED ORDER — MAGNESIUM HYDROXIDE 400 MG/5ML PO SUSP
30.0000 mL | Freq: Every day | ORAL | Status: DC
  Administered 2019-04-12: 30 mL via ORAL
  Filled 2019-04-11: qty 30

## 2019-04-11 MED ORDER — SODIUM CHLORIDE FLUSH 0.9 % IV SOLN
INTRAVENOUS | Status: AC
  Filled 2019-04-11: qty 40

## 2019-04-11 MED ORDER — OXYCODONE HCL 5 MG PO TABS
10.0000 mg | ORAL_TABLET | ORAL | Status: DC | PRN
  Administered 2019-04-11: 10 mg via ORAL
  Filled 2019-04-11: qty 2

## 2019-04-11 MED ORDER — GABAPENTIN 300 MG PO CAPS
ORAL_CAPSULE | ORAL | Status: AC
  Administered 2019-04-11: 300 mg via ORAL
  Filled 2019-04-11: qty 1

## 2019-04-11 MED ORDER — MENTHOL 3 MG MT LOZG
1.0000 | LOZENGE | OROMUCOSAL | Status: DC | PRN
  Filled 2019-04-11: qty 9

## 2019-04-11 MED ORDER — INSULIN ASPART 100 UNIT/ML ~~LOC~~ SOLN
0.0000 [IU] | Freq: Three times a day (TID) | SUBCUTANEOUS | Status: DC
  Administered 2019-04-11: 2 [IU] via SUBCUTANEOUS
  Administered 2019-04-11: 5 [IU] via SUBCUTANEOUS
  Administered 2019-04-12 (×2): 2 [IU] via SUBCUTANEOUS
  Administered 2019-04-12: 08:00:00 3 [IU] via SUBCUTANEOUS
  Filled 2019-04-11 (×5): qty 1

## 2019-04-11 MED ORDER — SENNOSIDES-DOCUSATE SODIUM 8.6-50 MG PO TABS
1.0000 | ORAL_TABLET | Freq: Two times a day (BID) | ORAL | Status: DC
  Administered 2019-04-12 – 2019-04-13 (×3): 1 via ORAL
  Filled 2019-04-11 (×4): qty 1

## 2019-04-11 MED ORDER — FERROUS SULFATE 325 (65 FE) MG PO TABS
325.0000 mg | ORAL_TABLET | Freq: Two times a day (BID) | ORAL | Status: DC
  Administered 2019-04-11 – 2019-04-13 (×4): 325 mg via ORAL
  Filled 2019-04-11 (×4): qty 1

## 2019-04-11 MED ORDER — SODIUM CHLORIDE 0.9 % IV SOLN
INTRAVENOUS | Status: DC
  Administered 2019-04-11 (×2): via INTRAVENOUS

## 2019-04-11 MED ORDER — CEFAZOLIN SODIUM-DEXTROSE 2-4 GM/100ML-% IV SOLN
INTRAVENOUS | Status: AC
  Filled 2019-04-11: qty 100

## 2019-04-11 MED ORDER — VITAMIN D 25 MCG (1000 UNIT) PO TABS
5000.0000 [IU] | ORAL_TABLET | Freq: Every day | ORAL | Status: DC
  Administered 2019-04-12 – 2019-04-13 (×2): 5000 [IU] via ORAL
  Filled 2019-04-11 (×2): qty 5

## 2019-04-11 MED ORDER — FAMOTIDINE 20 MG PO TABS
20.0000 mg | ORAL_TABLET | Freq: Once | ORAL | Status: AC
  Administered 2019-04-11: 07:00:00 20 mg via ORAL

## 2019-04-11 MED ORDER — SODIUM CHLORIDE 0.9 % IV SOLN
INTRAVENOUS | Status: DC
  Administered 2019-04-11 (×2): via INTRAVENOUS

## 2019-04-11 MED ORDER — CEFAZOLIN SODIUM-DEXTROSE 2-4 GM/100ML-% IV SOLN
2.0000 g | Freq: Four times a day (QID) | INTRAVENOUS | Status: AC
  Administered 2019-04-11 – 2019-04-12 (×4): 2 g via INTRAVENOUS
  Filled 2019-04-11 (×4): qty 100

## 2019-04-11 MED ORDER — PROPOFOL 10 MG/ML IV BOLUS
INTRAVENOUS | Status: DC | PRN
  Administered 2019-04-11: 40 mg via INTRAVENOUS

## 2019-04-11 MED ORDER — PHENOL 1.4 % MT LIQD
1.0000 | OROMUCOSAL | Status: DC | PRN
  Filled 2019-04-11: qty 177

## 2019-04-11 MED ORDER — BUPIVACAINE HCL (PF) 0.25 % IJ SOLN
INTRAMUSCULAR | Status: DC | PRN
  Administered 2019-04-11: 60 mL

## 2019-04-11 MED ORDER — ENOXAPARIN SODIUM 30 MG/0.3ML ~~LOC~~ SOLN
30.0000 mg | Freq: Two times a day (BID) | SUBCUTANEOUS | Status: DC
  Administered 2019-04-12 – 2019-04-13 (×3): 30 mg via SUBCUTANEOUS
  Filled 2019-04-11 (×3): qty 0.3

## 2019-04-11 MED ORDER — VANCOMYCIN HCL IN DEXTROSE 1-5 GM/200ML-% IV SOLN
1000.0000 mg | Freq: Once | INTRAVENOUS | Status: AC
  Administered 2019-04-11: 1000 mg via INTRAVENOUS

## 2019-04-11 MED ORDER — GABAPENTIN 300 MG PO CAPS
300.0000 mg | ORAL_CAPSULE | Freq: Once | ORAL | Status: AC
  Administered 2019-04-11: 07:00:00 300 mg via ORAL

## 2019-04-11 MED ORDER — METFORMIN HCL 850 MG PO TABS
850.0000 mg | ORAL_TABLET | Freq: Two times a day (BID) | ORAL | Status: DC
  Administered 2019-04-11 – 2019-04-13 (×4): 850 mg via ORAL
  Filled 2019-04-11 (×5): qty 1

## 2019-04-11 MED ORDER — DEXAMETHASONE SODIUM PHOSPHATE 10 MG/ML IJ SOLN
INTRAMUSCULAR | Status: AC
  Filled 2019-04-11: qty 1

## 2019-04-11 MED ORDER — NEOMYCIN-POLYMYXIN B GU 40-200000 IR SOLN
Status: DC | PRN
  Administered 2019-04-11: 14 mL

## 2019-04-11 MED ORDER — DIPHENHYDRAMINE HCL 12.5 MG/5ML PO ELIX: 12.5000 mg | ORAL_SOLUTION | ORAL | Status: DC | PRN

## 2019-04-11 MED ORDER — ACETAMINOPHEN 10 MG/ML IV SOLN
1000.0000 mg | Freq: Four times a day (QID) | INTRAVENOUS | Status: AC
  Administered 2019-04-11 (×3): 1000 mg via INTRAVENOUS
  Filled 2019-04-11 (×4): qty 100

## 2019-04-11 MED ORDER — FAMOTIDINE 20 MG PO TABS
ORAL_TABLET | ORAL | Status: AC
  Administered 2019-04-11: 20 mg via ORAL
  Filled 2019-04-11: qty 1

## 2019-04-11 MED ORDER — NEOMYCIN-POLYMYXIN B GU 40-200000 IR SOLN
Status: AC
  Filled 2019-04-11: qty 20

## 2019-04-11 MED ORDER — OXYCODONE HCL 5 MG PO TABS: 5.0000 mg | ORAL_TABLET | Freq: Once | ORAL | Status: DC | PRN

## 2019-04-11 MED ORDER — GABAPENTIN 600 MG PO TABS
600.0000 mg | ORAL_TABLET | Freq: Three times a day (TID) | ORAL | Status: DC
  Administered 2019-04-11 – 2019-04-13 (×6): 600 mg via ORAL
  Filled 2019-04-11 (×8): qty 1

## 2019-04-11 MED ORDER — ENSURE ENLIVE PO LIQD: 296.0000 mL | Freq: Once | ORAL | Status: DC

## 2019-04-11 MED ORDER — DEXAMETHASONE SODIUM PHOSPHATE 10 MG/ML IJ SOLN: 8.0000 mg | Freq: Once | INTRAMUSCULAR | Status: DC

## 2019-04-11 MED ORDER — CELECOXIB 200 MG PO CAPS
400.0000 mg | ORAL_CAPSULE | Freq: Once | ORAL | Status: AC
  Administered 2019-04-11: 07:00:00 400 mg via ORAL

## 2019-04-11 MED ORDER — VANCOMYCIN HCL IN DEXTROSE 1-5 GM/200ML-% IV SOLN
INTRAVENOUS | Status: AC
  Filled 2019-04-11: qty 200

## 2019-04-11 MED ORDER — LISINOPRIL 5 MG PO TABS
5.0000 mg | ORAL_TABLET | Freq: Every day | ORAL | Status: DC
  Administered 2019-04-11 – 2019-04-13 (×3): 5 mg via ORAL
  Filled 2019-04-11 (×3): qty 1

## 2019-04-11 MED ORDER — SODIUM CHLORIDE 0.9 % IV SOLN
INTRAVENOUS | Status: DC | PRN
  Administered 2019-04-11: 09:00:00 15 ug/min via INTRAVENOUS

## 2019-04-11 SURGICAL SUPPLY — 66 items
ATTUNE MED DOME PAT 38 KNEE (Knees) ×2 IMPLANT
ATTUNE PS FEM RT SZ 6 CEM KNEE (Femur) ×2 IMPLANT
ATTUNE PSRP INSR SZ6 5 KNEE (Insert) ×2 IMPLANT
BASE TIBIAL ROT PLAT SZ 7 KNEE (Knees) ×1 IMPLANT
BATTERY INSTRU NAVIGATION (MISCELLANEOUS) ×8 IMPLANT
BLADE SAW 70X12.5 (BLADE) ×2 IMPLANT
BLADE SAW 90X13X1.19 OSCILLAT (BLADE) ×2 IMPLANT
BLADE SAW 90X25X1.19 OSCILLAT (BLADE) ×2 IMPLANT
BONE CEMENT GENTAMICIN (Cement) ×4 IMPLANT
CANISTER SUCT 3000ML PPV (MISCELLANEOUS) ×2 IMPLANT
CEMENT BONE GENTAMICIN 40 (Cement) ×2 IMPLANT
COOLER POLAR GLACIER W/PUMP (MISCELLANEOUS) ×2 IMPLANT
COVER WAND RF STERILE (DRAPES) ×2 IMPLANT
CUFF TOURN SGL QUICK 24 (TOURNIQUET CUFF)
CUFF TOURN SGL QUICK 30 (TOURNIQUET CUFF) ×1
CUFF TRNQT CYL 24X4X16.5-23 (TOURNIQUET CUFF) IMPLANT
CUFF TRNQT CYL 30X4X21-28X (TOURNIQUET CUFF) ×1 IMPLANT
DRAPE SHEET LG 3/4 BI-LAMINATE (DRAPES) ×2 IMPLANT
DRSG DERMACEA 8X12 NADH (GAUZE/BANDAGES/DRESSINGS) ×2 IMPLANT
DRSG OPSITE POSTOP 4X14 (GAUZE/BANDAGES/DRESSINGS) ×2 IMPLANT
DRSG TEGADERM 4X4.75 (GAUZE/BANDAGES/DRESSINGS) ×2 IMPLANT
DURAPREP 26ML APPLICATOR (WOUND CARE) ×4 IMPLANT
ELECT REM PT RETURN 9FT ADLT (ELECTROSURGICAL) ×2
ELECTRODE REM PT RTRN 9FT ADLT (ELECTROSURGICAL) ×1 IMPLANT
EX-PIN ORTHOLOCK NAV 4X150 (PIN) ×4 IMPLANT
GLOVE BIOGEL M STRL SZ7.5 (GLOVE) ×6 IMPLANT
GLOVE INDICATOR 8.0 STRL GRN (GLOVE) ×6 IMPLANT
GOWN STRL REUS W/ TWL LRG LVL3 (GOWN DISPOSABLE) ×3 IMPLANT
GOWN STRL REUS W/TWL LRG LVL3 (GOWN DISPOSABLE) ×3
HEMOVAC 400CC 10FR (MISCELLANEOUS) ×2 IMPLANT
HOLDER FOLEY CATH W/STRAP (MISCELLANEOUS) ×2 IMPLANT
HOOD PEEL AWAY FLYTE STAYCOOL (MISCELLANEOUS) ×4 IMPLANT
KIT TURNOVER KIT A (KITS) ×2 IMPLANT
KNIFE SCULPS 14X20 (INSTRUMENTS) ×2 IMPLANT
LABEL OR SOLS (LABEL) ×2 IMPLANT
MANIFOLD NEPTUNE WASTE (CANNULA) ×2 IMPLANT
NDL SAFETY ECLIPSE 18X1.5 (NEEDLE) ×1 IMPLANT
NEEDLE HYPO 18GX1.5 SHARP (NEEDLE) ×1
NEEDLE SPNL 20GX3.5 QUINCKE YW (NEEDLE) ×4 IMPLANT
NS IRRIG 500ML POUR BTL (IV SOLUTION) ×2 IMPLANT
PACK TOTAL KNEE (MISCELLANEOUS) ×2 IMPLANT
PAD WRAPON POLAR KNEE (MISCELLANEOUS) ×1 IMPLANT
PENCIL SMOKE ULTRAEVAC 22 CON (MISCELLANEOUS) ×2 IMPLANT
PIN FIXATION 1/8DIA X 3INL (PIN) ×6 IMPLANT
PULSAVAC PLUS IRRIG FAN TIP (DISPOSABLE) ×2
SOL .9 NS 3000ML IRR  AL (IV SOLUTION) ×1
SOL .9 NS 3000ML IRR UROMATIC (IV SOLUTION) ×1 IMPLANT
SOL PREP PVP 2OZ (MISCELLANEOUS) ×2
SOLUTION PREP PVP 2OZ (MISCELLANEOUS) ×1 IMPLANT
SPONGE DRAIN TRACH 4X4 STRL 2S (GAUZE/BANDAGES/DRESSINGS) ×2 IMPLANT
STAPLER SKIN PROX 35W (STAPLE) ×2 IMPLANT
STOCKINETTE IMPERV 14X48 (MISCELLANEOUS) IMPLANT
STRAP TIBIA SHORT (MISCELLANEOUS) ×2 IMPLANT
SUCTION FRAZIER HANDLE 10FR (MISCELLANEOUS) ×1
SUCTION TUBE FRAZIER 10FR DISP (MISCELLANEOUS) ×1 IMPLANT
SUT VIC AB 0 CT1 36 (SUTURE) ×4 IMPLANT
SUT VIC AB 1 CT1 36 (SUTURE) ×4 IMPLANT
SUT VIC AB 2-0 CT2 27 (SUTURE) ×2 IMPLANT
SYR 20CC LL (SYRINGE) ×2 IMPLANT
SYR 30ML LL (SYRINGE) ×4 IMPLANT
TIBIAL BASE ROT PLAT SZ 7 KNEE (Knees) ×2 IMPLANT
TIP FAN IRRIG PULSAVAC PLUS (DISPOSABLE) ×1 IMPLANT
TOWEL OR 17X26 4PK STRL BLUE (TOWEL DISPOSABLE) ×2 IMPLANT
TOWER CARTRIDGE SMART MIX (DISPOSABLE) ×2 IMPLANT
TRAY FOLEY MTR SLVR 16FR STAT (SET/KITS/TRAYS/PACK) ×2 IMPLANT
WRAPON POLAR PAD KNEE (MISCELLANEOUS) ×2

## 2019-04-11 NOTE — H&P (Signed)
The patient has been re-examined, and the chart reviewed, and there have been no interval changes to the documented history and physical.    The risks, benefits, and alternatives have been discussed at length. The patient expressed understanding of the risks benefits and agreed with plans for surgical intervention.  Tarun Patchell P. Brendolyn Stockley, Jr. M.D.    

## 2019-04-11 NOTE — Op Note (Signed)
OPERATIVE NOTE  DATE OF SURGERY:  04/11/2019  PATIENT NAME:  Christian [name]   DOB: Feb 09, 1935  MRN: [id number]  PRE-OPERATIVE DIAGNOSIS: Degenerative arthrosis of the right knee, primary  POST-OPERATIVE DIAGNOSIS:  Same  PROCEDURE:  Right total knee arthroplasty using computer-assisted navigation  SURGEON:  Marciano Sequin. M.D.  ASSISTANT: Maximino Greenland, RN (present and scrubbed throughout the case, critical for assistance with exposure, retraction, instrumentation, and closure)  ANESTHESIA: spinal  ESTIMATED BLOOD LOSS: 50 mL  FLUIDS REPLACED: 1400 mL of crystalloid  TOURNIQUET TIME: 96 minutes  DRAINS: 2 medium Hemovac drains  SOFT TISSUE RELEASES: Anterior cruciate ligament, posterior cruciate ligament, deep medial collateral ligament, patellofemoral ligament  IMPLANTS UTILIZED: DePuy Attune size 6 posterior stabilized femoral component (cemented), size 7 rotating platform tibial component (cemented), 38 mm medialized dome patella (cemented), and a 5 mm stabilized rotating platform polyethylene insert.  INDICATIONS FOR SURGERY: Christian [name] is a 84 y.o. year old male with a long history of progressive knee pain. X-rays demonstrated severe degenerative changes in tricompartmental fashion. The patient had not seen any significant improvement despite conservative nonsurgical intervention. After discussion of the risks and benefits of surgical intervention, the patient expressed understanding of the risks benefits and agree with plans for total knee arthroplasty.   The risks, benefits, and alternatives were discussed at length including but not limited to the risks of infection, bleeding, nerve injury, stiffness, blood clots, the need for revision surgery, cardiopulmonary complications, among others, and they were willing to proceed.  PROCEDURE IN DETAIL: The patient was brought into the operating room and, after adequate spinal anesthesia was achieved, a tourniquet was  placed on the patient's upper thigh. The patient's knee and leg were cleaned and prepped with alcohol and DuraPrep and draped in the usual sterile fashion. A "timeout" was performed as per usual protocol. The lower extremity was exsanguinated using an Esmarch, and the tourniquet was inflated to 300 mmHg. An anterior longitudinal incision was made followed by a standard mid vastus approach. The deep fibers of the medial collateral ligament were elevated in a subperiosteal fashion off of the medial flare of the tibia so as to maintain a continuous soft tissue sleeve. The patella was subluxed laterally and the patellofemoral ligament was incised. Inspection of the knee demonstrated severe degenerative changes with full-thickness loss of articular cartilage. Osteophytes were debrided using a rongeur. Anterior and posterior cruciate ligaments were excised. Two 4.0 mm Schanz pins were inserted in the femur and into the tibia for attachment of the array of trackers used for computer-assisted navigation. Hip center was identified using a circumduction technique. Distal landmarks were mapped using the computer. The distal femur and proximal tibia were mapped using the computer. The distal femoral cutting guide was positioned using computer-assisted navigation so as to achieve a 5 distal valgus cut. The femur was sized and it was felt that a size 6 femoral component was appropriate. A size 6 femoral cutting guide was positioned and the anterior cut was performed and verified using the computer. This was followed by completion of the posterior and chamfer cuts. Femoral cutting guide for the central box was then positioned in the center box cut was performed.  Attention was then directed to the proximal tibia. Medial and lateral menisci were excised. The extramedullary tibial cutting guide was positioned using computer-assisted navigation so as to achieve a 0 varus-valgus alignment and 3 posterior slope. The cut was  performed and verified using the computer. The proximal tibia was  sized and it was felt that a size 7 tibial tray was appropriate. Tibial and femoral trials were inserted followed by insertion of a 5 mm polyethylene insert. This allowed for excellent mediolateral soft tissue balancing both in flexion and in full extension. Finally, the patella was cut and prepared so as to accommodate a 38 mm medialized dome patella. A patella trial was placed and the knee was placed through a range of motion with excellent patellar tracking appreciated. The femoral trial was removed after debridement of posterior osteophytes. The central post-hole for the tibial component was reamed followed by insertion of a keel punch. Tibial trials were then removed. Cut surfaces of bone were irrigated with copious amounts of normal saline with antibiotic solution using pulsatile lavage and then suctioned dry. Polymethylmethacrylate cement with gentamicin was prepared in the usual fashion using a vacuum mixer. Cement was applied to the cut surface of the proximal tibia as well as along the undersurface of a size 7 rotating platform tibial component. Tibial component was positioned and impacted into place. Excess cement was removed using [employer] fast streamer. Cement was then applied to the cut surfaces of the femur as well as along the posterior flanges of the size 6 femoral component. The femoral component was positioned and impacted into place. Excess cement was removed using [employer] fast streamer. A 5 mm polyethylene trial was inserted and the knee was brought into full extension with steady axial compression applied. Finally, cement was applied to the backside of a 38 mm medialized dome patella and the patellar component was positioned and patellar clamp applied. Excess cement was removed using [employer] fast streamer. After adequate curing of the cement, the tourniquet was deflated after a total tourniquet time of 96 minutes. Hemostasis was achieved using  electrocautery. The knee was irrigated with copious amounts of normal saline with antibiotic solution using pulsatile lavage and then suctioned dry. 20 mL of 1.3% Exparel and 60 mL of 0.25% Marcaine in 40 mL of normal saline was injected along the posterior capsule, medial and lateral gutters, and along the arthrotomy site. A 5 mm stabilized rotating platform polyethylene insert was inserted and the knee was placed through a range of motion with excellent mediolateral soft tissue balancing appreciated and excellent patellar tracking noted. 2 medium drains were placed in the wound bed and brought out through separate stab incisions. The medial parapatellar portion of the incision was reapproximated using interrupted sutures of #1 Vicryl. Subcutaneous tissue was approximated in layers using first #0 Vicryl followed #2-0 Vicryl. The skin was approximated with skin staples. A sterile dressing was applied.  The patient tolerated the procedure well and was transported to the recovery room in stable condition.    Kortney Potvin P. Holley Bouche., M.D.

## 2019-04-11 NOTE — Anesthesia Procedure Notes (Signed)
Date/Time: 04/11/2019 8:01 AM Performed by: Nelda Marseille, CRNA Pre-anesthesia Checklist: Patient identified, Emergency Drugs available, Suction available, Patient being monitored and Timeout performed Oxygen Delivery Method: Simple face mask

## 2019-04-11 NOTE — Discharge Instructions (Signed)
°  Instructions after Total Knee Replacement ° ° Ammarie Matsuura P. Aviyah Swetz, Jr., M.D.    ° Dept. of Orthopaedics & Sports Medicine ° [facility] ° [address] ° [place], [place]  [address] ° Phone: [phone]   Fax: [phone] ° °  °DIET: °• Drink plenty of non-alcoholic fluids. °• Resume your normal diet. Include foods high in fiber. ° °ACTIVITY:  °• You may use crutches or a walker with weight-bearing as tolerated, unless instructed otherwise. °• You may be weaned off of the walker or crutches by your Physical Therapist.  °• Do NOT place pillows under the knee. Anything placed under the knee could limit your ability to straighten the knee.   °• Continue doing gentle exercises. Exercising will reduce the pain and swelling, increase motion, and prevent muscle weakness.   °• Please continue to use the TED compression stockings for 6 weeks. You may remove the stockings at night, but should reapply them in the morning. °• Do not drive or operate any equipment until instructed. ° °WOUND CARE:  °• Continue to use the PolarCare or ice packs periodically to reduce pain and swelling. °• You may bathe or shower after the staples are removed at the first office visit following surgery. ° °MEDICATIONS: °• You may resume your regular medications. °• Please take the pain medication as prescribed on the medication. °• Do not take pain medication on an empty stomach. °• You have been given a prescription for a blood thinner (Lovenox or Coumadin). Please take the medication as instructed. (NOTE: After completing a 2 week course of Lovenox, take one Enteric-coated aspirin once a day. This along with elevation will help reduce the possibility of phlebitis in your operated leg.) °• Do not drive or drink alcoholic beverages when taking pain medications. ° °CALL THE OFFICE FOR: °• Temperature above 101 degrees °• Excessive bleeding or drainage on the dressing. °• Excessive swelling, coldness, or paleness of the toes. °• Persistent  nausea and vomiting. ° °FOLLOW-UP:  °• You should have an appointment to return to the office in 10-14 days after surgery. °• Arrangements have been made for continuation of Physical Therapy (either home therapy or outpatient therapy). °  °

## 2019-04-11 NOTE — Transfer of Care (Signed)
Immediate Anesthesia Transfer of Care Note  Patient: Christian [name]  Procedure(s) Performed: COMPUTER ASSISTED TOTAL KNEE ARTHROPLASTY (Right )  Patient Location: PACU  Anesthesia Type:Spinal  Level of Consciousness: awake and sedated  Airway & Oxygen Therapy: Patient Spontanous Breathing and Patient connected to face mask oxygen  Post-op Assessment: Report given to RN and Post -op Vital signs reviewed and stable  Post vital signs: Reviewed and stable  Last Vitals:  Vitals Value Taken Time  BP 129/63 04/11/19 1104  Temp    Pulse 70 04/11/19 1105  Resp 14 04/11/19 1105  SpO2 100 % 04/11/19 1105  Vitals shown include unvalidated device data.  Last Pain:  Vitals:   04/11/19 0647  TempSrc: Oral  PainSc: 4       Patients Stated Pain Goal: 1 (91/69/45 0388)  Complications: No apparent anesthesia complications

## 2019-04-11 NOTE — Anesthesia Preprocedure Evaluation (Addendum)
Anesthesia Evaluation  Patient identified by MRN, date of birth, ID band Patient awake    Reviewed: Allergy & Precautions, H&P , NPO status , Patient's Chart, lab work & pertinent test results  History of Anesthesia Complications Negative for: history of anesthetic complications  Airway Mallampati: III  TM Distance: <3 FB Neck ROM: limited    Dental  (+) Chipped, Poor Dentition, Missing   Pulmonary neg shortness of breath, former smoker,           Cardiovascular Exercise Tolerance: Good hypertension, (-) angina(-) Past MI and (-) DOE      Neuro/Psych negative neurological ROS  negative psych ROS   GI/Hepatic negative GI ROS, Neg liver ROS,   Endo/Other  diabetes, Type 2  Renal/GU Renal disease     Musculoskeletal  (+) Arthritis ,   Abdominal   Peds  Hematology negative hematology ROS (+)   Anesthesia Other Findings Patient endorses chronic back pain  Past Medical History: No date: Arthritis No date: Back pain No date: Diabetes mellitus     Comment:  Type 2 No date: Diabetic neuropathy (HCC) No date: History of kidney stones 03/03/2013: HTN (hypertension) No date: Hypercholesterolemia No date: Leg cramps No date: Lower extremity edema 05/17/2014: Neurogenic claudication 04/20/2013: Skin cancer of arm     Comment:  left upper arm 05/17/2014: Spinal stenosis     Comment:  L3-L4  No date: Urgency of urination  Past Surgical History: No date: APPENDECTOMY     Comment:  age 79 11/22/2014: LUMBAR LAMINECTOMY/DECOMPRESSION MICRODISCECTOMY; Bilateral     Comment:  Procedure: Laminectomy and foraminotomy lumbar               three-lumbar four bilateral ;  Surgeon: Eustace Moore,               MD;  Location: [facility];  Service: Neurosurgery;                Laterality: Bilateral; No date: SKIN CANCER EXCISION; Left     Comment:  shoulder and hand  BMI    Body Mass Index: 33.45 kg/m       Reproductive/Obstetrics negative OB ROS                            Anesthesia Physical Anesthesia Plan  ASA: III  Anesthesia Plan: Spinal   Post-op Pain Management:    Induction:   PONV Risk Score and Plan:   Airway Management Planned: Natural Airway and Nasal Cannula  Additional Equipment:   Intra-op Plan:   Post-operative Plan:   Informed Consent: I have reviewed the patients History and Physical, chart, labs and discussed the procedure including the risks, benefits and alternatives for the proposed anesthesia with the patient or authorized representative who has indicated his/her understanding and acceptance.     Dental Advisory Given  Plan Discussed with: Anesthesiologist, CRNA and Surgeon  Anesthesia Plan Comments: (Patient reports no bleeding problems and no anticoagulant use.  Plan for spinal with backup GA  Patient consented for risks of anesthesia including but not limited to:  - adverse reactions to medications - risk of bleeding, infection, nerve damage and headache - risk of failed spinal - damage to teeth, lips or other oral mucosa - sore throat or hoarseness - Damage to heart, brain, lungs or loss of life  Patient voiced understanding.)        Anesthesia Quick Evaluation

## 2019-04-11 NOTE — Anesthesia Post-op Follow-up Note (Signed)
Anesthesia QCDR form completed.        

## 2019-04-11 NOTE — Evaluation (Signed)
Physical Therapy Evaluation Patient Details Name: Christian [name] MRN: [id number] DOB: [birth year] Today's Date: 04/11/2019   History of Present Illness  Pt admitted for R TKR.  Clinical Impression  Pt is a pleasant 84 year old male who was admitted for R TKR. Pt performs bed mobility, transfers, and ambulation with cga and RW. Pt demonstrates ability to perform 10 SLRs with independence, therefore does not require KI for mobility. Pt demonstrates deficits with strength/ROM/mobility. Eager to participate. Would benefit from skilled PT to address above deficits and promote optimal return to PLOF. Recommend transition to [place] upon discharge from acute hospitalization.     Follow Up Recommendations Home health PT    Equipment Recommendations  Rolling walker with 5" wheels    Recommendations for Other Services       Precautions / Restrictions Precautions Precautions: Fall;Knee Precaution Booklet Issued: No Restrictions Weight Bearing Restrictions: Yes RUE Weight Bearing: Weight bearing as tolerated      Mobility  Bed Mobility Overal bed mobility: Needs Assistance Bed Mobility: Supine to Sit     Supine to sit: Min guard     General bed mobility comments: follows commands well. Once seated at EOB, upright posture. Guidance for B LE management  Transfers Overall transfer level: Needs assistance Equipment used: Rolling walker (2 wheeled) Transfers: Sit to/from Stand Sit to Stand: Min guard         General transfer comment: multiple standing from various surfaces. RW and cues for hand placement.   Ambulation/Gait Ambulation/Gait assistance: Min guard Gait Distance (Feet): 10 Feet Assistive device: Rolling walker (2 wheeled) Gait Pattern/deviations: Step-to pattern     General Gait Details: ambulated from bed->chair->BSC->chair. Short step to gait pattern. Fatigues  Financial trader Rankin (Stroke Patients Only)        Balance Overall balance assessment: Needs assistance Sitting-balance support: Feet supported Sitting balance-Leahy Scale: Good     Standing balance support: Bilateral upper extremity supported Standing balance-Leahy Scale: Good                               Pertinent Vitals/Pain Pain Assessment: Faces Faces Pain Scale: Hurts a little bit Pain Location: R knee Pain Descriptors / Indicators: Operative site guarding Pain Intervention(s): Limited activity within patient's tolerance;Ice applied    Home Living Family/patient expects to be discharged to:: Private residence Living Arrangements: Alone Available Help at Discharge: Family(nephew plans to stay with him) Type of Home: House Home Access: Ramped entrance     Home Layout: One level Home Equipment: Schulter - 4 wheels;Cane - single point;Crutches      Prior Function Level of Independence: Independent with assistive device(s)         Comments: ambulating household distances using B SPCs. Sleeps in lift chair     Hand Dominance        Extremity/Trunk Assessment   Upper Extremity Assessment Upper Extremity Assessment: Overall WFL for tasks assessed    Lower Extremity Assessment Lower Extremity Assessment: Generalized weakness(R LE grossly 3/5)       Communication   Communication: No difficulties  Cognition Arousal/Alertness: Awake/alert Behavior During Therapy: WFL for tasks assessed/performed Overall Cognitive Status: Within Functional Limits for tasks assessed  General Comments      Exercises Total Joint Exercises Goniometric ROM: R knee AAROM: 2-63 degrees Other Exercises Other Exercises: Pt performed supine ther-ex including R LE AP, SLRs, hip abd/add, and quad sets. 10 reps with cga Other Exercises: Ambulated to bsc. Needs min assist for set up, hygiene management.   Assessment/Plan    PT Assessment Patient needs continued PT  services  PT Problem List Decreased strength;Decreased range of motion;Decreased activity tolerance;Decreased mobility;Pain       PT Treatment Interventions DME instruction;Gait training;Stair training;Therapeutic exercise    PT Goals (Current goals can be found in the Care Plan section)  Acute Rehab PT Goals Patient Stated Goal: to get stronger PT Goal Formulation: With patient Time For Goal Achievement: 04/25/19 Potential to Achieve Goals: Good    Frequency BID   Barriers to discharge        Co-evaluation               AM-PAC PT "6 Clicks" Mobility  Outcome Measure Help needed turning from your back to your side while in a flat bed without using bedrails?: A Little Help needed moving from lying on your back to sitting on the side of a flat bed without using bedrails?: A Little Help needed moving to and from a bed to a chair (including a wheelchair)?: A Little Help needed standing up from a chair using your arms (e.g., wheelchair or bedside chair)?: A Little Help needed to walk in hospital room?: A Little Help needed climbing 3-5 steps with a railing? : A Lot 6 Click Score: 17    End of Session Equipment Utilized During Treatment: Gait belt Activity Tolerance: Patient tolerated treatment well Patient left: in chair;with chair alarm set;with SCD's reapplied Nurse Communication: Mobility status PT Visit Diagnosis: Muscle weakness (generalized) (M62.81);Difficulty in walking, not elsewhere classified (R26.2);Pain Pain - Right/Left: Right Pain - part of body: Knee    Time: 1540-1620 PT Time Calculation (min) (ACUTE ONLY): 40 min   Charges:   PT Evaluation $PT Eval Low Complexity: 1 Low PT Treatments $Therapeutic Exercise: 8-22 mins $Therapeutic Activity: 8-22 mins        Greggory Stallion, PT, DPT [phone]   Madilyn Cephas 04/11/2019, 4:23 PM

## 2019-04-11 NOTE — TOC Progression Note (Signed)
Transition of Care [facility]) - Progression Note    Patient Details  Name: Christian [name] MRN: [id number] Date of Birth: 02-27-1935  [facility] CM/SW Moore, RN Phone Number: 04/11/2019, 10:09 AM  Clinical Narrative:     Requested the price of Lovenox 40 mg daily X 14 days will notify the patient of the cost once obtained       Expected Discharge Plan and Services                                                 Social Determinants of Health (SDOH) Interventions    Readmission Risk Interventions No flowsheet data found.

## 2019-04-11 NOTE — Anesthesia Procedure Notes (Signed)
Spinal  Patient location during procedure: OR Start time: 04/11/2019 7:21 AM End time: 04/11/2019 7:28 AM Staffing Resident/CRNA: Nelda Marseille, CRNA Performed: resident/CRNA  Preanesthetic Checklist Completed: patient identified, site marked, surgical consent, pre-op evaluation, timeout performed, IV checked, risks and benefits discussed and monitors and equipment checked Spinal Block Patient position: sitting Prep: Betadine Patient monitoring: heart rate, continuous pulse ox, blood pressure and cardiac monitor Approach: midline Location: L4-5 Injection technique: single-shot Needle Needle type: Whitacre and Introducer  Needle gauge: 25 G Needle length: 9 cm Assessment Sensory level: T10 Additional Notes Negative paresthesia. Negative blood return. Positive free-flowing CSF. Expiration date of kit checked and confirmed. Patient tolerated procedure well, without complications.

## 2019-04-12 ENCOUNTER — Encounter: Payer: Self-pay | Admitting: Orthopedic Surgery

## 2019-04-12 LAB — GLUCOSE, CAPILLARY
Glucose-Capillary: 140 mg/dL — ABNORMAL HIGH (ref 70–99)
Glucose-Capillary: 140 mg/dL — ABNORMAL HIGH (ref 70–99)
Glucose-Capillary: 158 mg/dL — ABNORMAL HIGH (ref 70–99)
Glucose-Capillary: 235 mg/dL — ABNORMAL HIGH (ref 70–99)

## 2019-04-12 NOTE — Progress Notes (Signed)
Physical Therapy Treatment Patient Details Name: Christian [name] MRN: [id number] DOB: Jun 13, 1935 Today's Date: 04/12/2019    History of Present Illness Pt admitted for R TKR.    PT Comments    Patient received in recliner, reports he is doing well, has a full belly. Reports pain to be 2/10.  Patient performed sit to stand with supervision. Cues needed initially to stay inside RW. Patient is able to ambulate 200 feet with RW, sba. Continues to be slightly impulsive with mobility. Patient then performed LE strengthening and ROM exercises in bed. Patient will benefit from continued skilled PT to address his strength, decreased functional independence and safety with mobility.      Follow Up Recommendations  Home health PT     Equipment Recommendations  Rolling walker with 5" wheels    Recommendations for Other Services       Precautions / Restrictions Precautions Precautions: Fall Restrictions Weight Bearing Restrictions: Yes RUE Weight Bearing: Weight bearing as tolerated    Mobility  Bed Mobility Overal bed mobility: Independent Bed Mobility: Sit to Supine     Supine to sit: Supervision Sit to supine: Independent   General bed mobility comments: Not assessed this session, patient received up in recliner and returned to recliner  Transfers Overall transfer level: Modified independent Equipment used: Rolling walker (2 wheeled) Transfers: Sit to/from Stand Sit to Stand: Modified independent (Device/Increase time);Supervision         General transfer comment: Cues for hand placement, safety  Ambulation/Gait Ambulation/Gait assistance: Min guard Gait Distance (Feet): 200 Feet Assistive device: Rolling walker (2 wheeled) Gait Pattern/deviations: Step-through pattern     General Gait Details: slightly impulsive with mobility, requires cues to stay close to AD, picks up RW to [facility] around things at times.   Stairs             Wheelchair Mobility     Modified Rankin (Stroke Patients Only)       Balance Overall balance assessment: Modified Independent Sitting-balance support: No upper extremity supported;Feet supported Sitting balance-Leahy Scale: Good     Standing balance support: Bilateral upper extremity supported Standing balance-Leahy Scale: Good                              Cognition Arousal/Alertness: Awake/alert Behavior During Therapy: WFL for tasks assessed/performed Overall Cognitive Status: Within Functional Limits for tasks assessed                                        Exercises Total Joint Exercises Ankle Circles/Pumps: AROM;Both;20 reps;Supine Quad Sets: AROM;10 reps;Right;Supine Heel Slides: AROM;10 reps;Right;Supine Hip ABduction/ADduction: AROM;Right;Supine;15 reps Straight Leg Raises: AROM;10 reps;Right;Supine Goniometric ROM: 3-90 Other Exercises Other Exercises: pt instructed in AE/DME, compression stocking mgt, polar care mgt, falls prevention including pet care considerations, and home/routines modifications; handout provided    General Comments        Pertinent Vitals/Pain Pain Assessment: 0-10 Pain Score: 2  Pain Location: R knee Pain Descriptors / Indicators: Discomfort;Operative site guarding Pain Intervention(s): Monitored during session;Ice applied    Home Living                      Prior Function            PT Goals (current goals can now be found in the care plan section) Acute Rehab  PT Goals Patient 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 PT Goal Formulation: With patient Time For Goal Achievement: 04/25/19 Potential to Achieve Goals: Good Progress towards PT goals: Progressing toward goals    Frequency    BID      PT Plan Current plan remains appropriate    Co-evaluation              AM-PAC PT "6 Clicks" Mobility   Outcome Measure  Help needed turning from your back to your side while in a flat bed  without using bedrails?: A Little Help needed moving from lying on your back to sitting on the side of a flat bed without using bedrails?: A Little Help needed moving to and from a bed to a chair (including a wheelchair)?: A Little Help needed standing up from a chair using your arms (e.g., wheelchair or bedside chair)?: A Little Help needed to walk in hospital room?: A Little Help needed climbing 3-5 steps with a railing? : A Little 6 Click Score: 18    End of Session Equipment Utilized During Treatment: Gait belt Activity Tolerance: Patient tolerated treatment well Patient left: in bed;with bed alarm set Nurse Communication: Mobility status PT Visit Diagnosis: Muscle weakness (generalized) (M62.81);Difficulty in walking, not elsewhere classified (R26.2);Pain Pain - Right/Left: Right Pain - part of body: Knee     Time: 1300-1330 PT Time Calculation (min) (ACUTE ONLY): 30 min  Charges:  $Gait Training: 8-22 mins $Therapeutic Exercise: 8-22 mins                     Shantice Menger, PT, GCS 04/12/19,1:35 PM

## 2019-04-12 NOTE — Progress Notes (Signed)
ORTHOPAEDICS PROGRESS NOTE  PATIENT NAME: Christian [name] DOB: [birth year]  MRN: [id number]  POD # 1: Right total knee arthroplasty  Subjective: The patient rested well last night.  Pain is been under good control.  No nausea or vomiting. The patient tolerated physical therapy well yesterday on the day of surgery.  He ambulated approximately 10 feet.  Objective: Vital signs in last 24 hours: Temp:  [97.5 F (36.4 C)-98 F (36.7 C)] 98 F (36.7 C) (07/07 0510) Pulse Rate:  [53-68] 56 (07/07 0510) Resp:  [17-46] 18 (07/07 0510) BP: (129-170)/(52-98) 143/55 (07/07 0510) SpO2:  [94 %-100 %] 96 % (07/07 0510)  Intake/Output from previous day: 07/06 0701 - 07/07 0700 In: 4437.9 [P.O.:420; I.V.:3417.9; IV Piggyback:600] Out: 2400 [Urine:2150; Drains:200; Blood:50]  No results for input(s): WBC, HGB, HCT, PLT, K, CL, CO2, BUN, CREATININE, GLUCOSE, CALCIUM, LABPT, INR in the last 72 hours.  EXAM General: Well-developed well-nourished male seen in no apparent discomfort. Lungs: clear to auscultation Cardiac: normal rate and regular rhythm Abdomen: Soft, nontender, nondistended.  Bowel sounds are present. Right lower extremity: Dressing is dry and intact.  Hemovac drain and Polar Care are intact and functioning.  The patient is able to perform an independent straight leg raise.  Homans test is negative. Neurologic: Awake, alert, and oriented.  Sensory and motor function are intact.  Assessment: Right total knee arthroplasty  Secondary diagnoses: Spinal stenosis Hypercholesterolemia Hypertension History of nephrolithiasis Diabetes Diabetic neuropathy  Plan: Notes from physical therapy were reviewed.  Continue with physical therapy and Occupational Therapy as per total knee arthroplasty rehab protocol. Today's goals were reviewed with the patient.  Plan is to go Home after hospital stay. DVT Prophylaxis - Lovenox, Foot Pumps and TED hose  James P. Holley Bouche M.D.

## 2019-04-12 NOTE — TOC Benefit Eligibility Note (Signed)
Transition of Care [facility]) Benefit Eligibility Note    Patient Details  Name: Christian [name] MRN: [id number] Date of Birth: 08/30/1935   Medication/Dose: Lovenox 40mg  once daily for 14 days.  Covered?: No  Prescription Coverage Preferred Pharmacy: [place] with Person/Company/Phone Number:: [place] with Manson Passey at [phone]  Prior Approval: Yes(PA required for name brand: [phone])  Deductible: Unmet  Additional Notes: Generic Enoxaparin covered with no PA required.  Considered Tier 4.  Estimated copay: $90.00.    Dannette Barbara Phone Number: [phone] or [phone] 04/12/2019, 8:44 AM

## 2019-04-12 NOTE — TOC Initial Note (Signed)
Transition of Care [facility]) - Initial/Assessment Note    Patient Details  Name: Christian [name] MRN: [id number] Date of Birth: Aug 14, 1935  Transition of Care [facility]) CM/SW Contact:    Su Hilt, RN Phone Number: 04/12/2019, 2:12 PM  Clinical Narrative:                  Patient lives alone and his Nephew Gene will come stay with him, he needs a RW, notified Leroy Sea  He would like to use [facility], notified Helene Kelp He was given the price of Lovenox and states he can afford his medication Patient  Sees Dr Dennard Schaumann Uses [facility] His nephew and his daughter will provide transportation to the doctor and so forth No further needs  Expected Discharge Plan: [place] Services Barriers to Discharge: Continued Medical Work up   Patient Goals and CMS Choice   CMS Medicare.gov Compare Post Acute Care list provided to:: Patient Choice offered to / list presented to : Patient  Expected Discharge Plan and Services Expected Discharge Plan: [place]   Discharge Planning Services: CM Consult Post Acute Care Choice: [place] arrangements for the past 2 months: [place]                 DME Arranged: [place]ford Rile rolling DME Agency: [employer] Date DME Agency Contacted: 04/12/19 Time DME Agency Contacted: 56 Representative spoke with at DME Agency: [place]: PT [place]: [facility] [employer]) Date [place]: 04/12/19 Time [place]: 76 Representative spoke with at [place] Arrangements/Services Living arrangements for the past 2 months: [place] with:: Self Patient language and need for interpreter reviewed:: No Do you feel safe going back to the place where you live?: Yes      Need for Family Participation in Patient Care: No (Comment) Care giver support system in place?: Yes (comment) Current home services: DME(rolator,  raised toilet, grab bars) Criminal Activity/Legal Involvement Pertinent to Current Situation/Hospitalization: No - Comment as needed  Activities of Daily Living Home Assistive Devices/Equipment: Cane (specify quad or straight), Eyeglasses, CBG Meter ADL Screening (condition at time of admission) Patient's cognitive ability adequate to safely complete daily activities?: Yes Is the patient deaf or have difficulty hearing?: No Does the patient have difficulty seeing, even when wearing glasses/contacts?: No Does the patient have difficulty concentrating, remembering, or making decisions?: No Patient able to express need for assistance with ADLs?: Yes Does the patient have difficulty dressing or bathing?: No Independently performs ADLs?: Yes (appropriate for developmental age) Communication: Independent Dressing (OT): Independent Grooming: Independent Feeding: Independent Bathing: Independent Toileting: Independent In/Out Bed: Independent Walks in Home: Independent with device (comment) Does the patient have difficulty walking or climbing stairs?: Yes Weakness of Legs: Right Weakness of Arms/Hands: None  Permission Sought/Granted                  Emotional Assessment Appearance:: Appears stated age Attitude/Demeanor/Rapport: Engaged Affect (typically observed): Accepting Orientation: : Oriented to Self, Oriented to Place, Oriented to  Time, Oriented to Situation Alcohol / Substance Use: Not Applicable Psych Involvement: No (comment)  Admission diagnosis:  PRIMARY OSTEOARTHRITIS OF RIGHT KNEE Patient Active Problem List   Diagnosis Date Noted  . Total knee replacement status 04/11/2019  . Diabetes mellitus type 2 with complications ([place]) 75/64/3329  . CKD (chronic kidney disease) stage 3, GFR 30-59 ml/min (HCC) 09/02/2017  . Primary  osteoarthritis of right knee 06/22/2017  . S/P lumbar laminectomy 11/22/2014  . 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 Bilateral lower extremity edema 06/14/2014  . Low back pain 03/13/2014  . Bilateral pseudophakia 06/27/2013  . Diabetic neuropathy ([place])   . Skin cancer of arm 04/20/2013  . HTN (hypertension) 03/03/2013  . Hypercholesterolemia    PCP:  Susy Frizzle, MD Pharmacy:   [facility] [place] [place] - [address] [place] [address] Phone: [phone] Fax: [phone]     Social Determinants of Health (SDOH) Interventions    Readmission Risk Interventions No flowsheet data found.

## 2019-04-12 NOTE — Evaluation (Signed)
Occupational Therapy Evaluation Patient Details Name: Christian [name] MRN: [id number] DOB: [birth year] Today's Date: 04/12/2019    History of Present Illness Pt admitted for R TKR.   Clinical Impression   Pt seen for OT evaluation this date, POD#1 from above surgery. Pt was independent in all ADLs prior to surgery, however using bilateral SPCs for ambulation. 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 Pt reports his nephew will be staying with him during his recovery. Pt currently requires CGA assist for LB dressing and bathing when transitioning to standing due to pain and limited AROM of R knee. Pt able to demo proper technique of AE to don underwear without initial instruction, as pt reports this is how he gets dressed typically. Pt reports minimal R knee pain throughout session. Pt instructed in polar care mgt, falls prevention strategies including pet care considerations, home/routines modifications, DME/AE for LB bathing and dressing tasks, and compression stocking mgt. Handout provided. Pt verbalized understanding of all instruction and denies additional needs at this time. Do not currently anticipate any further OT needs.  Will sign off.     Follow Up Recommendations  No OT follow up    Equipment Recommendations  3 in 1 bedside commode    Recommendations for Other Services       Precautions / Restrictions Precautions Precautions: Fall;Knee Precaution Booklet Issued: No Restrictions Weight Bearing Restrictions: Yes RUE Weight Bearing: Weight bearing as tolerated      Mobility Bed Mobility   Bed Mobility: Supine to Sit     Supine to sit: Supervision        Transfers Overall transfer level: Needs assistance Equipment used: Rolling walker (2 wheeled) Transfers: Sit to/from Stand Sit to Stand: Min guard         General transfer comment: instruction in hand/foot  positioning to improve indep/safety of transfer    Balance Overall balance assessment: Needs assistance Sitting-balance support: Feet supported Sitting balance-Leahy Scale: Good     Standing balance support: Bilateral upper extremity supported Standing balance-Leahy Scale: Good                             ADL either performed or assessed with clinical judgement   ADL Overall ADL's : Needs assistance/impaired                                       General ADL Comments: Pt at supervision level for LB ADL from sit to stand position with AE; CGA for ADL transfers using RW     Vision Baseline Vision/History: Wears glasses Wears Glasses: Reading only Patient Visual Report: No change from baseline Vision Assessment?: No apparent visual deficits     Perception     Praxis      Pertinent Vitals/Pain Pain Assessment: 0-10 Pain Score: 2  Pain Location: R knee Pain Descriptors / Indicators: Operative site guarding Pain Intervention(s): Limited activity within patient's tolerance;Monitored during session;Repositioned;Ice applied     Hand Dominance Right   Extremity/Trunk Assessment Upper Extremity Assessment Upper Extremity Assessment: Overall WFL for tasks assessed   Lower Extremity Assessment Lower Extremity Assessment: Defer to PT evaluation;Generalized weakness   Cervical / Trunk Assessment Cervical / Trunk Assessment: Normal   Communication Communication Communication: No difficulties   Cognition  Arousal/Alertness: Awake/alert Behavior During Therapy: WFL for tasks assessed/performed Overall Cognitive Status: Within Functional Limits for tasks assessed                                     General Comments       Exercises Other Exercises Other Exercises: pt instructed in AE/DME, compression stocking mgt, polar care mgt, falls prevention including pet care considerations, and home/routines modifications; handout provided    Shoulder Instructions      Home Living Family/patient expects to be discharged to:: Private residence Living Arrangements: Alone Available Help at Discharge: Family(nephew plans to stay with him) Type of Home: House Home Access: Ramped entrance     Home Layout: One level     Bathroom Shower/Tub: Hospital doctor Toilet: Handicapped height     Home Equipment: Environmental consultant - 4 wheels;Cane - single point;Crutches          Prior Functioning/Environment Level of Independence: Independent with assistive device(s)        Comments: ambulating household distances using B SPCs. 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 OT Goal Formulation: All assessment and education complete, DC therapy  OT Frequency:     Barriers to D/C:            Co-evaluation              AM-PAC OT "6 Clicks" Daily Activity     Outcome Measure Help from another person eating meals?: None Help from another person taking care of personal grooming?: None Help from another person toileting, which includes using toliet, bedpan, or urinal?: A Little Help from another person bathing (including washing, rinsing, drying)?: A Little Help from another person to put on and taking off regular upper body clothing?: None Help from another person to put on and taking off regular lower body clothing?: A Little 6 Click Score: 21   End of Session Equipment Utilized During Treatment: Gait belt;Rolling walker  Activity Tolerance: Patient tolerated treatment well Patient left: in chair;with call bell/phone within reach;with chair alarm set;with SCD's reapplied;Other (comment)(polar care in place)  OT Visit Diagnosis: Other abnormalities of gait and mobility  (R26.89);Pain Pain - Right/Left: Right Pain - part of body: Knee                Time: 9629-5284 OT Time Calculation (min): 26 min Charges:  OT General Charges $OT Visit: 1 Visit OT Evaluation $OT Eval Low Complexity: 1 Low OT Treatments $Self Care/Home Management : 8-22 mins $Therapeutic Activity: 8-22 mins  Jeni Salles, MPH, MS, OTR/L ascom [phone] 04/12/19, 9:39 AM

## 2019-04-12 NOTE — Anesthesia Postprocedure Evaluation (Signed)
Anesthesia Post Note  Patient: Christian [name]  Procedure(s) Performed: COMPUTER ASSISTED TOTAL KNEE ARTHROPLASTY (Right )  Patient location during evaluation: Nursing Unit Anesthesia Type: Spinal Level of consciousness: oriented and awake and alert Pain management: pain level controlled Vital Signs Assessment: post-procedure vital signs reviewed and stable Respiratory status: spontaneous breathing and respiratory function stable Cardiovascular status: blood pressure returned to baseline and stable Postop Assessment: no headache, no backache, no apparent nausea or vomiting and patient able to bend at knees Anesthetic complications: no     Last Vitals:  Vitals:   04/11/19 2317 04/12/19 0510  BP: (!) 131/52 (!) 143/55  Pulse: (!) 55 (!) 56  Resp: 18 18  Temp: (!) 36.4 C 36.7 C  SpO2: 99% 96%    Last Pain:  Vitals:   04/12/19 0608  TempSrc:   PainSc: 3                  Ieasha Boerema Lorenza Chick

## 2019-04-12 NOTE — Progress Notes (Signed)
Physical Therapy Treatment Patient Details Name: Christian [name] MRN: [id number] DOB: May 14, 1935 Today's Date: 04/12/2019    History of Present Illness Pt admitted for R TKR.    PT Comments    Patient received in recliner this morning. Reports minimal pain in right knee. Agrees to participate in PT. Patient requires cues for safety with transfers, slightly impulsive with mobility. Able to ambulate 150 with RW and min guard/supervision with RW. Cues to stay close to RW. Patient then performed supine LE strengthening and ROM exercises with min A. Patient knee rom improved this date to near 90 degrees of flexion. Patient will benefit from continued skilled PT to address his weakness, safety with mobility and improve functional independence.          Follow Up Recommendations  Home health PT     Equipment Recommendations  Rolling walker with 5" wheels    Recommendations for Other Services       Precautions / Restrictions Precautions Precautions: Fall Precaution Booklet Issued: No Restrictions Weight Bearing Restrictions: Yes RUE Weight Bearing: Weight bearing as tolerated    Mobility  Bed Mobility   Bed Mobility: Supine to Sit     Supine to sit: Supervision     General bed mobility comments: Not assessed this session, patient received up in recliner and returned to recliner  Transfers Overall transfer level: Needs assistance Equipment used: Rolling walker (2 wheeled) Transfers: Sit to/from Stand Sit to Stand: Min guard         General transfer comment: Cues for hand placement, safety  Ambulation/Gait Ambulation/Gait assistance: Min guard;Supervision Gait Distance (Feet): 150 Feet Assistive device: Rolling walker (2 wheeled) Gait Pattern/deviations: Step-through pattern     General Gait Details: slightly impulsive with mobility   Stairs             Wheelchair Mobility    Modified Rankin (Stroke Patients Only)       Balance Overall balance  assessment: Needs assistance;Mild deficits observed, not formally tested Sitting-balance support: Feet supported Sitting balance-Leahy Scale: Good     Standing balance support: Bilateral upper extremity supported Standing balance-Leahy Scale: Good                              Cognition Arousal/Alertness: Awake/alert Behavior During Therapy: WFL for tasks assessed/performed Overall Cognitive Status: Within Functional Limits for tasks assessed                                        Exercises Total Joint Exercises Ankle Circles/Pumps: AROM;10 reps;Both Quad Sets: AROM;10 reps;Right Heel Slides: AAROM;10 reps;Right Hip ABduction/ADduction: AAROM;10 reps;Right Straight Leg Raises: AROM;Right;10 reps Goniometric ROM: 3-85 Other Exercises Other Exercises: pt instructed in AE/DME, compression stocking mgt, polar care mgt, falls prevention including pet care considerations, and home/routines modifications; handout provided    General Comments        Pertinent Vitals/Pain Pain Assessment: 0-10 Pain Score: 2  Pain Location: R knee Pain Descriptors / Indicators: Discomfort;Operative site guarding Pain Intervention(s): Repositioned;Monitored during session;Ice applied    Home Living Family/patient expects to be discharged to:: Private residence Living Arrangements: Alone Available Help at Discharge: Family(nephew plans to stay with him) Type of Home: House Home Access: Ramped entrance   Home Layout: One level Home Equipment: Garber - 4 wheels;Cane - single point;Crutches      Prior Function Level  of Independence: Independent with assistive device(s)      Comments: ambulating household distances using B SPCs. 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 PT Goal Formulation: With patient Time For Goal Achievement:  04/25/19 Potential to Achieve Goals: Good Progress towards PT goals: Progressing toward goals    Frequency    BID      PT Plan Current plan remains appropriate    Co-evaluation              AM-PAC PT "6 Clicks" Mobility   Outcome Measure  Help needed turning from your back to your side while in a flat bed without using bedrails?: A Little Help needed moving from lying on your back to sitting on the side of a flat bed without using bedrails?: A Little Help needed moving to and from a bed to a chair (including a wheelchair)?: A Little Help needed standing up from a chair using your arms (e.g., wheelchair or bedside chair)?: A Little Help needed to walk in hospital room?: A Little Help needed climbing 3-5 steps with a railing? : A Little 6 Click Score: 18    End of Session Equipment Utilized During Treatment: Gait belt Activity Tolerance: Patient tolerated treatment well Patient left: in chair;with chair alarm set;with call bell/phone within reach Nurse Communication: Mobility status PT Visit Diagnosis: Muscle weakness (generalized) (M62.81);Difficulty in walking, not elsewhere classified (R26.2);Pain Pain - Right/Left: Right Pain - part of body: Knee     Time: 1000-1030 PT Time Calculation (min) (ACUTE ONLY): 30 min  Charges:  $Gait Training: 8-22 mins $Therapeutic Exercise: 8-22 mins                     Amedee Cerrone, PT, GCS 04/12/19,10:45 AM

## 2019-04-13 LAB — GLUCOSE, CAPILLARY
Glucose-Capillary: 105 mg/dL — ABNORMAL HIGH (ref 70–99)
Glucose-Capillary: 134 mg/dL — ABNORMAL HIGH (ref 70–99)

## 2019-04-13 MED ORDER — OXYCODONE HCL 5 MG PO TABS: 5.0000 mg | ORAL_TABLET | ORAL | 0 refills | Status: DC | PRN

## 2019-04-13 MED ORDER — ENOXAPARIN SODIUM 40 MG/0.4ML ~~LOC~~ SOLN: 40.0000 mg | SUBCUTANEOUS | 0 refills | Status: DC

## 2019-04-13 MED ORDER — TRAMADOL HCL 50 MG PO TABS: 50.0000 mg | ORAL_TABLET | Freq: Four times a day (QID) | ORAL | 0 refills | Status: AC | PRN

## 2019-04-13 NOTE — Discharge Summary (Signed)
Physician Discharge Summary  Patient ID: Christian [name] MRN: [id number] DOB/AGE: December 09, 1934 84 y.o.  Admit date: 04/11/2019 Discharge date: 04/13/2019  Admission Diagnoses:  PRIMARY OSTEOARTHRITIS OF RIGHT KNEE  Discharge Diagnoses: Patient Active Problem List   Diagnosis Date Noted  . Total knee replacement status 04/11/2019  . Diabetes mellitus type 2 with complications ([facility]) 36/62/9476  . CKD (chronic kidney disease) stage 3, GFR 30-59 ml/min (HCC) 09/02/2017  . Primary osteoarthritis of right knee 06/22/2017  . S/P lumbar laminectomy 11/22/2014  . 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 Bilateral lower extremity edema 06/14/2014  . Low back pain 03/13/2014  . Bilateral pseudophakia 06/27/2013  . Diabetic neuropathy ([place])   . Skin cancer of arm 04/20/2013  . HTN (hypertension) 03/03/2013  . Hypercholesterolemia     Past Medical History:  Diagnosis Date  . Arthritis   . Back pain   . Diabetes mellitus    Type 2  . Diabetic neuropathy ([place])   . History of kidney stones   . HTN (hypertension) 03/03/2013  . Hypercholesterolemia   . Leg cramps   . Lower extremity edema   . 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 Skin cancer of arm 04/20/2013   left upper arm  . Spinal stenosis 05/17/2014   L3-L4   . Urgency of urination      Transfusion: None.   Consultants (if any):   Discharged Condition: Improved  Hospital Course: DATHAN ATTIA is an 84 y.o. male who was admitted 04/11/2019 with a diagnosis of primary osteoarthritis of the right knee and went to the operating room on 04/11/2019 and underwent the above named procedures.    Surgeries: Procedure(s): COMPUTER ASSISTED TOTAL KNEE ARTHROPLASTY on 04/11/2019 Patient tolerated the surgery well. Taken to PACU where she was stabilized and then transferred to the orthopedic floor.  Started on Lovenox 30mg  q 12 hrs. Foot pumps applied bilaterally at 80 mm. Heels elevated on bed with rolled  towels. No evidence of DVT. Negative [place]. Physical therapy started on day #1 for gait training and transfer. OT started day #1 for ADL and assisted devices.  Patient's IV was removed on POD2, Foley removed on POD1, Hemovac remove don POD2.  Implants: DePuy Attune size 6 posterior stabilized femoral component (cemented), size 7 rotating platform tibial component (cemented), 38 mm medialized dome patella (cemented), and a 5 mm stabilized rotating platform polyethylene insert.  He was given perioperative antibiotics:  Anti-infectives (From admission, onward)   Start     Dose/Rate Route Frequency Ordered Stop   04/11/19 1530  ceFAZolin (ANCEF) IVPB 2g/100 mL premix     2 g 200 mL/hr over 30 Minutes Intravenous Every 6 hours 04/11/19 1137 04/12/19 0904   04/11/19 0702  vancomycin (VANCOCIN) 1-5 GM/200ML-% IVPB    Note to Pharmacy: Cleatis Polka   : cabinet override      04/11/19 0702 04/11/19 0719   04/11/19 0612  ceFAZolin (ANCEF) 2-4 GM/100ML-% IVPB    Note to Pharmacy: Cleatis Polka   : cabinet override      04/11/19 0612 04/11/19 0738   04/11/19 0600  ceFAZolin (ANCEF) IVPB 2g/100 mL premix     2 g 200 mL/hr over 30 Minutes Intravenous On call to O.R. 04/11/19 0520 04/11/19 0753   04/11/19 0530  vancomycin (VANCOCIN) IVPB 1000 mg/200 mL premix     1,000 mg 200 mL/hr over 60 Minutes Intravenous  Once 04/11/19 0520 04/11/19 0749    .  He was given sequential compression  devices, early ambulation, and Lovenox for DVT prophylaxis.  He benefited maximally from the hospital stay and there were no complications.    Recent vital signs:  Vitals:   04/12/19 2322 04/13/19 0749  BP: (!) 154/61 (!) 142/77  Pulse: 60 63  Resp: 18 16  Temp: 97.7 F (36.5 C) 97.8 F (36.6 C)  SpO2: 99% 98%    Recent laboratory studies:  Lab Results  Component Value Date   HGB 11.1 (L) 03/30/2019   HGB 12.4 (L) 09/27/2018   HGB 10.3 (L) 02/22/2018   Lab Results  Component Value Date   WBC 8.8  03/30/2019   PLT 193 03/30/2019   Lab Results  Component Value Date   INR 1.1 03/30/2019   Lab Results  Component Value Date   NA 141 03/30/2019   K 4.1 03/30/2019   CL 111 03/30/2019   CO2 21 (L) 03/30/2019   BUN 26 (H) 03/30/2019   CREATININE 1.41 (H) 03/30/2019   GLUCOSE 77 03/30/2019    Discharge Medications:   Allergies as of 04/13/2019      Reactions   Actos [pioglitazone]    LE Edema--03/2014      Medication List    STOP taking these medications   aspirin EC 81 MG tablet   HYDROcodone-acetaminophen 7.5-325 MG tablet Commonly known as: NORCO     TAKE these medications   acetaminophen 500 MG tablet Commonly known as: TYLENOL Take 1,000 mg by mouth every 8 (eight) hours as needed for mild pain or moderate pain (back pain).   B-12 PO Take 5,000 mcg by mouth daily.   enoxaparin 40 MG/0.4ML injection Commonly known as: LOVENOX Inject 0.4 mLs (40 mg total) into the skin daily.   Fish Oil 1000 MG Caps Take by mouth.   fish oil-omega-3 fatty acids 1000 MG capsule Take 1 g by mouth 2 (two) times daily.   furosemide 20 MG tablet Commonly known as: LASIX Take 20 mg by mouth daily.   gabapentin 600 MG tablet Commonly known as: NEURONTIN TAKE 1 TABLET BY MOUTH THREE TIMES DAILY   Insulin Pen Needle 32G X 6 MM Misc Commonly known as: BD Pen Needle Micro U/F Use with insulin pen   Lantus SoloStar 100 UNIT/ML Solostar Pen Generic drug: Insulin Glargine INJECT 64 UNITS INTO THE SKIN DAILY What changed: See the new instructions.   LEG CRAMPS PO Take 2 tablets by mouth as needed.   lisinopril 5 MG tablet Commonly known as: ZESTRIL Take 1 tablet (5 mg total) by mouth daily.   metFORMIN 850 MG tablet Commonly known as: Glucophage Take 1 tablet (850 mg total) by mouth 2 (two) times daily with a meal.   niacin 500 MG CR tablet Commonly known as: NIASPAN TAKE 1 TABLET BY MOUTH AT BEDTIME   ONE TOUCH ULTRA TEST test strip Generic drug: glucose blood USE  TO CHECK FASTING BLOOD SUGARS THREE TIMES DAILY   oxyCODONE 5 MG immediate release tablet Commonly known as: Oxy IR/ROXICODONE Take 1-2 tablets (5-10 mg total) by mouth every 4 (four) hours as needed for moderate pain.   simvastatin 40 MG tablet Commonly known as: ZOCOR Take 1 tablet by mouth once daily with breakfast What changed: See the new instructions.   traMADol 50 MG tablet Commonly known as: ULTRAM Take 1 tablet (50 mg total) by mouth every 6 (six) hours as needed for moderate pain.   Vitamin D 125 MCG (5000 UT) Caps Take 5,000 Units by mouth daily.  Durable Medical Equipment  (From admission, onward)         Start     Ordered   04/11/19 1137  DME Walker rolling  Once    Question:  Patient needs a walker to treat with the following condition  Answer:  Total knee replacement status   04/11/19 1137   04/11/19 1137  DME Bedside commode  Once    Question:  Patient needs a bedside commode to treat with the following condition  Answer:  Total knee replacement status   04/11/19 1137          Diagnostic Studies: Dg Knee Right Port  Result Date: 04/11/2019 CLINICAL DATA:  Degenerative arthrosis of the right knee. EXAM: PORTABLE RIGHT KNEE - 1-2 VIEW COMPARISON:  None. FINDINGS: The patient has undergone a right total knee replacement. The components of the prosthesis appear in excellent position. Joint drains in place. IMPRESSION: Satisfactory appearance of the right knee after total knee replacement. Electronically Signed   By: Lorriane Shire M.D.   On: 04/11/2019 11:37   Disposition: Plan for discharge home today pending progress with PT this morning.  Follow-up Information    Watt Climes, PA On 04/26/2019.   Specialty: Physician Assistant Why: at 9:15am Contact information: [place] [address] [phone]        Dereck Leep, MD On 05/17/2019.   Specialty: Orthopedic Surgery Why: at 10:45am Contact information: [place] [address] [phone]          Signed: Judson Roch PA-C 04/13/2019, 8:25 AM

## 2019-04-13 NOTE — Progress Notes (Signed)
Discharge instructions and prescriptions given to pt. IV removed. Dressing changed and intact. No questions from pt at this time. Tech will assist pt with getting dressed and will await daughter to pick up.

## 2019-04-13 NOTE — Plan of Care (Signed)
progessing

## 2019-04-13 NOTE — Progress Notes (Signed)
  Subjective: 2 Days Post-Op Procedure(s) (LRB): COMPUTER ASSISTED TOTAL KNEE ARTHROPLASTY (Right) Patient reports pain as mild.   Patient is well, and has had no acute complaints or problems Plan is to go Home after hospital stay. Negative for chest pain and shortness of breath Fever: no Gastrointestinal:Negative for nausea and vomiting  Objective: Vital signs in last 24 hours: Temp:  [97.7 F (36.5 C)-97.8 F (36.6 C)] 97.8 F (36.6 C) (07/08 0749) Pulse Rate:  [60-63] 63 (07/08 0749) Resp:  [16-18] 16 (07/08 0749) BP: (142-154)/(61-77) 142/77 (07/08 0749) SpO2:  [98 %-99 %] 98 % (07/08 0749)  Intake/Output from previous day:  Intake/Output Summary (Last 24 hours) at 04/13/2019 0820 Last data filed at 04/13/2019 0732 Gross per 24 hour  Intake 1000.34 ml  Output 3065 ml  Net -2064.66 ml    Intake/Output this shift: Total I/O In: -  Out: 250 [Urine:250]  Labs: No results for input(s): HGB in the last 72 hours. No results for input(s): WBC, RBC, HCT, PLT in the last 72 hours. No results for input(s): NA, K, CL, CO2, BUN, CREATININE, GLUCOSE, CALCIUM in the last 72 hours. No results for input(s): LABPT, INR in the last 72 hours.   EXAM General - Patient is Alert, Appropriate and Oriented Extremity - ABD soft Sensation intact distally Intact pulses distally Dorsiflexion/Plantar flexion intact Incision: dressing C/D/I No cellulitis present Dressing/Incision - Bulky dressing removed, woundvac removed.  Minimal drainage to the honeycomb dressing Motor Function - intact, moving foot and toes well on exam.  Abdomen soft with normal BS.  Past Medical History:  Diagnosis Date  . Arthritis   . Back pain   . Diabetes mellitus    Type 2  . Diabetic neuropathy ([place])   . History of kidney stones   . HTN (hypertension) 03/03/2013  . Hypercholesterolemia   . Leg cramps   . Lower extremity edema   . 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 Skin cancer of arm 04/20/2013   left upper arm  . Spinal stenosis 05/17/2014   L3-L4   . Urgency of urination     Assessment/Plan: 2 Days Post-Op Procedure(s) (LRB): COMPUTER ASSISTED TOTAL KNEE ARTHROPLASTY (Right) Active Problems:   Total knee replacement status  Estimated body mass index is 33.45 kg/m as calculated from the following:   Height as of this encounter: 5\' 8"  (1.727 m).   Weight as of this encounter: 99.8 kg. Advance diet Up with therapy D/C IV fluids when tolerating po intake.  Patient has had a BM. Patient will need to clear stairs today prior to discharge. Plan for d/c home this afternoon.  DVT Prophylaxis - Lovenox, Foot Pumps and TED hose Weight-Bearing as tolerated to right leg  J. Cameron Proud, PA-C [facility] 04/13/2019, 8:20 AM

## 2019-04-13 NOTE — Progress Notes (Signed)
Physical Therapy Treatment Patient Details Name: Christian [name] MRN: [id number] DOB: Jan 22, 1935 Today's Date: 04/13/2019    History of Present Illness Pt admitted for R TKR.    PT Comments    Participated in exercises as described below.  Given new ex handouts as he is unable to find his original copy.  Stood and ambulated around nursing unit x 1 with walker and min guard.  Steady and generally safe gait.  Reports having a ramp and home and does not do stairs.  No further questions or concerns noted.   Anticipate discharge today.   Follow Up Recommendations  Home health PT     Equipment Recommendations  Rolling walker with 5" wheels    Recommendations for Other Services       Precautions / Restrictions Precautions Precautions: Fall Restrictions Weight Bearing Restrictions: Yes RUE Weight Bearing: Weight bearing as tolerated    Mobility  Bed Mobility               General bed mobility comments: in recliner.  stated he sleeps in recliner at home.  Transfers Overall transfer level: Modified independent Equipment used: Rolling walker (2 wheeled) Transfers: Sit to/from Stand Sit to Stand: Modified independent (Device/Increase time);Supervision            Ambulation/Gait   Gait Distance (Feet): 200 Feet Assistive device: Rolling walker (2 wheeled) Gait Pattern/deviations: Step-through pattern Gait velocity: decreased   General Gait Details: less impulsive today. overall good safety and no verbal cues needed to correct gait today   Stairs             Wheelchair Mobility    Modified Rankin (Stroke Patients Only)       Balance Overall balance assessment: Modified Independent   Sitting balance-Leahy Scale: Good     Standing balance support: Bilateral upper extremity supported Standing balance-Leahy Scale: Good                              Cognition Arousal/Alertness: Awake/alert Behavior During Therapy: WFL for tasks  assessed/performed Overall Cognitive Status: Within Functional Limits for tasks assessed                                        Exercises Total Joint Exercises Ankle Circles/Pumps: AROM;Both;20 reps;Supine Quad Sets: AROM;10 reps;Right;Supine Heel Slides: AROM;10 reps;Right;Supine Hip ABduction/ADduction: AROM;Right;Supine;15 reps Straight Leg Raises: AROM;10 reps;Right;Supine Long Arc Quad: AROM;Seated;Right;10 reps Knee Flexion: AROM;AAROM;Right;Seated;10 reps Goniometric ROM: 2-100    General Comments        Pertinent Vitals/Pain Pain Assessment: 0-10 Pain Score: 2  Pain Location: R knee Pain Descriptors / Indicators: Discomfort;Operative site guarding Pain Intervention(s): Limited activity within patient's tolerance;Monitored during session;Premedicated before session    Home Living                      Prior Function            PT Goals (current goals can now be found in the care plan section) Progress towards PT goals: Progressing toward goals    Frequency    BID      PT Plan Current plan remains appropriate    Co-evaluation              AM-PAC PT "6 Clicks" Mobility   Outcome Measure  Help needed turning from your back  to your side while in a flat bed without using bedrails?: A Little Help needed moving from lying on your back to sitting on the side of a flat bed without using bedrails?: A Little Help needed moving to and from a bed to a chair (including a wheelchair)?: A Little Help needed standing up from a chair using your arms (e.g., wheelchair or bedside chair)?: A Little Help needed to walk in hospital room?: A Little Help needed climbing 3-5 steps with a railing? : A Little 6 Click Score: 18    End of Session Equipment Utilized During Treatment: Gait belt Activity Tolerance: Patient tolerated treatment well Patient left: in chair;with call bell/phone within reach;with chair alarm set Nurse Communication:  Mobility status Pain - Right/Left: Right Pain - part of body: Knee     Time: 1117-3567 PT Time Calculation (min) (ACUTE ONLY): 15 min  Charges:  $Gait Training: 8-22 mins                    Chesley Noon, PTA 04/13/19, 11:46 AM

## 2019-04-14 ENCOUNTER — Other Ambulatory Visit: Payer: Self-pay | Admitting: Family Medicine

## 2019-04-15 DIAGNOSIS — E1122 Type 2 diabetes mellitus with diabetic chronic kidney disease: Secondary | ICD-10-CM | POA: Diagnosis not present

## 2019-04-15 DIAGNOSIS — M48061 Spinal stenosis, lumbar region without neurogenic claudication: Secondary | ICD-10-CM | POA: Diagnosis not present

## 2019-04-15 DIAGNOSIS — E1142 Type 2 diabetes mellitus with diabetic polyneuropathy: Secondary | ICD-10-CM | POA: Diagnosis not present

## 2019-04-15 DIAGNOSIS — Z471 Aftercare following joint replacement surgery: Secondary | ICD-10-CM | POA: Diagnosis not present

## 2019-04-15 DIAGNOSIS — Z87442 Personal history of urinary calculi: Secondary | ICD-10-CM | POA: Diagnosis not present

## 2019-04-15 DIAGNOSIS — Z794 Long term (current) use of insulin: Secondary | ICD-10-CM | POA: Diagnosis not present

## 2019-04-15 DIAGNOSIS — N183 Chronic kidney disease, stage 3 (moderate): Secondary | ICD-10-CM | POA: Diagnosis not present

## 2019-04-15 DIAGNOSIS — Z96651 Presence of right artificial knee joint: Secondary | ICD-10-CM | POA: Diagnosis not present

## 2019-04-15 DIAGNOSIS — Z85828 Personal history of other malignant neoplasm of skin: Secondary | ICD-10-CM | POA: Diagnosis not present

## 2019-04-15 DIAGNOSIS — I129 Hypertensive chronic kidney disease with stage 1 through stage 4 chronic kidney disease, or unspecified chronic kidney disease: Secondary | ICD-10-CM | POA: Diagnosis not present

## 2019-04-15 DIAGNOSIS — M5106 Intervertebral disc disorders with myelopathy, lumbar region: Secondary | ICD-10-CM | POA: Diagnosis not present

## 2019-04-15 DIAGNOSIS — Z9181 History of falling: Secondary | ICD-10-CM | POA: Diagnosis not present

## 2019-04-15 DIAGNOSIS — Z7901 Long term (current) use of anticoagulants: Secondary | ICD-10-CM | POA: Diagnosis not present

## 2019-04-15 DIAGNOSIS — E7800 Pure hypercholesterolemia, unspecified: Secondary | ICD-10-CM | POA: Diagnosis not present

## 2019-04-18 ENCOUNTER — Other Ambulatory Visit: Payer: Self-pay | Admitting: Family Medicine

## 2019-04-18 NOTE — TOC Benefit Eligibility Note (Signed)
Transition of Care [facility]) Benefit Eligibility Note    Patient Details  Name: Christian [name] MRN: [id number] Date of Birth: April 12, 1935   Medication/Dose: Lovenox 40mg  once daily for 14 days  Covered?: No  Prescription Coverage Preferred Pharmacy: [facility] Ruder with Person/Company/Phone Number:: [place] Chard with [facility] at [phone]  Prior Approval: Yes(PA required for name brand: [phone])  Deductible: (In coverage gap - "donut hole")  Additional Notes: Generic Enoxaparin covered with no PA required.  Considered Tier 4.  Due to being in coverage gap, patient responsible for 25% of the medication cost.  Unable to provide estimated copay amount.    Dannette Barbara Phone Number: 04/18/2019, 2:45 PM

## 2019-04-18 NOTE — TOC Progression Note (Signed)
Transition of Care [facility]) - Progression Note    Patient Details  Name: EPIC TRIBBETT MRN: [id number] Date of Birth: [birth year]  Transition of Care [facility]) CM/SW [place], RN Phone Number: 04/18/2019, 9:37 AM  Clinical Narrative:    Requested the price of Lovenox, will notify the patient once obtained   Expected Discharge Plan: [place] Barriers to Discharge: Continued Medical Work up  Expected Discharge Plan and Services Expected Discharge Plan: [place]   Discharge Planning Services: CM Consult Post Acute Care Choice: [place] arrangements for the past 2 months: [place] Expected Discharge Date: 04/13/19               DME Arranged: Gilford Rile rolling DME Agency: [employer] Date DME Agency Contacted: 04/12/19 Time DME Agency Contacted: [phone] Representative spoke with at DME Agency: [place]: PT [place]: [facility] [employer] Date [place]: 04/12/19 Time [place]: [place] Representative spoke with at [place]: [place] ([place]) Interventions    Readmission Risk Interventions No flowsheet data found.

## 2019-04-21 DIAGNOSIS — N183 Chronic kidney disease, stage 3 (moderate): Secondary | ICD-10-CM | POA: Diagnosis not present

## 2019-04-21 DIAGNOSIS — E7800 Pure hypercholesterolemia, unspecified: Secondary | ICD-10-CM | POA: Diagnosis not present

## 2019-04-21 DIAGNOSIS — E1142 Type 2 diabetes mellitus with diabetic polyneuropathy: Secondary | ICD-10-CM | POA: Diagnosis not present

## 2019-04-21 DIAGNOSIS — Z7901 Long term (current) use of anticoagulants: Secondary | ICD-10-CM | POA: Diagnosis not present

## 2019-04-21 DIAGNOSIS — Z85828 Personal history of other malignant neoplasm of skin: Secondary | ICD-10-CM | POA: Diagnosis not present

## 2019-04-21 DIAGNOSIS — Z96651 Presence of right artificial knee joint: Secondary | ICD-10-CM | POA: Diagnosis not present

## 2019-04-21 DIAGNOSIS — Z9181 History of falling: Secondary | ICD-10-CM | POA: Diagnosis not present

## 2019-04-21 DIAGNOSIS — M48061 Spinal stenosis, lumbar region without neurogenic claudication: Secondary | ICD-10-CM | POA: Diagnosis not present

## 2019-04-21 DIAGNOSIS — Z794 Long term (current) use of insulin: Secondary | ICD-10-CM | POA: Diagnosis not present

## 2019-04-21 DIAGNOSIS — I129 Hypertensive chronic kidney disease with stage 1 through stage 4 chronic kidney disease, or unspecified chronic kidney disease: Secondary | ICD-10-CM | POA: Diagnosis not present

## 2019-04-21 DIAGNOSIS — E1122 Type 2 diabetes mellitus with diabetic chronic kidney disease: Secondary | ICD-10-CM | POA: Diagnosis not present

## 2019-04-21 DIAGNOSIS — Z87442 Personal history of urinary calculi: Secondary | ICD-10-CM | POA: Diagnosis not present

## 2019-04-21 DIAGNOSIS — Z471 Aftercare following joint replacement surgery: Secondary | ICD-10-CM | POA: Diagnosis not present

## 2019-04-21 DIAGNOSIS — M5106 Intervertebral disc disorders with myelopathy, lumbar region: Secondary | ICD-10-CM | POA: Diagnosis not present

## 2019-04-25 ENCOUNTER — Other Ambulatory Visit: Payer: Self-pay | Admitting: Family Medicine

## 2019-04-26 DIAGNOSIS — M25561 Pain in right knee: Secondary | ICD-10-CM | POA: Diagnosis not present

## 2019-04-26 DIAGNOSIS — Z96651 Presence of right artificial knee joint: Secondary | ICD-10-CM | POA: Diagnosis not present

## 2019-04-26 DIAGNOSIS — R29898 Other symptoms and signs involving the musculoskeletal system: Secondary | ICD-10-CM | POA: Diagnosis not present

## 2019-04-26 DIAGNOSIS — M25661 Stiffness of right knee, not elsewhere classified: Secondary | ICD-10-CM | POA: Diagnosis not present

## 2019-04-28 DIAGNOSIS — M25561 Pain in right knee: Secondary | ICD-10-CM | POA: Diagnosis not present

## 2019-04-28 DIAGNOSIS — Z96651 Presence of right artificial knee joint: Secondary | ICD-10-CM | POA: Diagnosis not present

## 2019-05-02 DIAGNOSIS — M25561 Pain in right knee: Secondary | ICD-10-CM | POA: Diagnosis not present

## 2019-05-02 DIAGNOSIS — Z96651 Presence of right artificial knee joint: Secondary | ICD-10-CM | POA: Diagnosis not present

## 2019-05-04 DIAGNOSIS — Z96651 Presence of right artificial knee joint: Secondary | ICD-10-CM | POA: Diagnosis not present

## 2019-05-06 DIAGNOSIS — Z96651 Presence of right artificial knee joint: Secondary | ICD-10-CM | POA: Diagnosis not present

## 2019-05-06 DIAGNOSIS — M25561 Pain in right knee: Secondary | ICD-10-CM | POA: Diagnosis not present

## 2019-05-09 DIAGNOSIS — Z96651 Presence of right artificial knee joint: Secondary | ICD-10-CM | POA: Diagnosis not present

## 2019-05-11 DIAGNOSIS — Z96651 Presence of right artificial knee joint: Secondary | ICD-10-CM | POA: Diagnosis not present

## 2019-05-11 DIAGNOSIS — M25561 Pain in right knee: Secondary | ICD-10-CM | POA: Diagnosis not present

## 2019-05-13 DIAGNOSIS — M25561 Pain in right knee: Secondary | ICD-10-CM | POA: Diagnosis not present

## 2019-05-13 DIAGNOSIS — Z96651 Presence of right artificial knee joint: Secondary | ICD-10-CM | POA: Diagnosis not present

## 2019-05-17 DIAGNOSIS — Z471 Aftercare following joint replacement surgery: Secondary | ICD-10-CM | POA: Diagnosis not present

## 2019-05-17 DIAGNOSIS — M1711 Unilateral primary osteoarthritis, right knee: Secondary | ICD-10-CM | POA: Diagnosis not present

## 2019-05-23 ENCOUNTER — Other Ambulatory Visit: Payer: Self-pay | Admitting: Family Medicine

## 2019-05-26 DIAGNOSIS — Z471 Aftercare following joint replacement surgery: Secondary | ICD-10-CM | POA: Diagnosis not present

## 2019-06-03 ENCOUNTER — Inpatient Hospital Stay (HOSPITAL_COMMUNITY)
Admission: EM | Admit: 2019-06-03 | Discharge: 2019-06-08 | DRG: 871 | Disposition: A | Discharge status: Home Health | Payer: PPO | Attending: Family Medicine | Admitting: Family Medicine

## 2019-06-03 ENCOUNTER — Emergency Department (HOSPITAL_COMMUNITY): Payer: PPO

## 2019-06-03 ENCOUNTER — Ambulatory Visit (INDEPENDENT_AMBULATORY_CARE_PROVIDER_SITE_OTHER): Payer: PPO | Admitting: Family Medicine

## 2019-06-03 ENCOUNTER — Other Ambulatory Visit: Payer: Self-pay

## 2019-06-03 VITALS — HR 114 | Temp 98.4°F

## 2019-06-03 DIAGNOSIS — A419 Sepsis, unspecified organism: Secondary | ICD-10-CM | POA: Diagnosis not present

## 2019-06-03 DIAGNOSIS — Z87442 Personal history of urinary calculi: Secondary | ICD-10-CM

## 2019-06-03 DIAGNOSIS — Z888 Allergy status to other drugs, medicaments and biological substances status: Secondary | ICD-10-CM | POA: Diagnosis not present

## 2019-06-03 DIAGNOSIS — R0602 Shortness of breath: Secondary | ICD-10-CM

## 2019-06-03 DIAGNOSIS — Z7901 Long term (current) use of anticoagulants: Secondary | ICD-10-CM | POA: Diagnosis not present

## 2019-06-03 DIAGNOSIS — J180 Bronchopneumonia, unspecified organism: Secondary | ICD-10-CM | POA: Diagnosis present

## 2019-06-03 DIAGNOSIS — R6510 Systemic inflammatory response syndrome (SIRS) of non-infectious origin without acute organ dysfunction: Secondary | ICD-10-CM

## 2019-06-03 DIAGNOSIS — N183 Chronic kidney disease, stage 3 unspecified: Secondary | ICD-10-CM | POA: Diagnosis present

## 2019-06-03 DIAGNOSIS — R0609 Other forms of dyspnea: Secondary | ICD-10-CM | POA: Diagnosis not present

## 2019-06-03 DIAGNOSIS — Z20828 Contact with and (suspected) exposure to other viral communicable diseases: Secondary | ICD-10-CM | POA: Diagnosis present

## 2019-06-03 DIAGNOSIS — E785 Hyperlipidemia, unspecified: Secondary | ICD-10-CM | POA: Diagnosis present

## 2019-06-03 DIAGNOSIS — R6520 Severe sepsis without septic shock: Secondary | ICD-10-CM | POA: Diagnosis not present

## 2019-06-03 DIAGNOSIS — J189 Pneumonia, unspecified organism: Secondary | ICD-10-CM | POA: Diagnosis not present

## 2019-06-03 DIAGNOSIS — Z85828 Personal history of other malignant neoplasm of skin: Secondary | ICD-10-CM

## 2019-06-03 DIAGNOSIS — I5031 Acute diastolic (congestive) heart failure: Secondary | ICD-10-CM | POA: Diagnosis not present

## 2019-06-03 DIAGNOSIS — Z79891 Long term (current) use of opiate analgesic: Secondary | ICD-10-CM

## 2019-06-03 DIAGNOSIS — D631 Anemia in chronic kidney disease: Secondary | ICD-10-CM | POA: Diagnosis not present

## 2019-06-03 DIAGNOSIS — Z713 Dietary counseling and surveillance: Secondary | ICD-10-CM

## 2019-06-03 DIAGNOSIS — J9601 Acute respiratory failure with hypoxia: Secondary | ICD-10-CM | POA: Diagnosis present

## 2019-06-03 DIAGNOSIS — I130 Hypertensive heart and chronic kidney disease with heart failure and stage 1 through stage 4 chronic kidney disease, or unspecified chronic kidney disease: Secondary | ICD-10-CM | POA: Diagnosis not present

## 2019-06-03 DIAGNOSIS — Z96651 Presence of right artificial knee joint: Secondary | ICD-10-CM | POA: Diagnosis present

## 2019-06-03 DIAGNOSIS — I10 Essential (primary) hypertension: Secondary | ICD-10-CM | POA: Diagnosis not present

## 2019-06-03 DIAGNOSIS — Z6832 Body mass index (BMI) 32.0-32.9, adult: Secondary | ICD-10-CM | POA: Diagnosis not present

## 2019-06-03 DIAGNOSIS — I5033 Acute on chronic diastolic (congestive) heart failure: Secondary | ICD-10-CM | POA: Diagnosis present

## 2019-06-03 DIAGNOSIS — E1122 Type 2 diabetes mellitus with diabetic chronic kidney disease: Secondary | ICD-10-CM | POA: Diagnosis present

## 2019-06-03 DIAGNOSIS — E669 Obesity, unspecified: Secondary | ICD-10-CM | POA: Diagnosis not present

## 2019-06-03 DIAGNOSIS — Z87891 Personal history of nicotine dependence: Secondary | ICD-10-CM | POA: Diagnosis not present

## 2019-06-03 DIAGNOSIS — Z79899 Other long term (current) drug therapy: Secondary | ICD-10-CM

## 2019-06-03 DIAGNOSIS — N179 Acute kidney failure, unspecified: Secondary | ICD-10-CM | POA: Diagnosis not present

## 2019-06-03 DIAGNOSIS — R05 Cough: Secondary | ICD-10-CM | POA: Diagnosis not present

## 2019-06-03 DIAGNOSIS — E7800 Pure hypercholesterolemia, unspecified: Secondary | ICD-10-CM | POA: Diagnosis not present

## 2019-06-03 DIAGNOSIS — B9562 Methicillin resistant Staphylococcus aureus infection as the cause of diseases classified elsewhere: Secondary | ICD-10-CM | POA: Diagnosis present

## 2019-06-03 DIAGNOSIS — R000 Tachycardia, unspecified: Secondary | ICD-10-CM | POA: Diagnosis not present

## 2019-06-03 DIAGNOSIS — Z794 Long term (current) use of insulin: Secondary | ICD-10-CM

## 2019-06-03 DIAGNOSIS — E118 Type 2 diabetes mellitus with unspecified complications: Secondary | ICD-10-CM | POA: Diagnosis not present

## 2019-06-03 DIAGNOSIS — E1140 Type 2 diabetes mellitus with diabetic neuropathy, unspecified: Secondary | ICD-10-CM | POA: Diagnosis present

## 2019-06-03 LAB — CBC
HCT: 31.4 % — ABNORMAL LOW (ref 39.0–52.0)
Hemoglobin: 10 g/dL — ABNORMAL LOW (ref 13.0–17.0)
MCH: 28.9 pg (ref 26.0–34.0)
MCHC: 31.8 g/dL (ref 30.0–36.0)
MCV: 90.8 fL (ref 80.0–100.0)
Platelets: 234 10*3/uL (ref 150–400)
RBC: 3.46 MIL/uL — ABNORMAL LOW (ref 4.22–5.81)
RDW: 14.8 % (ref 11.5–15.5)
WBC: 18.5 10*3/uL — ABNORMAL HIGH (ref 4.0–10.5)
nRBC: 0 % (ref 0.0–0.2)

## 2019-06-03 LAB — BASIC METABOLIC PANEL
Anion gap: 15 (ref 5–15)
BUN: 32 mg/dL — ABNORMAL HIGH (ref 8–23)
CO2: 21 mmol/L — ABNORMAL LOW (ref 22–32)
Calcium: 8.8 mg/dL — ABNORMAL LOW (ref 8.9–10.3)
Chloride: 101 mmol/L (ref 98–111)
Creatinine, Ser: 2.03 mg/dL — ABNORMAL HIGH (ref 0.61–1.24)
GFR calc Af Amer: 34 mL/min — ABNORMAL LOW (ref >60)
GFR calc non Af Amer: 30 mL/min — ABNORMAL LOW (ref >60)
Glucose, Bld: 188 mg/dL — ABNORMAL HIGH (ref 70–99)
Potassium: 4.5 mmol/L (ref 3.5–5.1)
Sodium: 137 mmol/L (ref 135–145)

## 2019-06-03 LAB — BRAIN NATRIURETIC PEPTIDE: B Natriuretic Peptide: 91.3 pg/mL (ref 0.0–100.0)

## 2019-06-03 LAB — LACTIC ACID, PLASMA: Lactic Acid, Venous: 2.2 mmol/L (ref 0.5–1.9)

## 2019-06-03 LAB — SARS CORONAVIRUS 2 BY RT PCR (HOSPITAL ORDER, PERFORMED IN ~~LOC~~ HOSPITAL LAB): SARS Coronavirus 2: NEGATIVE

## 2019-06-03 MED ORDER — SODIUM CHLORIDE 0.9 % IV SOLN
500.0000 mg | Freq: Once | INTRAVENOUS | Status: AC
  Administered 2019-06-03: 500 mg via INTRAVENOUS
  Filled 2019-06-03: qty 500

## 2019-06-03 MED ORDER — SODIUM CHLORIDE 0.9 % IV SOLN
1.0000 g | Freq: Once | INTRAVENOUS | Status: AC
  Administered 2019-06-03: 1 g via INTRAVENOUS
  Filled 2019-06-03: qty 10

## 2019-06-03 MED ORDER — ACETAMINOPHEN 325 MG PO TABS
650.0000 mg | ORAL_TABLET | Freq: Once | ORAL | Status: AC
  Administered 2019-06-03: 650 mg via ORAL
  Filled 2019-06-03: qty 2

## 2019-06-03 NOTE — ED Notes (Signed)
Pt o2 sats in the 80's on RA pt placed on 2L of O2. o2 at 100%

## 2019-06-03 NOTE — ED Notes (Signed)
ED TO INPATIENT HANDOFF REPORT  ED Nurse Name and Phone #: [phone]  S Name/Age/Gender Christian [name] 84 y.o. male Room/Bed: 019C/019C  Code Status   Code Status: Prior  Home/SNF/Other Home Patient oriented to: self, place, time and situation Is this baseline? Yes   Triage Complete: Triage complete  Chief Complaint Pneumonia  Triage Note Pt having shortness of breath and cough for about 3 days. Pt said no chest pain no fevers no chills. Pt said he feels like there is fluid in his lungs    Allergies Allergies  Allergen Reactions  . Actos [Pioglitazone] Other (See Comments)    LE Edema--03/2014    Level of Care/Admitting Diagnosis ED Disposition    ED Disposition Condition Comment   Admit  The patient appears reasonably stabilized for admission considering the current resources, flow, and capabilities available in the ED at this time, and I doubt any other [facility] requiring further screening and/or treatment in the ED prior to admission is  present.       B Medical/Surgery History Past Medical History:  Diagnosis Date  . Arthritis   . Back pain   . Diabetes mellitus    Type 2  . Diabetic neuropathy ([place])   . History of kidney stones   . HTN (hypertension) 03/03/2013  . Hypercholesterolemia   . Leg cramps   . Lower extremity edema   . 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 Skin cancer of arm 04/20/2013   left upper arm  . Spinal stenosis 05/17/2014   L3-L4   . Urgency of urination    Past Surgical History:  Procedure Laterality Date  . APPENDECTOMY     age 60  . KNEE ARTHROPLASTY Right 04/11/2019   Procedure: COMPUTER ASSISTED TOTAL KNEE ARTHROPLASTY;  Surgeon: Dereck Leep, MD;  Location: [facility] ORS;  Service: Orthopedics;  Laterality: Right;  . LUMBAR LAMINECTOMY/DECOMPRESSION MICRODISCECTOMY Bilateral 11/22/2014   Procedure: Laminectomy and foraminotomy lumbar three-lumbar four bilateral ;  Surgeon: Eustace Moore, MD;  Location: [facility] ORS;  Service:  Neurosurgery;  Laterality: Bilateral;  . SKIN CANCER EXCISION Left    shoulder and hand     A IV Location/Drains/Wounds Patient Lines/Drains/Airways Status   Active Line/Drains/Airways    Name:   Placement date:   Placement time:   Site:   Days:   Peripheral IV 02/18/18 Left Wrist   02/18/18    1606    Wrist   470   Peripheral IV 06/03/19 Left Forearm   06/03/19    2130    Forearm   less than 1   Incision (Closed) 11/22/14 Back   11/22/14    1401     1654   Incision (Closed) 04/11/19 Knee Right   04/11/19    1014     53   Wound / Incision (Open or Dehisced) 04/11/19 Other (Comment) Buttocks Right Abrasion   04/11/19    1229    Buttocks   53          Intake/Output Last 24 hours No intake or output data in the 24 hours ending 06/03/19 2250  Labs/Imaging Results for orders placed or performed during the hospital encounter of 06/03/19 (from the past 48 hour(s))  Basic metabolic panel     Status: Abnormal   Collection Time: 06/03/19  5:25 PM  Result Value Ref Range   Sodium 137 135 - 145 mmol/L   Potassium 4.5 3.5 - 5.1 mmol/L   Chloride 101 98 - 111 mmol/L  CO2 21 (L) 22 - 32 mmol/L   Glucose, Bld 188 (H) 70 - 99 mg/dL   BUN 32 (H) 8 - 23 mg/dL   Creatinine, Ser 2.03 (H) 0.61 - 1.24 mg/dL   Calcium 8.8 (L) 8.9 - 10.3 mg/dL   GFR calc non Af Amer 30 (L) >60 mL/min   GFR calc Af Amer 34 (L) >60 mL/min   Anion gap 15 5 - 15    Comment: Performed at [facility] [address], [place], [place] [address]  CBC     Status: Abnormal   Collection Time: 06/03/19  5:25 PM  Result Value Ref Range   WBC 18.5 (H) 4.0 - 10.5 K/uL   RBC 3.46 (L) 4.22 - 5.81 MIL/uL   Hemoglobin 10.0 (L) 13.0 - 17.0 g/dL   HCT 31.4 (L) 39.0 - 52.0 %   MCV 90.8 80.0 - 100.0 fL   MCH 28.9 26.0 - 34.0 pg   MCHC 31.8 30.0 - 36.0 g/dL   RDW 14.8 11.5 - 15.5 %   Platelets 234 150 - 400 K/uL   nRBC 0.0 0.0 - 0.2 %    Comment: Performed at [facility], [place] [address], [place], [place] [address]   Brain natriuretic peptide     Status: None   Collection Time: 06/03/19  5:26 PM  Result Value Ref Range   B Natriuretic Peptide 91.3 0.0 - 100.0 pg/mL    Comment: Performed at [facility] [address] [place], [place] [address]  Lactic acid, plasma     Status: Abnormal   Collection Time: 06/03/19  9:44 PM  Result Value Ref Range   Lactic Acid, Venous 2.2 (HH) 0.5 - 1.9 mmol/L    Comment: CRITICAL RESULT CALLED TO, READ BACK BY AND VERIFIED WITH: Vallorie Niccoli P,RN 06/03/19 2221 WAYK Performed at [facility], [place] [address] [place], [place] [address]    Dg Chest 2 View  Result Date: 06/03/2019 CLINICAL DATA:  Shortness of breath EXAM: CHEST - 2 VIEW COMPARISON:  02/17/2018 FINDINGS: Cardiomegaly, vascular congestion. Bilateral perihilar and lower lobe opacities could reflect edema or infection. No visible significant effusions or acute bony abnormality. IMPRESSION: Cardiomegaly, vascular congestion. Bilateral perihilar and lower lobe opacities, favor edema although infection is not excluded. Electronically Signed   By: Rolm Baptise M.D.   On: 06/03/2019 18:21    Pending Labs Unresulted Labs (From admission, onward)    Start     Ordered   06/03/19 2131  Lactic acid, plasma  Now then every 2 hours,   STAT     06/03/19 2130   06/03/19 2131  Blood Culture (routine x 2)  BLOOD CULTURE X 2,   STAT     06/03/19 2130   06/03/19 2042  SARS Coronavirus 2 [facility] order, Performed in [facility] hospital lab) Nasopharyngeal Nasopharyngeal Swab  (Symptomatic/High Risk of Exposure/Tier 1 Patients Labs with Precautions)  Once,   STAT    Question Answer Comment  Is this test for diagnosis or screening Diagnosis of ill patient   Symptomatic for COVID-19 as defined by CDC Yes   Date of Symptom Onset 05/31/2019   Hospitalized for COVID-19 Unknown   Admitted to ICU for COVID-19 Unknown   Previously tested for COVID-19 Unknown   Resident in a congregate (group) care setting No    Employed in healthcare setting No      06/03/19 2041          Vitals/Pain  Today's Vitals   06/03/19 2145 06/03/19 2215 06/03/19 2230 06/03/19 2245  BP: (!) 109/93  133/84   Pulse: (!) 111   (!) 111  Resp: (!) 22 17    Temp:      TempSrc:      SpO2: 95%   96%  PainSc:        Isolation Precautions Airborne and Contact precautions  Medications Medications  azithromycin (ZITHROMAX) 500 mg in sodium chloride 0.9 % 250 mL IVPB (500 mg Intravenous New Bag/Given 06/03/19 2153)  cefTRIAXone (ROCEPHIN) 1 g in sodium chloride 0.9 % 100 mL IVPB (1 g Intravenous New Bag/Given 06/03/19 2148)  acetaminophen (TYLENOL) tablet 650 mg (650 mg Oral Given 06/03/19 2245)    Mobility walks Low fall risk   Focused Assessments Pulmonary Assessment Handoff:  Lung sounds: Bilateral Breath Sounds: Diminished, Fine crackles L Breath Sounds: Diminished, Fine crackles R Breath Sounds: Diminished, Fine crackles O2 Device: Room Air       R Recommendations: See Admitting Provider Note  Report given to:   Additional Notes:

## 2019-06-03 NOTE — ED Provider Notes (Signed)
[place] EMERGENCY DEPARTMENT Provider Note   CSN: [id number] Arrival date & time: 06/03/19  1638     History   Chief Complaint Chief Complaint  Patient presents with  . Shortness of Breath    HPI Christian [name] is a 84 y.o. male presenting for evaluation of shortness of breath.  Patient states the past week, he has had worsening shortness of breath.  He told the triage nurse this is been going on for 3 days.  Patient reports associated cough.  He states he has been feeling very cold, but no known temperature.  He denies sore throat, chest pain, nausea, vomiting, domino pain, urinary symptoms, abnormal bowel movements.  Patient states his legs have been more swollen recently.  He saw his primary care doctor today, who is concerned about possible pneumonia versus CHF exacerbation.  Additional history obtained from chart review.  Patient's PCP noted tachypnea, rales, and edema.  History of diabetes, hypertension, lower extremity edema, CKD stage III. No dcumented h/o chf, but pt state he takes fluid pills.      HPI  Past Medical History:  Diagnosis Date  . Arthritis   . Back pain   . Diabetes mellitus    Type 2  . Diabetic neuropathy ([place])   . History of kidney stones   . HTN (hypertension) 03/03/2013  . Hypercholesterolemia   . Leg cramps   . Lower extremity edema   . 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 Skin cancer of arm 04/20/2013   left upper arm  . Spinal stenosis 05/17/2014   L3-L4   . Urgency of urination     Patient Active Problem List   Diagnosis Date Noted  . Total knee replacement status 04/11/2019  . Diabetes mellitus type 2 with complications ([place]) 123XX123  . CKD (chronic kidney disease) stage 3, GFR 30-59 ml/min (HCC) 09/02/2017  . Primary osteoarthritis of right knee 06/22/2017  . S/P lumbar laminectomy 11/22/2014  . 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 Bilateral lower extremity edema  06/14/2014  . Low back pain 03/13/2014  . Bilateral pseudophakia 06/27/2013  . Diabetic neuropathy ([place])   . Skin cancer of arm 04/20/2013  . HTN (hypertension) 03/03/2013  . Hypercholesterolemia     Past Surgical History:  Procedure Laterality Date  . APPENDECTOMY     age 62  . KNEE ARTHROPLASTY Right 04/11/2019   Procedure: COMPUTER ASSISTED TOTAL KNEE ARTHROPLASTY;  Surgeon: Dereck Leep, MD;  Location: [facility] ORS;  Service: Orthopedics;  Laterality: Right;  . LUMBAR LAMINECTOMY/DECOMPRESSION MICRODISCECTOMY Bilateral 11/22/2014   Procedure: Laminectomy and foraminotomy lumbar three-lumbar four bilateral ;  Surgeon: Eustace Moore, MD;  Location: [place] [place] [facility] OR[facility];  Service: Neurosurgery;  Laterality: Bilateral;  . SKIN CANCER EXCISION Left    shoulder and hand        Home Medications    Prior to Admission medications   Medication Sig Start Date End Date Taking? Authorizing Provider  acetaminophen (TYLENOL) 500 MG tablet Take 1,000 mg by mouth every 8 (eight) hours as needed for mild pain or moderate pain (back pain).    [provider]  Cholecalciferol (VITAMIN D) 125 MCG (5000 UT) CAPS Take 5,000 Units by mouth daily.     [provider]  Cyanocobalamin (B-12 PO) Take 5,000 mcg by mouth daily.     [provider]  enoxaparin (LOVENOX) 40 MG/0.4ML injection Inject 0.4 mLs (40 mg total) into the skin daily. 04/13/19   Damaris Hippo  [place], PA-C  fish oil-omega-3 fatty acids 1000 MG capsule Take 1 g by mouth 2 (two) times daily.     [provider]  furosemide (LASIX) 20 MG tablet Take 20 mg by mouth daily.    [provider]  gabapentin (NEURONTIN) 600 MG tablet TAKE 1 TABLET BY MOUTH THREE TIMES DAILY 05/24/19   Susy Frizzle, MD  Homeopathic Products (LEG CRAMPS PO) Take 2 tablets by mouth as needed.    [provider]  Insulin Pen Needle (BD PEN NEEDLE MICRO U/F) 32G X 6 MM MISC Use with insulin pen 05/14/18   Dixon, Mary B, PA-C   LANTUS SOLOSTAR 100 UNIT/ML Solostar Pen INJECT 64 UNITS INTO THE SKIN DAILY Patient taking differently: Inject 60 Units into the skin daily.  01/27/19   Susy Frizzle, MD  lisinopril (PRINIVIL,ZESTRIL) 5 MG tablet Take 1 tablet (5 mg total) by mouth daily. 11/12/18   Susy Frizzle, MD  metFORMIN (GLUCOPHAGE) 850 MG tablet TAKE 1 TABLET BY MOUTH TWICE DAILY WITH A MEAL 05/24/19   Susy Frizzle, MD  niacin (NIASPAN) 500 MG CR tablet TAKE 1 TABLET BY MOUTH AT BEDTIME Patient taking differently: Take 500 mg by mouth at bedtime.  02/17/19   Susy Frizzle, MD  Omega-3 Fatty Acids (FISH OIL) 1000 MG CAPS Take by mouth.    [provider]  ONE TOUCH ULTRA TEST test strip USE TO CHECK FASTING BLOOD SUGARS THREE TIMES DAILY 05/07/18   Dena Billet B, PA-C  oxyCODONE (OXY IR/ROXICODONE) 5 MG immediate release tablet Take 1-2 tablets (5-10 mg total) by mouth every 4 (four) hours as needed for moderate pain. 04/13/19   Lattie Corns, PA-C  simvastatin (ZOCOR) 40 MG tablet Take 1 tablet (40 mg total) by mouth daily. 04/25/19   Susy Frizzle, MD  traMADol (ULTRAM) 50 MG tablet Take 1 tablet (50 mg total) by mouth every 6 (six) hours as needed for moderate pain. 04/13/19   Lattie Corns, PA-C    Family History No family history on file.  Social History Social History   Tobacco Use  . Smoking status: Former Smoker    Packs/day: 1.00    Types: Cigarettes    Quit date: 03/04/2003    Years since quitting: 16.2  . Smokeless tobacco: Never Used  . Tobacco comment: smoked cigars also  Substance Use Topics  . Alcohol use: No  . Drug use: No     Allergies   Actos [pioglitazone]   Review of Systems Review of Systems  Constitutional: Positive for fever (Subjective).  Respiratory: Positive for cough and shortness of breath.   Cardiovascular: Positive for leg swelling.  All other systems reviewed and are negative.    Physical Exam Updated Vital Signs BP (!) 109/93    Pulse (!) 111   Temp (!) 103.6 F (39.8 C) (Rectal)   Resp (!) 22   SpO2 95%   Physical Exam Vitals signs and nursing note reviewed.  Constitutional:      Appearance: He is well-developed. He is ill-appearing.     Comments: Elderly male who is coughing every couple words.  Appears ill  HENT:     Head: Normocephalic and atraumatic.  Eyes:     Extraocular Movements: Extraocular movements intact.     Conjunctiva/sclera: Conjunctivae normal.     Pupils: Pupils are equal, round, and reactive to light.  Neck:     Musculoskeletal: Normal range of motion and neck supple.  Cardiovascular:  Rate and Rhythm: Regular rhythm. Tachycardia present.     Pulses: Normal pulses.     Comments: HR low 100's.  Pulmonary:     Effort: Pulmonary effort is normal. Tachypnea present.     Breath sounds: Wheezing and rales present.     Comments: Rales heard bilaterally, worse in R lobe. Tachypneic around 25. spo2 with poor wave form due to tremors, initially 93% on RA.  Abdominal:     General: There is no distension.     Palpations: Abdomen is soft. There is no mass.     Tenderness: There is no abdominal tenderness. There is no guarding or rebound.  Musculoskeletal: Normal range of motion.     Right lower leg: Edema present.     Left lower leg: Edema present.     Comments: bilateral 1+ pitting edema Upper extremity tremors, pt states chronic  Skin:    General: Skin is warm and dry.     Capillary Refill: Capillary refill takes less than 2 seconds.  Neurological:     Mental Status: He is alert and oriented to person, place, and time.      ED Treatments / Results  Labs (all labs ordered are listed, but only abnormal results are displayed) Labs Reviewed  BASIC METABOLIC PANEL - Abnormal; Notable for the following components:      Result Value   CO2 21 (*)    Glucose, Bld 188 (*)    BUN 32 (*)    Creatinine, Ser 2.03 (*)    Calcium 8.8 (*)    GFR calc non Af Amer 30 (*)    GFR calc Af Amer  34 (*)    All other components within normal limits  CBC - Abnormal; Notable for the following components:   WBC 18.5 (*)    RBC 3.46 (*)    Hemoglobin 10.0 (*)    HCT 31.4 (*)    All other components within normal limits  LACTIC ACID, PLASMA - Abnormal; Notable for the following components:   Lactic Acid, Venous 2.2 (*)    All other components within normal limits  SARS CORONAVIRUS 2 (HOSPITAL ORDER, [facility])  CULTURE, BLOOD (ROUTINE X 2)  CULTURE, BLOOD (ROUTINE X 2)  BRAIN NATRIURETIC PEPTIDE  LACTIC ACID, PLASMA    EKG EKG Interpretation  Date/Time:  Friday June 03 2019 21:07:38 EDT Ventricular Rate:  115 PR Interval:  168 QRS Duration: 71 QT Interval:  344 QTC Calculation: 476 R Axis:   5 Text Interpretation:  Sinus tachycardia Non-specific ST-t changes Confirmed by Lajean Saver [phone]) on 06/03/2019 9:30:22 PM   Radiology Dg Chest 2 View  Result Date: 06/03/2019 CLINICAL DATA:  Shortness of breath EXAM: CHEST - 2 VIEW COMPARISON:  02/17/2018 FINDINGS: Cardiomegaly, vascular congestion. Bilateral perihilar and lower lobe opacities could reflect edema or infection. No visible significant effusions or acute bony abnormality. IMPRESSION: Cardiomegaly, vascular congestion. Bilateral perihilar and lower lobe opacities, favor edema although infection is not excluded. Electronically Signed   By: Rolm Baptise M.D.   On: 06/03/2019 18:21    Procedures Procedures (including critical care time)  Medications Ordered in ED Medications  azithromycin (ZITHROMAX) 500 mg in sodium chloride 0.9 % 250 mL IVPB (500 mg Intravenous New Bag/Given 06/03/19 2153)  acetaminophen (TYLENOL) tablet 650 mg (has no administration in time range)  cefTRIAXone (ROCEPHIN) 1 g in sodium chloride 0.9 % 100 mL IVPB (1 g Intravenous New Bag/Given 06/03/19 2148)     Initial  Impression / Assessment and Plan / ED Course  I have reviewed the triage vital signs and the nursing  notes.  Pertinent labs & imaging results that were available during my care of the patient were reviewed by me and considered in my medical decision making (see chart for details).        Patient presenting for evaluation of shortness of breath and cough.  Physical exam concerning in that patient is tachypneic and coughing every few words.  Pulmonary exam with wheezing and rales.  Sats are difficult to obtain due to tremors, which patient states is baseline.  However, patient feels warm to the touch and I am concerned he has a fever and this may be rigors.  X-ray obtained from triage reviewed by me.  Shows bilateral lower infiltrates.  Per radiology, this could be edema or infection.  In the setting of new onset cough and shortness of breath and a white count of 18, likely pneumonia.  BNP is negative.  Patient also has a mild AKI, creatinine went from 1.5 to 2.  Will obtain rectal temperature, start antibiotics.  Pneumonia, order COVID, admit to hospitalist service.  Informed by RN that temperature is 103.6.  Patient is tachycardic to the 110s.  As he now meet SIRS criteria, lactic and blood cultures ordered.   Discussed with Dr. Hal Hope from triad hospital service, patient to be admitted.  Patient sats dropped into the 80s on room air, started on 2 L of oxygen.  Final Clinical Impressions(s) / ED Diagnoses   Final diagnoses:  Community acquired pneumonia, unspecified laterality  SIRS (systemic inflammatory response syndrome) ([place])  AKI (acute kidney injury) [facility])    ED Discharge Orders    None       Franchot Heidelberg, PA-C 06/03/19 2247    Lajean Saver, MD 06/04/19 [phone]

## 2019-06-03 NOTE — ED Notes (Signed)
RN and tech drawing labs.

## 2019-06-03 NOTE — ED Triage Notes (Signed)
Pt having shortness of breath and cough for about 3 days. Pt said no chest pain no fevers no chills. Pt said he feels like there is fluid in his lungs

## 2019-06-03 NOTE — Progress Notes (Signed)
Subjective:    Patient ID: Christian [name], male    DOB: 06/07/1935, 84 y.o.   MRN: [id number]  HPI  Patient is an 84 year old gentleman who walks in emergently this afternoon complaining of shortness of breath.  Earlier this month he underwent knee replacement.  He is approximately 1 month out from his knee replacement.  He states that over the last week he has developed a cough that has been gradually worsening.  Today he developed shortness of breath.  His friend brought him to the office.  Here today he is tachycardic at 114 bpm.  Pulse oximetry is 93% on room air.  EKG was obtained which does show sinus tachycardia.  He has mild ST depression in lead I and lead aVL.  Otherwise there are no acute changes.  His weight is down 16 pounds from his last office visit.  However he has pitting edema in both legs and pronounced crackles in the mid and lower right lung as well as fine crackles in the lower left lung.  This is beyond the patient's baseline.  Patient denies any chest pain.  However he does report dyspnea on exertion.  He is slightly tachypneic here today on room air even at rest.  He denies any pleurisy.  He denies any hemoptysis.  He denies any purulent sputum Past Medical History:  Diagnosis Date  . Arthritis   . Back pain   . Diabetes mellitus    Type 2  . Diabetic neuropathy ([place])   . History of kidney stones   . HTN (hypertension) 03/03/2013  . Hypercholesterolemia   . Leg cramps   . Lower extremity edema   . 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 Skin cancer of arm 04/20/2013   left upper arm  . Spinal stenosis 05/17/2014   L3-L4   . Urgency of urination    Past Surgical History:  Procedure Laterality Date  . APPENDECTOMY     age 76  . KNEE ARTHROPLASTY Right 04/11/2019   Procedure: COMPUTER ASSISTED TOTAL KNEE ARTHROPLASTY;  Surgeon: Dereck Leep, MD;  Location: [facility] ORS;  Service: Orthopedics;  Laterality: Right;  . LUMBAR LAMINECTOMY/DECOMPRESSION MICRODISCECTOMY  Bilateral 11/22/2014   Procedure: Laminectomy and foraminotomy lumbar three-lumbar four bilateral ;  Surgeon: Eustace Moore, MD;  Location: [facility] OR[facility];  Service: Neurosurgery;  Laterality: Bilateral;  . SKIN CANCER EXCISION Left    shoulder and hand   Current Outpatient Medications on File Prior to Visit  Medication Sig Dispense Refill  . acetaminophen (TYLENOL) 500 MG tablet Take 1,000 mg by mouth every 8 (eight) hours as needed for mild pain or moderate pain (back pain).    . Cholecalciferol (VITAMIN D) 125 MCG (5000 UT) CAPS Take 5,000 Units by mouth daily.     . Cyanocobalamin (B-12 PO) Take 5,000 mcg by mouth daily.     Marland Kitchen enoxaparin (LOVENOX) 40 MG/0.4ML injection Inject 0.4 mLs (40 mg total) into the skin daily. 5.6 mL 0  . fish oil-omega-3 fatty acids 1000 MG capsule Take 1 g by mouth 2 (two) times daily.     . furosemide (LASIX) 20 MG tablet Take 20 mg by mouth daily.    Marland Kitchen gabapentin (NEURONTIN) 600 MG tablet TAKE 1 TABLET BY MOUTH THREE TIMES DAILY 90 tablet 0  . Homeopathic Products (LEG CRAMPS PO) Take 2 tablets by mouth as needed.    . Insulin Pen Needle (BD PEN NEEDLE MICRO U/F) 32G X 6 MM MISC Use with insulin pen 100  each 3  . LANTUS SOLOSTAR 100 UNIT/ML Solostar Pen INJECT 64 UNITS INTO THE SKIN DAILY (Patient taking differently: Inject 60 Units into the skin daily. ) 15 mL 2  . lisinopril (PRINIVIL,ZESTRIL) 5 MG tablet Take 1 tablet (5 mg total) by mouth daily. 90 tablet 3  . metFORMIN (GLUCOPHAGE) 850 MG tablet TAKE 1 TABLET BY MOUTH TWICE DAILY WITH A MEAL 60 tablet 0  . niacin (NIASPAN) 500 MG CR tablet TAKE 1 TABLET BY MOUTH AT BEDTIME (Patient taking differently: Take 500 mg by mouth at bedtime. ) 90 tablet 0  . Omega-3 Fatty Acids (FISH OIL) 1000 MG CAPS Take by mouth.    . ONE TOUCH ULTRA TEST test strip USE TO CHECK FASTING BLOOD SUGARS THREE TIMES DAILY 100 each 11  . oxyCODONE (OXY IR/ROXICODONE) 5 MG immediate release tablet Take 1-2 tablets (5-10 mg total) by mouth  every 4 (four) hours as needed for moderate pain. 60 tablet 0  . simvastatin (ZOCOR) 40 MG tablet Take 1 tablet (40 mg total) by mouth daily. 90 tablet 1  . traMADol (ULTRAM) 50 MG tablet Take 1 tablet (50 mg total) by mouth every 6 (six) hours as needed for moderate pain. 40 tablet 0   No current facility-administered medications on file prior to visit.    Allergies  Allergen Reactions  . Actos [Pioglitazone]     LE Edema--03/2014   Social History   Socioeconomic History  . Marital status: Divorced    Spouse name: Not on file  . Number of children: 2  . Years of education: Not on file  . Highest education level: Not on file  Occupational History  . Not on file  Social Needs  . Financial resource strain: Not on file  . Food insecurity    Worry: Not on file    Inability: Not on file  . Transportation needs    Medical: Not on file    Non-medical: Not on file  Tobacco Use  . Smoking status: Former Smoker    Packs/day: 1.00    Types: Cigarettes    Quit date: 03/04/2003    Years since quitting: 16.2  . Smokeless tobacco: Never Used  . Tobacco comment: smoked cigars also  Substance and Sexual Activity  . Alcohol use: No  . Drug use: No  . Sexual activity: Not on file  Lifestyle  . Physical activity    Days per week: Not on file    Minutes per session: Not on file  . Stress: Not on file  Relationships  . Social Herbalist on phone: Not on file    Gets together: Not on file    Attends religious service: Not on file    Active member of club or organization: Not on file    Attends meetings of clubs or organizations: Not on file    Relationship status: Not on file  . Intimate partner violence    Fear of current or ex partner: Not on file    Emotionally abused: Not on file    Physically abused: Not on file    Forced sexual activity: Not on file  Other Topics Concern  . Not on file  Social History Narrative   [name] will be with patient after surgery      Review of Systems  All other systems reviewed and are negative.      Objective:   Physical Exam Vitals signs reviewed.  Constitutional:      Appearance: He  is ill-appearing.  Cardiovascular:     Rate and Rhythm: Regular rhythm. Tachycardia present.     Heart sounds: Normal heart sounds. No murmur. No friction rub. No gallop.   Pulmonary:     Effort: Tachypnea present.     Breath sounds: Decreased air movement present. Examination of the right-middle field reveals rales. Examination of the right-lower field reveals rales. Examination of the left-lower field reveals rales. Decreased breath sounds and rales present. No wheezing.  Musculoskeletal:     Right lower leg: Edema present.     Left lower leg: Edema present.  Neurological:     Mental Status: He is alert.           Assessment & Plan:  SOB (shortness of breath) - Plan: EKG 12-Lead  Patient has pronounced right-sided crackles and basilar left crackles.  This is concerning for pulmonary edema.  He also has pitting edema in his legs.  He is tachycardic with ST segment depression in the lateral leads which is mild which I believe reflects strain secondary to the tachycardia.  Differential diagnosis includes pulmonary edema versus community-acquired pneumonia.  I believe the patient requires ER evaluation for chest x-ray to differentiate the source of the crackles, BNP, as well as a troponin to evaluate for underlying cardiac damage.  Patient will likely need IV diuresis versus IV antibiotics depending upon the above work-up.  I recommended EMS transport however the patient refused.  His nephew will carry him directly in the private vehicle.  The patient agrees to go by private transport.  Will notify the emergency room of his pending arrival.

## 2019-06-03 NOTE — ED Notes (Signed)
Pt O2 sats in the 90's on RA. See documentation

## 2019-06-04 ENCOUNTER — Encounter (HOSPITAL_COMMUNITY): Payer: Self-pay | Admitting: Internal Medicine

## 2019-06-04 DIAGNOSIS — I10 Essential (primary) hypertension: Secondary | ICD-10-CM

## 2019-06-04 DIAGNOSIS — J189 Pneumonia, unspecified organism: Secondary | ICD-10-CM

## 2019-06-04 DIAGNOSIS — R6510 Systemic inflammatory response syndrome (SIRS) of non-infectious origin without acute organ dysfunction: Secondary | ICD-10-CM

## 2019-06-04 DIAGNOSIS — N179 Acute kidney failure, unspecified: Secondary | ICD-10-CM

## 2019-06-04 DIAGNOSIS — E118 Type 2 diabetes mellitus with unspecified complications: Secondary | ICD-10-CM

## 2019-06-04 DIAGNOSIS — N183 Chronic kidney disease, stage 3 (moderate): Secondary | ICD-10-CM

## 2019-06-04 LAB — BASIC METABOLIC PANEL
Anion gap: 14 (ref 5–15)
BUN: 35 mg/dL — ABNORMAL HIGH (ref 8–23)
CO2: 21 mmol/L — ABNORMAL LOW (ref 22–32)
Calcium: 8.3 mg/dL — ABNORMAL LOW (ref 8.9–10.3)
Chloride: 103 mmol/L (ref 98–111)
Creatinine, Ser: 1.75 mg/dL — ABNORMAL HIGH (ref 0.61–1.24)
GFR calc Af Amer: 41 mL/min — ABNORMAL LOW (ref >60)
GFR calc non Af Amer: 35 mL/min — ABNORMAL LOW (ref >60)
Glucose, Bld: 184 mg/dL — ABNORMAL HIGH (ref 70–99)
Potassium: 3.7 mmol/L (ref 3.5–5.1)
Sodium: 138 mmol/L (ref 135–145)

## 2019-06-04 LAB — MRSA PCR SCREENING: MRSA by PCR: POSITIVE — AB

## 2019-06-04 LAB — CBC
HCT: 27.3 % — ABNORMAL LOW (ref 39.0–52.0)
Hemoglobin: 9 g/dL — ABNORMAL LOW (ref 13.0–17.0)
MCH: 29.4 pg (ref 26.0–34.0)
MCHC: 33 g/dL (ref 30.0–36.0)
MCV: 89.2 fL (ref 80.0–100.0)
Platelets: 210 10*3/uL (ref 150–400)
RBC: 3.06 MIL/uL — ABNORMAL LOW (ref 4.22–5.81)
RDW: 14.6 % (ref 11.5–15.5)
WBC: 16.5 10*3/uL — ABNORMAL HIGH (ref 4.0–10.5)
nRBC: 0 % (ref 0.0–0.2)

## 2019-06-04 LAB — HEPATIC FUNCTION PANEL
ALT: 13 U/L (ref 0–44)
AST: 17 U/L (ref 15–41)
Albumin: 2.8 g/dL — ABNORMAL LOW (ref 3.5–5.0)
Alkaline Phosphatase: 56 U/L (ref 38–126)
Bilirubin, Direct: 0.3 mg/dL — ABNORMAL HIGH (ref 0.0–0.2)
Indirect Bilirubin: 0.7 mg/dL (ref 0.3–0.9)
Total Bilirubin: 1 mg/dL (ref 0.3–1.2)
Total Protein: 6.3 g/dL — ABNORMAL LOW (ref 6.5–8.1)

## 2019-06-04 LAB — SARS CORONAVIRUS 2 BY RT PCR (HOSPITAL ORDER, PERFORMED IN ~~LOC~~ HOSPITAL LAB): SARS Coronavirus 2: NEGATIVE

## 2019-06-04 LAB — PROCALCITONIN: Procalcitonin: 0.76 ng/mL

## 2019-06-04 LAB — GLUCOSE, CAPILLARY
Glucose-Capillary: 166 mg/dL — ABNORMAL HIGH (ref 70–99)
Glucose-Capillary: 167 mg/dL — ABNORMAL HIGH (ref 70–99)
Glucose-Capillary: 174 mg/dL — ABNORMAL HIGH (ref 70–99)
Glucose-Capillary: 209 mg/dL — ABNORMAL HIGH (ref 70–99)

## 2019-06-04 LAB — TROPONIN I (HIGH SENSITIVITY)
Troponin I (High Sensitivity): 41 ng/L — ABNORMAL HIGH (ref <18)
Troponin I (High Sensitivity): 49 ng/L — ABNORMAL HIGH (ref <18)

## 2019-06-04 LAB — HIV ANTIBODY (ROUTINE TESTING W REFLEX): HIV Screen 4th Generation wRfx: NONREACTIVE

## 2019-06-04 LAB — LACTIC ACID, PLASMA: Lactic Acid, Venous: 1.4 mmol/L (ref 0.5–1.9)

## 2019-06-04 LAB — STREP PNEUMONIAE URINARY ANTIGEN: Strep Pneumo Urinary Antigen: NEGATIVE

## 2019-06-04 MED ORDER — SODIUM CHLORIDE 0.9 % IV SOLN
2.0000 g | INTRAVENOUS | Status: DC
  Administered 2019-06-04 – 2019-06-07 (×4): 2 g via INTRAVENOUS
  Filled 2019-06-04 (×2): qty 2
  Filled 2019-06-04: qty 20
  Filled 2019-06-04 (×2): qty 2

## 2019-06-04 MED ORDER — GABAPENTIN 600 MG PO TABS
600.0000 mg | ORAL_TABLET | Freq: Three times a day (TID) | ORAL | Status: DC
  Administered 2019-06-04 (×3): 600 mg via ORAL
  Filled 2019-06-04 (×3): qty 1

## 2019-06-04 MED ORDER — ENOXAPARIN SODIUM 40 MG/0.4ML ~~LOC~~ SOLN
40.0000 mg | SUBCUTANEOUS | Status: DC
  Administered 2019-06-04 – 2019-06-08 (×5): 40 mg via SUBCUTANEOUS
  Filled 2019-06-04 (×5): qty 0.4

## 2019-06-04 MED ORDER — ONDANSETRON HCL 4 MG PO TABS: 4.0000 mg | ORAL_TABLET | Freq: Four times a day (QID) | ORAL | Status: DC | PRN

## 2019-06-04 MED ORDER — CHLORHEXIDINE GLUCONATE CLOTH 2 % EX PADS
6.0000 | MEDICATED_PAD | Freq: Every day | CUTANEOUS | Status: AC
  Administered 2019-06-04 – 2019-06-08 (×5): 6 via TOPICAL

## 2019-06-04 MED ORDER — ACETAMINOPHEN 325 MG PO TABS
650.0000 mg | ORAL_TABLET | Freq: Four times a day (QID) | ORAL | Status: DC | PRN
  Administered 2019-06-04 – 2019-06-05 (×2): 650 mg via ORAL
  Filled 2019-06-04 (×2): qty 2

## 2019-06-04 MED ORDER — HYDROCOD POLST-CPM POLST ER 10-8 MG/5ML PO SUER
5.0000 mL | Freq: Two times a day (BID) | ORAL | Status: DC | PRN
  Administered 2019-06-06 – 2019-06-08 (×4): 5 mL via ORAL
  Filled 2019-06-04 (×4): qty 5

## 2019-06-04 MED ORDER — INSULIN GLARGINE 100 UNIT/ML ~~LOC~~ SOLN
45.0000 [IU] | Freq: Every day | SUBCUTANEOUS | Status: DC
  Administered 2019-06-04 – 2019-06-08 (×5): 45 [IU] via SUBCUTANEOUS
  Filled 2019-06-04 (×5): qty 0.45

## 2019-06-04 MED ORDER — OMEGA-3-ACID ETHYL ESTERS 1 G PO CAPS
1.0000 g | ORAL_CAPSULE | Freq: Two times a day (BID) | ORAL | Status: DC
  Administered 2019-06-04 – 2019-06-08 (×10): 1 g via ORAL
  Filled 2019-06-04 (×10): qty 1

## 2019-06-04 MED ORDER — GUAIFENESIN ER 600 MG PO TB12
1200.0000 mg | ORAL_TABLET | Freq: Two times a day (BID) | ORAL | Status: DC
  Administered 2019-06-04 – 2019-06-08 (×9): 1200 mg via ORAL
  Filled 2019-06-04 (×9): qty 2

## 2019-06-04 MED ORDER — NIACIN ER (ANTIHYPERLIPIDEMIC) 500 MG PO TBCR
500.0000 mg | EXTENDED_RELEASE_TABLET | Freq: Every day | ORAL | Status: DC
  Administered 2019-06-04 – 2019-06-07 (×5): 500 mg via ORAL
  Filled 2019-06-04 (×6): qty 1

## 2019-06-04 MED ORDER — ONDANSETRON HCL 4 MG/2ML IJ SOLN: 4.0000 mg | Freq: Four times a day (QID) | INTRAMUSCULAR | Status: DC | PRN

## 2019-06-04 MED ORDER — GABAPENTIN 300 MG PO CAPS
300.0000 mg | ORAL_CAPSULE | Freq: Three times a day (TID) | ORAL | Status: DC
  Administered 2019-06-04 – 2019-06-08 (×11): 300 mg via ORAL
  Filled 2019-06-04 (×11): qty 1

## 2019-06-04 MED ORDER — MUPIROCIN 2 % EX OINT
1.0000 "application " | TOPICAL_OINTMENT | Freq: Two times a day (BID) | CUTANEOUS | Status: DC
  Administered 2019-06-05 – 2019-06-08 (×4): 1 via NASAL
  Filled 2019-06-04 (×2): qty 22

## 2019-06-04 MED ORDER — SODIUM CHLORIDE 0.9 % IV SOLN
500.0000 mg | INTRAVENOUS | Status: DC
  Administered 2019-06-04 – 2019-06-07 (×4): 500 mg via INTRAVENOUS
  Filled 2019-06-04 (×6): qty 500

## 2019-06-04 MED ORDER — INSULIN ASPART 100 UNIT/ML ~~LOC~~ SOLN
0.0000 [IU] | Freq: Three times a day (TID) | SUBCUTANEOUS | Status: DC
  Administered 2019-06-04 – 2019-06-05 (×6): 2 [IU] via SUBCUTANEOUS
  Administered 2019-06-06: 1 [IU] via SUBCUTANEOUS
  Administered 2019-06-06: 7 [IU] via SUBCUTANEOUS
  Administered 2019-06-06: 5 [IU] via SUBCUTANEOUS
  Administered 2019-06-07: 9 [IU] via SUBCUTANEOUS
  Administered 2019-06-07: 3 [IU] via SUBCUTANEOUS
  Administered 2019-06-07: 5 [IU] via SUBCUTANEOUS
  Administered 2019-06-08 (×2): 3 [IU] via SUBCUTANEOUS

## 2019-06-04 MED ORDER — SODIUM CHLORIDE 0.9 % IV BOLUS
250.0000 mL | Freq: Once | INTRAVENOUS | Status: AC
  Administered 2019-06-04: 250 mL via INTRAVENOUS

## 2019-06-04 MED ORDER — IPRATROPIUM-ALBUTEROL 20-100 MCG/ACT IN AERS
1.0000 | INHALATION_SPRAY | Freq: Four times a day (QID) | RESPIRATORY_TRACT | Status: DC | PRN
  Administered 2019-06-07: 1 via RESPIRATORY_TRACT
  Filled 2019-06-04: qty 4

## 2019-06-04 MED ORDER — SIMVASTATIN 20 MG PO TABS
40.0000 mg | ORAL_TABLET | Freq: Every day | ORAL | Status: DC
  Administered 2019-06-04 – 2019-06-08 (×5): 40 mg via ORAL
  Filled 2019-06-04 (×5): qty 2

## 2019-06-04 MED ORDER — OXYCODONE HCL 5 MG PO TABS: 5.0000 mg | ORAL_TABLET | Freq: Four times a day (QID) | ORAL | Status: DC | PRN

## 2019-06-04 MED ORDER — ACETAMINOPHEN 650 MG RE SUPP: 650.0000 mg | Freq: Four times a day (QID) | RECTAL | Status: DC | PRN

## 2019-06-04 NOTE — H&P (Signed)
History and Physical    Christian [name] [id number] DOB: 1935-04-05 DOA: 06/03/2019  PCP: Susy Frizzle, MD  Patient coming from: Home.  Chief Complaint: Shortness of breath.  HPI: Christian [name] is a 84 y.o. male with history of diabetes mellitus type 2, hypertension, chronic kidney disease stage III, hyperlipidemia has been experiencing shortness of breath with productive cough for the last 1 week which has progressively worsened.  Denies any chest pain.  Had gone to his primary care physician's office over there patient was found to be mildly hypoxic and tachycardic.  Nonspecific ST-T changes in the EKG and was referred to the ER.  Patient denies any nausea vomiting diarrhea.  ED Course: In the ER patient was febrile with temperature 103 F lactate was elevated blood work showed leukocytosis and creatinine is increased from baseline of 1.4 in June to 2.  Chest x-ray shows congestion versus infiltrates.  Blood cultures obtained.  COVID-19 test was done twice which was negative.  Patient started on empiric antibiotics for community-acquired pneumonia and admitted for further management.  Review of Systems: As per HPI, rest all negative.   Past Medical History:  Diagnosis Date  . Arthritis   . Back pain   . Diabetes mellitus    Type 2  . Diabetic neuropathy ([place])   . History of kidney stones   . HTN (hypertension) 03/03/2013  . Hypercholesterolemia   . Leg cramps   . Lower extremity edema   . 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 Skin cancer of arm 04/20/2013   left upper arm  . Spinal stenosis 05/17/2014   L3-L4   . Urgency of urination     Past Surgical History:  Procedure Laterality Date  . APPENDECTOMY     age 78  . KNEE ARTHROPLASTY Right 04/11/2019   Procedure: COMPUTER ASSISTED TOTAL KNEE ARTHROPLASTY;  Surgeon: Dereck Leep, MD;  Location: [facility] ORS;  Service: Orthopedics;  Laterality: Right;  . LUMBAR LAMINECTOMY/DECOMPRESSION MICRODISCECTOMY Bilateral  11/22/2014   Procedure: Laminectomy and foraminotomy lumbar three-lumbar four bilateral ;  Surgeon: Eustace Moore, MD;  Location: [facility];  Service: Neurosurgery;  Laterality: Bilateral;  . SKIN CANCER EXCISION Left    shoulder and hand     reports that he quit smoking about 16 years ago. His smoking use included cigarettes. He smoked 1.00 pack per day. He has never used smokeless tobacco. He reports that he does not drink alcohol or use drugs.  Allergies  Allergen Reactions  . Actos [Pioglitazone] Other (See Comments)    LE Edema--03/2014    History reviewed. No pertinent family history.  Prior to Admission medications   Medication Sig Start Date End Date Taking? Authorizing Provider  acetaminophen (TYLENOL) 500 MG tablet Take 1,000 mg by mouth every 8 (eight) hours as needed for mild pain or moderate pain (back pain).    [provider]  Cholecalciferol (VITAMIN D) 125 MCG (5000 UT) CAPS Take 5,000 Units by mouth daily.     [provider]  Cyanocobalamin (B-12 PO) Take 5,000 mcg by mouth daily.     [provider]  enoxaparin (LOVENOX) 40 MG/0.4ML injection Inject 0.4 mLs (40 mg total) into the skin daily. 04/13/19   Lattie Corns, PA-C  fish oil-omega-3 fatty acids 1000 MG capsule Take 1 g by mouth 2 (two) times daily.     [provider]  furosemide (LASIX) 20 MG tablet Take 20 mg by mouth daily.    [provider]  gabapentin (NEURONTIN) 600 MG tablet TAKE 1 TABLET BY MOUTH THREE TIMES DAILY 05/24/19   Susy Frizzle, MD  Homeopathic Products (LEG CRAMPS PO) Take 2 tablets by mouth as needed.    [provider]  Insulin Pen Needle (BD PEN NEEDLE MICRO U/F) 32G X 6 MM MISC Use with insulin pen 05/14/18   Dixon, Mary B, PA-C  LANTUS SOLOSTAR 100 UNIT/ML Solostar Pen INJECT 64 UNITS INTO THE SKIN DAILY Patient taking differently: Inject 60 Units into the skin daily.  01/27/19   Susy Frizzle, MD  lisinopril  (PRINIVIL,ZESTRIL) 5 MG tablet Take 1 tablet (5 mg total) by mouth daily. 11/12/18   Susy Frizzle, MD  metFORMIN (GLUCOPHAGE) 850 MG tablet TAKE 1 TABLET BY MOUTH TWICE DAILY WITH A MEAL 05/24/19   Susy Frizzle, MD  niacin (NIASPAN) 500 MG CR tablet TAKE 1 TABLET BY MOUTH AT BEDTIME Patient taking differently: Take 500 mg by mouth at bedtime.  02/17/19   Susy Frizzle, MD  Omega-3 Fatty Acids (FISH OIL) 1000 MG CAPS Take by mouth.    [provider]  ONE TOUCH ULTRA TEST test strip USE TO CHECK FASTING BLOOD SUGARS THREE TIMES DAILY 05/07/18   Dena Billet B, PA-C  oxyCODONE (OXY IR/ROXICODONE) 5 MG immediate release tablet Take 1-2 tablets (5-10 mg total) by mouth every 4 (four) hours as needed for moderate pain. 04/13/19   Lattie Corns, PA-C  simvastatin (ZOCOR) 40 MG tablet Take 1 tablet (40 mg total) by mouth daily. 04/25/19   Susy Frizzle, MD  traMADol (ULTRAM) 50 MG tablet Take 1 tablet (50 mg total) by mouth every 6 (six) hours as needed for moderate pain. 04/13/19   Lattie Corns, PA-C    Physical Exam: Constitutional: Moderately built and nourished. Vitals:   06/03/19 2245 06/03/19 2330 06/04/19 0022 06/04/19 0058  BP:  (!) 111/42  118/62  Pulse: (!) 111 93  83  Resp:  (!) 34  (!) 22  Temp:   98.7 F (37.1 C) 98.4 F (36.9 C)  TempSrc:   Oral Oral  SpO2: 96% (!) 89%  92%  Weight:    91.6 kg  Height:    5\' 8"  (1.727 m)   Eyes: Anicteric no pallor. ENMT: No discharge from the ears eyes nose or mouth. Neck: No mass felt.  No neck rigidity. Respiratory: No rhonchi or crepitations. Cardiovascular: S1-S2 heard. Abdomen: Soft nontender bowel sounds present. Musculoskeletal: No edema. Skin: No rash. Neurologic: Alert awake oriented to time place and person.  Moves all extremities. Psychiatric: Appears normal per normal affect.   Labs on Admission: I have personally reviewed following labs and imaging studies  CBC: Recent Labs  Lab 06/03/19 1725   WBC 18.5*  HGB 10.0*  HCT 31.4*  MCV 90.8  PLT [id number]   Basic Metabolic Panel: Recent Labs  Lab 06/03/19 1725  NA 137  K 4.5  CL 101  CO2 21*  GLUCOSE 188*  BUN 32*  CREATININE 2.03*  CALCIUM 8.8*   GFR: Estimated Creatinine Clearance: 30.8 mL/min (A) (by C-G formula based on SCr of 2.03 mg/dL (H)). Liver Function Tests: No results for input(s): AST, ALT, ALKPHOS, BILITOT, PROT, ALBUMIN in the last 168 hours. No results for input(s): LIPASE, AMYLASE in the last 168 hours. No results for input(s): AMMONIA in the last 168 hours. Coagulation Profile: No results for input(s): INR, PROTIME in the last 168 hours. Cardiac Enzymes: No results for input(s): CKTOTAL,  CKMB, CKMBINDEX, TROPONINI in the last 168 hours. BNP (last 3 results) No results for input(s): PROBNP in the last 8760 hours. HbA1C: No results for input(s): HGBA1C in the last 72 hours. CBG: No results for input(s): GLUCAP in the last 168 hours. Lipid Profile: No results for input(s): CHOL, HDL, LDLCALC, TRIG, CHOLHDL, LDLDIRECT in the last 72 hours. Thyroid Function Tests: No results for input(s): TSH, T4TOTAL, FREET4, T3FREE, THYROIDAB in the last 72 hours. Anemia Panel: No results for input(s): VITAMINB12, FOLATE, FERRITIN, TIBC, IRON, RETICCTPCT in the last 72 hours. Urine analysis:    Component Value Date/Time   COLORURINE YELLOW (A) 03/30/2019 1138   APPEARANCEUR CLEAR (A) 03/30/2019 1138   LABSPEC 1.009 03/30/2019 1138   PHURINE 5.0 03/30/2019 [place] 03/30/2019 1138   HGBUR NEGATIVE 03/30/2019 1138   BILIRUBINUR NEGATIVE 03/30/2019 [place] 03/30/2019 1138   PROTEINUR NEGATIVE 03/30/2019 1138   NITRITE NEGATIVE 03/30/2019 1138   LEUKOCYTESUR NEGATIVE 03/30/2019 1138   Sepsis Labs: @LABRCNTIP (procalcitonin:4,lacticidven:4) ) Recent Results (from the past 240 hour(s))  SARS Coronavirus 2 [facility] order, Performed in [facility] hospital lab) Nasopharyngeal  Nasopharyngeal Swab     Status: None   Collection Time: 06/03/19  9:34 PM   Specimen: Nasopharyngeal Swab  Result Value Ref Range Status   SARS Coronavirus 2 NEGATIVE NEGATIVE Final    Comment: (NOTE) If result is NEGATIVE SARS-CoV-2 target nucleic acids are NOT DETECTED. The SARS-CoV-2 RNA is generally detectable in upper and lower  respiratory specimens during the acute phase of infection. The lowest  concentration of SARS-CoV-2 viral copies this assay can detect is 250  copies / mL. A negative result does not preclude SARS-CoV-2 infection  and should not be used as the sole basis for treatment or other  patient management decisions.  A negative result may occur with  improper specimen collection / handling, submission of specimen other  than nasopharyngeal swab, presence of viral mutation(s) within the  areas targeted by this assay, and inadequate number of viral copies  (<250 copies / mL). A negative result must be combined with clinical  observations, patient history, and epidemiological information. If result is POSITIVE SARS-CoV-2 target nucleic acids are DETECTED. The SARS-CoV-2 RNA is generally detectable in upper and lower  respiratory specimens dur ing the acute phase of infection.  Positive  results are indicative of active infection with SARS-CoV-2.  Clinical  correlation with patient history and other diagnostic information is  necessary to determine patient infection status.  Positive results do  not rule out bacterial infection or co-infection with other viruses. If result is PRESUMPTIVE POSTIVE SARS-CoV-2 nucleic acids MAY BE PRESENT.   A presumptive positive result was obtained on the submitted specimen  and confirmed on repeat testing.  While 2019 novel coronavirus  (SARS-CoV-2) nucleic acids may be present in the submitted sample  additional confirmatory testing may be necessary for epidemiological  and / or clinical management purposes  to differentiate between   SARS-CoV-2 and other Sarbecovirus currently known to infect humans.  If clinically indicated additional testing with an alternate test  methodology [phone]) is advised. The SARS-CoV-2 RNA is generally  detectable in upper and lower respiratory sp ecimens during the acute  phase of infection. The expected result is Negative. Fact Sheet for Patients:  StrictlyIdeas.no Fact Sheet for Healthcare Providers: BankingDealers.co.za This test is not yet approved or cleared by the [place] FDA and has been authorized for detection and/or diagnosis of SARS-CoV-2 by FDA  under an Emergency Use Authorization (EUA).  This EUA will remain in effect (meaning this test can be used) for the duration of the COVID-19 declaration under Section 564(b)(1) of the Act, 21 U.S.C. section 360bbb-3(b)(1), unless the authorization is terminated or revoked sooner. Performed at [facility], [place] [address], [place], [place] [address]      Radiological Exams on Admission: Dg Chest 2 View  Result Date: 06/03/2019 CLINICAL DATA:  Shortness of breath EXAM: CHEST - 2 VIEW COMPARISON:  02/17/2018 FINDINGS: Cardiomegaly, vascular congestion. Bilateral perihilar and lower lobe opacities could reflect edema or infection. No visible significant effusions or acute bony abnormality. IMPRESSION: Cardiomegaly, vascular congestion. Bilateral perihilar and lower lobe opacities, favor edema although infection is not excluded. Electronically Signed   By: Rolm Baptise M.D.   On: 06/03/2019 18:21    EKG: Independently reviewed.  Sinus tachycardia nonspecific ST-T changes.  Assessment/Plan Principal Problem:   SIRS (systemic inflammatory response syndrome) (HCC) Active Problems:   HTN (hypertension)   CKD (chronic kidney disease) stage 3, GFR 30-59 ml/min (HCC)   Diabetes mellitus type 2 with complications ([place])   CAP (community acquired pneumonia)   ARF (acute renal  failure) ([place])   AKI (acute kidney injury) ([place])    1. Possible developing sepsis from pneumonia -patient placed on empiric antibiotics for community-acquired pneumonia.  COVID-19 test has been negative twice.  Follow cultures check urine for Legionella strep antigen. 2. Acute on chronic kidney disease stage III -I have ordered 250 cc normal saline bolus.  Will hold lisinopril and Lasix for now.  Follow metabolic panel closely.  Patient's gabapentin dose may need to be decreased if creatinine does not improve. 3. Hypertension holding lisinopril due to acute renal failure.  We will keep patient on PRN IV hydralazine. 4. Normocytic normochromic anemia appears to be chronic. 5. Hyperlipidemia on statins. 6. Recent right knee surgery. 7. Diabetes mellitus type 2 on Lantus insulin.  Decrease the Lantus dose by 15 units due to acute renal failure.  Given that patient is having sepsis-like presentation with pneumonia we will keep patient inpatient status.   DVT prophylaxis: Lovenox. Code Status: Full code. Family Communication: We will need to discuss with family. Disposition Plan: Home. Consults called: None. Admission status: Inpatient.   Rise Patience MD [facility] Pager [phone].  If 7PM-7AM, please contact night-coverage [email or web address] Password [email or web address]  06/04/2019, 1:24 AM

## 2019-06-04 NOTE — Progress Notes (Signed)
The patient was admitted early this morning after midnight and H&P has been reviewed and I am in current agreement with the assessment and plan done by Dr. Gean Birchwood.  Additional changes to the plan of care been made accordingly.  Patient is an 84 year old obese Caucasian male with a past medical history significant for but not limited to diabetes mellitus type 2, hypertension, chronic kidney disease stage III, hyperlipidemia, history o spinal stenosis and neurogenic claudication, as well as arthritis who presented to the hospital with a chief complaint of shortness of breath with productive cough for the last 1 week that have been progressively worsening.  He had gone to his primary care's office and was found to be mildly hypoxic and tachycardic.  EKG showed nonspecific ST-T changes and he was referred to the ER and in the ER he is found to be febrile with blood work showing a leukocytosis and a creatinine that was increased from his baseline of 1.4.  Chest x-ray showed congestion versus infiltrates and blood cultures were obtained.  COVID-19 testing was done twice which was negative started on empiric antibiotics for [facility] pneumonia and admitted for further management.  Upon further review it appears the patient has been taking Lasix as an outpatient and does not have echocardiogram so we will obtain echocardiogram to rule out a heart failure component.  For sepsis secondary to pneumonia and placed on empiric antibiotics.  He also had an acute on chronic kidney disease stage III and was given a small 250 cc bolus and lisinopril and Lasix were held for now.  Because the patient appeared mildly volume overloaded on examination we will obtain an echocardiogram to rule out heart failure component.  Patient's lactic acid trended down and his troponin was relatively flat.  Procalcitonin level of 0.76.  Patient is AKI on CKD is improving slightly.  MRSA PCR was positive and strep pneumo urinary antigen was  negative.  Patient was admitted for the following but not limited to:  Sepsis from Pneumonia poA -patient placed on empiric antibiotics for community-acquired pneumonia. -COVID-19 test has been negative twice.   -Follow cultures check urine for Legionella strep antigen. -Repeat chest x-ray in the a.m. -Continue with empiric antibiotics with azithromycin and ceftriaxone -Continue with ipratropium-albuterol 1 puff every 6 hours as needed for wheezing shortness of breath -Added guaifenesin 200 g p.o. twice daily  Acute on chronic kidney disease stage III  -Was given a 250 cc normal saline bolus.   -Will hold lisinopril and Lasix for now.  Follow metabolic panel closely.  Patient's gabapentin dose may need to be decreased if creatinine does not improve.  Hypertension holding lisinopril due to acute renal failure.   -We will keep patient on PRN IV hydralazine.  Normocytic normochromic anemia appears to be chronic.  Hyperlipidemia -C/w simvastatin 40 mg p.o. daily and omega-3 acid ethyl esters 1 g p.o. twice daily  Recent right knee surgery.  Diabetes mellitus type 2 on Lantus insulin.   -Decrease the Lantus dose by 15 units due to acute renal failure.  Cardiomegaly and vascular congestion rule out heart failure component -Check Echocardiogram -BNP was 91 -Strict I's and O's, Daily Weights and restrict fluids to 1500 mL's -Continue to monitor for signs and symptoms of volume overload -We will repeat chest x-ray in a.m.  We will continue to monitor patient's clinical response to intervention and repeat blood work and imaging in the a.m.

## 2019-06-04 NOTE — Progress Notes (Signed)
Pt says that he has medical advanced directives at home; no documents found in chart review/care everywhere

## 2019-06-04 NOTE — Evaluation (Signed)
Clinical/Bedside Swallow Evaluation Patient Details  Name: Christian [name] MRN: [id number] Date of Birth: 1935-07-08  Today's Date: 06/04/2019 Time: SLP Start Time (ACUTE ONLY): 1330 SLP Stop Time (ACUTE ONLY): 1345 SLP Time Calculation (min) (ACUTE ONLY): 15 min  Past Medical History:  Past Medical History:  Diagnosis Date  . Arthritis   . Back pain   . Diabetes mellitus    Type 2  . Diabetic neuropathy ([place])   . History of kidney stones   . HTN (hypertension) 03/03/2013  . Hypercholesterolemia   . Leg cramps   . Lower extremity edema   . 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 Skin cancer of arm 04/20/2013   left upper arm  . Spinal stenosis 05/17/2014   L3-L4   . Urgency of urination    Past Surgical History:  Past Surgical History:  Procedure Laterality Date  . APPENDECTOMY     age 89  . KNEE ARTHROPLASTY Right 04/11/2019   Procedure: COMPUTER ASSISTED TOTAL KNEE ARTHROPLASTY;  Surgeon: Dereck Leep, MD;  Location: [facility] ORS;  Service: Orthopedics;  Laterality: Right;  . LUMBAR LAMINECTOMY/DECOMPRESSION MICRODISCECTOMY Bilateral 11/22/2014   Procedure: Laminectomy and foraminotomy lumbar three-lumbar four bilateral ;  Surgeon: Eustace Moore, MD;  Location: [place] [facility];  Service: Neurosurgery;  Laterality: Bilateral;  . SKIN CANCER EXCISION Left    shoulder and hand   HPI:  Patient is an 84 y.o. male with PMH: DM-2, HTN, chronic kidney disease stage III, HLD, who has been experiencing SOB with productive cough for past week prior to hospitalization. He was referred to ER by his PCP. In ER he was febrile with temperature at 103 degrees F, CXR showed congestion vs infiltrates. Covid-19 test completed twice and both were negative. Patient was started on empiric antibiotics for community acquired PNA.   Assessment / Plan / Recommendation Clinical Impression  Patient presents with oral swallow that is WNL and with questionable pharyngeal phase dysphagia versus respiratory  related coughing. He exhibited intermittent coughing which was dry and mildly hoarse sounding, in absence of PO's, but which seemed slightly increased in frequency during PO's of water and saltine cracker intake. No other overt s/s of aspiration or penetration. Plan to not make any changes in diet consistencies at this time, but will continue to follow and determine need for MBS. SLP Visit Diagnosis: Dysphagia, unspecified (R13.10)    Aspiration Risk  Mild aspiration risk;Moderate aspiration risk    Diet Recommendation Thin liquid;Regular   Liquid Administration via: Cup;Straw Medication Administration: Whole meds with liquid Supervision: Patient able to self feed Compensations: Minimize environmental distractions;Slow rate;Small sips/bites Postural Changes: Seated upright at 90 degrees    Other  Recommendations Oral Care Recommendations: Oral care BID   Follow up Recommendations None      Frequency and Duration min 1 x/week  1 week       Prognosis Prognosis for Safe Diet Advancement: Good      Swallow Study   General Date of Onset: 06/03/19 HPI: Patient is an 84 y.o. male with PMH: DM-2, HTN, chronic kidney disease stage III, HLD, who has been experiencing SOB with productive cough for past week prior to hospitalization. He was referred to ER by his PCP. In ER he was febrile with temperature at 103 degrees F, CXR showed congestion vs infiltrates. Covid-19 test completed twice and both were negative. Patient was started on empiric antibiotics for community acquired PNA. Type of Study: Bedside Swallow Evaluation Previous Swallow Assessment: N/A Diet Prior to this  Study: Regular;Thin liquids Temperature Spikes Noted: No Respiratory Status: Nasal cannula History of Recent Intubation: No Behavior/Cognition: Alert;Cooperative;Pleasant mood Oral Cavity Assessment: Within Functional Limits Oral Care Completed by SLP: No Oral Cavity - Dentition: Adequate natural dentition Vision:  Functional for self-feeding Self-Feeding Abilities: Able to feed self Patient Positioning: Upright in bed Baseline Vocal Quality: Low vocal intensity Volitional Cough: Strong Volitional Swallow: Able to elicit    Oral/Motor/Sensory Function Overall Oral Motor/Sensory Function: Within functional limits   Ice Chips     Thin Liquid Thin Liquid: Impaired Presentation: Straw Pharyngeal  Phase Impairments: Cough - Delayed Other Comments: intermittent dry, hoarse sounding cough that occurred in absence of PO's at times    Nectar Thick     Honey Thick     Puree Puree: Not tested   Solid     Solid: Impaired Presentation: Self Fed Pharyngeal Phase Impairments: Cough - Delayed Other Comments: intermittent dry, hoarse sounding cough      Nadara Mode Tarrell 06/04/2019,5:46 PM  Sonia Baller, MA, CCC-SLP Speech Therapy [facility]

## 2019-06-04 NOTE — Progress Notes (Signed)
Pt desat to high 80's o2 while sleeping on room air; sats in high 90's with 1L o2 via nasal cannula

## 2019-06-05 ENCOUNTER — Inpatient Hospital Stay (HOSPITAL_COMMUNITY): Payer: PPO

## 2019-06-05 DIAGNOSIS — R0609 Other forms of dyspnea: Secondary | ICD-10-CM

## 2019-06-05 DIAGNOSIS — R6520 Severe sepsis without septic shock: Secondary | ICD-10-CM

## 2019-06-05 DIAGNOSIS — R0602 Shortness of breath: Secondary | ICD-10-CM

## 2019-06-05 DIAGNOSIS — A419 Sepsis, unspecified organism: Principal | ICD-10-CM

## 2019-06-05 LAB — GLUCOSE, CAPILLARY
Glucose-Capillary: 152 mg/dL — ABNORMAL HIGH (ref 70–99)
Glucose-Capillary: 155 mg/dL — ABNORMAL HIGH (ref 70–99)
Glucose-Capillary: 157 mg/dL — ABNORMAL HIGH (ref 70–99)
Glucose-Capillary: 159 mg/dL — ABNORMAL HIGH (ref 70–99)
Glucose-Capillary: 191 mg/dL — ABNORMAL HIGH (ref 70–99)

## 2019-06-05 LAB — MAGNESIUM: Magnesium: 1.4 mg/dL — ABNORMAL LOW (ref 1.7–2.4)

## 2019-06-05 LAB — COMPREHENSIVE METABOLIC PANEL
ALT: 15 U/L (ref 0–44)
AST: 24 U/L (ref 15–41)
Albumin: 2.5 g/dL — ABNORMAL LOW (ref 3.5–5.0)
Alkaline Phosphatase: 54 U/L (ref 38–126)
Anion gap: 13 (ref 5–15)
BUN: 34 mg/dL — ABNORMAL HIGH (ref 8–23)
CO2: 21 mmol/L — ABNORMAL LOW (ref 22–32)
Calcium: 8.4 mg/dL — ABNORMAL LOW (ref 8.9–10.3)
Chloride: 106 mmol/L (ref 98–111)
Creatinine, Ser: 1.66 mg/dL — ABNORMAL HIGH (ref 0.61–1.24)
GFR calc Af Amer: 44 mL/min — ABNORMAL LOW (ref >60)
GFR calc non Af Amer: 38 mL/min — ABNORMAL LOW (ref >60)
Glucose, Bld: 166 mg/dL — ABNORMAL HIGH (ref 70–99)
Potassium: 3.8 mmol/L (ref 3.5–5.1)
Sodium: 140 mmol/L (ref 135–145)
Total Bilirubin: 0.8 mg/dL (ref 0.3–1.2)
Total Protein: 6 g/dL — ABNORMAL LOW (ref 6.5–8.1)

## 2019-06-05 LAB — CBC WITH DIFFERENTIAL/PLATELET
Abs Immature Granulocytes: 0.11 10*3/uL — ABNORMAL HIGH (ref 0.00–0.07)
Basophils Absolute: 0.1 10*3/uL (ref 0.0–0.1)
Basophils Relative: 0 %
Eosinophils Absolute: 0.3 10*3/uL (ref 0.0–0.5)
Eosinophils Relative: 2 %
HCT: 26.7 % — ABNORMAL LOW (ref 39.0–52.0)
Hemoglobin: 8.8 g/dL — ABNORMAL LOW (ref 13.0–17.0)
Immature Granulocytes: 1 %
Lymphocytes Relative: 7 %
Lymphs Abs: 1.1 10*3/uL (ref 0.7–4.0)
MCH: 28.7 pg (ref 26.0–34.0)
MCHC: 33 g/dL (ref 30.0–36.0)
MCV: 87 fL (ref 80.0–100.0)
Monocytes Absolute: 1.2 10*3/uL — ABNORMAL HIGH (ref 0.1–1.0)
Monocytes Relative: 8 %
Neutro Abs: 12.4 10*3/uL — ABNORMAL HIGH (ref 1.7–7.7)
Neutrophils Relative %: 82 %
Platelets: 205 10*3/uL (ref 150–400)
RBC: 3.07 MIL/uL — ABNORMAL LOW (ref 4.22–5.81)
RDW: 14.6 % (ref 11.5–15.5)
WBC: 15.1 10*3/uL — ABNORMAL HIGH (ref 4.0–10.5)
nRBC: 0 % (ref 0.0–0.2)

## 2019-06-05 LAB — PHOSPHORUS: Phosphorus: 2.2 mg/dL — ABNORMAL LOW (ref 2.5–4.6)

## 2019-06-05 LAB — EXPECTORATED SPUTUM ASSESSMENT W GRAM STAIN, RFLX TO RESP C

## 2019-06-05 LAB — PROCALCITONIN: Procalcitonin: 0.73 ng/mL

## 2019-06-05 MED ORDER — K PHOS MONO-SOD PHOS DI & MONO 155-852-130 MG PO TABS
500.0000 mg | ORAL_TABLET | Freq: Two times a day (BID) | ORAL | Status: AC
  Administered 2019-06-05 (×2): 500 mg via ORAL
  Filled 2019-06-05 (×2): qty 2

## 2019-06-05 MED ORDER — MAGNESIUM SULFATE 2 GM/50ML IV SOLN
2.0000 g | Freq: Once | INTRAVENOUS | Status: AC
  Administered 2019-06-05: 2 g via INTRAVENOUS
  Filled 2019-06-05: qty 50

## 2019-06-05 NOTE — Evaluation (Signed)
Occupational Therapy Evaluation Patient Details Name: Christian [name] MRN: [id number] DOB: Feb 07, 1935 Today's Date: 06/05/2019    History of Present Illness Patient is an 84 y.o. male with PMH: DM-2, HTN, chronic kidney disease stage III, HLD, who has been experiencing SOB with productive cough for past week prior to hospitalization. He was referred to ER by his PCP. In ER he was febrile with temperature at 103 degrees F, CXR showed congestion vs infiltrates. Covid-19 test completed twice and both were negative. Patient was started on empiric antibiotics for community acquired PNA.    Clinical Impression   Pt PTA: living alone and reports independence. Pt currently, minA to modA +2 for progressive mobility and modA for bed mobility. Pt very unsteady with mobility and RW. Pt minA to maxA for ADL tasks and very abruptly sitting down when finished washing hands after 1 min of standing. Pt presents with decreased strength, poor ability to care for self and poor endurance for ADL and mobility. Pt would greatly benefit from continued OT skilled services for ADL, mobility and energy conservation. OT following acutely.  O2 >94% on RA after exertion.    Follow Up Recommendations  SNF;Supervision/Assistance - 24 hour    Equipment Recommendations  Other (comment)(to be determined)    Recommendations for Other Services       Precautions / Restrictions Precautions Precautions: Other (comment)(contact/droplet) Restrictions Weight Bearing Restrictions: No      Mobility Bed Mobility Overal bed mobility: Needs Assistance Bed Mobility: Supine to Sit     Supine to sit: Mod assist;+2 for physical assistance;+2 for safety/equipment;HOB elevated     General bed mobility comments: Pt requiring assist for trunk stability with elevation and reduction of posterior lean to EOB  Transfers Overall transfer level: Needs assistance Equipment used: Rolling walker (2 wheeled) Transfers: Sit to/from  Stand Sit to Stand: Mod assist;+2 physical assistance;+2 safety/equipment         General transfer comment: requires assist for initial steadiness and stability with RW    Balance Overall balance assessment: Needs assistance Sitting-balance support: Bilateral upper extremity supported;Feet supported Sitting balance-Leahy Scale: Poor Sitting balance - Comments: posterior lean requiring assist to create upright sitting posture Postural control: Posterior lean Standing balance support: Bilateral upper extremity supported;During functional activity Standing balance-Leahy Scale: Poor Standing balance comment: Washing hands at sink stood for 1 min then required seated rest break                           ADL either performed or assessed with clinical judgement   ADL Overall ADL's : Needs assistance/impaired Eating/Feeding: Modified independent;Sitting Eating/Feeding Details (indicate cue type and reason): RN reports pt was slumped in bed and would not use abdmonimals to increase position to [facility] for proper eating position  Grooming: Min guard;Standing   Upper Body Bathing: Minimal assistance;Sitting   Lower Body Bathing: Maximal assistance;Sitting/lateral leans;Sit to/from stand   Upper Body Dressing : Minimal assistance;Standing   Lower Body Dressing: Maximal assistance;Sitting/lateral leans;Sit to/from stand   Toilet Transfer: Minimal assistance;+2 for physical assistance;+2 for safety/equipment;BSC   Toileting- Clothing Manipulation and Hygiene: Maximal assistance;+2 for physical assistance;+2 for safety/equipment;Sitting/lateral lean;Sit to/from stand       Functional mobility during ADLs: Minimal assistance;+2 for physical assistance;+2 for safety/equipment;Rolling walker General ADL Comments: Pt very unsteady with mobility and RW. Pt minA to maxA for ADL tasks and very abruptly sitting down when finished washing hands.     Vision Baseline Vision/History: Wears  glasses Wears Glasses: Reading only Patient Visual Report: No change from baseline Vision Assessment?: No apparent visual deficits     Perception     Praxis      Pertinent Vitals/Pain Pain Assessment: No/denies pain     Hand Dominance Right   Extremity/Trunk Assessment Upper Extremity Assessment Upper Extremity Assessment: Generalized weakness;RUE deficits/detail;LUE deficits/detail RUE Deficits / Details: 3-/5 MM grade in BUEs, grip strength 4/5 MM grade LUE Deficits / Details: 3-/5 MM grade in BUEs, grip strength 4/5 MM grade   Lower Extremity Assessment Lower Extremity Assessment: Defer to PT evaluation;Generalized weakness       Communication Communication Communication: No difficulties   Cognition Arousal/Alertness: Awake/alert Behavior During Therapy: WFL for tasks assessed/performed Overall Cognitive Status: Within Functional Limits for tasks assessed                                     General Comments  O2 >96% on RA w/ exertion; RN said to leave off.    Exercises     Shoulder Instructions      Home Living Family/patient expects to be discharged to:: Private residence Living Arrangements: Alone Available Help at Discharge: Family Type of Home: House Home Access: [place]: One level     Bathroom Shower/Tub: Hospital doctor Toilet: Handicapped height     Home Equipment: Environmental consultant - 4 wheels;Cane - single point;Crutches          Prior Functioning/Environment Level of Independence: Independent with assistive device(s)        Comments: reports back to driving s/p TKA, independent with ADL and no AD for mobility        OT Problem List: Decreased strength;Decreased activity tolerance;Impaired balance (sitting and/or standing);Decreased safety awareness;Decreased coordination;Pain;Impaired UE functional use      OT Treatment/Interventions: Therapeutic exercise;Self-care/ADL training;Energy  conservation;Therapeutic activities;Patient/family education;Balance training    OT Goals(Current goals can be found in the care plan section) Acute Rehab OT Goals Patient Stated Goal: to go home OT Goal Formulation: With patient Time For Goal Achievement: 06/19/19 Potential to Achieve Goals: Good ADL Goals Pt Will Perform Grooming: with modified independence;standing Pt Will Perform Upper Body Dressing: with modified independence;sitting Pt Will Perform Lower Body Dressing: with modified independence;sitting/lateral leans;sit to/from stand Pt Will Transfer to Toilet: with modified independence;ambulating Pt Will Perform Toileting - Clothing Manipulation and hygiene: with modified independence;sitting/lateral leans;sit to/from stand Additional ADL Goal #1: Pt will perform x10 mins of OOB ADL with supervision levelA  OT Frequency: Min 2X/week   Barriers to D/C: Decreased caregiver support  lives alone       Co-evaluation              AM-PAC OT "6 Clicks" Daily Activity     Outcome Measure Help from another person eating meals?: None Help from another person taking care of personal grooming?: A Little Help from another person toileting, which includes using toliet, bedpan, or urinal?: A Lot Help from another person bathing (including washing, rinsing, drying)?: A Lot Help from another person to put on and taking off regular upper body clothing?: A Little Help from another person to put on and taking off regular lower body clothing?: A Lot 6 Click Score: 16   End of Session Equipment Utilized During Treatment: Gait belt;Rolling walker Nurse Communication: Mobility status  Activity Tolerance: Patient limited by fatigue;Treatment limited secondary to medical complications (  Comment) Patient left: in chair;with call bell/phone within reach;with chair alarm set  OT Visit Diagnosis: Unsteadiness on feet (R26.81);Muscle weakness (generalized) (M62.81)                Time:  [id number] OT Time Calculation (min): 23 min Charges:  OT General Charges $OT Visit: 1 Visit OT Evaluation $OT Eval Moderate Complexity: 1 Mod  Darryl Nestle) Marsa Aris OTR/L Acute Rehabilitation Services Pager: [phone] Office: [place] 06/05/2019, 4:47 PM

## 2019-06-05 NOTE — Evaluation (Signed)
Physical Therapy Evaluation Patient Details Name: Christian [name] MRN: [id number] DOB: Mar 08, 1935 Today's Date: 06/05/2019   History of Present Illness  Patient is an 84 y.o. male with PMH: DM-2, HTN, chronic kidney disease stage III, HLD, who has been experiencing SOB with productive cough for past week prior to hospitalization. He was referred to ER by his PCP. In ER he was febrile with temperature at 103 degrees F, CXR showed congestion vs infiltrates. Covid-19 test completed twice and both were negative. Patient was started on empiric antibiotics for community acquired PNA.   Clinical Impression   Pt admitted with above diagnosis. Pt PTA: living alone and reports independence. Pt currently, minA to modA +2 for progressive mobility and modA for bed mobility. Pt very unsteady with mobility and RW; Recommending sNF for post-acute rehab to maximize independence and safety with mobility;  Pt currently with functional limitations due to the deficits listed below (see PT Problem List). Pt will benefit from skilled PT to increase their independence and safety with mobility to allow discharge to the venue listed below.       Follow Up Recommendations SNF    Equipment Recommendations  Rolling walker with 5" wheels;3in1 (PT)    Recommendations for Other Services       Precautions / Restrictions Precautions Precautions: Other (comment);Fall(contact/droplet) Restrictions Weight Bearing Restrictions: No      Mobility  Bed Mobility Overal bed mobility: Needs Assistance Bed Mobility: Supine to Sit     Supine to sit: Mod assist;+2 for physical assistance;+2 for safety/equipment;HOB elevated     General bed mobility comments: Pt requiring assist for trunk stability with elevation and reduction of posterior lean to EOB  Transfers Overall transfer level: Needs assistance Equipment used: Rolling walker (2 wheeled) Transfers: Sit to/from Stand Sit to Stand: Mod assist;+2 physical  assistance;+2 safety/equipment         General transfer comment: requires assist for initial steadiness and stability with RW  Ambulation/Gait Ambulation/Gait assistance: Min assist;+2 safety/equipment Gait Distance (Feet): 6 Feet Assistive device: Rolling walker (2 wheeled) Gait Pattern/deviations: Step-through pattern;Decreased step length - right;Decreased step length - left;Trunk flexed     General Gait Details: Cues to self-monitor for activity tolerance; unsteady, and noteworthy incr fatigue with more time with walking/standing activity  Stairs            Wheelchair Mobility    Modified Rankin (Stroke Patients Only)       Balance Overall balance assessment: Needs assistance Sitting-balance support: Bilateral upper extremity supported;Feet supported Sitting balance-Leahy Scale: Poor Sitting balance - Comments: posterior lean requiring assist to create upright sitting posture Postural control: Posterior lean Standing balance support: Bilateral upper extremity supported;During functional activity Standing balance-Leahy Scale: Poor Standing balance comment: Washing hands at sink stood for 1 min then required seated rest break                             Pertinent Vitals/Pain Pain Assessment: No/denies pain Faces Pain Scale: No hurt    Home Living Family/patient expects to be discharged to:: Private residence Living Arrangements: Alone Available Help at Discharge: Family Type of Home: House Home Access: Ramped entrance     Home Layout: One level Home Equipment: Environmental consultant - 4 wheels;Cane - single point;Crutches      Prior Function Level of Independence: Independent with assistive device(s)         Comments: reports back to driving s/p TKA, independent with ADL and no  AD for mobility     Hand Dominance   Dominant Hand: Right    Extremity/Trunk Assessment   Upper Extremity Assessment Upper Extremity Assessment: Defer to OT  evaluation RUE Deficits / Details: 3-/5 MM grade in BUEs, grip strength 4/5 MM grade LUE Deficits / Details: 3-/5 MM grade in BUEs, grip strength 4/5 MM grade    Lower Extremity Assessment Lower Extremity Assessment: Generalized weakness;RLE deficits/detail RLE Deficits / Details: R knee extension to just a few degrees shy of full extension       Communication   Communication: No difficulties  Cognition Arousal/Alertness: Awake/alert Behavior During Therapy: WFL for tasks assessed/performed Overall Cognitive Status: Within Functional Limits for tasks assessed                                        General Comments General comments (skin integrity, edema, etc.): O2 >96% on RA w/ exertion; RN said to leave off.    Exercises     Assessment/Plan    PT Assessment Patient needs continued PT services  PT Problem List Decreased activity tolerance;Decreased balance;Decreased mobility;Decreased coordination;Decreased range of motion;Decreased strength;Decreased knowledge of use of DME;Decreased safety awareness       PT Treatment Interventions DME instruction;Gait training;Stair training;Functional mobility training;Therapeutic activities;Therapeutic exercise;Balance training;Patient/family education    PT Goals (Current goals can be found in the Care Plan section)  Acute Rehab PT Goals Patient Stated Goal: to go home PT Goal Formulation: With patient Time For Goal Achievement: 06/19/19 Potential to Achieve Goals: Fair    Frequency Min 2X/week   Barriers to discharge Decreased caregiver support      Co-evaluation PT/OT/SLP Co-Evaluation/Treatment: Yes Reason for Co-Treatment: For patient/therapist safety PT goals addressed during session: Mobility/safety with mobility         AM-PAC PT "6 Clicks" Mobility  Outcome Measure Help needed turning from your back to your side while in a flat bed without using bedrails?: A Little Help needed moving from lying  on your back to sitting on the side of a flat bed without using bedrails?: A Little Help needed moving to and from a bed to a chair (including a wheelchair)?: A Little Help needed standing up from a chair using your arms (e.g., wheelchair or bedside chair)?: A Lot Help needed to walk in hospital room?: A Lot Help needed climbing 3-5 steps with a railing? : Total 6 Click Score: 14    End of Session Equipment Utilized During Treatment: Gait belt Activity Tolerance: Patient tolerated treatment well Patient left: in chair;with call bell/phone within reach;with chair alarm set Nurse Communication: Mobility status PT Visit Diagnosis: Unsteadiness on feet (R26.81);Other abnormalities of gait and mobility (R26.89);Muscle weakness (generalized) (M62.81)    Time: [id number] PT Time Calculation (min) (ACUTE ONLY): 23 min   Charges:   PT Evaluation $PT Eval Moderate Complexity: 1 Mod          Roney Marion, PT  Acute Rehabilitation Services Pager [phone] Office [place] 06/05/2019, 4:56 PM

## 2019-06-05 NOTE — Progress Notes (Signed)
PROGRESS NOTE    Christian [name]  [id number] DOB: Dec 23, 1934 DOA: 06/03/2019 PCP: Susy Frizzle, MD   Brief Narrative:  The Patient is an 84 year old obese Caucasian male with a past medical history significant for but not limited to diabetes mellitus type 2, hypertension, chronic kidney disease stage III, hyperlipidemia, history o spinal stenosis and neurogenic claudication, as well as arthritis who presented to the hospital with a chief complaint of shortness of breath with productive cough for the last 1 week that have been progressively worsening.  He had gone to his primary care's office and was found to be mildly hypoxic and tachycardic.  EKG showed nonspecific ST-T changes and he was referred to the ER and in the ER he is found to be febrile with blood work showing a leukocytosis and a creatinine that was increased from his baseline of 1.4.  Chest x-ray showed congestion versus infiltrates and blood cultures were obtained.  COVID-19 testing was done twice which was negative started on empiric antibiotics for [facility] pneumonia and admitted for further management.  Upon further review it appears the patient has been taking Lasix as an outpatient and does not have echocardiogram so we will obtain echocardiogram to rule out a heart failure component.  For sepsis secondary to pneumonia and placed on empiric antibiotics.  He also had an acute on chronic kidney disease stage III and was given a small 250 cc bolus and lisinopril and Lasix were held for now.  Because the patient appeared mildly volume overloaded on examination we will obtain an echocardiogram to rule out heart failure component.  Patient's lactic acid trended down and his troponin was relatively flat.  Procalcitonin level of 0.76 on admit and trended down to 0.73.  Patient is AKI on CKD is improving slightly.  MRSA PCR was positive and strep pneumo urinary antigen was negative.   **Interim History Labs are improving but patient  feels about the same.  Echocardiogram still pending to be done.  Electrolytes are being repleted.  We will continue treatment for pneumonia  Assessment & Plan:   Principal Problem:   SIRS (systemic inflammatory response syndrome) (HCC) Active Problems:   HTN (hypertension)   CKD (chronic kidney disease) stage 3, GFR 30-59 ml/min (HCC)   Diabetes mellitus type 2 with complications ([place])   CAP (community acquired pneumonia)   ARF (acute renal failure) ([place])   AKI (acute kidney injury) ([place])  Sepsis from PneumoniapoA Acute respiratory failure with hypoxia in the setting of pneumonia and possibly CHF exacerbation -Patient placed on empiric antibiotics for community-acquired pneumonia.  -COVID-19 test has been negative twice.  -Follow cultures; blood cultures cultures x2 showed no growth to date at 2 days -Check urine for Legionella strep antigen. -Strep pneumo urine antigen was negative  -Patient's procalcitonin level was 0.076 on admission and then trended down to 0.73 -WBC is trending down and went from 18.5 is now 15.1 -Repeat chest x-ray this a.m. showed "Bilateral perihilar and lower lobe interstitial opacities which could reflect edema or infection appear mildly progressed, although this could simply reflect increased bronchovascular crowding due to lower lung volumes when compared to prior study." -Continue with empiric antibiotics with azithromycin and ceftriaxone for now -Continue with ipratropium-albuterol 1 puff every 6 hours as needed for wheezing shortness of breath -Added guaifenesin 1200 g p.o. twice daily as well as Tussionex every 12h as needed; patient states he feels about the same and is still coughing. -Need to check a respiratory virus panel in the  a.m. if still continues to cough -Continue supplemental oxygen via nasal cannula and wean O2 as tolerated -Continuous pulse oximetry and maintain O2 saturations greater than 92% -We will need an ambulatory home O2 screen  prior to discharge -If necessary will start patient on steroids -PT OT to evaluate and treat  Acute on chronic kidney disease stage III, improving -Was given a 250 cc normal saline bolus.  No further fluids currently -Will hold lisinopril and Lasix for now.  -Decrease patient's gabapentin dose -Patient's BUN/creatinine is improving from 32/2.03 is now 34/1.66 -Avoid nephrotoxic medications, contrast dyes as well as hypotension possible -Continue to monitor and trend renal function -Repeat CMP in a.m.  Hypertension  -BP this AM was 135/57 -Holding lisinopril due to acute renal failure.  -We will keep patient on PRN IV hydralazine.  Normocytic Normochromic Anemia/Anemia of Chronic Kidney Disease -Patient's hemoglobin/hematocrit went from 10.0/31.4 on admission is now 8.8/26.7 We will check anemia panel in the a.m. -Continue to monitor for signs and symptoms of bleeding; currently no overt bleeding noted -Likely in the setting of chronic kidney disease -Repeat CBC in a.m.  Hyperlipidemia -C/w simvastatin 40 mg p.o. daily and omega-3 acid ethyl esters 1 g p.o. twice daily  Recent right knee surgery. -We will get PT OT to evaluate and treat  Diabetes mellitus type 2 on Lantus insulin located by diabetic neuropathy  -Decrease the Lantus dose by 15 units due to acute renal failure.  Currently on 45 units subcu daily  -Also added sensitive and alongside scale insulin before meals -Also decrease gabapentin dose to 300 mg p.o. 3 times daily -Continue to monitor CBGs closely; CBGs are ranging from 152-209 -Continue monitor blood sugars carefully and adjust insulin as necessary  Cardiomegaly and vascular congestion rule out heart failure component -Check Echocardiogram is still pending to be done -BNP was 91 -Strict I's and O's, Daily Weights and restrict fluids to 1500 mL's -Patient is negative 210 mL since admission and weight has not been repeated as it was 201.9 pounds  yesterday -Continue to monitor for signs and symptoms of volume overload -Repeat chest x-ray this a.m. showed "Bilateral perihilar and lower lobe interstitial opacities which could reflect edema or infection appear mildly progressed, although this could simply reflect increased bronchovascular crowding due to lower lung volumes when compared to prior study."  Hypomagnesemia -Patient magnesium level is 1.4 -Replete with IV mag sulfate 2 g -Continue to monitor and replete as necessary -Repeat magnesium level in a.m.  Hypophosphatemia Palpation phosphorus level this morning was 2.2 -Replete with p.o. K-Phos Neutral 500 mg p.o. twice daily x2 doses -Continue to monitor and replete as necessary -Repeat phosphorus level in a.m.  Obesity -Estimated body mass index is 30.71 kg/m as calculated from the following:   Height as of this encounter: 5\' 8"  (1.727 m).   Weight as of this encounter: 91.6 kg. -Weight Loss and Dietary Counseling given   DVT prophylaxis: Enoxaparin 40 mg subcu every 24h Code Status: FULL CODE  Family Communication: No family present at bedside Disposition Plan: Continue further work-up and treatment of his pneumonia and possibly CHF.  PT and OT are to evaluate the patient.  Consultants:   None   Procedures:  ECHOCARDIOGRAM Ordered and still pending to be done   Antimicrobials:  Anti-infectives (From admission, onward)   Start     Dose/Rate Route Frequency Ordered Stop   06/04/19 1800  cefTRIAXone (ROCEPHIN) 2 g in sodium chloride 0.9 % 100 mL IVPB  2 g 200 mL/hr over 30 Minutes Intravenous Every 24 hours 06/04/19 0122 06/09/19 1759   06/04/19 1800  azithromycin (ZITHROMAX) 500 mg in sodium chloride 0.9 % 250 mL IVPB     500 mg 250 mL/hr over 60 Minutes Intravenous Every 24 hours 06/04/19 0122 06/09/19 1759   06/03/19 2045  cefTRIAXone (ROCEPHIN) 1 g in sodium chloride 0.9 % 100 mL IVPB     1 g 200 mL/hr over 30 Minutes Intravenous  Once 06/03/19 2044  06/03/19 2351   06/03/19 2045  azithromycin (ZITHROMAX) 500 mg in sodium chloride 0.9 % 250 mL IVPB     500 mg 250 mL/hr over 60 Minutes Intravenous  Once 06/03/19 2044 06/03/19 2351     Subjective: Seen and examined at bedside states he is feeling about the same as yesterday.  Complaining of some chest discomfort with coughing.  No lightheadedness or dizziness.  No nausea or vomiting.  States he did not like the food at all and states it was very cold and was very upset about this.  No other concerns at present time.  Objective: Vitals:   06/04/19 2212 06/05/19 0300 06/05/19 0724 06/05/19 0832  BP:  126/62 (!) 135/57   Pulse:  81 81   Resp:  (!) 22 18   Temp: 99.7 F (37.6 C) 99.3 F (37.4 C)  98.7 F (37.1 C)  TempSrc: Oral Oral  Oral  SpO2:  99% 97%   Weight:      Height:        Intake/Output Summary (Last 24 hours) at 06/05/2019 1603 Last data filed at 06/05/2019 0300 Gross per 24 hour  Intake 350 ml  Output 600 ml  Net -250 ml   Filed Weights   06/04/19 0058  Weight: 91.6 kg   Examination: Physical Exam:  Constitutional: WN/WD obese elderly Caucasian male NAD and appears anxious and slightly uncomfortable Eyes: Lids and conjunctivae normal, sclerae anicteric  ENMT: External Ears, Nose appear normal. Grossly normal hearing. Mucous membranes are moist. Slightly poor dentition  Neck: Appears normal, supple, no cervical masses, normal ROM, no appreciable thyromegaly; no JVD Respiratory: Diminished to auscultation bilaterally with coarse breath sounds and slight crackles but no appreciable no wheezing, rales, rhonchi. Normal respiratory effort and patient is not tachypenic. No accessory muscle use.  Wearing supplemental oxygen via nasal cannula and has been coughing Cardiovascular: RRR, has a 2 out of 6 systolic murmur.  Trace extremity edema.  Abdomen: Soft, non-tender, distended slightly. No masses palpated. No appreciable hepatosplenomegaly. Bowel sounds positive x4.    GU: Deferred. Musculoskeletal: No clubbing / cyanosis of digits/nails. No joint deformity upper and lower extremities.  Skin: No rashes, lesions, ulcers on a limited skin evaluation. No induration; Warm and dry.  Neurologic: CN 2-12 grossly intact with no focal deficits. Romberg sign and cerebellar reflexes not assessed.  Psychiatric: Normal judgment and insight. Alert and awake. Anxious mood and appropriate affect.   Data Reviewed: I have personally reviewed following labs and imaging studies  CBC: Recent Labs  Lab 06/03/19 1725 06/04/19 0231 06/05/19 0827  WBC 18.5* 16.5* 15.1*  NEUTROABS  --   --  12.4*  HGB 10.0* 9.0* 8.8*  HCT 31.4* 27.3* 26.7*  MCV 90.8 89.2 87.0  PLT 234 210 [id number]   Basic Metabolic Panel: Recent Labs  Lab 06/03/19 1725 06/04/19 0231 06/05/19 0827  NA 137 138 140  K 4.5 3.7 3.8  CL 101 103 106  CO2 21* 21* 21*  GLUCOSE 188* 184* 166*  BUN 32* 35* 34*  CREATININE 2.03* 1.75* 1.66*  CALCIUM 8.8* 8.3* 8.4*  MG  --   --  1.4*  PHOS  --   --  2.2*   GFR: Estimated Creatinine Clearance: 37.7 mL/min (A) (by C-G formula based on SCr of 1.66 mg/dL (H)). Liver Function Tests: Recent Labs  Lab 06/04/19 0617 06/05/19 0827  AST 17 24  ALT 13 15  ALKPHOS 56 54  BILITOT 1.0 0.8  PROT 6.3* 6.0*  ALBUMIN 2.8* 2.5*   No results for input(s): LIPASE, AMYLASE in the last 168 hours. No results for input(s): AMMONIA in the last 168 hours. Coagulation Profile: No results for input(s): INR, PROTIME in the last 168 hours. Cardiac Enzymes: No results for input(s): CKTOTAL, CKMB, CKMBINDEX, TROPONINI in the last 168 hours. BNP (last 3 results) No results for input(s): PROBNP in the last 8760 hours. HbA1C: No results for input(s): HGBA1C in the last 72 hours. CBG: Recent Labs  Lab 06/04/19 1242 06/04/19 1718 06/04/19 2128 06/05/19 0717 06/05/19 1232  GLUCAP 167* 174* 209* 152* 155*   Lipid Profile: No results for input(s): CHOL, HDL, LDLCALC, TRIG,  CHOLHDL, LDLDIRECT in the last 72 hours. Thyroid Function Tests: No results for input(s): TSH, T4TOTAL, FREET4, T3FREE, THYROIDAB in the last 72 hours. Anemia Panel: No results for input(s): VITAMINB12, FOLATE, FERRITIN, TIBC, IRON, RETICCTPCT in the last 72 hours. Sepsis Labs: Recent Labs  Lab 06/03/19 2144 06/03/19 2331 06/04/19 0833 06/05/19 0827  PROCALCITON  --   --  0.76 0.73  LATICACIDVEN 2.2* 1.4  --   --     Recent Results (from the past 240 hour(s))  SARS Coronavirus 2 [facility] order, Performed in [facility] hospital lab) Nasopharyngeal Nasopharyngeal Swab     Status: None   Collection Time: 06/03/19  9:34 PM   Specimen: Nasopharyngeal Swab  Result Value Ref Range Status   SARS Coronavirus 2 NEGATIVE NEGATIVE Final    Comment: (NOTE) If result is NEGATIVE SARS-CoV-2 target nucleic acids are NOT DETECTED. The SARS-CoV-2 RNA is generally detectable in upper and lower  respiratory specimens during the acute phase of infection. The lowest  concentration of SARS-CoV-2 viral copies this assay can detect is 250  copies / mL. A negative result does not preclude SARS-CoV-2 infection  and should not be used as the sole basis for treatment or other  patient management decisions.  A negative result may occur with  improper specimen collection / handling, submission of specimen other  than nasopharyngeal swab, presence of viral mutation(s) within the  areas targeted by this assay, and inadequate number of viral copies  (<250 copies / mL). A negative result must be combined with clinical  observations, patient history, and epidemiological information. If result is POSITIVE SARS-CoV-2 target nucleic acids are DETECTED. The SARS-CoV-2 RNA is generally detectable in upper and lower  respiratory specimens dur ing the acute phase of infection.  Positive  results are indicative of active infection with SARS-CoV-2.  Clinical  correlation with patient history and other diagnostic  information is  necessary to determine patient infection status.  Positive results do  not rule out bacterial infection or co-infection with other viruses. If result is PRESUMPTIVE POSTIVE SARS-CoV-2 nucleic acids MAY BE PRESENT.   A presumptive positive result was obtained on the submitted specimen  and confirmed on repeat testing.  While 2019 novel coronavirus  (SARS-CoV-2) nucleic acids may be present in the submitted sample  additional confirmatory testing may be necessary for epidemiological  and /  or clinical management purposes  to differentiate between  SARS-CoV-2 and other Sarbecovirus currently known to infect humans.  If clinically indicated additional testing with an alternate test  methodology [phone]) is advised. The SARS-CoV-2 RNA is generally  detectable in upper and lower respiratory sp ecimens during the acute  phase of infection. The expected result is Negative. Fact Sheet for Patients:  StrictlyIdeas.no Fact Sheet for Healthcare Providers: BankingDealers.co.za This test is not yet approved or cleared by the [place] FDA and has been authorized for detection and/or diagnosis of SARS-CoV-2 by FDA under an Emergency Use Authorization (EUA).  This EUA will remain in effect (meaning this test can be used) for the duration of the COVID-19 declaration under Section 564(b)(1) of the Act, 21 U.S.C. section 360bbb-3(b)(1), unless the authorization is terminated or revoked sooner. Performed at [facility], [place] [address], [place], [place] [address]   Blood Culture (routine x 2)     Status: None (Preliminary result)   Collection Time: 06/03/19  9:41 PM   Specimen: BLOOD RIGHT HAND  Result Value Ref Range Status   Specimen Description BLOOD RIGHT HAND  Final   Special Requests   Final    BOTTLES DRAWN AEROBIC AND ANAEROBIC Blood Culture results may not be optimal due to an inadequate volume of blood received in  culture bottles   Culture   Final    NO GROWTH 2 DAYS Performed at [facility], [place] [address], [place], [place] [address]    Report Status PENDING  Incomplete  Blood Culture (routine x 2)     Status: None (Preliminary result)   Collection Time: 06/03/19  9:44 PM   Specimen: BLOOD  Result Value Ref Range Status   Specimen Description BLOOD LEFT ANTECUBITAL  Final   Special Requests   Final    BOTTLES DRAWN AEROBIC AND ANAEROBIC Blood Culture adequate volume   Culture   Final    NO GROWTH 2 DAYS Performed at [facility], [place] [address] [place], [place] [address]    Report Status PENDING  Incomplete  MRSA PCR Screening     Status: Abnormal   Collection Time: 06/04/19  2:29 AM   Specimen: Aptima Multi Swab; Nasopharyngeal  Result Value Ref Range Status   MRSA by PCR POSITIVE (A) NEGATIVE Final    Comment:        The GeneXpert MRSA Assay (FDA approved for NASAL specimens only), is one component of a comprehensive MRSA colonization surveillance program. It is not intended to diagnose MRSA infection nor to guide or monitor treatment for MRSA infections. RESULT CALLED TO, READ BACK BY AND VERIFIED WITH: A. TOPOLOKI,RN [id number] 06/04/2019 Mena Goes Performed at [facility], [place] [address] [place], [place] [address]   SARS Coronavirus 2 [facility] order, Performed in [facility] hospital lab)     Status: None   Collection Time: 06/04/19  2:29 AM  Result Value Ref Range Status   SARS Coronavirus 2 NEGATIVE NEGATIVE Final    Comment: (NOTE) If result is NEGATIVE SARS-CoV-2 target nucleic acids are NOT DETECTED. The SARS-CoV-2 RNA is generally detectable in upper and lower  respiratory specimens during the acute phase of infection. The lowest  concentration of SARS-CoV-2 viral copies this assay can detect is 250  copies / mL. A negative result does not preclude SARS-CoV-2 infection  and should not be used as the sole basis for treatment or other  patient  management decisions.  A negative result  may occur with  improper specimen collection / handling, submission of specimen other  than nasopharyngeal swab, presence of viral mutation(s) within the  areas targeted by this assay, and inadequate number of viral copies  (<250 copies / mL). A negative result must be combined with clinical  observations, patient history, and epidemiological information. If result is POSITIVE SARS-CoV-2 target nucleic acids are DETECTED. The SARS-CoV-2 RNA is generally detectable in upper and lower  respiratory specimens dur ing the acute phase of infection.  Positive  results are indicative of active infection with SARS-CoV-2.  Clinical  correlation with patient history and other diagnostic information is  necessary to determine patient infection status.  Positive results do  not rule out bacterial infection or co-infection with other viruses. If result is PRESUMPTIVE POSTIVE SARS-CoV-2 nucleic acids MAY BE PRESENT.   A presumptive positive result was obtained on the submitted specimen  and confirmed on repeat testing.  While 2019 novel coronavirus  (SARS-CoV-2) nucleic acids may be present in the submitted sample  additional confirmatory testing may be necessary for epidemiological  and / or clinical management purposes  to differentiate between  SARS-CoV-2 and other Sarbecovirus currently known to infect humans.  If clinically indicated additional testing with an alternate test  methodology [phone]) is advised. The SARS-CoV-2 RNA is generally  detectable in upper and lower respiratory sp ecimens during the acute  phase of infection. The expected result is Negative. Fact Sheet for Patients:  StrictlyIdeas.no Fact Sheet for Healthcare Providers: BankingDealers.co.za This test is not yet approved or cleared by the [place] FDA and has been authorized for detection and/or diagnosis of SARS-CoV-2 by FDA under  an Emergency Use Authorization (EUA).  This EUA will remain in effect (meaning this test can be used) for the duration of the COVID-19 declaration under Section 564(b)(1) of the Act, 21 U.S.C. section 360bbb-3(b)(1), unless the authorization is terminated or revoked sooner. Performed at [facility], [place] [address], [place], [place] [address]   Culture, sputum-assessment     Status: None   Collection Time: 06/04/19  4:35 AM   Specimen: Expectorated Sputum  Result Value Ref Range Status   Specimen Description EXPECTORATED SPUTUM  Final   Special Requests NONE  Final   Sputum evaluation   Final    THIS SPECIMEN IS ACCEPTABLE FOR SPUTUM CULTURE Performed at [facility], [place] [address] [place], [place] [address]    Report Status 06/05/2019 FINAL  Final  Culture, respiratory     Status: None (Preliminary result)   Collection Time: 06/04/19  4:35 AM  Result Value Ref Range Status   Specimen Description EXPECTORATED SPUTUM  Final   Special Requests NONE Reflexed from [id number]  Final   Gram Stain   Final    FEW WBC PRESENT, PREDOMINANTLY PMN MODERATE GRAM POSITIVE COCCI IN PAIRS IN CLUSTERS MODERATE GRAM NEGATIVE RODS RARE GRAM POSITIVE RODS Performed at [facility], [address], [place], [place] [address]    Culture PENDING  Incomplete   Report Status PENDING  Incomplete   Radiology Studies: Dg Chest 2 View  Result Date: 06/03/2019 CLINICAL DATA:  Shortness of breath EXAM: CHEST - 2 VIEW COMPARISON:  02/17/2018 FINDINGS: Cardiomegaly, vascular congestion. Bilateral perihilar and lower lobe opacities could reflect edema or infection. No visible significant effusions or acute bony abnormality. IMPRESSION: Cardiomegaly, vascular congestion. Bilateral perihilar and lower lobe opacities, favor edema although infection is not excluded. Electronically Signed   By: Rolm Baptise M.D.  On: 06/03/2019 18:21   Dg Chest Port 1 View  Result Date: 06/05/2019 CLINICAL DATA:   Shortness of breath. EXAM: PORTABLE CHEST 1 VIEW COMPARISON:  Chest x-ray dated June 03, 2019. FINDINGS: The patient is rotated to the right. Stable cardiomegaly. Bilateral perihilar and lower lobe interstitial opacities have mildly increased. Low lung volumes with mild bibasilar atelectasis. No pleural effusion or pneumothorax. No acute osseous abnormality. IMPRESSION: 1. Bilateral perihilar and lower lobe interstitial opacities which could reflect edema or infection appear mildly progressed, although this could simply reflect increased bronchovascular crowding due to lower lung volumes when compared to prior study. Electronically Signed   By: Titus Dubin M.D.   On: 06/05/2019 09:41   Scheduled Meds:  Chlorhexidine Gluconate Cloth  6 each Topical Q0600   enoxaparin (LOVENOX) injection  40 mg Subcutaneous Q24H   gabapentin  300 mg Oral TID   guaiFENesin  1,200 mg Oral BID   insulin aspart  0-9 Units Subcutaneous TID WC   insulin glargine  45 Units Subcutaneous Daily   mupirocin ointment  1 application Nasal BID   niacin  500 mg Oral QHS   omega-3 acid ethyl esters  1 g Oral BID   phosphorus  500 mg Oral BID   simvastatin  40 mg Oral Daily   Continuous Infusions:  azithromycin Stopped (06/05/19 0700)   cefTRIAXone (ROCEPHIN)  IV Stopped (06/05/19 0700)    LOS: 2 days   Kerney Elbe, DO [facility] PAGER is on AMION  If 7PM-7AM, please contact night-coverage [email or web address] Password [email or web address] 06/05/2019, 4:03 PM

## 2019-06-05 NOTE — Progress Notes (Signed)
  Echocardiogram 2D Echocardiogram has been performed.  Christian [name] 06/05/2019, 5:36 PM

## 2019-06-06 ENCOUNTER — Inpatient Hospital Stay (HOSPITAL_COMMUNITY): Payer: PPO

## 2019-06-06 DIAGNOSIS — I5031 Acute diastolic (congestive) heart failure: Secondary | ICD-10-CM

## 2019-06-06 LAB — CBC WITH DIFFERENTIAL/PLATELET
Abs Immature Granulocytes: 0.15 10*3/uL — ABNORMAL HIGH (ref 0.00–0.07)
Basophils Absolute: 0 10*3/uL (ref 0.0–0.1)
Basophils Relative: 0 %
Eosinophils Absolute: 0.4 10*3/uL (ref 0.0–0.5)
Eosinophils Relative: 2 %
HCT: 27.2 % — ABNORMAL LOW (ref 39.0–52.0)
Hemoglobin: 9 g/dL — ABNORMAL LOW (ref 13.0–17.0)
Immature Granulocytes: 1 %
Lymphocytes Relative: 10 %
Lymphs Abs: 1.4 10*3/uL (ref 0.7–4.0)
MCH: 29 pg (ref 26.0–34.0)
MCHC: 33.1 g/dL (ref 30.0–36.0)
MCV: 87.7 fL (ref 80.0–100.0)
Monocytes Absolute: 1.2 10*3/uL — ABNORMAL HIGH (ref 0.1–1.0)
Monocytes Relative: 8 %
Neutro Abs: 11.5 10*3/uL — ABNORMAL HIGH (ref 1.7–7.7)
Neutrophils Relative %: 79 %
Platelets: 226 10*3/uL (ref 150–400)
RBC: 3.1 MIL/uL — ABNORMAL LOW (ref 4.22–5.81)
RDW: 14.6 % (ref 11.5–15.5)
WBC: 14.6 10*3/uL — ABNORMAL HIGH (ref 4.0–10.5)
nRBC: 0 % (ref 0.0–0.2)

## 2019-06-06 LAB — LEGIONELLA PNEUMOPHILA SEROGP 1 UR AG: L. pneumophila Serogp 1 Ur Ag: NEGATIVE

## 2019-06-06 LAB — COMPREHENSIVE METABOLIC PANEL
ALT: 32 U/L (ref 0–44)
AST: 47 U/L — ABNORMAL HIGH (ref 15–41)
Albumin: 2.3 g/dL — ABNORMAL LOW (ref 3.5–5.0)
Alkaline Phosphatase: 71 U/L (ref 38–126)
Anion gap: 11 (ref 5–15)
BUN: 25 mg/dL — ABNORMAL HIGH (ref 8–23)
CO2: 23 mmol/L (ref 22–32)
Calcium: 8.5 mg/dL — ABNORMAL LOW (ref 8.9–10.3)
Chloride: 106 mmol/L (ref 98–111)
Creatinine, Ser: 1.44 mg/dL — ABNORMAL HIGH (ref 0.61–1.24)
GFR calc Af Amer: 52 mL/min — ABNORMAL LOW (ref >60)
GFR calc non Af Amer: 45 mL/min — ABNORMAL LOW (ref >60)
Glucose, Bld: 166 mg/dL — ABNORMAL HIGH (ref 70–99)
Potassium: 3.9 mmol/L (ref 3.5–5.1)
Sodium: 140 mmol/L (ref 135–145)
Total Bilirubin: 0.6 mg/dL (ref 0.3–1.2)
Total Protein: 5.9 g/dL — ABNORMAL LOW (ref 6.5–8.1)

## 2019-06-06 LAB — RESPIRATORY PANEL BY PCR

## 2019-06-06 LAB — ECHOCARDIOGRAM LIMITED
Height: 68 in
Weight: 3231.06 oz

## 2019-06-06 LAB — PHOSPHORUS: Phosphorus: 3.3 mg/dL (ref 2.5–4.6)

## 2019-06-06 LAB — MAGNESIUM: Magnesium: 1.8 mg/dL (ref 1.7–2.4)

## 2019-06-06 LAB — PROCALCITONIN: Procalcitonin: 0.45 ng/mL

## 2019-06-06 LAB — GLUCOSE, CAPILLARY
Glucose-Capillary: 150 mg/dL — ABNORMAL HIGH (ref 70–99)
Glucose-Capillary: 260 mg/dL — ABNORMAL HIGH (ref 70–99)
Glucose-Capillary: 302 mg/dL — ABNORMAL HIGH (ref 70–99)
Glucose-Capillary: 285 mg/dL — ABNORMAL HIGH (ref 70–99)

## 2019-06-06 MED ORDER — METHYLPREDNISOLONE SODIUM SUCC 40 MG IJ SOLR
40.0000 mg | INTRAMUSCULAR | Status: DC
  Administered 2019-06-06 – 2019-06-08 (×3): 40 mg via INTRAVENOUS
  Filled 2019-06-06 (×3): qty 1

## 2019-06-06 MED ORDER — FUROSEMIDE 10 MG/ML IJ SOLN
20.0000 mg | Freq: Once | INTRAMUSCULAR | Status: AC
  Administered 2019-06-06: 20 mg via INTRAVENOUS
  Filled 2019-06-06: qty 2

## 2019-06-06 NOTE — TOC Initial Note (Signed)
Transition of Care [facility]) - Initial/Assessment Note    Patient Details  Name: Christian [name] MRN: [id number] Date of Birth: 1935/02/16  Transition of Care [facility]) CM/SW Contact:    Geralynn Ochs, LCSW Phone Number: 06/06/2019, 3:48 PM  Clinical Narrative:     CSW attempted to contact patient via phone to discuss SNF recommendation, no answer. CSW contacted patient's daughter, Hilda Blades, to discuss recommendation for SNF. Per Hilda Blades, getting the patient to agree to SNF will be "a battle". Patient is home alone and daughter checks on him 2-3 times a week, but he is stubborn and will not want to go anywhere but home. CSW informed Hilda Blades of how patient performed with PT and OT today, and Hilda Blades says that he hasn't walked good for a long time; he had surgery on his knee recently and has been walking the best he has in years after the surgery was complete, but he's still unsteady and they still worry about him at home. Hilda Blades thinks he is also having some cognitive decline recently, as well. Hilda Blades is going to talk to her brother and both of them try to convince the patient to be agreeable to SNF at discharge. CSW to continue to follow.              Expected Discharge Plan: Skilled Nursing Facility Barriers to Discharge: Continued Medical Work up, Ship broker   Patient Goals and CMS Choice        Expected Discharge Plan and Services Expected Discharge Plan: [place]       Living arrangements for the past 2 months: [place]                                      Prior Living Arrangements/Services Living arrangements for the past 2 months: Single Family Home Lives with:: Self Patient language and need for interpreter reviewed:: No Do you feel safe going back to the place where you live?: Yes      Need for Family Participation in Patient Care: Yes (Comment) Care giver support system in place?: No (comment)   Criminal Activity/Legal Involvement  Pertinent to Current Situation/Hospitalization: No - Comment as needed  Activities of Daily Living Home Assistive Devices/Equipment: Cane (specify quad or straight), Walker (specify type) ADL Screening (condition at time of admission) Patient's cognitive ability adequate to safely complete daily activities?: Yes Is the patient deaf or have difficulty hearing?: Yes Does the patient have difficulty seeing, even when wearing glasses/contacts?: No Does the patient have difficulty concentrating, remembering, or making decisions?: Yes Patient able to express need for assistance with ADLs?: Yes Does the patient have difficulty dressing or bathing?: Yes Independently performs ADLs?: Yes (appropriate for developmental age) Does the patient have difficulty walking or climbing stairs?: Yes Weakness of Legs: Both Weakness of Arms/Hands: None  Permission Sought/Granted                  Emotional Assessment       Orientation: : Oriented to Self, Oriented to Place, Oriented to  Time, Oriented to Situation Alcohol / Substance Use: Not Applicable Psych Involvement: No (comment)  Admission diagnosis:  SIRS (systemic inflammatory response syndrome) ([place]) [R65.10] AKI (acute kidney injury) ([place]) [N17.9] Community acquired pneumonia, unspecified laterality [J18.9] Patient Active Problem List   Diagnosis Date Noted  . ARF (acute renal failure) ([place]) 06/04/2019  . SIRS (systemic inflammatory response syndrome) ([facility])  06/04/2019  . AKI (acute kidney injury) ([place])   . CAP (community acquired pneumonia) 06/03/2019  . Total knee replacement status 04/11/2019  . Diabetes mellitus type 2 with complications ([place]) [id number]  . CKD (chronic kidney disease) stage 3, GFR 30-59 ml/min ([facility]) 09/02/2017  . Primary osteoarthritis of right knee 06/22/2017  . S/P lumbar laminectomy 11/22/2014  . 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 Bilateral lower extremity edema 06/14/2014   . Low back pain 03/13/2014  . Bilateral pseudophakia 06/27/2013  . Diabetic neuropathy ([place])   . Skin cancer of arm 04/20/2013  . HTN (hypertension) 03/03/2013  . Hypercholesterolemia    PCP:  Susy Frizzle, MD Pharmacy:   [facility] [place]), [place] - [address] [place] [address] Phone: [phone] Fax: [phone]     Social Determinants of Health (SDOH) Interventions    Readmission Risk Interventions No flowsheet data found.

## 2019-06-06 NOTE — Progress Notes (Signed)
PROGRESS NOTE    Christian [name]  WUJ:[id number] DOB: 1935/03/03 DOA: 06/03/2019 PCP: Susy Frizzle, MD   Brief Narrative:  The Patient is an 84 year old obese Caucasian male with a past medical history significant for but not limited to diabetes mellitus type 2, hypertension, chronic kidney disease stage III, hyperlipidemia, history o spinal stenosis and neurogenic claudication, as well as arthritis who presented to the hospital with a chief complaint of shortness of breath with productive cough for the last 1 week that have been progressively worsening.  He had gone to his primary care's office and was found to be mildly hypoxic and tachycardic.  EKG showed nonspecific ST-T changes and he was referred to the ER and in the ER he is found to be febrile with blood work showing a leukocytosis and a creatinine that was increased from his baseline of 1.4.  Chest x-ray showed congestion versus infiltrates and blood cultures were obtained.  COVID-19 testing was done twice which was negative started on empiric antibiotics for [facility] pneumonia and admitted for further management.  Upon further review it appears the patient has been taking Lasix as an outpatient and does not have echocardiogram so we will obtain echocardiogram to rule out a heart failure component.  For sepsis secondary to pneumonia and placed on empiric antibiotics.  He also had an acute on chronic kidney disease stage III and was given a small 250 cc bolus and lisinopril and Lasix were held for now.  Because the patient appeared mildly volume overloaded on examination we will obtain an echocardiogram to rule out heart failure component.  Patient's lactic acid trended down and his troponin was relatively flat.  Procalcitonin level of 0.76 on admit and trended down to 0.73.  Patient is AKI on CKD is improving slightly.  MRSA PCR was positive and strep pneumo urinary antigen was negative.   **Interim History Labs are improving but patient  feels about the same.  Echocardiogram done and showed grade 1 diastolic dysfunction.  Electrolytes are improved today.  We will continue treatment for pneumonia and check the patient for respiratory virus panel was negative.  Patient will be transferred off of the [facility] unit to the general medical floor as he is negative for COVID and there is low suspicion given his clinical improvement.  Assessment & Plan:   Principal Problem:   SIRS (systemic inflammatory response syndrome) (HCC) Active Problems:   HTN (hypertension)   CKD (chronic kidney disease) stage 3, GFR 30-59 ml/min (HCC)   Diabetes mellitus type 2 with complications (HCC)   CAP (community acquired pneumonia)   ARF (acute renal failure) (HCC)   AKI (acute kidney injury) ([facility])   Sepsis from PneumoniapoA, improving Acute respiratory failure with hypoxia in the setting of pneumonia and possibly CHF exacerbation -Met sepsis criteria on admission as he was febrile, tachypneic, tachycardic, and had a WBC and a source of infection with a pneumonia -Sepsis physiology is much improving but patient continues to cough back to temperature last night of 101 -Patient placed on empiric antibiotics for community-acquired pneumonia.  -COVID-19 test has been negative twice.  -Follow cultures; blood cultures cultures x2 showed no growth to date at 3 days -Check urine for Legionella strep antigen and this was negative and strep pneumo urine antigen was also negative  -Patient's procalcitonin level was 0.076 on admission and then trended down to 0.45 -WBC is trending down and went from 18.5 is now 14.6 -Repeat chest x-ray this a.m. showed "Slight progression of the  hazy infiltrate at the right lung base. Slight improvement in the faint infiltrates at the left base." -Continue with empiric antibiotics with azithromycin and ceftriaxone for now -Continue with ipratropium-albuterol 1 puff every 6 hours as needed for wheezing shortness of breath -Added  guaifenesin 1200 g p.o. twice daily as well as Tussionex every 12h as needed; patient states he feels about the same and is still coughing. -Checked a respiratory virus panel and this was negative -Continue supplemental oxygen via nasal cannula and wean O2 as tolerated -Continuous pulse oximetry and maintain O2 saturations greater than 92% -We will need an ambulatory home O2 screen prior to discharge -Started the patient on Solu-Medrol 40 mg IV daily as well as given him a one-time dose of IV Lasix 20 mg  -PT OT to evaluate and treat and they are recommending skilled nursing facility -We will need home ambulatory screen prior to discharge and will need to be up and out of bed to chair -Patient continues to cough but states that he is feeling better.  Continue to monitor and follow temperature curve and continue with antibiotic coverage as above  Acute on chronic kidney disease stage III, improving -Was given a 250 cc normal saline bolus.  No further fluids currently -Will hold lisinopril and p.o. Lasix for now. Given a dose of IV Lasix this morning -Decrease patient's gabapentin dose -Patient's BUN/creatinine is improving from 32/2.03 is now trending down and was 25/1.44 -Avoid nephrotoxic medications, contrast dyes as well as hypotension possible -Continue to monitor and trend renal function -Repeat CMP in a.m.  Hypertension  -BP this AM was 151/79 -Holding lisinopril due to acute renal failure.  -We will keep patient on PRN IV hydralazine.  Normocytic Normochromic Anemia/Anemia of Chronic Kidney Disease -Patient's hemoglobin/hematocrit went from 10.0/31.4 on admission is now 9.0/27.2 -Check anemia panel in the a.m. -Continue to monitor for signs and symptoms of bleeding; currently no overt bleeding noted -Likely in the setting of chronic kidney disease -Repeat CBC in a.m.  Hyperlipidemia -C/w simvastatin 40 mg p.o. daily and omega-3 acid ethyl esters 1 g p.o. twice  daily  Recent right knee surgery. -We will get PT/OT to evaluate and treat and they are recommending SNF  Diabetes Mellitus Type 2 on Lantus insulin located by diabetic neuropathy  -Decrease the Lantus dose by 15 units due to acute renal failure.  Currently on 45 units subcu daily  -Also added sensitive and alongside scale insulin before meals -Also decrease gabapentin dose to 300 mg p.o. 3 times daily -Continue to monitor CBGs closely; CBGs are ranging from 150-260 -Continue monitor blood sugars carefully and adjust insulin as necessary  Acute diastolic CHF associated with ardiomegaly and vascular congestion rule -Checked Echocardiogram and showed grade 1 diastolic dysfunction with an EF of 55 to 60% -BNP was 91 -Strict I's and O's, Daily Weights and restrict fluids to 1500 mL's -Patient is -1.160 L since admission; weight is up 9 pounds since admission but question accuracy -Continue to monitor for signs and symptoms of volume overload -We will give 1 dose of IV Lasix 20 mg x 1 -Repeat chest x-ray this a.m. showed "Slight progression of the hazy infiltrate at the right lung base. Slight improvement in the faint infiltrates at the left base."  -Follow response to diuresis  Hypomagnesemia -Patient magnesium level was 1.4 and repeat this AM was 1.8 -Replete with IV mag sulfate 2 g yesterday  -Continue to monitor and replete as necessary -Repeat magnesium level in a.m.  Hypophosphatemia  Palpation phosphorus level this morning was 3.3 -Continue to monitor and replete as necessary -Repeat phosphorus level in a.m.  Obesity -Estimated body mass index is 32.05 kg/m as calculated from the following:   Height as of this encounter: '5\' 8"'$  (1.727 m).   Weight as of this encounter: 95.6 kg. -Weight Loss and Dietary Counseling given   DVT prophylaxis: Enoxaparin 40 mg subcu every 24h Code Status: FULL CODE  Family Communication: No family present at bedside but updated daughter over  the telephone Disposition Plan: Continue further work-up and treatment of his pneumonia and possibly CHF. PT/OT recommending SNF  Consultants:   None   Procedures:  ECHOCARDIOGRAM IMPRESSIONS    1. The left ventricle has normal systolic function, with an ejection fraction of 55-60%. The cavity size was normal. Left ventricular diastolic Doppler parameters are consistent with impaired relaxation. Indeterminate filling pressures No evidence of  left ventricular regional wall motion abnormalities.  2. The right ventricle has normal systolc function. The cavity was normal. There is no increase in right ventricular wall thickness. Right ventricular systolic pressure could not be assessed.  3. The aortic root and ascending aorta are normal in size and structure.  FINDINGS  Left Ventricle: The left ventricle has normal systolic function, with an ejection fraction of 55-60%. The cavity size was normal. There is no increase in left ventricular wall thickness. Left ventricular diastolic Doppler parameters are consistent with  impaired relaxation (grade I). Indeterminate filling pressures No evidence of left ventricular regional wall motion abnormalities.    Right Ventricle: The right ventricle has normal systolic function. The cavity was normal. There is no increase in right ventricular wall thickness. Right ventricular systolic pressure could not be assessed.  Left Atrium: Left atrial size was normal in size.  Right Atrium: Right atrial size was normal in size. Right atrial pressure is estimated at 10 mmHg.  Interatrial Septum: No atrial level shunt detected by color flow Doppler.  Pericardium: There is no evidence of pericardial effusion.  Mitral Valve: The mitral valve is normal in structure. Mitral valve regurgitation is not visualized by color flow Doppler.  Tricuspid Valve: The tricuspid valve was normal in structure. Tricuspid valve regurgitation was not visualized by color  flow Doppler.  Aortic Valve: The aortic valve is normal in structure. Aortic valve regurgitation was not visualized by color flow Doppler.  Aorta: The aortic root and ascending aorta are normal in size and structure.  Venous: The inferior vena cava was not well visualized.    +--------------+-------++  LEFT VENTRICLE           +--------------+-------++  PLAX 2D                  +--------------+-------++  LVIDd:         4.39 cm   +--------------+-------++  LVIDs:         3.01 cm   +--------------+-------++  LV PW:         1.02 cm   +--------------+-------++  LV IVS:        1.01 cm   +--------------+-------++  LV SV:         52 ml     +--------------+-------++  LV SV Index:   24.46     +--------------+-------++                           +--------------+-------++  +-----------+-------++----------++  LEFT ATRIUM          Index        +-----------+-------++----------++  LA diam:    3.50 cm  1.71 cm/m   +-----------+-------++----------++    +-------------+-------++  AORTA                   +-------------+-------++  Ao Root diam: 3.10 cm   +-------------+-------++  +--------------+----------++  MITRAL VALVE                +--------------+----------++  MV Area (PHT): 4.68 cm     +--------------+----------++  MV PHT:        46.98 msec   +--------------+----------++  MV Decel Time: 162 msec     +--------------+----------++ +--------------+-----------++  MV E velocity: 81.50 cm/s    +--------------+-----------++  MV A velocity: 112.00 cm/s   +--------------+-----------++  MV E/A ratio:  0.73          +--------------+-----------++   Antimicrobials:  Anti-infectives (From admission, onward)   Start     Dose/Rate Route Frequency Ordered Stop   06/04/19 1800  cefTRIAXone (ROCEPHIN) 2 g in sodium chloride 0.9 % 100 mL IVPB     2 g 200 mL/hr over 30 Minutes Intravenous Every 24 hours 06/04/19 0122 06/09/19 1759   06/04/19 1800  azithromycin (ZITHROMAX) 500 mg  in sodium chloride 0.9 % 250 mL IVPB     500 mg 250 mL/hr over 60 Minutes Intravenous Every 24 hours 06/04/19 0122 06/09/19 1759   06/03/19 2045  cefTRIAXone (ROCEPHIN) 1 g in sodium chloride 0.9 % 100 mL IVPB     1 g 200 mL/hr over 30 Minutes Intravenous  Once 06/03/19 2044 06/03/19 2351   06/03/19 2045  azithromycin (ZITHROMAX) 500 mg in sodium chloride 0.9 % 250 mL IVPB     500 mg 250 mL/hr over 60 Minutes Intravenous  Once 06/03/19 2044 06/03/19 2351     Subjective: Seen and examined at bedside states he is feeling better and asking about when he can go home.  Denies any nausea or vomiting.  Continues to cough but states that it does not bother him.  No lightheadedness or dizziness.  No chest pain, no other concerns or complaints at this time.  Objective: Vitals:   06/05/19 2226 06/05/19 2350 06/06/19 0746 06/06/19 0953  BP: (!) 141/59  (!) 151/79   Pulse: 80  88   Resp: (!) 24 20 (!) 22   Temp: (!) 101 F (38.3 C) 99.3 F (37.4 C) 98.5 F (36.9 C)   TempSrc: Oral Oral Oral   SpO2: 95%  96%   Weight:    95.6 kg  Height:        Intake/Output Summary (Last 24 hours) at 06/06/2019 1517 Last data filed at 06/06/2019 0957 Gross per 24 hour  Intake 800 ml  Output 1750 ml  Net -950 ml   Filed Weights   06/04/19 0058 06/06/19 0953  Weight: 91.6 kg 95.6 kg   Examination: Physical Exam:  Constitutional: Well-nourished, well-developed obese elderly Caucasian male currently no acute distress appears anxious and is coughing and slightly uncomfortable and he is improved from yesterday states Eyes: Lids and conjunctive are normal.  Sclera anicteric ENMT: External ears and nose appear normal.  Slightly poor dentition.  Grossly normal hearing Neck: Appears supple no JVD Respiratory: Diminished auscultation bilaterally no appreciable wheezing, rales, rhonchi but does have some coarse breath sounds and mild crackles.  Normal respiratory effort and he is not tachypneic but he is wearing  supplemental oxygen via nasal cannula.  Still continues to cough but nonproductive cough and almost sounds like a whooping  cough Cardiovascular: Has a regular rate and rhythm.  Has a 2 out of 6 systolic murmur.  No appreciable extremity edema today Abdomen: Soft, nontender, distended slightly.  Bowel sounds present GU: Deferred Musculoskeletal: No contractures or cyanosis.  No joint deformities in upper lower extremeties Skin: No appreciable rashes or lesions limited skin evaluation. Neurologic: Cranial nerves II through XII gross intact no appreciable focal deficits Psychiatric: Normal judgment insight.  Patient is awake, alert, slightly anxious  Data Reviewed: I have personally reviewed following labs and imaging studies  CBC: Recent Labs  Lab 06/03/19 1725 06/04/19 0231 06/05/19 0827 06/06/19 0701  WBC 18.5* 16.5* 15.1* 14.6*  NEUTROABS  --   --  12.4* 11.5*  HGB 10.0* 9.0* 8.8* 9.0*  HCT 31.4* 27.3* 26.7* 27.2*  MCV 90.8 89.2 87.0 87.7  PLT 234 210 205 540   Basic Metabolic Panel: Recent Labs  Lab 06/03/19 1725 06/04/19 0231 06/05/19 0827 06/06/19 0701  NA 137 138 140 140  K 4.5 3.7 3.8 3.9  CL 101 103 106 106  CO2 21* 21* 21* 23  GLUCOSE 188* 184* 166* 166*  BUN 32* 35* 34* 25*  CREATININE 2.03* 1.75* 1.66* 1.44*  CALCIUM 8.8* 8.3* 8.4* 8.5*  MG  --   --  1.4* 1.8  PHOS  --   --  2.2* 3.3   GFR: Estimated Creatinine Clearance: 44.4 mL/min (A) (by C-G formula based on SCr of 1.44 mg/dL (H)). Liver Function Tests: Recent Labs  Lab 06/04/19 0617 06/05/19 0827 06/06/19 0701  AST 17 24 47*  ALT 13 15 32  ALKPHOS 56 54 71  BILITOT 1.0 0.8 0.6  PROT 6.3* 6.0* 5.9*  ALBUMIN 2.8* 2.5* 2.3*   No results for input(s): LIPASE, AMYLASE in the last 168 hours. No results for input(s): AMMONIA in the last 168 hours. Coagulation Profile: No results for input(s): INR, PROTIME in the last 168 hours. Cardiac Enzymes: No results for input(s): CKTOTAL, CKMB, CKMBINDEX,  TROPONINI in the last 168 hours. BNP (last 3 results) No results for input(s): PROBNP in the last 8760 hours. HbA1C: No results for input(s): HGBA1C in the last 72 hours. CBG: Recent Labs  Lab 06/05/19 1658 06/05/19 1659 06/05/19 2223 06/06/19 0734 06/06/19 1151  GLUCAP 159* 157* 191* 150* 260*   Lipid Profile: No results for input(s): CHOL, HDL, LDLCALC, TRIG, CHOLHDL, LDLDIRECT in the last 72 hours. Thyroid Function Tests: No results for input(s): TSH, T4TOTAL, FREET4, T3FREE, THYROIDAB in the last 72 hours. Anemia Panel: No results for input(s): VITAMINB12, FOLATE, FERRITIN, TIBC, IRON, RETICCTPCT in the last 72 hours. Sepsis Labs: Recent Labs  Lab 06/03/19 2144 06/03/19 2331 06/04/19 0833 06/05/19 0827 06/06/19 0701  PROCALCITON  --   --  0.76 0.73 0.45  LATICACIDVEN 2.2* 1.4  --   --   --     Recent Results (from the past 240 hour(s))  SARS Coronavirus 2 [facility] order, Performed in [facility] hospital lab) Nasopharyngeal Nasopharyngeal Swab     Status: None   Collection Time: 06/03/19  9:34 PM   Specimen: Nasopharyngeal Swab  Result Value Ref Range Status   SARS Coronavirus 2 NEGATIVE NEGATIVE Final    Comment: (NOTE) If result is NEGATIVE SARS-CoV-2 target nucleic acids are NOT DETECTED. The SARS-CoV-2 RNA is generally detectable in upper and lower  respiratory specimens during the acute phase of infection. The lowest  concentration of SARS-CoV-2 viral copies this assay can detect is 250  copies / mL. A negative result does not  preclude SARS-CoV-2 infection  and should not be used as the sole basis for treatment or other  patient management decisions.  A negative result may occur with  improper specimen collection / handling, submission of specimen other  than nasopharyngeal swab, presence of viral mutation(s) within the  areas targeted by this assay, and inadequate number of viral copies  (<250 copies / mL). A negative result must be combined with clinical   observations, patient history, and epidemiological information. If result is POSITIVE SARS-CoV-2 target nucleic acids are DETECTED. The SARS-CoV-2 RNA is generally detectable in upper and lower  respiratory specimens dur ing the acute phase of infection.  Positive  results are indicative of active infection with SARS-CoV-2.  Clinical  correlation with patient history and other diagnostic information is  necessary to determine patient infection status.  Positive results do  not rule out bacterial infection or co-infection with other viruses. If result is PRESUMPTIVE POSTIVE SARS-CoV-2 nucleic acids MAY BE PRESENT.   A presumptive positive result was obtained on the submitted specimen  and confirmed on repeat testing.  While 2019 novel coronavirus  (SARS-CoV-2) nucleic acids may be present in the submitted sample  additional confirmatory testing may be necessary for epidemiological  and / or clinical management purposes  to differentiate between  SARS-CoV-2 and other Sarbecovirus currently known to infect humans.  If clinically indicated additional testing with an alternate test  methodology [phone]) is advised. The SARS-CoV-2 RNA is generally  detectable in upper and lower respiratory sp ecimens during the acute  phase of infection. The expected result is Negative. Fact Sheet for Patients:  StrictlyIdeas.no Fact Sheet for Healthcare Providers: BankingDealers.co.za This test is not yet approved or cleared by the [place] FDA and has been authorized for detection and/or diagnosis of SARS-CoV-2 by FDA under an Emergency Use Authorization (EUA).  This EUA will remain in effect (meaning this test can be used) for the duration of the COVID-19 declaration under Section 564(b)(1) of the Act, 21 U.S.C. section 360bbb-3(b)(1), unless the authorization is terminated or revoked sooner. Performed at [facility], [place] [address],  [place], [place] [address]   Blood Culture (routine x 2)     Status: None (Preliminary result)   Collection Time: 06/03/19  9:41 PM   Specimen: BLOOD RIGHT HAND  Result Value Ref Range Status   Specimen Description BLOOD RIGHT HAND  Final   Special Requests   Final    BOTTLES DRAWN AEROBIC AND ANAEROBIC Blood Culture results may not be optimal due to an inadequate volume of blood received in culture bottles   Culture   Final    NO GROWTH 3 DAYS Performed at [facility], [place] [address], [place], [place] [address]    Report Status PENDING  Incomplete  Blood Culture (routine x 2)     Status: None (Preliminary result)   Collection Time: 06/03/19  9:44 PM   Specimen: BLOOD  Result Value Ref Range Status   Specimen Description BLOOD LEFT ANTECUBITAL  Final   Special Requests   Final    BOTTLES DRAWN AEROBIC AND ANAEROBIC Blood Culture adequate volume   Culture   Final    NO GROWTH 3 DAYS Performed at [facility], [place] [address], [place],  [address]    Report Status PENDING  Incomplete  MRSA PCR Screening     Status: Abnormal   Collection Time: 06/04/19  2:29 AM   Specimen: Aptima Multi Swab; Nasopharyngeal  Result Value  Ref Range Status   MRSA by PCR POSITIVE (A) NEGATIVE Final    Comment:        The GeneXpert MRSA Assay (FDA approved for NASAL specimens only), is one component of a comprehensive MRSA colonization surveillance program. It is not intended to diagnose MRSA infection nor to guide or monitor treatment for MRSA infections. RESULT CALLED TO, READ BACK BY AND VERIFIED WITH: A. TOPOLOKI,RN 0370 06/04/2019 Mena Goes Performed at [facility], [place] [address], [place], [place] [address]   SARS Coronavirus 2 [facility] order, Performed in [facility] hospital lab)     Status: None   Collection Time: 06/04/19  2:29 AM  Result Value Ref Range Status   SARS Coronavirus 2 NEGATIVE NEGATIVE Final    Comment: (NOTE) If result is NEGATIVE SARS-CoV-2  target nucleic acids are NOT DETECTED. The SARS-CoV-2 RNA is generally detectable in upper and lower  respiratory specimens during the acute phase of infection. The lowest  concentration of SARS-CoV-2 viral copies this assay can detect is 250  copies / mL. A negative result does not preclude SARS-CoV-2 infection  and should not be used as the sole basis for treatment or other  patient management decisions.  A negative result may occur with  improper specimen collection / handling, submission of specimen other  than nasopharyngeal swab, presence of viral mutation(s) within the  areas targeted by this assay, and inadequate number of viral copies  (<250 copies / mL). A negative result must be combined with clinical  observations, patient history, and epidemiological information. If result is POSITIVE SARS-CoV-2 target nucleic acids are DETECTED. The SARS-CoV-2 RNA is generally detectable in upper and lower  respiratory specimens dur ing the acute phase of infection.  Positive  results are indicative of active infection with SARS-CoV-2.  Clinical  correlation with patient history and other diagnostic information is  necessary to determine patient infection status.  Positive results do  not rule out bacterial infection or co-infection with other viruses. If result is PRESUMPTIVE POSTIVE SARS-CoV-2 nucleic acids MAY BE PRESENT.   A presumptive positive result was obtained on the submitted specimen  and confirmed on repeat testing.  While 2019 novel coronavirus  (SARS-CoV-2) nucleic acids may be present in the submitted sample  additional confirmatory testing may be necessary for epidemiological  and / or clinical management purposes  to differentiate between  SARS-CoV-2 and other Sarbecovirus currently known to infect humans.  If clinically indicated additional testing with an alternate test  methodology [phone]) is advised. The SARS-CoV-2 RNA is generally  detectable in upper and lower  respiratory sp ecimens during the acute  phase of infection. The expected result is Negative. Fact Sheet for Patients:  StrictlyIdeas.no Fact Sheet for Healthcare Providers: BankingDealers.co.za This test is not yet approved or cleared by the [place] FDA and has been authorized for detection and/or diagnosis of SARS-CoV-2 by FDA under an Emergency Use Authorization (EUA).  This EUA will remain in effect (meaning this test can be used) for the duration of the COVID-19 declaration under Section 564(b)(1) of the Act, 21 U.S.C. section 360bbb-3(b)(1), unless the authorization is terminated or revoked sooner. Performed at [facility], [place] [address], [place], [place] [address]   Culture, sputum-assessment     Status: None   Collection Time: 06/04/19  4:35 AM   Specimen: Expectorated Sputum  Result Value Ref Range Status   Specimen Description EXPECTORATED SPUTUM  Final   Special Requests NONE  Final  Sputum evaluation   Final    THIS SPECIMEN IS ACCEPTABLE FOR SPUTUM CULTURE Performed at [facility], [place] [address] [place], [place] [address]    Report Status 06/05/2019 FINAL  Final  Culture, respiratory     Status: None (Preliminary result)   Collection Time: 06/04/19  4:35 AM  Result Value Ref Range Status   Specimen Description EXPECTORATED SPUTUM  Final   Special Requests NONE Reflexed from [id number]  Final   Gram Stain   Final    FEW WBC PRESENT, PREDOMINANTLY PMN MODERATE GRAM POSITIVE COCCI IN PAIRS IN CLUSTERS MODERATE GRAM NEGATIVE RODS RARE GRAM POSITIVE RODS    Culture   Final    CULTURE REINCUBATED FOR BETTER GROWTH Performed at [facility], [place] [address], [place], [place] [address]    Report Status PENDING  Incomplete  Respiratory Panel by PCR     Status: None   Collection Time: 06/06/19 10:34 AM   Specimen: Nasopharyngeal Swab; Respiratory  Result Value Ref Range Status   Adenovirus NOT  DETECTED NOT DETECTED Final   Coronavirus 229E NOT DETECTED NOT DETECTED Final    Comment: (NOTE) The Coronavirus on the Respiratory Panel, DOES NOT test for the novel  Coronavirus (2019 nCoV)    Coronavirus HKU1 NOT DETECTED NOT DETECTED Final   Coronavirus NL63 NOT DETECTED NOT DETECTED Final   Coronavirus OC43 NOT DETECTED NOT DETECTED Final   Metapneumovirus NOT DETECTED NOT DETECTED Final   Rhinovirus / Enterovirus NOT DETECTED NOT DETECTED Final   Influenza A NOT DETECTED NOT DETECTED Final   Influenza B NOT DETECTED NOT DETECTED Final   Parainfluenza Virus 1 NOT DETECTED NOT DETECTED Final   Parainfluenza Virus 2 NOT DETECTED NOT DETECTED Final   Parainfluenza Virus 3 NOT DETECTED NOT DETECTED Final   Parainfluenza Virus 4 NOT DETECTED NOT DETECTED Final   Respiratory Syncytial Virus NOT DETECTED NOT DETECTED Final   Bordetella pertussis NOT DETECTED NOT DETECTED Final   Chlamydophila pneumoniae NOT DETECTED NOT DETECTED Final   Mycoplasma pneumoniae NOT DETECTED NOT DETECTED Final    Comment: Performed at [facility], [place]. [address], [place],  [address]   Radiology Studies: Dg Chest Port 1 View  Result Date: 06/06/2019 CLINICAL DATA:  Shortness of breath. COVID-19. EXAM: PORTABLE CHEST 1 VIEW COMPARISON:  06/05/2019 and 06/03/2019 FINDINGS: The heart size and pulmonary vascularity are normal. There has been slight progression of the hazy infiltrate at the right lung base. Faint infiltrates in the right midzone peripherally the left base are minimal, improved at the left base. No effusions. No acute bone abnormality. IMPRESSION: Slight progression of the hazy infiltrate at the right lung base. Slight improvement in the faint infiltrates at the left base. Electronically Signed   By: Lorriane Shire M.D.   On: 06/06/2019 08:12   Dg Chest Port 1 View  Result Date: 06/05/2019 CLINICAL DATA:  Shortness of breath. EXAM: PORTABLE CHEST 1 VIEW COMPARISON:  Chest x-ray dated  June 03, 2019. FINDINGS: The patient is rotated to the right. Stable cardiomegaly. Bilateral perihilar and lower lobe interstitial opacities have mildly increased. Low lung volumes with mild bibasilar atelectasis. No pleural effusion or pneumothorax. No acute osseous abnormality. IMPRESSION: 1. Bilateral perihilar and lower lobe interstitial opacities which could reflect edema or infection appear mildly progressed, although this could simply reflect increased bronchovascular crowding due to lower lung volumes when compared to prior study. Electronically Signed   By: Orville Govern.D.  On: 06/05/2019 09:41   Scheduled Meds:  Chlorhexidine Gluconate Cloth  6 each Topical Q0600   enoxaparin (LOVENOX) injection  40 mg Subcutaneous Q24H   gabapentin  300 mg Oral TID   guaiFENesin  1,200 mg Oral BID   insulin aspart  0-9 Units Subcutaneous TID WC   insulin glargine  45 Units Subcutaneous Daily   methylPREDNISolone (SOLU-MEDROL) injection  40 mg Intravenous Q24H   mupirocin ointment  1 application Nasal BID   niacin  500 mg Oral QHS   omega-3 acid ethyl esters  1 g Oral BID   simvastatin  40 mg Oral Daily   Continuous Infusions:  azithromycin 500 mg (06/05/19 1657)   cefTRIAXone (ROCEPHIN)  IV 2 g (06/05/19 1620)    LOS: 3 days   Kerney Elbe, DO [facility] PAGER is on AMION  If 7PM-7AM, please contact night-coverage [email or web address] Password [facility] 06/06/2019, 3:17 PM

## 2019-06-06 NOTE — Progress Notes (Signed)
Pt transferred to room 2W-21. Tele monitoring aware. Oriented to room. Call bell within reach.

## 2019-06-07 ENCOUNTER — Inpatient Hospital Stay (HOSPITAL_COMMUNITY): Payer: PPO

## 2019-06-07 DIAGNOSIS — J9601 Acute respiratory failure with hypoxia: Secondary | ICD-10-CM

## 2019-06-07 LAB — CBC WITH DIFFERENTIAL/PLATELET
Abs Immature Granulocytes: 0.23 10*3/uL — ABNORMAL HIGH (ref 0.00–0.07)
Basophils Absolute: 0 10*3/uL (ref 0.0–0.1)
Basophils Relative: 0 %
Eosinophils Absolute: 0 10*3/uL (ref 0.0–0.5)
Eosinophils Relative: 0 %
HCT: 26.4 % — ABNORMAL LOW (ref 39.0–52.0)
Hemoglobin: 8.9 g/dL — ABNORMAL LOW (ref 13.0–17.0)
Immature Granulocytes: 2 %
Lymphocytes Relative: 8 %
Lymphs Abs: 1.1 10*3/uL (ref 0.7–4.0)
MCH: 29 pg (ref 26.0–34.0)
MCHC: 33.7 g/dL (ref 30.0–36.0)
MCV: 86 fL (ref 80.0–100.0)
Monocytes Absolute: 0.8 10*3/uL (ref 0.1–1.0)
Monocytes Relative: 6 %
Neutro Abs: 11.3 10*3/uL — ABNORMAL HIGH (ref 1.7–7.7)
Neutrophils Relative %: 84 %
Platelets: 235 10*3/uL (ref 150–400)
RBC: 3.07 MIL/uL — ABNORMAL LOW (ref 4.22–5.81)
RDW: 14.3 % (ref 11.5–15.5)
WBC: 13.4 10*3/uL — ABNORMAL HIGH (ref 4.0–10.5)
nRBC: 0 % (ref 0.0–0.2)

## 2019-06-07 LAB — GLUCOSE, CAPILLARY
Glucose-Capillary: 214 mg/dL — ABNORMAL HIGH (ref 70–99)
Glucose-Capillary: 283 mg/dL — ABNORMAL HIGH (ref 70–99)
Glucose-Capillary: 356 mg/dL — ABNORMAL HIGH (ref 70–99)
Glucose-Capillary: 445 mg/dL — ABNORMAL HIGH (ref 70–99)

## 2019-06-07 LAB — IRON AND TIBC
Iron: 26 ug/dL — ABNORMAL LOW (ref 45–182)
Saturation Ratios: 16 % — ABNORMAL LOW (ref 17.9–39.5)
TIBC: 167 ug/dL — ABNORMAL LOW (ref 250–450)
UIBC: 141 ug/dL

## 2019-06-07 LAB — PHOSPHORUS: Phosphorus: 3.5 mg/dL (ref 2.5–4.6)

## 2019-06-07 LAB — COMPREHENSIVE METABOLIC PANEL
ALT: 43 U/L (ref 0–44)
AST: 43 U/L — ABNORMAL HIGH (ref 15–41)
Albumin: 2.3 g/dL — ABNORMAL LOW (ref 3.5–5.0)
Alkaline Phosphatase: 77 U/L (ref 38–126)
Anion gap: 11 (ref 5–15)
BUN: 29 mg/dL — ABNORMAL HIGH (ref 8–23)
CO2: 22 mmol/L (ref 22–32)
Calcium: 8.7 mg/dL — ABNORMAL LOW (ref 8.9–10.3)
Chloride: 104 mmol/L (ref 98–111)
Creatinine, Ser: 1.29 mg/dL — ABNORMAL HIGH (ref 0.61–1.24)
GFR calc Af Amer: 59 mL/min — ABNORMAL LOW (ref >60)
GFR calc non Af Amer: 51 mL/min — ABNORMAL LOW (ref >60)
Glucose, Bld: 265 mg/dL — ABNORMAL HIGH (ref 70–99)
Potassium: 4.2 mmol/L (ref 3.5–5.1)
Sodium: 137 mmol/L (ref 135–145)
Total Bilirubin: 0.6 mg/dL (ref 0.3–1.2)
Total Protein: 6.1 g/dL — ABNORMAL LOW (ref 6.5–8.1)

## 2019-06-07 LAB — RETICULOCYTES
Immature Retic Fract: 22.8 % — ABNORMAL HIGH (ref 2.3–15.9)
RBC.: 3.07 MIL/uL — ABNORMAL LOW (ref 4.22–5.81)
Retic Count, Absolute: 19.3 10*3/uL (ref 19.0–186.0)
Retic Ct Pct: 0.6 % (ref 0.4–3.1)

## 2019-06-07 LAB — FOLATE: Folate: 6.4 ng/mL (ref >5.9)

## 2019-06-07 LAB — CULTURE, RESPIRATORY W GRAM STAIN: Culture: NORMAL

## 2019-06-07 LAB — MAGNESIUM: Magnesium: 1.7 mg/dL (ref 1.7–2.4)

## 2019-06-07 LAB — VITAMIN B12: Vitamin B-12: 3072 pg/mL — ABNORMAL HIGH (ref 180–914)

## 2019-06-07 LAB — FERRITIN: Ferritin: 693 ng/mL — ABNORMAL HIGH (ref 24–336)

## 2019-06-07 MED ORDER — INSULIN ASPART 100 UNIT/ML ~~LOC~~ SOLN
9.0000 [IU] | Freq: Once | SUBCUTANEOUS | Status: AC
  Administered 2019-06-07: 9 [IU] via SUBCUTANEOUS

## 2019-06-07 MED ORDER — FUROSEMIDE 10 MG/ML IJ SOLN
20.0000 mg | Freq: Once | INTRAMUSCULAR | Status: AC
  Administered 2019-06-07: 20 mg via INTRAVENOUS
  Filled 2019-06-07: qty 2

## 2019-06-07 MED ORDER — BENZONATATE 100 MG PO CAPS
200.0000 mg | ORAL_CAPSULE | Freq: Three times a day (TID) | ORAL | Status: DC | PRN
  Administered 2019-06-08: 200 mg via ORAL
  Filled 2019-06-07: qty 2

## 2019-06-07 MED ORDER — MAGNESIUM SULFATE 2 GM/50ML IV SOLN
2.0000 g | Freq: Once | INTRAVENOUS | Status: AC
  Administered 2019-06-07: 2 g via INTRAVENOUS
  Filled 2019-06-07: qty 50

## 2019-06-07 NOTE — Progress Notes (Signed)
PROGRESS NOTE    Christian [name]  YCX:[id number] DOB: 09/17/1935 DOA: 06/03/2019 PCP: Susy Frizzle, MD   Brief Narrative:  The Patient is an 84 year old obese Caucasian male with a past medical history significant for but not limited to diabetes mellitus type 2, hypertension, chronic kidney disease stage III, hyperlipidemia, history o spinal stenosis and neurogenic claudication, as well as arthritis who presented to the hospital with a chief complaint of shortness of breath with productive cough for the last 1 week that have been progressively worsening.  He had gone to his primary care's office and was found to be mildly hypoxic and tachycardic.  EKG showed nonspecific ST-T changes and he was referred to the ER and in the ER he is found to be febrile with blood work showing a leukocytosis and a creatinine that was increased from his baseline of 1.4.  Chest x-ray showed congestion versus infiltrates and blood cultures were obtained.  COVID-19 testing was done twice which was negative started on empiric antibiotics for [facility] pneumonia and admitted for further management.  Upon further review it appears the patient has been taking Lasix as an outpatient and does not have echocardiogram so we will obtain echocardiogram to rule out a heart failure component.  For sepsis secondary to pneumonia and placed on empiric antibiotics.  He also had an acute on chronic kidney disease stage III and was given a small 250 cc bolus and lisinopril and Lasix were held for now.  Because the patient appeared mildly volume overloaded on examination we will obtain an echocardiogram to rule out heart failure component.  Patient's lactic acid trended down and his troponin was relatively flat.  Procalcitonin level of 0.76 on admit and trended down to 0.73.  Patient is AKI on CKD is improving .  MRSA PCR was positive and strep pneumo urinary antigen was negative.   **Interim History Labs are improving today the patient  feels a little bit better. Echocardiogram done and showed grade 1 diastolic dysfunction.  Electrolytes are improved today.  We will continue treatment for pneumonia and check the patient for respiratory virus panel this was was negative.  Patient was transferred off of the [facility] unit to the general medical floor as he is negative for COVID and there is low suspicion given his clinical improvement.  Patient continues to improve daily and Lasix help so we will give another dose.  Will have PT OT reevaluate given his weakness but anticipate discharge home with home health tomorrow as patient is continuing to refuse SNF placement currently  Assessment & Plan:   Principal Problem:   SIRS (systemic inflammatory response syndrome) (HCC) Active Problems:   HTN (hypertension)   CKD (chronic kidney disease) stage 3, GFR 30-59 ml/min (HCC)   Diabetes mellitus type 2 with complications ([place])   CAP (community acquired pneumonia)   ARF (acute renal failure) (HCC)   AKI (acute kidney injury) ([place])   Sepsis from PneumoniapoA, improving Acute respiratory failure with hypoxia in the setting of pneumonia and possibly CHF exacerbation, improved -Met sepsis criteria on admission as he was febrile, tachypneic, tachycardic, and had a WBC and a source of infection with a pneumonia -Sepsis physiology is much improving but patient continues to cough back to temperature last night of 101 -Patient placed on empiric antibiotics for community-acquired pneumonia.  -COVID-19 test has been negative twice.  -Follow cultures; blood cultures cultures x2 showed no growth to date at 3 days -Check urine for Legionella strep antigen and this was  negative and strep pneumo urine antigen was also negative  -Patient's procalcitonin level was 0.076 on admission and then trended down to 0.45 -WBC is trending down and went from 18.5 is now 13.4 and improving daily -Repeat chest x-ray this a.m. showed "Cardiac shadow is enlarged but  stable. The lungs are well aerated bilaterally with improved aeration in the bases bilaterally although some minimal residual density remains. No bony abnormality is seen." -Continue with empiric antibiotics with azithromycin and ceftriaxone for now as patient is improving  -Continue with ipratropium-albuterol 1 puff every 6 hours as needed for wheezing shortness of breath -Added guaifenesin 1200 g p.o. twice daily as well as Tussionex every 12h as needed; patient states he feels about the same and is still coughing. -Checked a respiratory virus panel and this was negative -Continue supplemental oxygen via nasal cannula and wean O2 as tolerated; Currently no longer on oxygen -Continuous pulse oximetry and maintain O2 saturations greater than 92% -We will need an ambulatory home O2 screen prior to discharge -Started the patient on Solu-Medrol 40 mg IV daily as well as given him a one-time dose of IV Lasix 20 mg yesterday We will repeat dose of IV Lasix today and continue on Solu-Medrol and transition to a prednisone taper at discharge -PT OT to evaluate and treat and they are recommending skilled nursing facility but patient is adamantly refusing so we will have PT OT reevaluate to see which appointment he will need as he wants to go home -We will need home ambulatory screen prior to discharge and will need to be up and out of bed to chair; will need to obtain amatory home O2 screen now -Currently the patient has been weaned to room air and saturating 92% -Patient continues to cough but states that he is feeling better.  Continue to monitor and follow temperature curve and continue with antibiotic coverage as above  Acute on chronic kidney disease stage III, improving -Was given a 250 cc normal saline bolus.  No further fluids currently -Will hold lisinopril and p.o. Lasix for now. Given a dose of IV Lasix again this morning -Decrease patient's gabapentin dose -Patient's BUN/creatinine is  improving from 32/2.03 is now trending down and was 29/1.29 -Avoid nephrotoxic medications, contrast dyes as well as hypotension possible -Continue to monitor and trend renal function -Repeat CMP in a.m.  Hypertension  -BP this AM was 157/58 -Holding lisinopril due to acute renal failure.  -We will keep patient on PRN IV hydralazine.  Normocytic Normochromic Anemia/Anemia of Chronic Kidney Disease -Patient's hemoglobin/hematocrit went from 10.0/31.4 on admission is now 8.9/26.4 -Checked Anemia Panel and showed an iron level of 26, U IBC of 141, TIBC 167, saturation ratios of 16%, ferritin level 693, folate level 6.4, and vitamin B12 low 3072 -Continue to monitor for signs and symptoms of bleeding; currently no overt bleeding noted -Likely in the setting of chronic kidney disease -Repeat CBC in a.m.  Hyperlipidemia -C/w Simvastatin 40 mg p.o. daily and omega-3 acid ethyl esters 1 g p.o. twice daily  Recent right knee surgery. -We will get PT/OT to evaluate and treat and they are recommending SNF; Will have them re-evaluate for Equipment as he is adamantly refusing SNF  Diabetes Mellitus Type 2 on Lantus insulin located by diabetic neuropathy  -Decrease the Lantus dose by 15 units due to acute renal failure.  Currently on 45 units subcu daily  -Also added sensitive and alongside scale insulin before meals -Also decrease gabapentin dose to 300 mg p.o.  3 times daily -Continue to monitor CBGs closely; CBGs are ranging from 214-283 -Continue monitor blood sugars carefully and adjust insulin as necessary  Acute diastolic CHF associated with Cardiomegaly and vascular congestion, improving  -Checked Echocardiogram and showed grade 1 diastolic dysfunction with an EF of 55 to 60% -BNP was 91 -Strict I's and O's, Daily Weights and restrict fluids to 1500 mL's -Patient is -3,420 L since admission; weight is up 9 pounds since admission but question accuracy but repeat not done -Continue  to monitor for signs and symptoms of volume overload -Was given 1 dose of IV Lasix 20 mg yesterday and repeat  -Repeat chest x-ray this a.m. showed "Slight progression of the hazy infiltrate at the right lung base. Slight improvement in the faint infiltrates at the left base."  -Follow response to diuresis and he is definitely improving   Hypomagnesemia -Patient magnesium level was 1.4 and repeat this AM was 17 -Replete with IV mag sulfate 2 g again  -Continue to monitor and replete as necessary -Repeat magnesium level in a.m.  Hypophosphatemia, improved  Palpation phosphorus level this morning was 3.5 -Continue to monitor and replete as necessary -Repeat phosphorus level in a.m.  Obesity -Estimated body mass index is 32.05 kg/m as calculated from the following:   Height as of this encounter: '5\' 8"'$  (1.727 m).   Weight as of this encounter: 95.6 kg. -Weight Loss and Dietary Counseling given   Elevated AST -Mild and is improving -Patient's AST went from 47 is now 43 -Continue monitor and trend repeat CMP in a.m. and necessary will obtain a right upper quadrant ultrasound and acute hepatitis panel  DVT prophylaxis: Enoxaparin 40 mg subcu every 24h Code Status: FULL CODE  Family Communication: No family present at bedside but updated daughter over the telephone Disposition Plan: Continue further work-up and treatment of his pneumonia and possibly CHF. PT/OT recommending SNF patient is refusing so we will have PT reevaluate for home health recommendations and will continue mild diuresis today and anticipate discharge home in a.m. as he is improving; Will need a home O2 screen prior to discharge  Consultants:   None   Procedures:  ECHOCARDIOGRAM IMPRESSIONS    1. The left ventricle has normal systolic function, with an ejection fraction of 55-60%. The cavity size was normal. Left ventricular diastolic Doppler parameters are consistent with impaired relaxation. Indeterminate  filling pressures No evidence of  left ventricular regional wall motion abnormalities.  2. The right ventricle has normal systolc function. The cavity was normal. There is no increase in right ventricular wall thickness. Right ventricular systolic pressure could not be assessed.  3. The aortic root and ascending aorta are normal in size and structure.  FINDINGS  Left Ventricle: The left ventricle has normal systolic function, with an ejection fraction of 55-60%. The cavity size was normal. There is no increase in left ventricular wall thickness. Left ventricular diastolic Doppler parameters are consistent with  impaired relaxation (grade I). Indeterminate filling pressures No evidence of left ventricular regional wall motion abnormalities.    Right Ventricle: The right ventricle has normal systolic function. The cavity was normal. There is no increase in right ventricular wall thickness. Right ventricular systolic pressure could not be assessed.  Left Atrium: Left atrial size was normal in size.  Right Atrium: Right atrial size was normal in size. Right atrial pressure is estimated at 10 mmHg.  Interatrial Septum: No atrial level shunt detected by color flow Doppler.  Pericardium: There is no evidence of  pericardial effusion.  Mitral Valve: The mitral valve is normal in structure. Mitral valve regurgitation is not visualized by color flow Doppler.  Tricuspid Valve: The tricuspid valve was normal in structure. Tricuspid valve regurgitation was not visualized by color flow Doppler.  Aortic Valve: The aortic valve is normal in structure. Aortic valve regurgitation was not visualized by color flow Doppler.  Aorta: The aortic root and ascending aorta are normal in size and structure.  Venous: The inferior vena cava was not well visualized.    +--------------+-------++ LEFT VENTRICLE        +--------------+-------++ PLAX 2D               +--------------+-------++  LVIDd:        4.39 cm +--------------+-------++ LVIDs:        3.01 cm +--------------+-------++ LV PW:        1.02 cm +--------------+-------++ LV IVS:       1.01 cm +--------------+-------++ LV SV:        52 ml   +--------------+-------++ LV SV Index:  24.46   +--------------+-------++                       +--------------+-------++  +-----------+-------++----------++ LEFT ATRIUM       Index      +-----------+-------++----------++ LA diam:   3.50 cm1.71 cm/m +-----------+-------++----------++    +-------------+-------++ AORTA                +-------------+-------++ Ao Root diam:3.10 cm +-------------+-------++  +--------------+----------++ MITRAL VALVE             +--------------+----------++ MV Area (PHT):4.68 cm   +--------------+----------++ MV PHT:       46.98 msec +--------------+----------++ MV Decel Time:162 msec   +--------------+----------++ +--------------+-----------++ MV E velocity:81.50 cm/s  +--------------+-----------++ MV A velocity:112.00 cm/s +--------------+-----------++ MV E/A ratio: 0.73        +--------------+-----------++   Antimicrobials:  Anti-infectives (From admission, onward)   Start     Dose/Rate Route Frequency Ordered Stop   06/04/19 1800  cefTRIAXone (ROCEPHIN) 2 g in sodium chloride 0.9 % 100 mL IVPB     2 g 200 mL/hr over 30 Minutes Intravenous Every 24 hours 06/04/19 0122 06/09/19 1759   06/04/19 1800  azithromycin (ZITHROMAX) 500 mg in sodium chloride 0.9 % 250 mL IVPB     500 mg 250 mL/hr over 60 Minutes Intravenous Every 24 hours 06/04/19 0122 06/09/19 1759   06/03/19 2045  cefTRIAXone (ROCEPHIN) 1 g in sodium chloride 0.9 % 100 mL IVPB     1 g 200 mL/hr over 30 Minutes Intravenous  Once 06/03/19 2044 06/03/19 2351   06/03/19 2045  azithromycin (ZITHROMAX) 500 mg in sodium chloride 0.9 % 250 mL IVPB     500 mg 250 mL/hr over 60 Minutes  Intravenous  Once 06/03/19 2044 06/03/19 2351     Subjective: Seen and examined at bedside and states that he is feeling better but still coughing some.  No chest pain, lightheadedness or dizziness.  No nausea or vomiting.  Off of oxygen now and adamantly refusing SNF but still feels weak.  No other concerns or plans at this time.  Objective: Vitals:   06/06/19 0953 06/06/19 1608 06/06/19 2255 06/07/19 0747  BP:  140/77 (!) 144/56 (!) 157/58  Pulse:  80 (!) 59 61  Resp:   20 17  Temp:  98.7 F (37.1 C) 98.4 F (36.9 C) (!) 97.3 F (36.3 C)  TempSrc:  Oral  Oral  SpO2:  97% 95% 92%  Weight: 95.6 kg     Height:        Intake/Output Summary (Last 24 hours) at 06/07/2019 1238 Last data filed at 06/07/2019 1200 Gross per 24 hour  Intake 240 ml  Output 2500 ml  Net -2260 ml   Filed Weights   06/04/19 0058 06/06/19 0953  Weight: 91.6 kg 95.6 kg   Examination: Physical Exam:  Constitutional: Well-nourished, well-developed obese elderly Caucasian male currently no acute distress appears improving and states that he is not coughing as much Eyes: Lids extract are normal.  Skin icteric ENMT: External ears and nose appear normal.  Has slightly poor dentition.  Grossly normal hearing Neck: Appears supple no JVD Respiratory: Diminished auscultation bilaterally no appreciable wheezing, rales, rhonchi and coarse breath sounds are improving and did not appreciate very many crackles today.  Has a normal respiratory effort is not tachypneic and he is off of supplemental oxygen via nasal cannula.  Not coughing as much Cardiovascular: Regular rate and rhythm.  Has a 2 out of 6 systolic murmur.  Has no appreciable edema today Abdomen: Soft, nontender, distended slightly.  Bowel sounds are present GU: Deferred Musculoskeletal: No contractures or cyanosis.  No joint deformities in the upper and lower extremities Skin: No appreciable rashes or lesions on the skin evaluation Neurologic: Cranial nerves  II through XII grossly intact appreciable focal deficits Psychiatric: Normal judgment and insight.  Patient is awake, alert, not as anxious  Data Reviewed: I have personally reviewed following labs and imaging studies  CBC: Recent Labs  Lab 06/03/19 1725 06/04/19 0231 06/05/19 0827 06/06/19 0701 06/07/19 0415  WBC 18.5* 16.5* 15.1* 14.6* 13.4*  NEUTROABS  --   --  12.4* 11.5* 11.3*  HGB 10.0* 9.0* 8.8* 9.0* 8.9*  HCT 31.4* 27.3* 26.7* 27.2* 26.4*  MCV 90.8 89.2 87.0 87.7 86.0  PLT 234 210 205 226 967   Basic Metabolic Panel: Recent Labs  Lab 06/03/19 1725 06/04/19 0231 06/05/19 0827 06/06/19 0701 06/07/19 0415  NA 137 138 140 140 137  K 4.5 3.7 3.8 3.9 4.2  CL 101 103 106 106 104  CO2 21* 21* 21* 23 22  GLUCOSE 188* 184* 166* 166* 265*  BUN 32* 35* 34* 25* 29*  CREATININE 2.03* 1.75* 1.66* 1.44* 1.29*  CALCIUM 8.8* 8.3* 8.4* 8.5* 8.7*  MG  --   --  1.4* 1.8 1.7  PHOS  --   --  2.2* 3.3 3.5   GFR: Estimated Creatinine Clearance: 49.5 mL/min (A) (by C-G formula based on SCr of 1.29 mg/dL (H)). Liver Function Tests: Recent Labs  Lab 06/04/19 0617 06/05/19 0827 06/06/19 0701 06/07/19 0415  AST 17 24 47* 43*  ALT 13 15 32 43  ALKPHOS 56 54 71 77  BILITOT 1.0 0.8 0.6 0.6  PROT 6.3* 6.0* 5.9* 6.1*  ALBUMIN 2.8* 2.5* 2.3* 2.3*   No results for input(s): LIPASE, AMYLASE in the last 168 hours. No results for input(s): AMMONIA in the last 168 hours. Coagulation Profile: No results for input(s): INR, PROTIME in the last 168 hours. Cardiac Enzymes: No results for input(s): CKTOTAL, CKMB, CKMBINDEX, TROPONINI in the last 168 hours. BNP (last 3 results) No results for input(s): PROBNP in the last 8760 hours. HbA1C: No results for input(s): HGBA1C in the last 72 hours. CBG: Recent Labs  Lab 06/06/19 1151 06/06/19 1605 06/06/19 2257 06/07/19 0744 06/07/19 1136  GLUCAP 260* 302* 285* 214* 283*   Lipid Profile: No results for input(s):  CHOL, HDL, LDLCALC,  TRIG, CHOLHDL, LDLDIRECT in the last 72 hours. Thyroid Function Tests: No results for input(s): TSH, T4TOTAL, FREET4, T3FREE, THYROIDAB in the last 72 hours. Anemia Panel: Recent Labs    06/07/19 0415  VITAMINB12 3,072*  FOLATE 6.4  FERRITIN 693*  TIBC 167*  IRON 26*  RETICCTPCT 0.6   Sepsis Labs: Recent Labs  Lab 06/03/19 2144 06/03/19 2331 06/04/19 0833 06/05/19 0827 06/06/19 0701  PROCALCITON  --   --  0.76 0.73 0.45  LATICACIDVEN 2.2* 1.4  --   --   --     Recent Results (from the past 240 hour(s))  SARS Coronavirus 2 [facility] order, Performed in [facility] hospital lab) Nasopharyngeal Nasopharyngeal Swab     Status: None   Collection Time: 06/03/19  9:34 PM   Specimen: Nasopharyngeal Swab  Result Value Ref Range Status   SARS Coronavirus 2 NEGATIVE NEGATIVE Final    Comment: (NOTE) If result is NEGATIVE SARS-CoV-2 target nucleic acids are NOT DETECTED. The SARS-CoV-2 RNA is generally detectable in upper and lower  respiratory specimens during the acute phase of infection. The lowest  concentration of SARS-CoV-2 viral copies this assay can detect is 250  copies / mL. A negative result does not preclude SARS-CoV-2 infection  and should not be used as the sole basis for treatment or other  patient management decisions.  A negative result may occur with  improper specimen collection / handling, submission of specimen other  than nasopharyngeal swab, presence of viral mutation(s) within the  areas targeted by this assay, and inadequate number of viral copies  (<250 copies / mL). A negative result must be combined with clinical  observations, patient history, and epidemiological information. If result is POSITIVE SARS-CoV-2 target nucleic acids are DETECTED. The SARS-CoV-2 RNA is generally detectable in upper and lower  respiratory specimens dur ing the acute phase of infection.  Positive  results are indicative of active infection with SARS-CoV-2.  Clinical   correlation with patient history and other diagnostic information is  necessary to determine patient infection status.  Positive results do  not rule out bacterial infection or co-infection with other viruses. If result is PRESUMPTIVE POSTIVE SARS-CoV-2 nucleic acids MAY BE PRESENT.   A presumptive positive result was obtained on the submitted specimen  and confirmed on repeat testing.  While 2019 novel coronavirus  (SARS-CoV-2) nucleic acids may be present in the submitted sample  additional confirmatory testing may be necessary for epidemiological  and / or clinical management purposes  to differentiate between  SARS-CoV-2 and other Sarbecovirus currently known to infect humans.  If clinically indicated additional testing with an alternate test  methodology [phone]) is advised. The SARS-CoV-2 RNA is generally  detectable in upper and lower respiratory sp ecimens during the acute  phase of infection. The expected result is Negative. Fact Sheet for Patients:  StrictlyIdeas.no Fact Sheet for Healthcare Providers: BankingDealers.co.za This test is not yet approved or cleared by the [place] FDA and has been authorized for detection and/or diagnosis of SARS-CoV-2 by FDA under an Emergency Use Authorization (EUA).  This EUA will remain in effect (meaning this test can be used) for the duration of the COVID-19 declaration under Section 564(b)(1) of the Act, 21 U.S.C. section 360bbb-3(b)(1), unless the authorization is terminated or revoked sooner. Performed at [facility], [place] [address] [place], [place] [address]   Blood Culture (routine x 2)     Status: None (Preliminary result)   Collection Time: 06/03/19  9:41 PM   Specimen: BLOOD RIGHT HAND  Result Value Ref Range Status   Specimen Description BLOOD RIGHT HAND  Final   Special Requests   Final    BOTTLES DRAWN AEROBIC AND ANAEROBIC Blood Culture results may not be  optimal due to an inadequate volume of blood received in culture bottles   Culture   Final    NO GROWTH 4 DAYS Performed at [facility], [place] [address] [place], [place] [address]    Report Status PENDING  Incomplete  Blood Culture (routine x 2)     Status: None (Preliminary result)   Collection Time: 06/03/19  9:44 PM   Specimen: BLOOD  Result Value Ref Range Status   Specimen Description BLOOD LEFT ANTECUBITAL  Final   Special Requests   Final    BOTTLES DRAWN AEROBIC AND ANAEROBIC Blood Culture adequate volume   Culture   Final    NO GROWTH 4 DAYS Performed at [facility], [place] [address], [place], [place] [address]    Report Status PENDING  Incomplete  MRSA PCR Screening     Status: Abnormal   Collection Time: 06/04/19  2:29 AM   Specimen: Aptima Multi Swab; Nasopharyngeal  Result Value Ref Range Status   MRSA by PCR POSITIVE (A) NEGATIVE Final    Comment:        The GeneXpert MRSA Assay (FDA approved for NASAL specimens only), is one component of a comprehensive MRSA colonization surveillance program. It is not intended to diagnose MRSA infection nor to guide or monitor treatment for MRSA infections. RESULT CALLED TO, READ BACK BY AND VERIFIED WITH: A. TOPOLOKI,RN [id number] 06/04/2019 Mena Goes Performed at [facility], [place] [address], [place], [place] [address]   SARS Coronavirus 2 [facility] order, Performed in [facility] hospital lab)     Status: None   Collection Time: 06/04/19  2:29 AM  Result Value Ref Range Status   SARS Coronavirus 2 NEGATIVE NEGATIVE Final    Comment: (NOTE) If result is NEGATIVE SARS-CoV-2 target nucleic acids are NOT DETECTED. The SARS-CoV-2 RNA is generally detectable in upper and lower  respiratory specimens during the acute phase of infection. The lowest  concentration of SARS-CoV-2 viral copies this assay can detect is 250  copies / mL. A negative result does not preclude SARS-CoV-2 infection  and should not be used as  the sole basis for treatment or other  patient management decisions.  A negative result may occur with  improper specimen collection / handling, submission of specimen other  than nasopharyngeal swab, presence of viral mutation(s) within the  areas targeted by this assay, and inadequate number of viral copies  (<250 copies / mL). A negative result must be combined with clinical  observations, patient history, and epidemiological information. If result is POSITIVE SARS-CoV-2 target nucleic acids are DETECTED. The SARS-CoV-2 RNA is generally detectable in upper and lower  respiratory specimens dur ing the acute phase of infection.  Positive  results are indicative of active infection with SARS-CoV-2.  Clinical  correlation with patient history and other diagnostic information is  necessary to determine patient infection status.  Positive results do  not rule out bacterial infection or co-infection with other viruses. If result is PRESUMPTIVE POSTIVE SARS-CoV-2 nucleic acids MAY BE PRESENT.   A presumptive positive result was obtained on the submitted specimen  and confirmed on repeat testing.  While 2019 novel coronavirus  (SARS-CoV-2) nucleic acids may be present in the submitted  sample  additional confirmatory testing may be necessary for epidemiological  and / or clinical management purposes  to differentiate between  SARS-CoV-2 and other Sarbecovirus currently known to infect humans.  If clinically indicated additional testing with an alternate test  methodology [id number]) is advised. The SARS-CoV-2 RNA is generally  detectable in upper and lower respiratory sp ecimens during the acute  phase of infection. The expected result is Negative. Fact Sheet for Patients:  StrictlyIdeas.no Fact Sheet for Healthcare Providers: BankingDealers.co.za This test is not yet approved or cleared by the [place] FDA and has been authorized for  detection and/or diagnosis of SARS-CoV-2 by FDA under an Emergency Use Authorization (EUA).  This EUA will remain in effect (meaning this test can be used) for the duration of the COVID-19 declaration under Section 564(b)(1) of the Act, 21 U.S.C. section 360bbb-3(b)(1), unless the authorization is terminated or revoked sooner. Performed at [facility], [place] [address], [place], [place] [address]   Culture, sputum-assessment     Status: None   Collection Time: 06/04/19  4:35 AM   Specimen: Expectorated Sputum  Result Value Ref Range Status   Specimen Description EXPECTORATED SPUTUM  Final   Special Requests NONE  Final   Sputum evaluation   Final    THIS SPECIMEN IS ACCEPTABLE FOR SPUTUM CULTURE Performed at [facility], [place] [address], [place], [place] [address]    Report Status 06/05/2019 FINAL  Final  Culture, respiratory     Status: None   Collection Time: 06/04/19  4:35 AM  Result Value Ref Range Status   Specimen Description EXPECTORATED SPUTUM  Final   Special Requests NONE Reflexed from [id number]  Final   Gram Stain   Final    FEW WBC PRESENT, PREDOMINANTLY PMN MODERATE GRAM POSITIVE COCCI IN PAIRS IN CLUSTERS MODERATE GRAM NEGATIVE RODS RARE GRAM POSITIVE RODS    Culture   Final    MODERATE Consistent with normal respiratory flora. Performed at [facility], [place] [address], [place], [place] [address]    Report Status 06/07/2019 FINAL  Final  Respiratory Panel by PCR     Status: None   Collection Time: 06/06/19 10:34 AM   Specimen: Nasopharyngeal Swab; Respiratory  Result Value Ref Range Status   Adenovirus NOT DETECTED NOT DETECTED Final   Coronavirus 229E NOT DETECTED NOT DETECTED Final    Comment: (NOTE) The Coronavirus on the Respiratory Panel, DOES NOT test for the novel  Coronavirus (2019 nCoV)    Coronavirus HKU1 NOT DETECTED NOT DETECTED Final   Coronavirus NL63 NOT DETECTED NOT DETECTED Final   Coronavirus OC43 NOT DETECTED NOT DETECTED  Final   Metapneumovirus NOT DETECTED NOT DETECTED Final   Rhinovirus / Enterovirus NOT DETECTED NOT DETECTED Final   Influenza A NOT DETECTED NOT DETECTED Final   Influenza B NOT DETECTED NOT DETECTED Final   Parainfluenza Virus 1 NOT DETECTED NOT DETECTED Final   Parainfluenza Virus 2 NOT DETECTED NOT DETECTED Final   Parainfluenza Virus 3 NOT DETECTED NOT DETECTED Final   Parainfluenza Virus 4 NOT DETECTED NOT DETECTED Final   Respiratory Syncytial Virus NOT DETECTED NOT DETECTED Final   Bordetella pertussis NOT DETECTED NOT DETECTED Final   Chlamydophila pneumoniae NOT DETECTED NOT DETECTED Final   Mycoplasma pneumoniae NOT DETECTED NOT DETECTED Final    Comment: Performed at [facility], [place]. [address] [place], [place] [address]   Radiology Studies: Dg Chest Port 1 View  Result Date: 06/07/2019  CLINICAL DATA:  Shortness of breath and cough EXAM: PORTABLE CHEST 1 VIEW COMPARISON:  06/06/2019 FINDINGS: Cardiac shadow is enlarged but stable. The lungs are well aerated bilaterally with improved aeration in the bases bilaterally although some minimal residual density remains. No bony abnormality is seen. IMPRESSION: Improved aeration in the bases bilaterally. Electronically Signed   By: Inez Catalina M.D.   On: 06/07/2019 09:03   Dg Chest Port 1 View  Result Date: 06/06/2019 CLINICAL DATA:  Shortness of breath. COVID-19. EXAM: PORTABLE CHEST 1 VIEW COMPARISON:  06/05/2019 and 06/03/2019 FINDINGS: The heart size and pulmonary vascularity are normal. There has been slight progression of the hazy infiltrate at the right lung base. Faint infiltrates in the right midzone peripherally the left base are minimal, improved at the left base. No effusions. No acute bone abnormality. IMPRESSION: Slight progression of the hazy infiltrate at the right lung base. Slight improvement in the faint infiltrates at the left base. Electronically Signed   By: Lorriane Shire M.D.   On: 06/06/2019 08:12    Scheduled Meds: . Chlorhexidine Gluconate Cloth  6 each Topical Q0600  . enoxaparin (LOVENOX) injection  40 mg Subcutaneous Q24H  . gabapentin  300 mg Oral TID  . guaiFENesin  1,200 mg Oral BID  . insulin aspart  0-9 Units Subcutaneous TID WC  . insulin glargine  45 Units Subcutaneous Daily  . methylPREDNISolone (SOLU-MEDROL) injection  40 mg Intravenous Q24H  . mupirocin ointment  1 application Nasal BID  . niacin  500 mg Oral QHS  . omega-3 acid ethyl esters  1 g Oral BID  . simvastatin  40 mg Oral Daily   Continuous Infusions: . azithromycin 500 mg (06/06/19 1748)  . cefTRIAXone (ROCEPHIN)  IV 2 g (06/06/19 2041)    LOS: 4 days   Kerney Elbe, DO [facility] PAGER is on [place]  If 7PM-7AM, please contact night-coverage [email or web address] Password [facility] 06/07/2019, 12:38 PM

## 2019-06-07 NOTE — Progress Notes (Signed)
SATURATION QUALIFICATIONS: (This note is used to comply with regulatory documentation for home oxygen)  Patient Saturations on Room Air at Rest = 94%  Patient Saturations on Room Air while Ambulating = 93%  Patient Saturations on 0 Liters of oxygen while Ambulating = 93%  Patient ambulated 100 ft

## 2019-06-07 NOTE — Progress Notes (Signed)
Inpatient Diabetes Program Recommendations  AACE/ADA: New Consensus Statement on Inpatient Glycemic Control (2015)  Target Ranges:  Prepandial:   less than 140 mg/dL      Peak postprandial:   less than 180 mg/dL (1-2 hours)      Critically ill patients:  140 - 180 mg/dL   Lab Results  Component Value Date   GLUCAP 283 (H) 06/07/2019   HGBA1C 5.7 (H) 03/30/2019    Review of Glycemic Control  Diabetes history: DM2 Outpatient Diabetes medications: Lantus 64 units QD, metformin 850 mg bid Current orders for Inpatient glycemic control: Lantus 45 units QD, Novolog 0-9 units tidwc.  On Solumedrol 40 mg Q24H HgbA1C - 5.7% Blood sugars elevated with steroids. Pt eating 100%.   Inpatient Diabetes Program Recommendations:     Increase Lantus to 50 units QD Add Novolog 3 units tidwc for meal coverage insulin   Will continue to follow.  Thank you. Lorenda Peck, RD, LDN, CDE Inpatient Diabetes Coordinator [phone]

## 2019-06-07 NOTE — Progress Notes (Signed)
  Speech Language Pathology Treatment: Dysphagia  Patient Details Name: JOBE MUTCH MRN: [id number] DOB: [birth year] Today's Date: 06/07/2019 Time: 1550-1600 SLP Time Calculation (min) (ACUTE ONLY): 10 min  Assessment / Plan / Recommendation Clinical Impression  Pt seen for assessment of diet tolerance. Pt was alert, seated in recliner eating graham crackers and peanut butter upon arrival of SLP. Pt requested diet Coke, which was provided for pt. No overt s/s aspiration or obvious oral difficulty on any consistency observed or reported. Recommend continue regular solids and thin liquids. Goals met. ST to sign off at this time. Please reconsult if needs aiose.     HPI HPI: Patient is an 84 y.o. male with PMH: DM-2, HTN, chronic kidney disease stage III, HLD, who has been experiencing SOB with productive cough for past week prior to hospitalization. He was referred to ER by his PCP. In ER he was febrile with temperature at 103 degrees F, CXR showed congestion vs infiltrates. Covid-19 test completed twice and both were negative. Patient was started on empiric antibiotics for community acquired PNA.      SLP Plan  Discharge SLP treatment due to All goals met      Recommendations  Diet recommendations: Regular;Thin liquid Liquids provided via: Cup;Straw Medication Administration: Whole meds with liquid Supervision: Patient able to self feed Compensations: Minimize environmental distractions;Slow rate;Small sips/bites Postural Changes and/or Swallow Maneuvers: Seated upright 90 degrees;Upright 30-60 min after meal                Oral Care Recommendations: Oral care BID Follow up Recommendations: None SLP Visit Diagnosis: Dysphagia, unspecified (R13.10) Plan: All goals met;Discharge SLP treatment due to (comment)       GO               Celia B. Quentin Ore [facility], [place] Speech Language Pathologist [phone]  Shonna Chock 06/07/2019, 4:00 PM

## 2019-06-08 ENCOUNTER — Inpatient Hospital Stay (HOSPITAL_COMMUNITY): Payer: PPO

## 2019-06-08 LAB — CBC WITH DIFFERENTIAL/PLATELET
Abs Immature Granulocytes: 0.39 10*3/uL — ABNORMAL HIGH (ref 0.00–0.07)
Basophils Absolute: 0 10*3/uL (ref 0.0–0.1)
Basophils Relative: 0 %
Eosinophils Absolute: 0 10*3/uL (ref 0.0–0.5)
Eosinophils Relative: 0 %
HCT: 28 % — ABNORMAL LOW (ref 39.0–52.0)
Hemoglobin: 9.2 g/dL — ABNORMAL LOW (ref 13.0–17.0)
Immature Granulocytes: 3 %
Lymphocytes Relative: 11 %
Lymphs Abs: 1.6 10*3/uL (ref 0.7–4.0)
MCH: 28.6 pg (ref 26.0–34.0)
MCHC: 32.9 g/dL (ref 30.0–36.0)
MCV: 87 fL (ref 80.0–100.0)
Monocytes Absolute: 0.9 10*3/uL (ref 0.1–1.0)
Monocytes Relative: 7 %
Neutro Abs: 11.1 10*3/uL — ABNORMAL HIGH (ref 1.7–7.7)
Neutrophils Relative %: 79 %
Platelets: 306 10*3/uL (ref 150–400)
RBC: 3.22 MIL/uL — ABNORMAL LOW (ref 4.22–5.81)
RDW: 14.2 % (ref 11.5–15.5)
WBC: 14.1 10*3/uL — ABNORMAL HIGH (ref 4.0–10.5)
nRBC: 0 % (ref 0.0–0.2)

## 2019-06-08 LAB — GLUCOSE, CAPILLARY
Glucose-Capillary: 201 mg/dL — ABNORMAL HIGH (ref 70–99)
Glucose-Capillary: 231 mg/dL — ABNORMAL HIGH (ref 70–99)

## 2019-06-08 LAB — COMPREHENSIVE METABOLIC PANEL
ALT: 62 U/L — ABNORMAL HIGH (ref 0–44)
AST: 49 U/L — ABNORMAL HIGH (ref 15–41)
Albumin: 2.2 g/dL — ABNORMAL LOW (ref 3.5–5.0)
Alkaline Phosphatase: 74 U/L (ref 38–126)
Anion gap: 9 (ref 5–15)
BUN: 40 mg/dL — ABNORMAL HIGH (ref 8–23)
CO2: 23 mmol/L (ref 22–32)
Calcium: 9 mg/dL (ref 8.9–10.3)
Chloride: 104 mmol/L (ref 98–111)
Creatinine, Ser: 1.35 mg/dL — ABNORMAL HIGH (ref 0.61–1.24)
GFR calc Af Amer: 56 mL/min — ABNORMAL LOW (ref >60)
GFR calc non Af Amer: 49 mL/min — ABNORMAL LOW (ref >60)
Glucose, Bld: 286 mg/dL — ABNORMAL HIGH (ref 70–99)
Potassium: 4.6 mmol/L (ref 3.5–5.1)
Sodium: 136 mmol/L (ref 135–145)
Total Bilirubin: 0.4 mg/dL (ref 0.3–1.2)
Total Protein: 5.9 g/dL — ABNORMAL LOW (ref 6.5–8.1)

## 2019-06-08 LAB — CULTURE, BLOOD (ROUTINE X 2)
Culture: NO GROWTH
Culture: NO GROWTH
Special Requests: ADEQUATE

## 2019-06-08 LAB — MAGNESIUM: Magnesium: 1.9 mg/dL (ref 1.7–2.4)

## 2019-06-08 LAB — PHOSPHORUS: Phosphorus: 3.3 mg/dL (ref 2.5–4.6)

## 2019-06-08 MED ORDER — CEFUROXIME AXETIL 500 MG PO TABS: 500.0000 mg | ORAL_TABLET | Freq: Two times a day (BID) | ORAL | 0 refills | Status: AC

## 2019-06-08 NOTE — Discharge Summary (Signed)
Physician Discharge Summary  Christian [name] [id number] DOB: 1935-04-09 DOA: 82/28/2020  PCP: Susy Frizzle, MD  Admit date: 06/03/2019 Discharge date: 06/08/2019  Recommendations for Outpatient Follow-up:   Resolution of pneumonia  Acute on chronic diastolic CHF --Urine output 2500.  -2.2 L overnight.  -5.3 L since admission. --Not on diuretics at home.  Suggest consideration for intermittent diuretics as needed but this can be deferred to the outpatient setting.  Mild elevation of AST, ALT.  Suspect fatty liver.  Consider repeat CMP as an outpatient.   Follow-up Information    Susy Frizzle, MD. Schedule an appointment as soon as possible for a visit in 1 week(s).   Specialty: Family Medicine Contact information: [address] [place]  [address] [phone]            Discharge Diagnoses: Principal diagnosis is #1 1. Sepsis secondary to pneumonia.  COVID-19 negative.  Respiratory panel negative. 2. Acute hypoxic respiratory failure secondary to pneumonia 3. Acute kidney injury superimposed on chronic kidney disease stage III 4. Acute on chronic diastolic CHF 5. Diabetes mellitus type 2 on insulin with diabetic neuropathy 6. Essential hypertension 7. Chronic normocytic anemia  Discharge Condition: improved Disposition: home with HHPT, OT (refused SNF)  Diet recommendation: heart healthy, diabetic diet  Filed Weights   06/04/19 0058 06/06/19 0953  Weight: 91.6 kg 95.6 kg    History of present illness:  84 year old man presenting with progressive shortness of breath and productive cough for a week, found to be febrile to 103 with leukocytosis.  Chest x-ray showed congestion versus infiltrates. Admitted for pneumonia, sepsis as well as acute on chronic kidney disease stage III.   Hospital Course:  Treated with empiric antibiotics with gradual clinical improvement.  Afebrile, hypoxia resolved.  Will complete a total of 7 days of  antibiotics.  Acute kidney injury resolved with supportive care.  Also treated for acute on chronic diastolic CHF with rapid improvement.  Hospitalization was uncomplicated.  SNF was recommended but the patient refused this.  Individual issues as below.  I offered to call his daughter, he told me not to.  Patient alert and oriented and clearly understands his condition.  Sepsis secondary to pneumonia.  COVID-19 negative.  Respiratory panel negative. --Continues on antibiotics.  Treated with steroids. --Chest x-ray 9/2 showed residual multilobar bronchopneumonia, expect findings to lag behind clinical improvement.  Acute hypoxic respiratory failure secondary to pneumonia --Resolved 9/1.  Ambulating without difficulty.  Acute kidney injury superimposed on chronic kidney disease stage III --Lisinopril and furosemide held on admission, can resume on discharge. --Acute kidney injury resolved.  Acute on chronic diastolic CHF --Urine output 2500.  -2.2 L overnight.  -5.3 L since admission. --Not on diuretics at home.  Suggest consideration for intermittent diuretics as needed but this can be deferred to the outpatient setting.  Diabetes mellitus type 2 on insulin with diabetic neuropathy --CBG stable.  Continue outpatient regimen.  Mild elevation of AST, ALT.  Suspect fatty liver.  Consider repeat CMP as an outpatient.  Essential hypertension --Stable.  Lisinopril held secondary to acute kidney injury.  Chronic normocytic anemia --Stable, at baseline.  Mildly elevated high sensitive troponin.  Trivial.  Unclear why this was checked as there is no suspicion for ACS on admission.  Ambulated 75 feet with rolling walker.  Advised to obtain bedside commode.  Occupational therapy notes patient can return home with heavy supervision throughout the day.  No hypoxia during session.  Significant Hospital Events  8/28 admitted for sepsis, pneumonia  8/29 speech therapy recommended regular  diet, thin liquids  Consults:   None  Procedures:   Echocardiogram IMPRESSIONS    1. The left ventricle has normal systolic function, with an ejection fraction of 55-60%. The cavity size was normal. Left ventricular diastolic Doppler parameters are consistent with impaired relaxation. Indeterminate filling pressures No evidence of  left ventricular regional wall motion abnormalities.  2. The right ventricle has normal systolc function. The cavity was normal. There is no increase in right ventricular wall thickness. Right ventricular systolic pressure could not be assessed.  3. The aortic root and ascending aorta are normal in size and structure.   Micro Data:   COVID negative  Respiratory panel negative  Respiratory culture consistent with normal flora  Legionella and strep pneumoniae antigens negative.  Antimicrobials:   Azithromycin 8/28 > 9/1  Ceftriaxone 8/28 >  9/1  Ceftin 9/2 > 9/3  Today's assessment: S: Breathing well, no complaints.  No lower extremity edema.  Tolerating diet. O: Vitals:  Vitals:   06/07/19 2307 06/08/19 0800  BP: (!) 178/68 (!) 154/59  Pulse: 68 (!) 59  Resp: 20   Temp: 97.9 F (36.6 C) 97.7 F (36.5 C)  SpO2: 93% 93%    Constitutional:  . Appears calm and comfortable Respiratory:  . CTA bilaterally, no w/r/r.  . Respiratory effort normal.  Cardiovascular:  . RRR, no m/r/g . Telemetry sinus rhythm . No LE extremity edema   Abdomen:  . Soft, no right upper quadrant pain Psychiatric:  . judgement and insight appear intact . Mental status o Mood, affect appropriate o Oriented to self, location, month, year, reason for admission  Today's Data   CBG stable  Potassium within normal limits.  BUN slightly higher at 40.  Creatinine stable at 1.35.  Phosphorus and magnesium within normal limits.  AST slightly elevated at 49, ALT slightly elevated at 62.  WBC 14.1, stable.  Hemoglobin stable at 9.2  Discharge  Instructions  Discharge Instructions    Diet - low sodium heart healthy   Complete by: As directed    Diet Carb Modified   Complete by: As directed    Discharge instructions   Complete by: As directed    Call your physician or seek immediate medical attention for shortness of breath, pain, fever, swelling, weight gain or worsening of condition.   Increase activity slowly   Complete by: As directed      Allergies as of 06/08/2019      Reactions   Actos [pioglitazone] Other (See Comments)   LE Edema--03/2014      Medication List    STOP taking these medications   enoxaparin 40 MG/0.4ML injection Commonly known as: LOVENOX   HYDROcodone-acetaminophen 7.5-325 MG tablet Commonly known as: NORCO   oxyCODONE 5 MG immediate release tablet Commonly known as: Oxy IR/ROXICODONE     TAKE these medications   acetaminophen 500 MG tablet Commonly known as: TYLENOL Take 1,000 mg by mouth every 8 (eight) hours as needed for mild pain or moderate pain (back pain).   B-12 PO Take 5,000 mcg by mouth daily.   cefUROXime 500 MG tablet Commonly known as: CEFTIN Take 1 tablet (500 mg total) by mouth 2 (two) times daily for 2 days. Notes to patient: 06/08/2019 PM   cholecalciferol 25 MCG (1000 UT) tablet Commonly known as: VITAMIN D3 Take 1,000 Units by mouth daily.   Fish Oil 1000 MG Caps Take 1,000 mg by mouth daily.  fish oil-omega-3 fatty acids 1000 MG capsule Take 1 g by mouth 2 (two) times daily.   furosemide 20 MG tablet Commonly known as: LASIX Take 20 mg by mouth daily.   gabapentin 600 MG tablet Commonly known as: NEURONTIN TAKE 1 TABLET BY MOUTH THREE TIMES DAILY Notes to patient: 06/08/2019 PM 1800   Insulin Pen Needle 32G X 6 MM Misc Commonly known as: BD Pen Needle Micro U/F Use with insulin pen   Lantus SoloStar 100 UNIT/ML Solostar Pen Generic drug: Insulin Glargine INJECT 64 UNITS INTO THE SKIN DAILY What changed: See the new instructions. Notes to patient:  06/09/2019 AM   LEG CRAMPS PO Take 2 tablets by mouth as needed (leg cramps).   lisinopril 5 MG tablet Commonly known as: ZESTRIL Take 1 tablet (5 mg total) by mouth daily.   metFORMIN 850 MG tablet Commonly known as: GLUCOPHAGE TAKE 1 TABLET BY MOUTH TWICE DAILY WITH A MEAL What changed: See the new instructions.   niacin 500 MG CR tablet Commonly known as: NIASPAN TAKE 1 TABLET BY MOUTH AT BEDTIME Notes to patient: 06/08/2019 PM   ONE TOUCH ULTRA TEST test strip Generic drug: glucose blood USE TO CHECK FASTING BLOOD SUGARS THREE TIMES DAILY   simvastatin 40 MG tablet Commonly known as: ZOCOR Take 1 tablet (40 mg total) by mouth daily.   traMADol 50 MG tablet Commonly known as: ULTRAM Take 1 tablet (50 mg total) by mouth every 6 (six) hours as needed for moderate pain.            Durable Medical Equipment  (From admission, onward)         Start     Ordered   06/08/19 1536  For home use only DME 3 n 1  Once     06/08/19 1535         Allergies  Allergen Reactions  . Actos [Pioglitazone] Other (See Comments)    LE Edema--03/2014    The results of significant diagnostics from this hospitalization (including imaging, microbiology, ancillary and laboratory) are listed below for reference.    Significant Diagnostic Studies: Dg Chest 2 View  Result Date: 06/03/2019 CLINICAL DATA:  Shortness of breath EXAM: CHEST - 2 VIEW COMPARISON:  02/17/2018 FINDINGS: Cardiomegaly, vascular congestion. Bilateral perihilar and lower lobe opacities could reflect edema or infection. No visible significant effusions or acute bony abnormality. IMPRESSION: Cardiomegaly, vascular congestion. Bilateral perihilar and lower lobe opacities, favor edema although infection is not excluded. Electronically Signed   By: Rolm Baptise M.D.   On: 06/03/2019 18:21   Dg Chest Port 1 View  Result Date: 06/08/2019 CLINICAL DATA:  84 year old male with history of shortness of breath. EXAM: PORTABLE CHEST  1 VIEW COMPARISON:  Chest x-ray 06/07/2019. FINDINGS: Low lung volumes. Patchy ill-defined interstitial opacities are again noted throughout the mid to lower lungs bilaterally. Aeration is unchanged. No confluent consolidative airspace disease. No pleural effusions. No evidence of pulmonary edema. Heart size is normal. Upper mediastinal contours are distorted by patient's rotation the right. Aortic atherosclerosis. IMPRESSION: 1. Persistent patchy ill-defined interstitial opacities throughout the mid to lower lungs bilaterally suggesting residual multilobar bronchopneumonia. Electronically Signed   By: Vinnie Langton M.D.   On: 06/08/2019 09:48   Dg Chest Port 1 View  Result Date: 06/07/2019 CLINICAL DATA:  Shortness of breath and cough EXAM: PORTABLE CHEST 1 VIEW COMPARISON:  06/06/2019 FINDINGS: Cardiac shadow is enlarged but stable. The lungs are well aerated bilaterally with improved aeration in the bases  bilaterally although some minimal residual density remains. No bony abnormality is seen. IMPRESSION: Improved aeration in the bases bilaterally. Electronically Signed   By: Inez Catalina M.D.   On: 06/07/2019 09:03   Dg Chest Port 1 View  Result Date: 06/06/2019 CLINICAL DATA:  Shortness of breath. COVID-19. EXAM: PORTABLE CHEST 1 VIEW COMPARISON:  06/05/2019 and 06/03/2019 FINDINGS: The heart size and pulmonary vascularity are normal. There has been slight progression of the hazy infiltrate at the right lung base. Faint infiltrates in the right midzone peripherally the left base are minimal, improved at the left base. No effusions. No acute bone abnormality. IMPRESSION: Slight progression of the hazy infiltrate at the right lung base. Slight improvement in the faint infiltrates at the left base. Electronically Signed   By: Lorriane Shire M.D.   On: 06/06/2019 08:12   Dg Chest Port 1 View  Result Date: 06/05/2019 CLINICAL DATA:  Shortness of breath. EXAM: PORTABLE CHEST 1 VIEW COMPARISON:  Chest  x-ray dated June 03, 2019. FINDINGS: The patient is rotated to the right. Stable cardiomegaly. Bilateral perihilar and lower lobe interstitial opacities have mildly increased. Low lung volumes with mild bibasilar atelectasis. No pleural effusion or pneumothorax. No acute osseous abnormality. IMPRESSION: 1. Bilateral perihilar and lower lobe interstitial opacities which could reflect edema or infection appear mildly progressed, although this could simply reflect increased bronchovascular crowding due to lower lung volumes when compared to prior study. Electronically Signed   By: Titus Dubin M.D.   On: 06/05/2019 09:41    Microbiology: Recent Results (from the past 240 hour(s))  SARS Coronavirus 2 [facility] order, Performed in [facility] hospital lab) Nasopharyngeal Nasopharyngeal Swab     Status: None   Collection Time: 06/03/19  9:34 PM   Specimen: Nasopharyngeal Swab  Result Value Ref Range Status   SARS Coronavirus 2 NEGATIVE NEGATIVE Final    Comment: (NOTE) If result is NEGATIVE SARS-CoV-2 target nucleic acids are NOT DETECTED. The SARS-CoV-2 RNA is generally detectable in upper and lower  respiratory specimens during the acute phase of infection. The lowest  concentration of SARS-CoV-2 viral copies this assay can detect is 250  copies / mL. A negative result does not preclude SARS-CoV-2 infection  and should not be used as the sole basis for treatment or other  patient management decisions.  A negative result may occur with  improper specimen collection / handling, submission of specimen other  than nasopharyngeal swab, presence of viral mutation(s) within the  areas targeted by this assay, and inadequate number of viral copies  (<250 copies / mL). A negative result must be combined with clinical  observations, patient history, and epidemiological information. If result is POSITIVE SARS-CoV-2 target nucleic acids are DETECTED. The SARS-CoV-2 RNA is generally detectable in upper  and lower  respiratory specimens dur ing the acute phase of infection.  Positive  results are indicative of active infection with SARS-CoV-2.  Clinical  correlation with patient history and other diagnostic information is  necessary to determine patient infection status.  Positive results do  not rule out bacterial infection or co-infection with other viruses. If result is PRESUMPTIVE POSTIVE SARS-CoV-2 nucleic acids MAY BE PRESENT.   A presumptive positive result was obtained on the submitted specimen  and confirmed on repeat testing.  While 2019 novel coronavirus  (SARS-CoV-2) nucleic acids may be present in the submitted sample  additional confirmatory testing may be necessary for epidemiological  and / or clinical management purposes  to differentiate between  SARS-CoV-2  and other Sarbecovirus currently known to infect humans.  If clinically indicated additional testing with an alternate test  methodology [phone]) is advised. The SARS-CoV-2 RNA is generally  detectable in upper and lower respiratory sp ecimens during the acute  phase of infection. The expected result is Negative. Fact Sheet for Patients:  StrictlyIdeas.no Fact Sheet for Healthcare Providers: BankingDealers.co.za This test is not yet approved or cleared by the [place] FDA and has been authorized for detection and/or diagnosis of SARS-CoV-2 by FDA under an Emergency Use Authorization (EUA).  This EUA will remain in effect (meaning this test can be used) for the duration of the COVID-19 declaration under Section 564(b)(1) of the Act, 21 U.S.C. section 360bbb-3(b)(1), unless the authorization is terminated or revoked sooner. Performed at [facility], [place] [address] [place], [place] [address]   Blood Culture (routine x 2)     Status: None   Collection Time: 06/03/19  9:41 PM   Specimen: BLOOD RIGHT HAND  Result Value Ref Range Status   Specimen  Description BLOOD RIGHT HAND  Final   Special Requests   Final    BOTTLES DRAWN AEROBIC AND ANAEROBIC Blood Culture results may not be optimal due to an inadequate volume of blood received in culture bottles   Culture   Final    NO GROWTH 5 DAYS Performed at [facility], [place] [address], [place], [place] [address]    Report Status 06/08/2019 FINAL  Final  Blood Culture (routine x 2)     Status: None   Collection Time: 06/03/19  9:44 PM   Specimen: BLOOD  Result Value Ref Range Status   Specimen Description BLOOD LEFT ANTECUBITAL  Final   Special Requests   Final    BOTTLES DRAWN AEROBIC AND ANAEROBIC Blood Culture adequate volume   Culture   Final    NO GROWTH 5 DAYS Performed at [facility], [place] [address], [place], [place] [address]    Report Status 06/08/2019 FINAL  Final  MRSA PCR Screening     Status: Abnormal   Collection Time: 06/04/19  2:29 AM   Specimen: Aptima Multi Swab; Nasopharyngeal  Result Value Ref Range Status   MRSA by PCR POSITIVE (A) NEGATIVE Final    Comment:        The GeneXpert MRSA Assay (FDA approved for NASAL specimens only), is one component of a comprehensive MRSA colonization surveillance program. It is not intended to diagnose MRSA infection nor to guide or monitor treatment for MRSA infections. RESULT CALLED TO, READ BACK BY AND VERIFIED WITH: A. TOPOLOKI,RN [id number] 06/04/2019 Mena Goes Performed at [facility], [place] [address], [place], [place] [address]   SARS Coronavirus 2 [facility] order, Performed in [facility] hospital lab)     Status: None   Collection Time: 06/04/19  2:29 AM  Result Value Ref Range Status   SARS Coronavirus 2 NEGATIVE NEGATIVE Final    Comment: (NOTE) If result is NEGATIVE SARS-CoV-2 target nucleic acids are NOT DETECTED. The SARS-CoV-2 RNA is generally detectable in upper and lower  respiratory specimens during the acute phase of infection. The lowest  concentration of SARS-CoV-2 viral copies this  assay can detect is 250  copies / mL. A negative result does not preclude SARS-CoV-2 infection  and should not be used as the sole basis for treatment or other  patient management decisions.  A negative result may occur with  improper specimen collection / handling, submission of specimen  other  than nasopharyngeal swab, presence of viral mutation(s) within the  areas targeted by this assay, and inadequate number of viral copies  (<250 copies / mL). A negative result must be combined with clinical  observations, patient history, and epidemiological information. If result is POSITIVE SARS-CoV-2 target nucleic acids are DETECTED. The SARS-CoV-2 RNA is generally detectable in upper and lower  respiratory specimens dur ing the acute phase of infection.  Positive  results are indicative of active infection with SARS-CoV-2.  Clinical  correlation with patient history and other diagnostic information is  necessary to determine patient infection status.  Positive results do  not rule out bacterial infection or co-infection with other viruses. If result is PRESUMPTIVE POSTIVE SARS-CoV-2 nucleic acids MAY BE PRESENT.   A presumptive positive result was obtained on the submitted specimen  and confirmed on repeat testing.  While 2019 novel coronavirus  (SARS-CoV-2) nucleic acids may be present in the submitted sample  additional confirmatory testing may be necessary for epidemiological  and / or clinical management purposes  to differentiate between  SARS-CoV-2 and other Sarbecovirus currently known to infect humans.  If clinically indicated additional testing with an alternate test  methodology [phone]) is advised. The SARS-CoV-2 RNA is generally  detectable in upper and lower respiratory sp ecimens during the acute  phase of infection. The expected result is Negative. Fact Sheet for Patients:  StrictlyIdeas.no Fact Sheet for Healthcare Providers:  BankingDealers.co.za This test is not yet approved or cleared by the [place] FDA and has been authorized for detection and/or diagnosis of SARS-CoV-2 by FDA under an Emergency Use Authorization (EUA).  This EUA will remain in effect (meaning this test can be used) for the duration of the COVID-19 declaration under Section 564(b)(1) of the Act, 21 U.S.C. section 360bbb-3(b)(1), unless the authorization is terminated or revoked sooner. Performed at [facility], [place] [address], [place], [place] [address]   Culture, sputum-assessment     Status: None   Collection Time: 06/04/19  4:35 AM   Specimen: Expectorated Sputum  Result Value Ref Range Status   Specimen Description EXPECTORATED SPUTUM  Final   Special Requests NONE  Final   Sputum evaluation   Final    THIS SPECIMEN IS ACCEPTABLE FOR SPUTUM CULTURE Performed at [facility], [place] [address], [place], [place] [address]    Report Status 06/05/2019 FINAL  Final  Culture, respiratory     Status: None   Collection Time: 06/04/19  4:35 AM  Result Value Ref Range Status   Specimen Description EXPECTORATED SPUTUM  Final   Special Requests NONE Reflexed from [id number]  Final   Gram Stain   Final    FEW WBC PRESENT, PREDOMINANTLY PMN MODERATE GRAM POSITIVE COCCI IN PAIRS IN CLUSTERS MODERATE GRAM NEGATIVE RODS RARE GRAM POSITIVE RODS    Culture   Final    MODERATE Consistent with normal respiratory flora. Performed at [facility], [place] [address], [place],  [address]    Report Status 06/07/2019 FINAL  Final  Respiratory Panel by PCR     Status: None   Collection Time: 06/06/19 10:34 AM   Specimen: Nasopharyngeal Swab; Respiratory  Result Value Ref Range Status   Adenovirus NOT DETECTED NOT DETECTED Final   Coronavirus 229E NOT DETECTED NOT DETECTED Final    Comment: (NOTE) The Coronavirus on the Respiratory Panel, DOES NOT test for the novel  Coronavirus (2019 nCoV)     Coronavirus HKU1 NOT DETECTED NOT DETECTED  Final   Coronavirus NL63 NOT DETECTED NOT DETECTED Final   Coronavirus OC43 NOT DETECTED NOT DETECTED Final   Metapneumovirus NOT DETECTED NOT DETECTED Final   Rhinovirus / Enterovirus NOT DETECTED NOT DETECTED Final   Influenza A NOT DETECTED NOT DETECTED Final   Influenza B NOT DETECTED NOT DETECTED Final   Parainfluenza Virus 1 NOT DETECTED NOT DETECTED Final   Parainfluenza Virus 2 NOT DETECTED NOT DETECTED Final   Parainfluenza Virus 3 NOT DETECTED NOT DETECTED Final   Parainfluenza Virus 4 NOT DETECTED NOT DETECTED Final   Respiratory Syncytial Virus NOT DETECTED NOT DETECTED Final   Bordetella pertussis NOT DETECTED NOT DETECTED Final   Chlamydophila pneumoniae NOT DETECTED NOT DETECTED Final   Mycoplasma pneumoniae NOT DETECTED NOT DETECTED Final    Comment: Performed at [facility], [place] [address], [place], [place] [address]     Labs: Basic Metabolic Panel: Recent Labs  Lab 06/04/19 0231 06/05/19 0827 06/06/19 0701 06/07/19 0415 06/08/19 0424  NA 138 140 140 137 136  K 3.7 3.8 3.9 4.2 4.6  CL 103 106 106 104 104  CO2 21* 21* 23 22 23   GLUCOSE 184* 166* 166* 265* 286*  BUN 35* 34* 25* 29* 40*  CREATININE 1.75* 1.66* 1.44* 1.29* 1.35*  CALCIUM 8.3* 8.4* 8.5* 8.7* 9.0  MG  --  1.4* 1.8 1.7 1.9  PHOS  --  2.2* 3.3 3.5 3.3   Liver Function Tests: Recent Labs  Lab 06/04/19 0617 06/05/19 0827 06/06/19 0701 06/07/19 0415 06/08/19 0424  AST 17 24 47* 43* 49*  ALT 13 15 32 43 62*  ALKPHOS 56 54 71 77 74  BILITOT 1.0 0.8 0.6 0.6 0.4  PROT 6.3* 6.0* 5.9* 6.1* 5.9*  ALBUMIN 2.8* 2.5* 2.3* 2.3* 2.2*   CBC: Recent Labs  Lab 06/04/19 0231 06/05/19 0827 06/06/19 0701 06/07/19 0415 06/08/19 0424  WBC 16.5* 15.1* 14.6* 13.4* 14.1*  NEUTROABS  --  12.4* 11.5* 11.3* 11.1*  HGB 9.0* 8.8* 9.0* 8.9* 9.2*  HCT 27.3* 26.7* 27.2* 26.4* 28.0*  MCV 89.2 87.0 87.7 86.0 87.0  PLT 210 205 226 235 306    Recent Labs     06/03/19 1726  BNP 91.3    CBG: Recent Labs  Lab 06/07/19 1136 06/07/19 1649 06/07/19 2045 06/08/19 0801 06/08/19 1122  GLUCAP 283* 356* 445* 201* 231*    Principal Problem:   SIRS (systemic inflammatory response syndrome) (HCC) Active Problems:   HTN (hypertension)   CKD (chronic kidney disease) stage 3, GFR 30-59 ml/min (HCC)   Diabetes mellitus type 2 with complications (HCC)   CAP (community acquired pneumonia)   ARF (acute renal failure) ([place])   AKI (acute kidney injury) ([place])   Time coordinating discharge: 45 minutes  Signed:  Murray Hodgkins, MD  [facility]  06/08/2019, 5:33 PM

## 2019-06-08 NOTE — Care Management Important Message (Signed)
Important Message  Patient Details  Name: Christian [name] MRN: [id number] Date of Birth: [birth year]   Medicare Important Message Given:  Yes     [name] 06/08/2019, 1:45 PM

## 2019-06-08 NOTE — Progress Notes (Signed)
Physical Therapy Treatment Patient Details Name: Christian [name] MRN: [id number] DOB: [birth year] Today's Date: 06/08/2019    History of Present Illness Patient is an 84 y.o. male with PMH: DM-2, HTN, chronic kidney disease stage III, HLD, who has been experiencing SOB with productive cough for past week prior to hospitalization. He was referred to ER by his PCP. In ER he was febrile with temperature at 103 degrees F, CXR showed congestion vs infiltrates. Covid-19 test completed twice and both were negative. Patient was started on empiric antibiotics for community acquired PNA.     PT Comments    Patient much improved with mobility and activity tolerance.  Able to ambulate 300' with RW and S.  Does shuffle his feet, but may be at baseline with this.  Feel he is stable for home with family support (will have nephew in the evening and son during the day to assist.  Recommend follow up HHPT for safety with mobility in the home.     Follow Up Recommendations  Home health PT     Equipment Recommendations  Rolling walker with 5" wheels;3in1 (PT)    Recommendations for Other Services       Precautions / Restrictions Precautions Precautions: Fall Precaution Comments: watch 02    Mobility  Bed Mobility               General bed mobility comments: up in chair  Transfers   Equipment used: Rolling walker (2 wheeled) Transfers: Sit to/from Stand Sit to Stand: Supervision         General transfer comment: increased time up from low recliner with heavy UE support on armrests  Ambulation/Gait Ambulation/Gait assistance: Supervision Gait Distance (Feet): 300 Feet Assistive device: Rolling walker (2 wheeled) Gait Pattern/deviations: Step-through pattern;Trunk flexed;Shuffle;Decreased stride length     General Gait Details: SpO2 registering low on RA with poor pleth, but once loosened grip on RW registering in 90's   Stairs             Wheelchair Mobility    Modified  Rankin (Stroke Patients Only)       Balance   Sitting-balance support: Feet supported Sitting balance-Leahy Scale: Good Sitting balance - Comments: sitting edge of chair     Standing balance-Leahy Scale: Fair Standing balance comment: standing without UE support with S                            Cognition Arousal/Alertness: Awake/alert Behavior During Therapy: WFL for tasks assessed/performed Overall Cognitive Status: Within Functional Limits for tasks assessed                                        Exercises      General Comments        Pertinent Vitals/Pain Pain Assessment: No/denies pain    Home Living                      Prior Function            PT Goals (current goals can now be found in the care plan section) Progress towards PT goals: Progressing toward goals    Frequency    Min 3X/week      PT Plan Discharge plan needs to be updated;Frequency needs to be updated    Co-evaluation  AM-PAC PT "6 Clicks" Mobility   Outcome Measure  Help needed turning from your back to your side while in a flat bed without using bedrails?: None Help needed moving from lying on your back to sitting on the side of a flat bed without using bedrails?: None Help needed moving to and from a bed to a chair (including a wheelchair)?: None Help needed standing up from a chair using your arms (e.g., wheelchair or bedside chair)?: None Help needed to walk in hospital room?: A Little Help needed climbing 3-5 steps with a railing? : A Little 6 Click Score: 22    End of Session   Activity Tolerance: Patient tolerated treatment well Patient left: in chair;with call bell/phone within reach;with chair alarm set   PT Visit Diagnosis: Unsteadiness on feet (R26.81);Other abnormalities of gait and mobility (R26.89);Muscle weakness (generalized) (M62.81)     Time: 1442-1500 PT Time Calculation (min) (ACUTE ONLY): 18  min  Charges:  $Gait Training: 8-22 mins                     Magda Kiel, [place] [phone] 06/08/2019    Reginia Naas 06/08/2019, 4:58 PM

## 2019-06-08 NOTE — Plan of Care (Signed)
  Problem: Education: Goal: Knowledge of General Education information will improve Description: Including pain rating scale, medication(s)/side effects and non-pharmacologic comfort measures Outcome: Progressing   Problem: Clinical Measurements: Goal: Ability to maintain clinical measurements within normal limits will improve Outcome: Progressing Goal: Respiratory complications will improve Outcome: Progressing   Problem: Activity: Goal: Risk for activity intolerance will decrease Outcome: Progressing   Problem: Nutrition: Goal: Adequate nutrition will be maintained Outcome: Progressing   Problem: Pain Managment: Goal: General experience of comfort will improve Outcome: Progressing

## 2019-06-08 NOTE — Progress Notes (Signed)
Occupational Therapy Treatment Patient Details Name: Christian [name] MRN: [id number] DOB: 13-Sep-1935 Today's Date: 06/08/2019    History of present illness Patient is an 84 y.o. male with PMH: DM-2, HTN, chronic kidney disease stage III, HLD, who has been experiencing SOB with productive cough for past week prior to hospitalization. He was referred to ER by his PCP. In ER he was febrile with temperature at 103 degrees F, CXR showed congestion vs infiltrates. Covid-19 test completed twice and both were negative. Patient was started on empiric antibiotics for community acquired PNA.    OT comments  Pt progressing well. Pt with increased independence today; shower transfer with supervisionA; standing at sink for grooming tasks with supervisionA and bending over to perform LB dressing in sitting with supervisionA. Pt ambulating 75' in room with RW with supervisionA to minguardA based on fatigue level. Pt advised to obtain a BSC to use in shower and over commode as pt is more comfortable sitting down for ADL. Pt with a great improvement and could return home with heavy supervisionA from family throughout the day to assure his needs are being met. Pt would benefit from additional energy conservation techniques acutely. OT following.  Pt's O2 >90% throughout session.      Follow Up Recommendations  Home health OT;Supervision - Intermittent(heavy supervisionA for first few days )    Equipment Recommendations  3 in 1 bedside commode    Recommendations for Other Services      Precautions / Restrictions Precautions Precautions: Fall;Other (comment) Precaution Comments: watch 02 Restrictions Weight Bearing Restrictions: No       Mobility Bed Mobility Overal bed mobility: Needs Assistance Bed Mobility: Supine to Sit     Supine to sit: Supervision     General bed mobility comments: for safety  Transfers Overall transfer level: Needs assistance Equipment used: Rolling walker (2  wheeled) Transfers: Sit to/from Stand Sit to Stand: Supervision         General transfer comment: assist with initial standing balance    Balance Overall balance assessment: Needs assistance Sitting-balance support: Bilateral upper extremity supported;Feet supported Sitting balance-Leahy Scale: Poor     Standing balance support: Bilateral upper extremity supported;During functional activity Standing balance-Leahy Scale: Fair Standing balance comment: Pt able transfer in/out of shower; stand at sink; and ambulating in room ~75' with RW supervisionA.                           ADL either performed or assessed with clinical judgement   ADL Overall ADL's : Needs assistance/impaired     Grooming: Standing;Supervision/safety   Upper Body Bathing: Set up;Sitting   Lower Body Bathing: Supervison/ safety;Set up;Sitting/lateral leans;Sit to/from stand;Cueing for safety Lower Body Bathing Details (indicate cue type and reason): In sitting, pt able to lean over an eneergy conservation technique education provided. Upper Body Dressing : Set up;Sitting   Lower Body Dressing: Supervision/safety;Set up;Sitting/lateral leans;Sit to/from stand   Toilet Transfer: Supervision/safety;Ambulation;Grab bars;RW   Toileting- Water quality scientist and Hygiene: Supervision/safety;Set up Duc[place] Manipulation Details (indicate cue type and reason): pt may require rest breaks seated at commode Tub/ Shower Transfer: Walk-in shower;Min guard;Cueing for safety;3 in 1 Tub/Shower Transfer Details (indicate cue type and reason): Pt was able to negotiate a walk-in shower with ~3 inch ledge using grab bars and seated on BSC.  Functional mobility during ADLs: Min guard;Rolling walker;Cueing for safety General ADL Comments: Pt with increased independence today; shower transfer with supervisionA; standing  at sink for grooming tasks with supervisionA and bending over to perform LB dressing in  sitting with supervisionA     Vision   Vision Assessment?: No apparent visual deficits   Perception     Praxis      Cognition Arousal/Alertness: Awake/alert Behavior During Therapy: WFL for tasks assessed/performed Overall Cognitive Status: Within Functional Limits for tasks assessed                                          Exercises     Shoulder Instructions       General Comments 3' with Rw and performing ADL tasks >90% on RA.    Pertinent Vitals/ Pain       Pain Assessment: No/denies pain  Home Living                                          Prior Functioning/Environment              Frequency  Min 2X/week        Progress Toward Goals  OT Goals(current goals can now be found in the care plan section)  Progress towards OT goals: Progressing toward goals  Acute Rehab OT Goals Patient Stated Goal: to go home OT Goal Formulation: With patient Time For Goal Achievement: 06/19/19 Potential to Achieve Goals: Good ADL Goals Pt Will Perform Grooming: with modified independence;standing Pt Will Perform Upper Body Dressing: with modified independence;sitting Pt Will Perform Lower Body Dressing: with modified independence;sitting/lateral leans;sit to/from stand Pt Will Transfer to Toilet: with modified independence;ambulating Pt Will Perform Toileting - Clothing Manipulation and hygiene: with modified independence;sitting/lateral leans;sit to/from stand Additional ADL Goal #1: Pt will perform x10 mins of OOB ADL with supervision levelA  Plan Discharge plan needs to be updated    Co-evaluation                 AM-PAC OT "6 Clicks" Daily Activity     Outcome Measure   Help from another person eating meals?: None Help from another person taking care of personal grooming?: None Help from another person toileting, which includes using toliet, bedpan, or urinal?: A Little Help from another person bathing (including  washing, rinsing, drying)?: A Little Help from another person to put on and taking off regular upper body clothing?: None Help from another person to put on and taking off regular lower body clothing?: A Little 6 Click Score: 21    End of Session Equipment Utilized During Treatment: Gait belt;Rolling walker  OT Visit Diagnosis: Muscle weakness (generalized) (M62.81)   Activity Tolerance Patient limited by fatigue;Patient tolerated treatment well   Patient Left in chair;with call bell/phone within reach;with chair alarm set   Nurse Communication Mobility status        Time: 2979-8921 OT Time Calculation (min): 25 min  Charges: OT General Charges $OT Visit: 1 Visit OT Evaluation $OT Eval Moderate Complexity: 1 Mod OT Treatments $Self Care/Home Management : 8-22 mins  Darryl Nestle) Marsa Aris OTR/L Acute Rehabilitation Services Pager: [phone] Office: [place] 06/08/2019, 1:52 PM

## 2019-06-15 ENCOUNTER — Other Ambulatory Visit: Payer: Self-pay | Admitting: Family Medicine

## 2019-06-20 ENCOUNTER — Ambulatory Visit (INDEPENDENT_AMBULATORY_CARE_PROVIDER_SITE_OTHER): Payer: PPO

## 2019-06-20 ENCOUNTER — Other Ambulatory Visit: Payer: Self-pay

## 2019-06-20 DIAGNOSIS — Z23 Encounter for immunization: Secondary | ICD-10-CM

## 2019-06-20 NOTE — Progress Notes (Signed)
Patient came in today to receive annual flu vaccine. Patient was given influenza vaccine in the left deltoid. Patient tolerated well. VIS was given.

## 2019-06-29 ENCOUNTER — Other Ambulatory Visit: Payer: Self-pay | Admitting: Family Medicine

## 2019-06-29 NOTE — Telephone Encounter (Signed)
Patient requesting a refill on his hydrocod/acetam7.5-325 mg called into [facility]. He also needs a new glucose meter he has misplaced his.  CB# [phone]

## 2019-06-30 MED ORDER — BLOOD GLUCOSE SYSTEM PAK KIT: PACK | 1 refills | Status: AC

## 2019-06-30 MED ORDER — HYDROCODONE-ACETAMINOPHEN 7.5-325 MG PO TABS: 1.0000 | ORAL_TABLET | Freq: Four times a day (QID) | ORAL | 0 refills | Status: AC | PRN

## 2019-06-30 NOTE — Telephone Encounter (Signed)
Glucometer sent to pharmacy.   Hydrocodone/APAP is no longer on medication list.   Per PMP Aware, last refill on Hydrocodone/APAP 7.5/325mg  noted on 02/24/2019 #30 tabs.   Patient has had pain medications from [facility] as follows: 04/14/2019: Oxycodone 5mg  #30 tabs 05/06/2019: tramadol 50mg  #40 tabs.   Ok to refill Hydrocodone/APAP??  Last office visit 06/03/2019.

## 2019-07-29 ENCOUNTER — Telehealth: Payer: Self-pay | Admitting: Family Medicine

## 2019-07-29 NOTE — Telephone Encounter (Signed)
Patient is calling to say he is out of his lantus completley  Would like to know if it can be called into the [facility]

## 2019-08-01 ENCOUNTER — Telehealth: Payer: Self-pay | Admitting: Family Medicine

## 2019-08-01 MED ORDER — LANTUS SOLOSTAR 100 UNIT/ML ~~LOC~~ SOPN: PEN_INJECTOR | SUBCUTANEOUS | 3 refills | Status: AC

## 2019-08-01 NOTE — Telephone Encounter (Signed)
EMS called.  They were called to the home after he was having chest pain for 24 hours.  He went into cardiac arrest while they were there, CPR was started.  He went into PEA he had advanced cardiac resuscitation attempted in the field.  He subsequently died.  They called around 4 AM on 07/12/2019 Advised PCP would fill out the death certificate

## 2019-08-01 NOTE — Telephone Encounter (Signed)
Medication called/sent to requested pharmacy  

## 2019-08-07 DEATH — deceased

## 2020-02-23 IMAGING — DX PORTABLE RIGHT KNEE - 1-2 VIEW
2 series · 2 of 2 positions shown · non-contrast
Comparison: None.

CLINICAL DATA: Degenerative arthrosis of the right knee.

EXAM:
PORTABLE RIGHT KNEE - 1-2 VIEW

[knee ap]
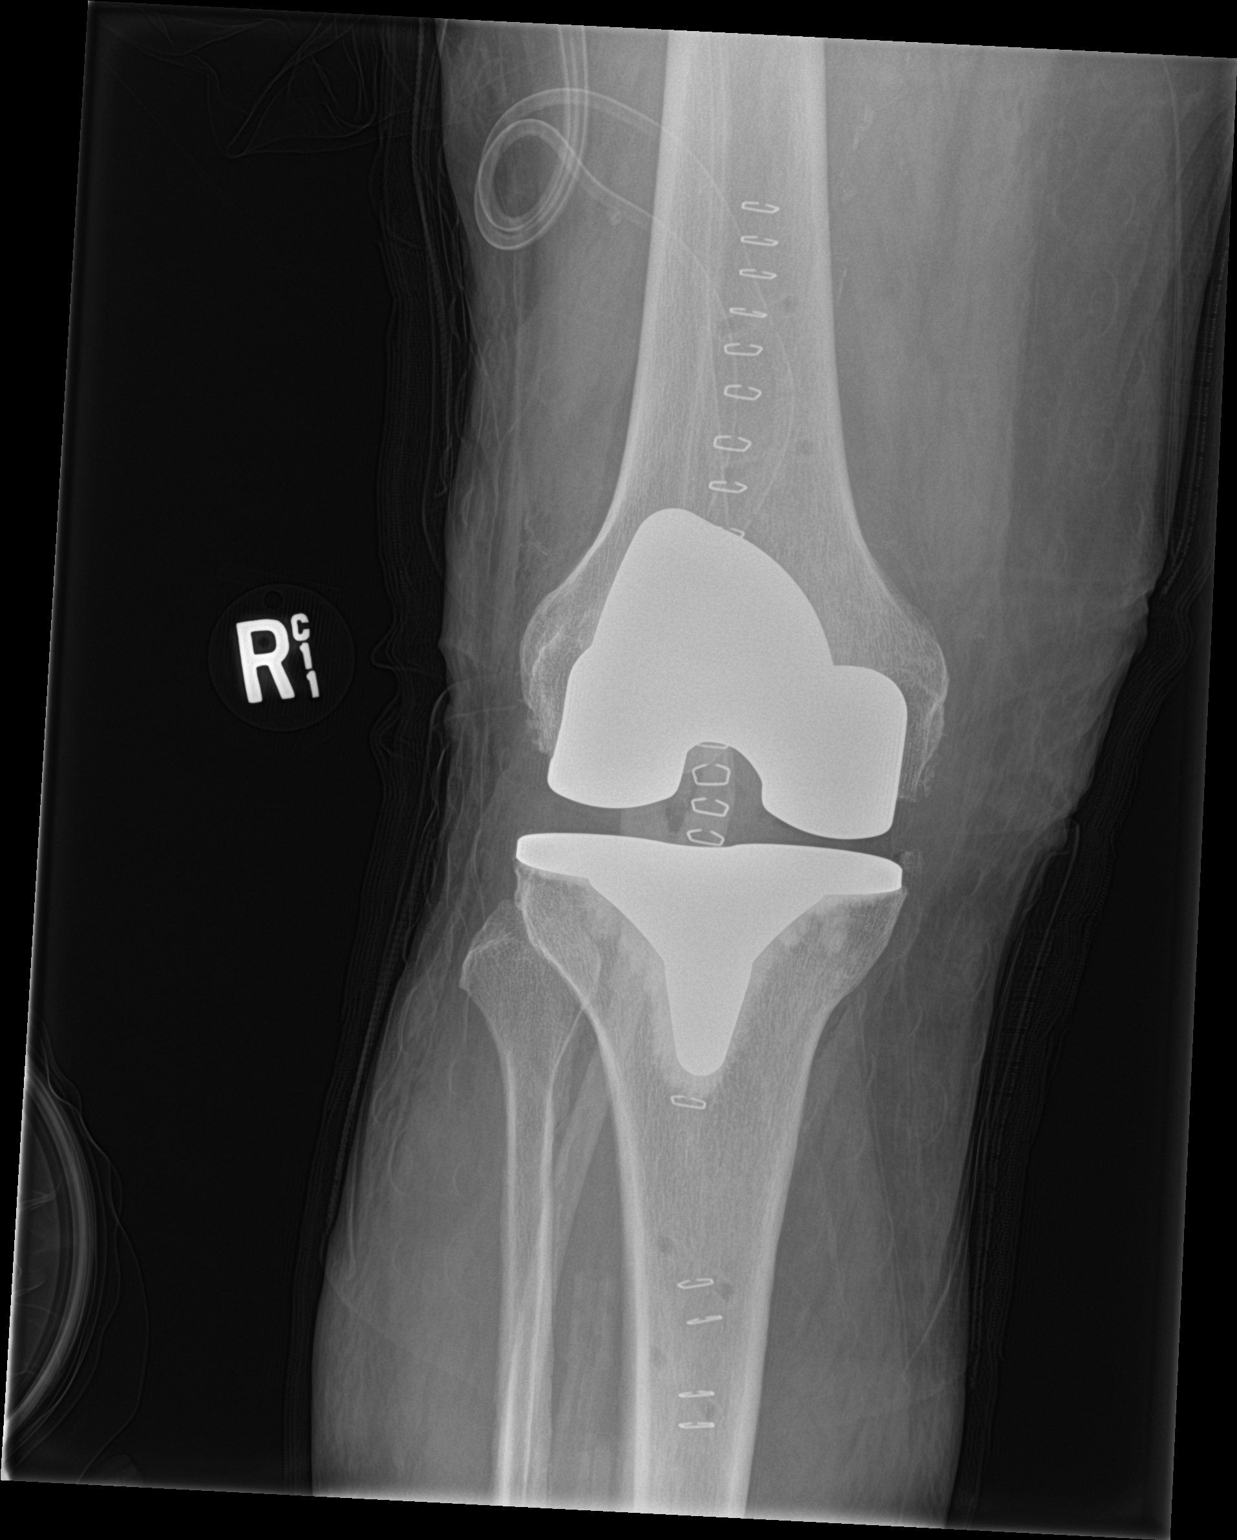

[knee lat]
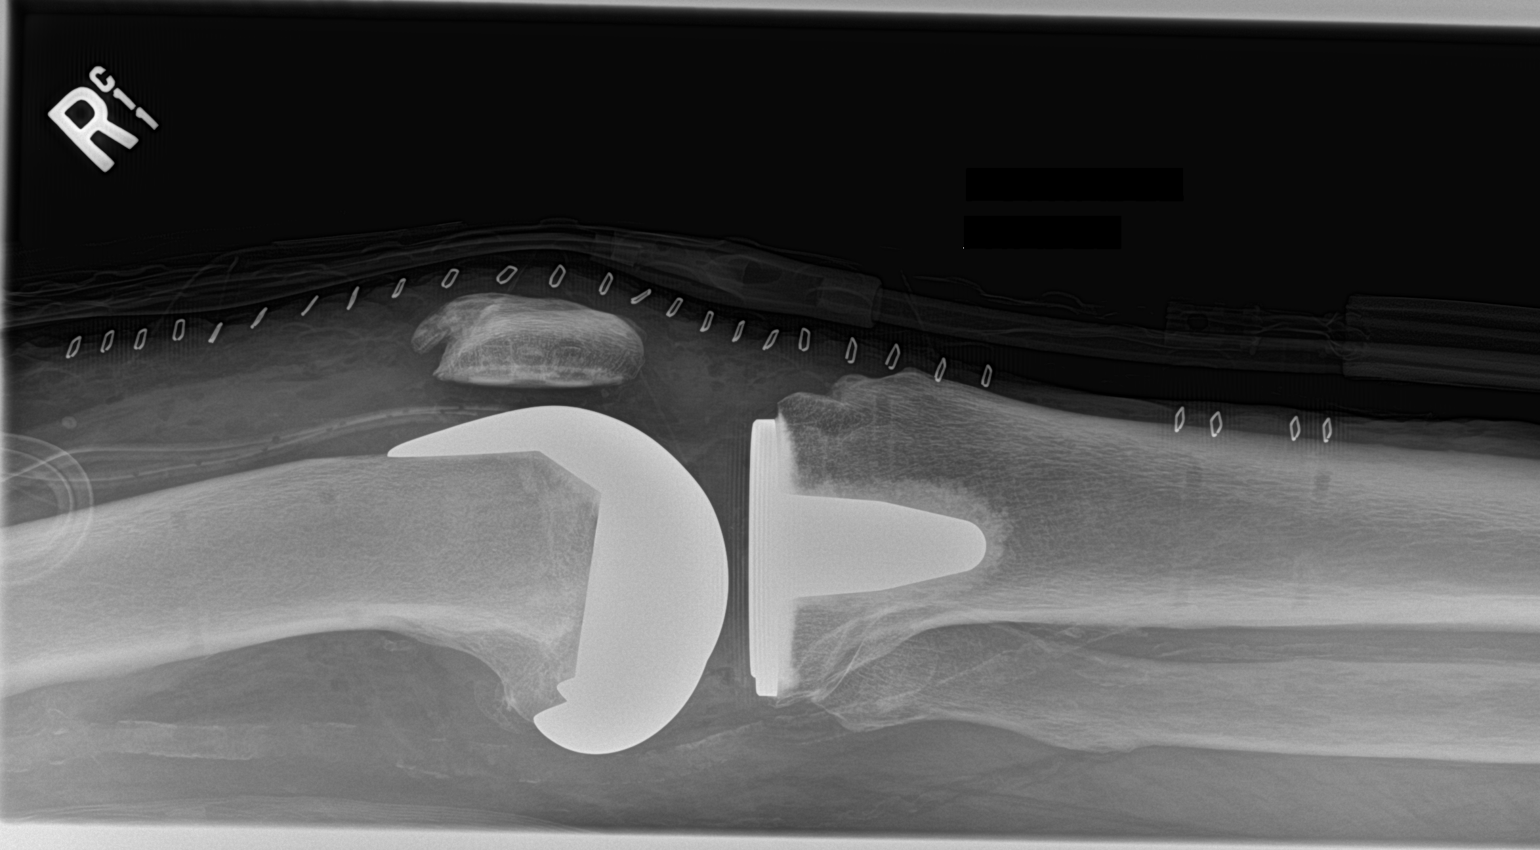

[2 of 2 positions shown; findings below may reference images not displayed]

FINDINGS: The patient has undergone a right total knee replacement. The
components of the prosthesis appear in excellent position. Joint
drains in place.
IMPRESSION: Satisfactory appearance of the right knee after total knee
replacement.

## 2020-04-18 IMAGING — DX PORTABLE CHEST - 1 VIEW
1 series · 1 of 1 positions shown · non-contrast
Comparison: Chest x-ray dated June 03, 2019.

CLINICAL DATA: Shortness of breath.

EXAM:
PORTABLE CHEST 1 VIEW

[chest]
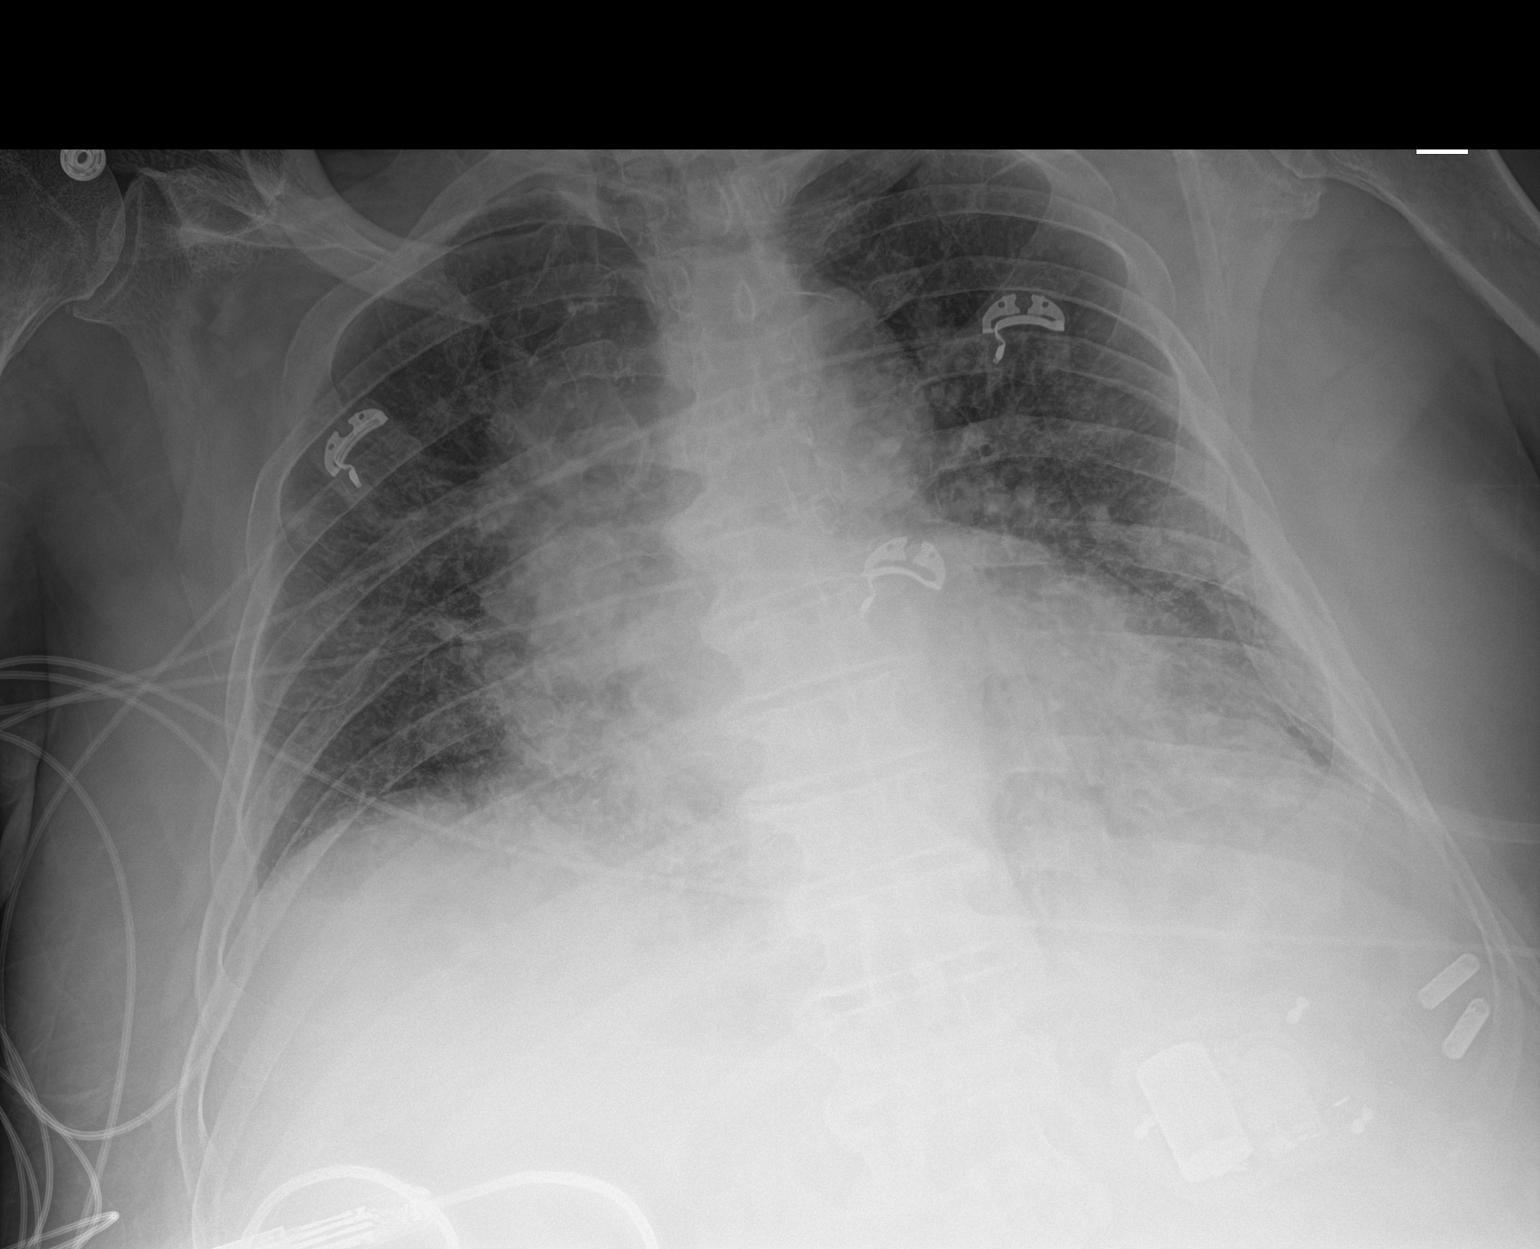

[1 of 1 positions shown; findings below may reference images not displayed]

FINDINGS: The patient is rotated to the right. Stable cardiomegaly. Bilateral
perihilar and lower lobe interstitial opacities have mildly
increased. Low lung volumes with mild bibasilar atelectasis. No
pleural effusion or pneumothorax. No acute osseous abnormality.
IMPRESSION: 1. Bilateral perihilar and lower lobe interstitial opacities which
could reflect edema or infection appear mildly progressed, although
this could simply reflect increased bronchovascular crowding due to
lower lung volumes when compared to prior study.
